# Patient Record
Sex: Male | Born: 1960 | Race: White | Hispanic: No | Marital: Married | Smoking: Current every day smoker
Health system: Southern US, Community
[De-identification: ages and names within clinical notes are randomized; demographics above are authoritative.]

## PROBLEM LIST (undated history)

## (undated) DIAGNOSIS — X838XXA Intentional self-harm by other specified means, initial encounter: Secondary | ICD-10-CM

## (undated) DIAGNOSIS — K649 Unspecified hemorrhoids: Secondary | ICD-10-CM

## (undated) DIAGNOSIS — F32A Depression, unspecified: Secondary | ICD-10-CM

## (undated) DIAGNOSIS — M1990 Unspecified osteoarthritis, unspecified site: Secondary | ICD-10-CM

## (undated) DIAGNOSIS — E039 Hypothyroidism, unspecified: Secondary | ICD-10-CM

## (undated) DIAGNOSIS — F419 Anxiety disorder, unspecified: Secondary | ICD-10-CM

## (undated) DIAGNOSIS — G4733 Obstructive sleep apnea (adult) (pediatric): Secondary | ICD-10-CM

## (undated) DIAGNOSIS — T7840XA Allergy, unspecified, initial encounter: Secondary | ICD-10-CM

## (undated) DIAGNOSIS — R569 Unspecified convulsions: Secondary | ICD-10-CM

## (undated) DIAGNOSIS — R519 Headache, unspecified: Secondary | ICD-10-CM

## (undated) DIAGNOSIS — F329 Major depressive disorder, single episode, unspecified: Secondary | ICD-10-CM

## (undated) DIAGNOSIS — Z9989 Dependence on other enabling machines and devices: Secondary | ICD-10-CM

## (undated) DIAGNOSIS — J189 Pneumonia, unspecified organism: Secondary | ICD-10-CM

## (undated) DIAGNOSIS — I10 Essential (primary) hypertension: Secondary | ICD-10-CM

## (undated) HISTORY — DX: Hypothyroidism, unspecified: E03.9

## (undated) HISTORY — DX: Unspecified convulsions: R56.9

## (undated) HISTORY — DX: Allergy, unspecified, initial encounter: T78.40XA

## (undated) HISTORY — DX: Dependence on other enabling machines and devices: Z99.89

## (undated) HISTORY — DX: Major depressive disorder, single episode, unspecified: F32.9

## (undated) HISTORY — PX: KNEE CARTILAGE SURGERY: SHX688

## (undated) HISTORY — PX: BICEPS TENDON REPAIR: SHX566

## (undated) HISTORY — DX: Essential (primary) hypertension: I10

## (undated) HISTORY — DX: Unspecified hemorrhoids: K64.9

## (undated) HISTORY — PX: KNEE SURGERY: SHX244

## (undated) HISTORY — DX: Depression, unspecified: F41.9

## (undated) HISTORY — DX: Depression, unspecified: F32.A

## (undated) HISTORY — PX: CARPAL TUNNEL RELEASE: SHX101

## (undated) HISTORY — DX: Obstructive sleep apnea (adult) (pediatric): G47.33

---

## 1998-04-29 ENCOUNTER — Ambulatory Visit (HOSPITAL_BASED_OUTPATIENT_CLINIC_OR_DEPARTMENT_OTHER): Admission: RE | Admit: 1998-04-29 | Discharge: 1998-04-29 | Payer: Self-pay | Admitting: *Deleted

## 2004-07-12 ENCOUNTER — Ambulatory Visit (HOSPITAL_COMMUNITY): Admission: RE | Admit: 2004-07-12 | Discharge: 2004-07-12 | Payer: Self-pay | Admitting: Family Medicine

## 2004-07-12 IMAGING — CR DG CHEST 2V
2 series · 2 of 2 positions shown · non-contrast
Comparison: none

HISTORY: Acute bronchitis, cough, fever

CHEST 2 VIEWS:
No prior studies for comparison
Normal heart size, mediastinal contours, and vascularity.
Mild/moderate bronchitic changes without infiltrate or effusion.
No pneumothorax or pulmonary mass seen.
Bones unremarkable.

[view not recorded (1 of 2)]
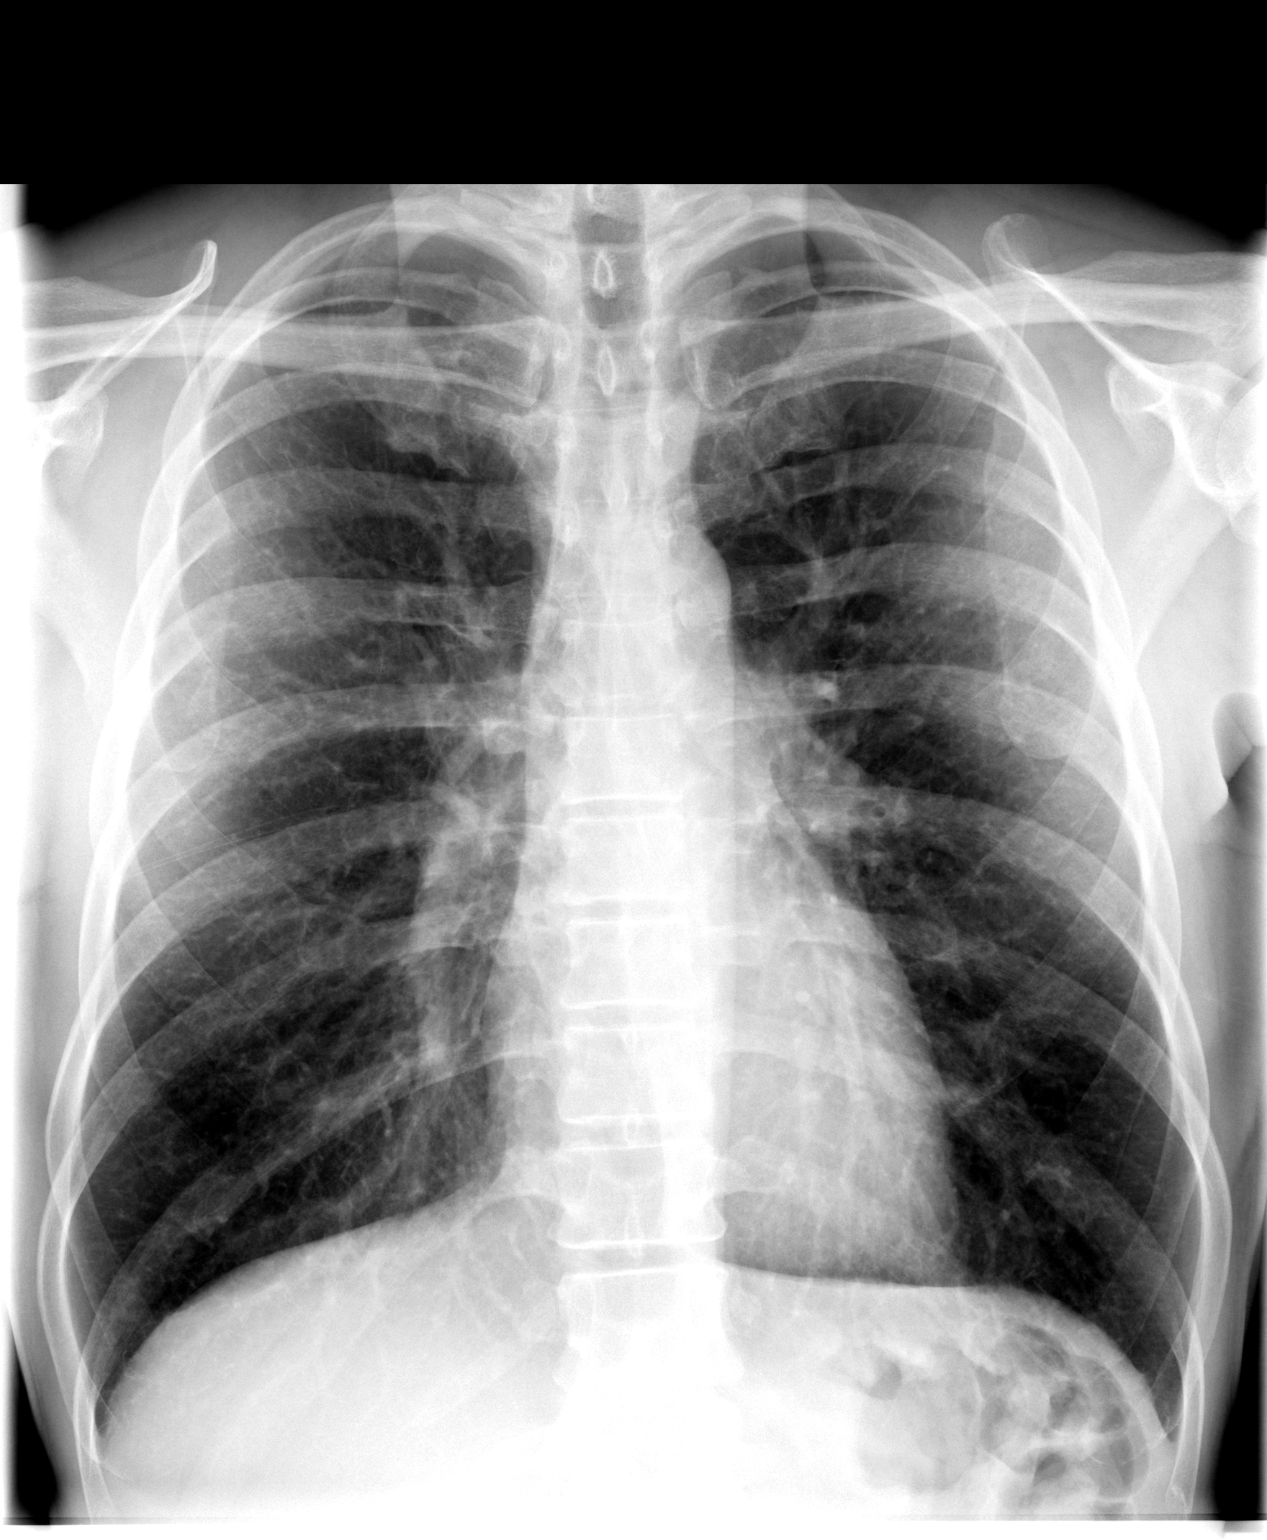

[view not recorded (2 of 2)]
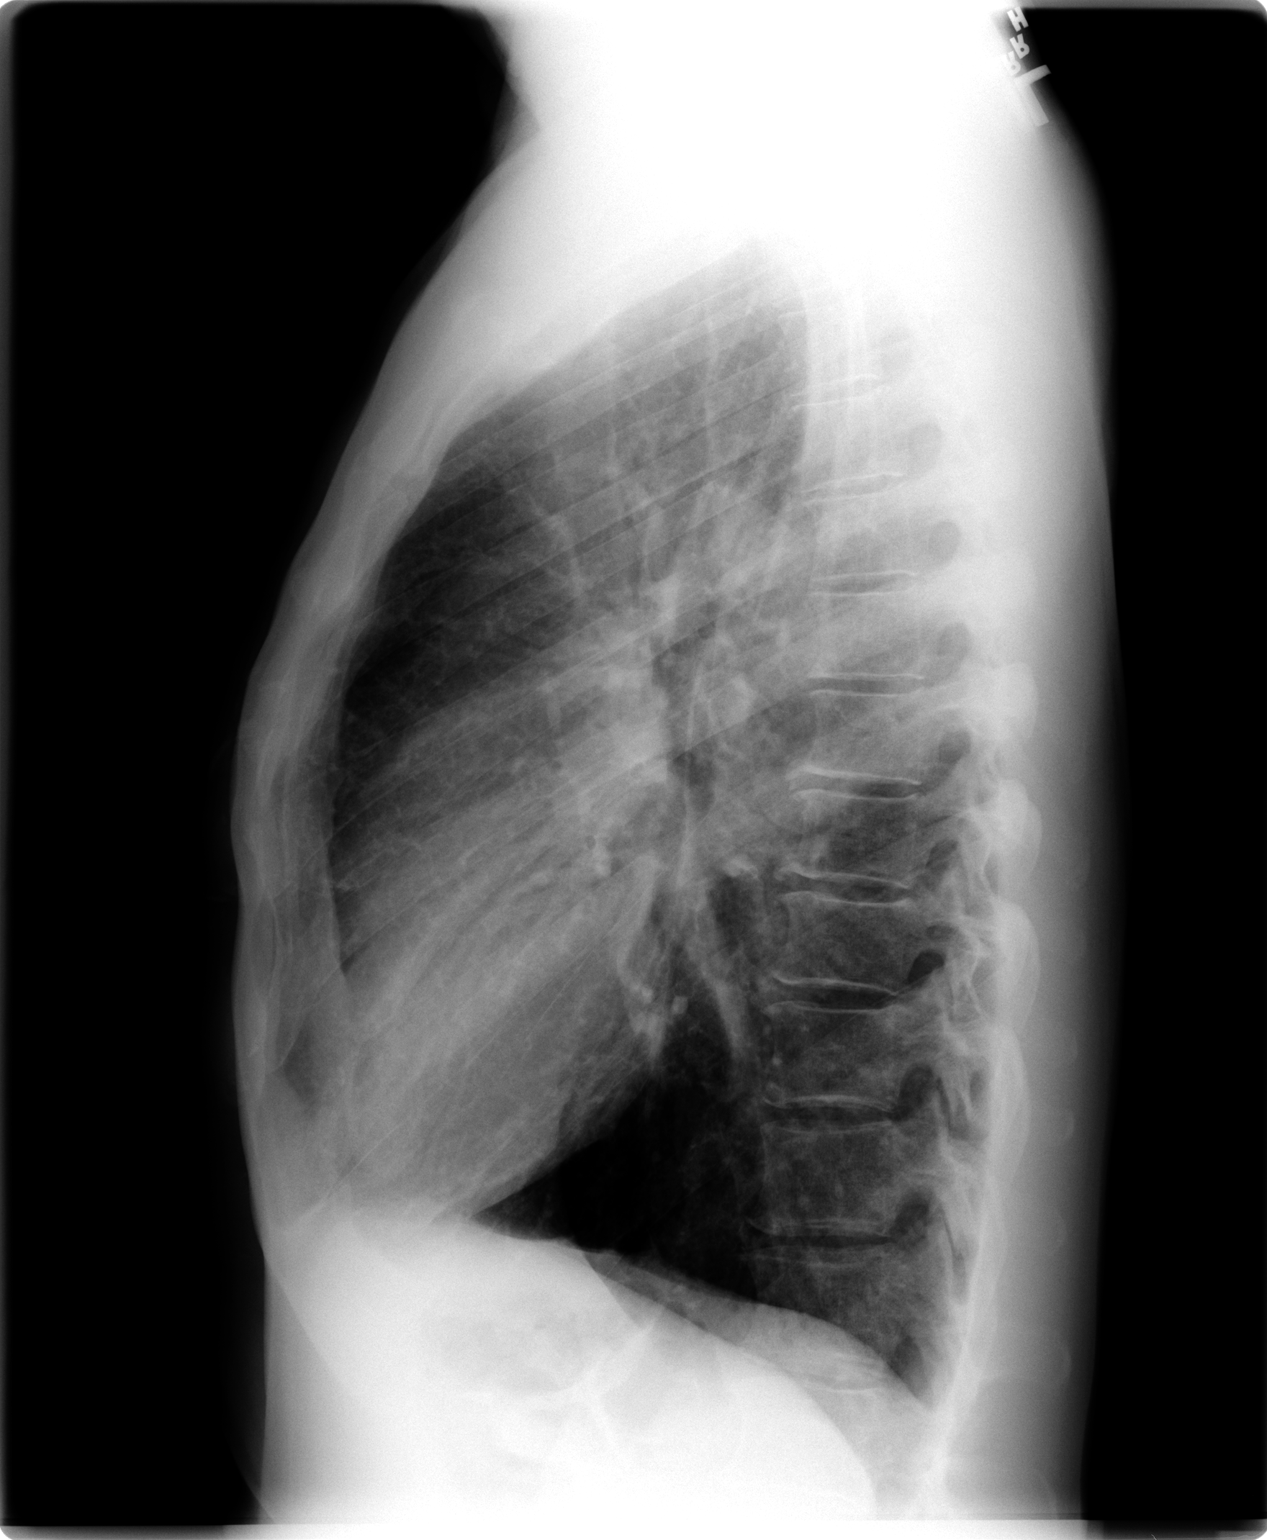

[2 of 2 positions shown; findings below may reference images not displayed]

IMPRESSION: Bronchitic changes.

## 2009-08-16 ENCOUNTER — Ambulatory Visit (HOSPITAL_BASED_OUTPATIENT_CLINIC_OR_DEPARTMENT_OTHER): Admission: RE | Admit: 2009-08-16 | Discharge: 2009-08-16 | Payer: Self-pay | Admitting: Orthopedic Surgery

## 2010-02-04 ENCOUNTER — Ambulatory Visit (HOSPITAL_BASED_OUTPATIENT_CLINIC_OR_DEPARTMENT_OTHER): Admission: RE | Admit: 2010-02-04 | Discharge: 2010-02-04 | Payer: Self-pay | Admitting: Orthopedic Surgery

## 2010-03-01 ENCOUNTER — Encounter (INDEPENDENT_AMBULATORY_CARE_PROVIDER_SITE_OTHER): Payer: Self-pay | Admitting: *Deleted

## 2010-03-04 ENCOUNTER — Encounter (INDEPENDENT_AMBULATORY_CARE_PROVIDER_SITE_OTHER): Payer: Self-pay | Admitting: *Deleted

## 2010-03-07 ENCOUNTER — Ambulatory Visit: Payer: Self-pay | Admitting: Gastroenterology

## 2010-03-14 ENCOUNTER — Ambulatory Visit: Payer: Self-pay | Admitting: Gastroenterology

## 2010-03-17 ENCOUNTER — Encounter: Payer: Self-pay | Admitting: Gastroenterology

## 2010-09-06 NOTE — Letter (Signed)
Summary: Results Letter  [facility]  [address] [place], [place] [address]   Phone: [phone]  Fax: [phone]        March 17, 2010 MRN: [id number]    [name] [address] [facility] [address], [place]  [address]    Dear [name],   At least one of the polyps removed during your recent procedure was proven to be adenomatous.  These are pre-cancerous polyps that may have grown into cancers if they had not been removed.  Based on current nationally recognized surveillance guidelines, I recommend that you have a repeat colonoscopy in 5 years.  We will therefore put your information in our reminder system and will contact you in 5 years to schedule a repeat procedure.  Please call if you have any questions or concerns.       Sincerely,  Rachael Fee MD  This letter has been electronically signed by your physician.  Appended Document: Results Letter letter mailed

## 2010-09-06 NOTE — Miscellaneous (Signed)
Summary: previsit/rm  Clinical Lists Changes  Medications: Added new medication of MOVIPREP 100 GM  SOLR (PEG-KCL-NACL-NASULF-NA ASC-C) As per prep instructions. - Signed Rx of MOVIPREP 100 GM  SOLR (PEG-KCL-NACL-NASULF-NA ASC-C) As per prep instructions.;  #1 x 0;  Signed;  Entered by: Sherren Kerns RN;  Authorized by: Rachael Fee MD;  Method used: [facility]  [place] [address], [place], [place]  [address], Ph: [phone] or [phone], Fax: [phone] Observations: Added new observation of ALLERGY REV: Done (03/07/2010 10:53) Added new observation of NKA: T (03/07/2010 10:53)    Prescriptions: MOVIPREP 100 GM  SOLR (PEG-KCL-NACL-NASULF-NA ASC-C) As per prep instructions.  #1 x 0   Entered by:   Sherren Kerns RN   Authorized by:   Rachael Fee MD   Signed by:   Sherren Kerns RN on 03/07/2010   Method used:   [facility] to        [facility]  [place] [address] (retail)       [address]       [place], [place]  [address]       Ph: [phone] or [phone]       Fax: [phone]   RxID:   [id number]

## 2010-09-06 NOTE — Procedures (Signed)
Summary: Colonoscopy  Patient: [name] Note: All result statuses are Final unless otherwise noted.  Tests: (1) Colonoscopy (COL)   COL Colonoscopy           DONE     [facility]     [address]     [place], [place]  [address]           COLONOSCOPY PROCEDURE REPORT           PATIENT:  [name], [name]  MR#:  [id number]     BIRTHDATE:  07-Apr-1961, 49 yrs. old  GENDER:  male     ENDOSCOPIST:  Rachael Fee, MD     REF. BY:  Talbot Grumbling. Creta Levin, M.D.     PROCEDURE DATE:  03/14/2010     PROCEDURE:  Colonoscopy with biopsy and snare polypectomy     ASA CLASS:  Class II     INDICATIONS:  Routine Risk Screening     MEDICATIONS:   Fentanyl 75 mcg IV, Versed 6 mg IV           DESCRIPTION OF PROCEDURE:   After the risks benefits and     alternatives of the procedure were thoroughly explained, informed     consent was obtained.  Digital rectal exam was performed and     revealed no rectal masses.   The LB [id number] endoscope     was introduced through the anus and advanced to the cecum, which     was identified by both the appendix and ileocecal valve, without     limitations.  The quality of the prep was good, using MoviPrep.     The instrument was then slowly withdrawn as the colon was fully     examined.           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  There were     multiple polyps identified and removed. Two sessile polyps were     found, removed from colon. One was 1-68mm across, located in cecum,     removed with forceps. The other was 5mm across, located in     ascending colon, removed with cold snare. Both were sent to     pathology (jar 1) (see image4 and image2).  External hemorrhoids     were found.  This was otherwise a normal examination of the colon     (see image1, image3, and image6).   Retroflexed views in the     rectum revealed no abnormalities.    The scope was then withdrawn     from the  patient and the procedure completed.           COMPLICATIONS:  None           ENDOSCOPIC IMPRESSION:     1) Melanosis staining of colon mucosa     2) Two small polyps, both removed and sent to pathology     3) External hemorrhoids     4) Otherwise normal examination           RECOMMENDATIONS:     1) If the polyp(s) removed today are proven to be adenomatous     (pre-cancerous) polyps, you will need a repeat colonoscopy in 5     years. Otherwise you should continue to follow colorectal cancer     screening guidelines for "routine risk" patients with colonoscopy     in 10 years.  2) You will receive a letter within 1-2 weeks with the results     of your biopsy as well as final recommendations. Please call my     office if you have not received a letter after 3 weeks.           ______________________________     Rachael Fee, MD           n.     eSIGNED:   Rachael Fee at 03/14/2010 03:06 PM           Raquel James, [id number]  Note: An exclamation mark (!) indicates a result that was not dispersed into the flowsheet. Document Creation Date: 03/14/2010 3:07 PM _______________________________________________________________________  (1) Order result status: Final Collection or observation date-time: 03/14/2010 15:01 Requested date-time:  Receipt date-time:  Reported date-time:  Referring Physician:   Ordering Physician: Rob Bunting [phone]) Specimen Source:  Source: Launa Grill Order Number: [id number] Lab site:   Appended Document: Colonoscopy     Procedures Next Due Date:    Colonoscopy: 03/2015

## 2010-09-06 NOTE — Letter (Signed)
Summary: [facility] Instructions  [facility]  [address] [place], [place] [address]   Phone: [phone]  Fax: [phone]       [name]    1960/11/03    MRN: [id number]        Procedure Day Dorna Bloom:  Duanne Limerick  03/14/10     Arrival Time:  2:00PM     Procedure Time:  3:00PM     Location of Procedure:                    _X_  [facility] (4th Floor)                       PREPARATION FOR COLONOSCOPY WITH MOVIPREP   Starting 5 days prior to your procedure 03/09/10 do not eat nuts, seeds, popcorn, corn, beans, peas,  salads, or any raw vegetables.  Do not take any fiber supplements (e.g. Metamucil, Citrucel, and Benefiber).  THE DAY BEFORE YOUR PROCEDURE         DATE: 03/13/10  DAY: SUNDAY  1.  Drink clear liquids the entire day-NO SOLID FOOD  2.  Do not drink anything colored red or purple.  Avoid juices with pulp.  No orange juice.  3.  Drink at least 64 oz. (8 glasses) of fluid/clear liquids during the day to prevent dehydration and help the prep work efficiently.  CLEAR LIQUIDS INCLUDE: Water Jello Ice Popsicles Tea (sugar ok, no milk/cream) Powdered fruit flavored drinks Coffee (sugar ok, no milk/cream) Gatorade Juice: apple, white grape, white cranberry  Lemonade Clear bullion, consomm, broth Carbonated beverages (any kind) Strained chicken noodle soup Hard Candy                             4.  In the morning, mix first dose of MoviPrep solution:    Empty 1 Pouch A and 1 Pouch B into the disposable container    Add lukewarm drinking water to the top line of the container. Mix to dissolve    Refrigerate (mixed solution should be used within 24 hrs)  5.  Begin drinking the prep at 5:00 p.m. The MoviPrep container is divided by 4 marks.   Every 15 minutes drink the solution down to the next mark (approximately 8 oz) until the full liter is complete.   6.  Follow completed prep with 16 oz of clear liquid of your choice (Nothing red  or purple).  Continue to drink clear liquids until bedtime.  7.  Before going to bed, mix second dose of MoviPrep solution:    Empty 1 Pouch A and 1 Pouch B into the disposable container    Add lukewarm drinking water to the top line of the container. Mix to dissolve    Refrigerate  THE DAY OF YOUR PROCEDURE      DATE: 03/14/10  DAY: MONDAY  Beginning at 10:00AM (5 hours before procedure):         1. Every 15 minutes, drink the solution down to the next mark (approx 8 oz) until the full liter is complete.  2. Follow completed prep with 16 oz. of clear liquid of your choice.    3. You may drink clear liquids until 1:00PM (2 HOURS BEFORE PROCEDURE).   MEDICATION INSTRUCTIONS  Unless otherwise instructed, you should take regular prescription medications with a small sip of water   as early as possible the morning of your  procedure.   Additional medication instructions:  n/a         OTHER INSTRUCTIONS  You will need a responsible adult at least 52 years of age to accompany you and drive you home.   This person must remain in the waiting room during your procedure.  Wear loose fitting clothing that is easily removed.  Leave jewelry and other valuables at home.  However, you may wish to bring a book to read or  an iPod/MP3 player to listen to music as you wait for your procedure to start.  Remove all body piercing jewelry and leave at home.  Total time from sign-in until discharge is approximately 2-3 hours.  You should go home directly after your procedure and rest.  You can resume normal activities the  day after your procedure.  The day of your procedure you should not:   Drive   Make legal decisions   Operate machinery   Drink alcohol   Return to work  You will receive specific instructions about eating, activities and medications before you leave.    The above instructions have been reviewed and explained to me by   Sherren Kerns RN  March 07, 2010 11:19  AM    I fully understand and can verbalize these instructions _____________________________ Date _________

## 2010-09-06 NOTE — Letter (Signed)
Summary: Previsit letter  [facility]  [address] [place], [place] [address]   Phone: [phone]  Fax: [phone]       03/01/2010 MRN: [id number]  [name] [address] [place], [place]  [address]  Dear Mr. Boulay,  Welcome to the Gastroenterology Division at [facility].    You are scheduled to see a nurse for your pre-procedure visit on 03/07/2010 at 11:00AM on the 3rd floor at [facility], [address]  We ask that you try to arrive at our office 15 minutes prior to your appointment time to allow for check-in.  Your nurse visit will consist of discussing your medical and surgical history, your immediate family medical history, and your medications.    Please bring a complete list of all your medications or, if you prefer, bring the medication bottles and we will list them.  We will need to be aware of both prescribed and over the counter drugs.  We will need to know exact dosage information as well.  If you are on blood thinners (Coumadin, Plavix, Aggrenox, Ticlid, etc.) please call our office today/prior to your appointment, as we need to consult with your physician about holding your medication.   Please be prepared to read and sign documents such as consent forms, a financial agreement, and acknowledgement forms.  If necessary, and with your consent, a friend or relative is welcome to sit-in on the nurse visit with you.  Please bring your insurance card so that we may make a copy of it.  If your insurance requires a referral to see a specialist, please bring your referral form from your primary care physician.  No co-pay is required for this nurse visit.     If you cannot keep your appointment, please call [phone] to cancel or reschedule prior to your appointment date.  This allows [place] the opportunity to schedule an appointment for another patient in need of care.    Thank you for choosing [facility] for your medical needs.   We appreciate the opportunity to care for you.  Please visit [place] at our website  to learn more about our practice.                     Sincerely.                                                                                                                   The Gastroenterology Division

## 2010-09-20 ENCOUNTER — Encounter (HOSPITAL_BASED_OUTPATIENT_CLINIC_OR_DEPARTMENT_OTHER)
Admission: RE | Admit: 2010-09-20 | Discharge: 2010-09-20 | Disposition: A | Discharge status: Home / Self Care | Payer: Commercial Managed Care - PPO | Source: Ambulatory Visit | Attending: Orthopedic Surgery | Admitting: Orthopedic Surgery

## 2010-09-20 DIAGNOSIS — Z01810 Encounter for preprocedural cardiovascular examination: Secondary | ICD-10-CM | POA: Insufficient documentation

## 2010-09-21 ENCOUNTER — Ambulatory Visit (HOSPITAL_BASED_OUTPATIENT_CLINIC_OR_DEPARTMENT_OTHER)
Admission: RE | Admit: 2010-09-21 | Discharge: 2010-09-21 | Disposition: A | Discharge status: Home / Self Care | Payer: Commercial Managed Care - PPO | Source: Ambulatory Visit | Attending: Orthopedic Surgery | Admitting: Orthopedic Surgery

## 2010-09-21 DIAGNOSIS — M65839 Other synovitis and tenosynovitis, unspecified forearm: Secondary | ICD-10-CM | POA: Insufficient documentation

## 2010-09-21 DIAGNOSIS — M249 Joint derangement, unspecified: Secondary | ICD-10-CM | POA: Insufficient documentation

## 2010-09-21 DIAGNOSIS — Z01812 Encounter for preprocedural laboratory examination: Secondary | ICD-10-CM | POA: Insufficient documentation

## 2010-09-26 NOTE — Op Note (Signed)
[name], [name]             ACCOUNT NO.:  [id number]  MEDICAL RECORD NO.:  [id number]          PATIENT TYPE:  LOCATION:                                 FACILITY:  PHYSICIAN:  Cindee Salt, M.D.            DATE OF BIRTH:  DATE OF PROCEDURE:  09/21/2010 DATE OF DISCHARGE:                              OPERATIVE REPORT   PREOPERATIVE DIAGNOSIS:  Stenosing tenosynovitis, right thumb, ruptured distal biceps, right elbow.  POSTOPERATIVE DIAGNOSIS:  Stenosing tenosynovitis, right thumb, ruptured distal biceps, right elbow.  OPERATION:  Release A1 pulley, right thumb with reattachment biceps tendon, right elbow using Arthrex or RetroButton.  SURGEON:  Cindee Salt, MD  ASSISTANT:  Artist Pais. Mina Marble, MD  ANESTHESIA:  Axillary general.  ANESTHESIOLOGIST:  Dr.  Gelene Mink.  HISTORY:  The patient is a 50 year old male with history of triggering of his right thumb.  He suffered an injury to his right elbow, complains of pain in the anterior aspect along the biceps tendon with weakness to supination and flexion of his elbow.  MRI reveals a partial rupture of the attachment of the biceps tendon distally.  We have discussed possibility of surgical repair versus nonoperative treatment.  He has had continued pain despite immobilization and desirous of proceeding to have this repaired.  His postoperative course has been discussed along with risks and complications.  He is aware that there is no guarantee with surgery, possibility of infection, recurrence injury to arteries, nerves, tendons, incomplete relief of symptoms, dystrophy.  In the preoperative area, the patient was seen.  The extremity was marked by both the patient and surgeon.  Antibiotic was given.  PROCEDURE:  The patient was brought to the operating room where a general anesthetic was carried out without difficulty after an axillary block was carried out without difficulty.  He was prepped and draped in supine position  with ChloraPrep, 3-minute dry time was allowed.  Time- out taken confirming the patient and procedure.  The limb was exsanguinated with an Esmarch bandage.  Tourniquet placed high and the arm was inflated to 250 mmHg.  A transverse incision was made over the A1 pulley of the right thumb, carried down through subcutaneous tissue. Neurovascular structures identified and protected.  The A1 pulley was then released in its radial aspect protecting the neurovascular structures on the radial side.  The oblique pulley was left intact.  A thumb placed through full range motion, no further triggering was evident.  The wound was irrigated.  Skin was closed with interrupted 4-0 Vicryl Rapide sutures.  A separate incision was then made transversely approximately two fingerbreadths distal to the elbow crease, carried down through subcutaneous tissue.  Bleeders were electrocauterized with bipolar.  The median nerve was identified, brachial artery and brachial vein were identified, these were retracted ulnarly.  The biceps tendon was then easily identified.  This was traced down to the insertion on the radial tubercle.  This had been significantly detached with approximately 10% portion still left intact.  The area was then roughened, an Arthrex guide pin was then inserted with the elbow and forearm in full  supination.  The drill hole was made all the way through with the guide pin, a 7 mm rasp was then used to enlarge the hole proximally.  The guide pin was left in place, a whip stitch was then placed with 2-0 FiberWire.  The area the bone was roughened with a curette and rongeur.  The suture was then passed through the proximal tendon inserted through the RetroButton.  The inserter was then placed through the hole.  This allowed the RetroButton to be reversed and gripping the opposite cortex.  X-rays confirmed that it was directly on the cortex, did not appear to have any soft tissue interspersed.   The suture was then tightened pulling the biceps tendon into the defect created with the 7-mm rasp.  This was done after irrigation and removal of loose bone.  The suture was then passed through the tendon again and tied firmly fixing the arm in with reattachment of the distal biceps, full flexion and extension, pronation and supination were allowed.  The wound was copiously irrigated with saline.  The subcutaneous tissue closed interrupted 4-0 Vicryl and the skin with a subcuticular 4-0 Vicryl Rapide suture.  A sterile compressive dressing, long-arm splint with the elbow in neutral position with the elbow flexed to 90 degrees was applied.  Deflation of the tourniquet, all fingers immediately pinked.  He was taken to the recovery room for observation in satisfactory condition.  He will be discharged home to return to the [facility] [place] in 1 week, on Percocet.          ______________________________ Cindee Salt, M.D.     GK/MEDQ  D:  09/21/2010  T:  09/22/2010  Job:  [id number]  Electronically Signed by Cindee Salt M.D. on 09/26/2010 02:26:29 PM

## 2010-10-23 LAB — POCT HEMOGLOBIN-HEMACUE
Hemoglobin: 14.9 g/dL (ref 13.0–17.0)
Hemoglobin: 14.9 g/dL (ref 13.0–17.0)

## 2011-12-18 ENCOUNTER — Encounter (HOSPITAL_COMMUNITY): Payer: Self-pay | Admitting: Emergency Medicine

## 2011-12-18 ENCOUNTER — Emergency Department (HOSPITAL_COMMUNITY): Payer: Commercial Managed Care - PPO

## 2011-12-18 ENCOUNTER — Emergency Department (HOSPITAL_COMMUNITY)
Admission: EM | Admit: 2011-12-18 | Discharge: 2011-12-18 | Disposition: A | Discharge status: Home / Self Care | Payer: Commercial Managed Care - PPO | Attending: Emergency Medicine | Admitting: Emergency Medicine

## 2011-12-18 DIAGNOSIS — N201 Calculus of ureter: Secondary | ICD-10-CM | POA: Insufficient documentation

## 2011-12-18 DIAGNOSIS — R1031 Right lower quadrant pain: Secondary | ICD-10-CM | POA: Insufficient documentation

## 2011-12-18 DIAGNOSIS — E039 Hypothyroidism, unspecified: Secondary | ICD-10-CM | POA: Insufficient documentation

## 2011-12-18 HISTORY — DX: Hypothyroidism, unspecified: E03.9

## 2011-12-18 LAB — COMPREHENSIVE METABOLIC PANEL
ALT: 12 U/L (ref 0–53)
Albumin: 3.9 g/dL (ref 3.5–5.2)
Alkaline Phosphatase: 79 U/L (ref 39–117)
BUN: 17 mg/dL (ref 6–23)
CO2: 31 mEq/L (ref 19–32)
Calcium: 9.8 mg/dL (ref 8.4–10.5)
Chloride: 103 mEq/L (ref 96–112)
Creatinine, Ser: 1.07 mg/dL (ref 0.50–1.35)
GFR calc Af Amer: >90 mL/min (ref >90)
GFR calc non Af Amer: 79 mL/min — ABNORMAL LOW (ref >90)
Glucose, Bld: 101 mg/dL — ABNORMAL HIGH (ref 70–99)
Potassium: 4 mEq/L (ref 3.5–5.1)
Sodium: 142 mEq/L (ref 135–145)
Total Bilirubin: 0.1 mg/dL — ABNORMAL LOW (ref 0.3–1.2)

## 2011-12-18 LAB — URINALYSIS, ROUTINE W REFLEX MICROSCOPIC
Glucose, UA: NEGATIVE mg/dL
Ketones, ur: 15 mg/dL — AB
Nitrite: NEGATIVE
Protein, ur: 100 mg/dL — AB
Specific Gravity, Urine: 1.028 (ref 1.005–1.030)
Urobilinogen, UA: 1 mg/dL (ref 0.0–1.0)
pH: 5.5 (ref 5.0–8.0)

## 2011-12-18 LAB — URINE MICROSCOPIC-ADD ON

## 2011-12-18 LAB — CBC
HCT: 41.6 % (ref 39.0–52.0)
Hemoglobin: 14.3 g/dL (ref 13.0–17.0)
MCH: 32.9 pg (ref 26.0–34.0)
MCHC: 34.4 g/dL (ref 30.0–36.0)
MCV: 95.9 fL (ref 78.0–100.0)
Platelets: 242 10*3/uL (ref 150–400)
RBC: 4.34 MIL/uL (ref 4.22–5.81)
RDW: 13.8 % (ref 11.5–15.5)
WBC: 18.2 10*3/uL — ABNORMAL HIGH (ref 4.0–10.5)

## 2011-12-18 LAB — DIFFERENTIAL
Band Neutrophils: 0 % (ref 0–10)
Basophils Absolute: 0 10*3/uL (ref 0.0–0.1)
Basophils Relative: 0 % (ref 0–1)
Blasts: 0 %
Eosinophils Absolute: 0 10*3/uL (ref 0.0–0.7)
Lymphs Abs: 5.8 10*3/uL — ABNORMAL HIGH (ref 0.7–4.0)
Metamyelocytes Relative: 0 %
Monocytes Absolute: 0.9 10*3/uL (ref 0.1–1.0)
Monocytes Relative: 5 % (ref 3–12)
Myelocytes: 0 %
Neutro Abs: 11.5 10*3/uL — ABNORMAL HIGH (ref 1.7–7.7)
Neutrophils Relative %: 63 % (ref 43–77)
Promyelocytes Absolute: 0 %
nRBC: 0 /100 WBC

## 2011-12-18 LAB — LIPASE, BLOOD: Lipase: 29 U/L (ref 11–59)

## 2011-12-18 IMAGING — CT CT ABD-PELV W/ CM
1 of 3 series · 14 of 32 positions shown, 19 images · IV contrast (APPLIED)
Comparison: None.

CLINICAL DATA: Right lower quadrant abdominal pain started this
evening.

CT ABDOMEN AND PELVIS WITH CONTRAST
TECHNIQUE: Multidetector CT imaging of the abdomen and pelvis was
performed following the standard protocol during bolus
administration of intravenous contrast.
Contrast: 1 OMNIPAQUE IOHEXOL 300 MG/ML  SOLN, 100mL OMNIPAQUE
IOHEXOL 300 MG/ML  SOLN

[Series 2: abd/pelv with 5.0 b31f st · axial · 0.82mm/px · z∈[-115,+290]mm · 14 of 93 slices shown, 19 images]
[im 6/93  soft-tissue]
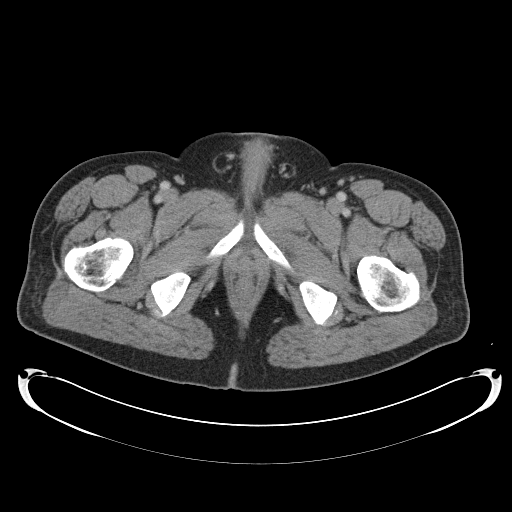
[im 6/93  bone]
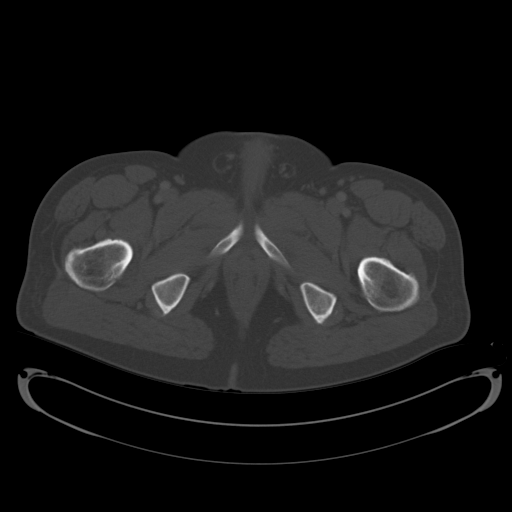
[im 11/93  soft-tissue]
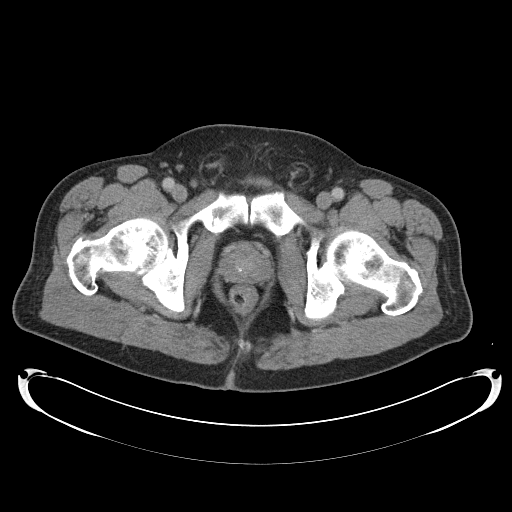
[im 21/93  soft-tissue]
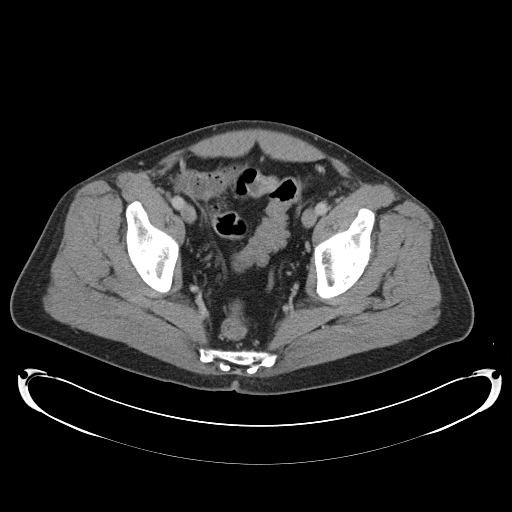
[im 26/93  soft-tissue]
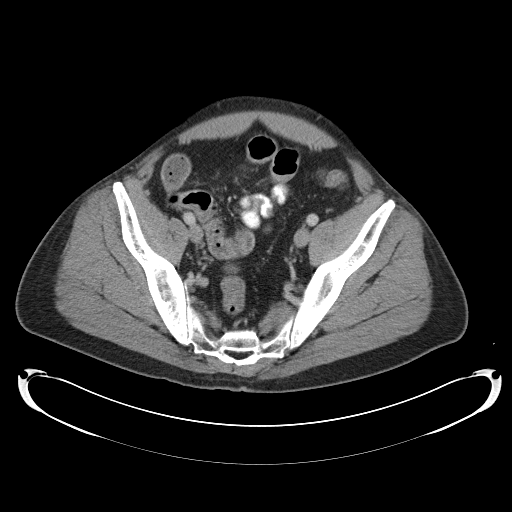
[im 31/93  soft-tissue]
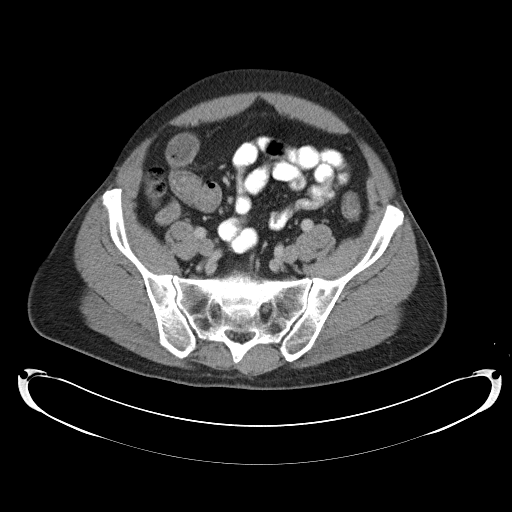
[im 41/93  soft-tissue]
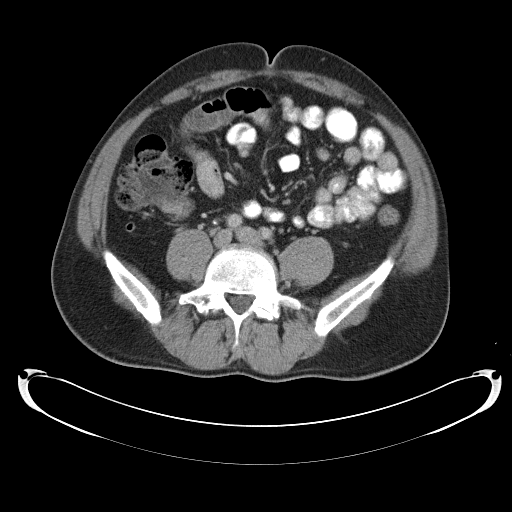
[im 47/93  soft-tissue]
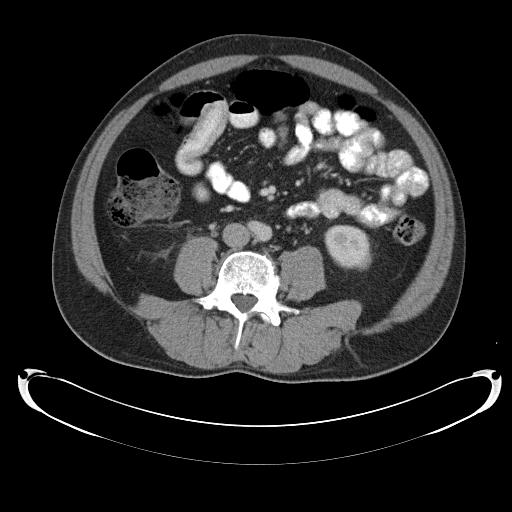
[im 52/93  soft-tissue]
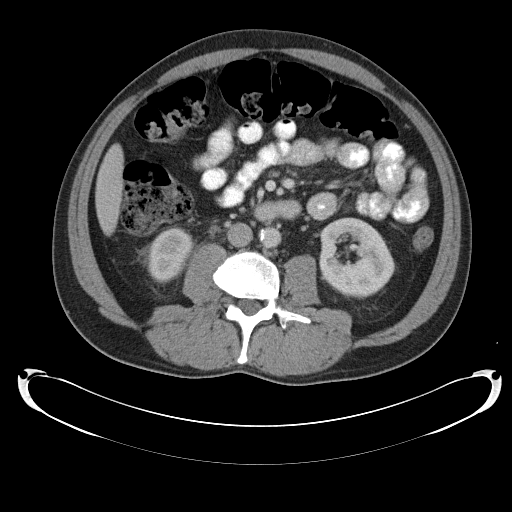
[im 62/93  soft-tissue]
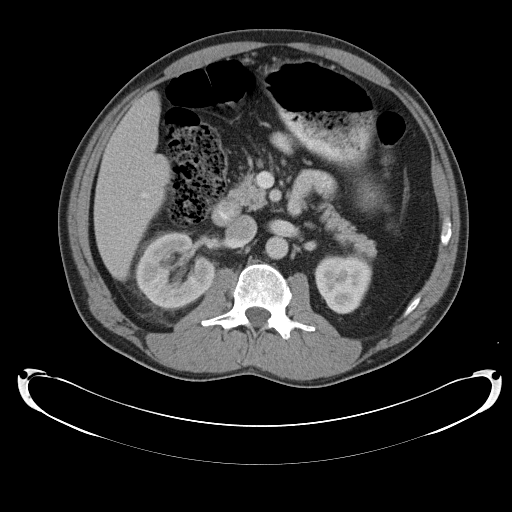
[im 62/93  bone]
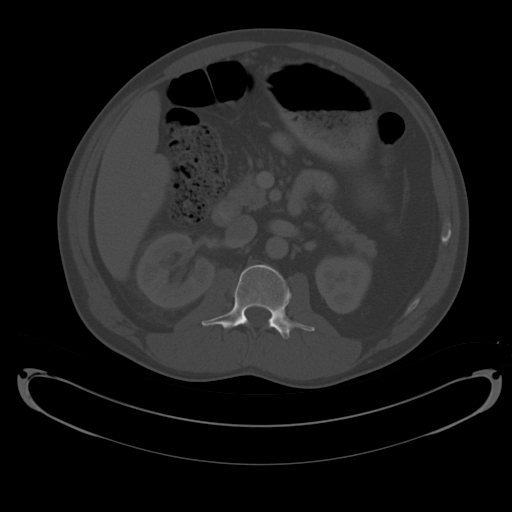
[im 67/93  soft-tissue]
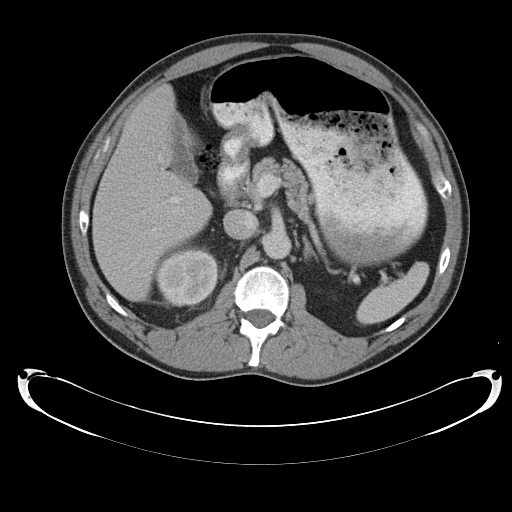
[im 72/93  soft-tissue]
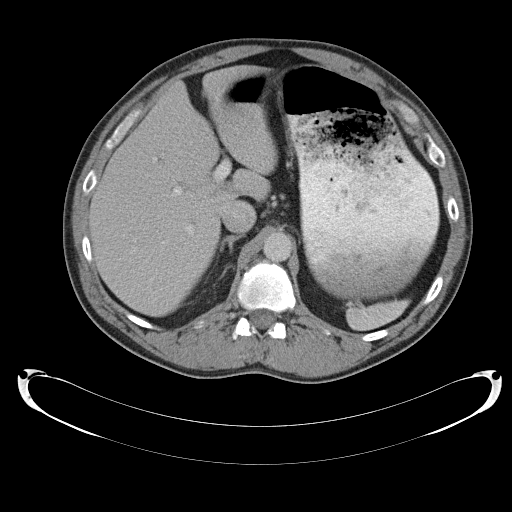
[im 72/93  lung]
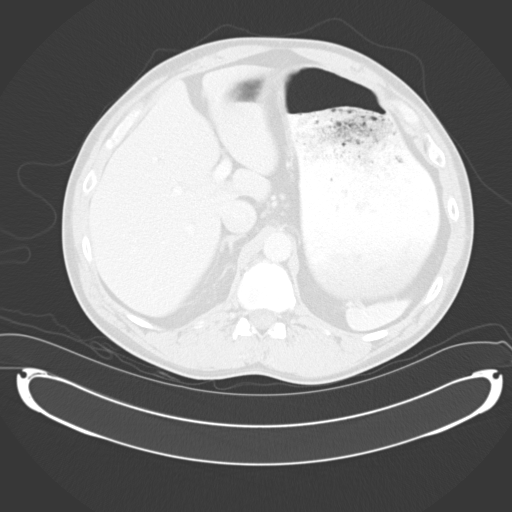
[im 77/93  lung]
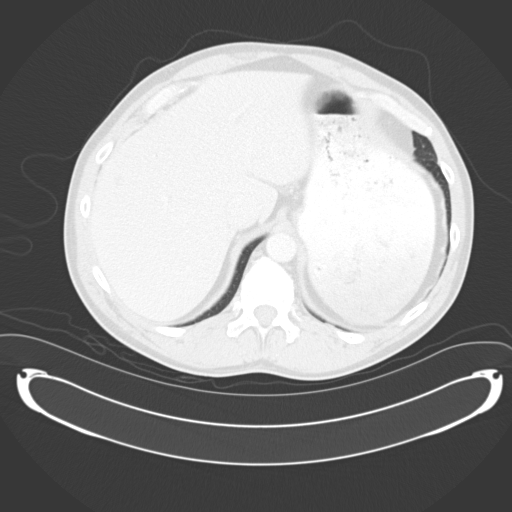
[im 82/93  soft-tissue]
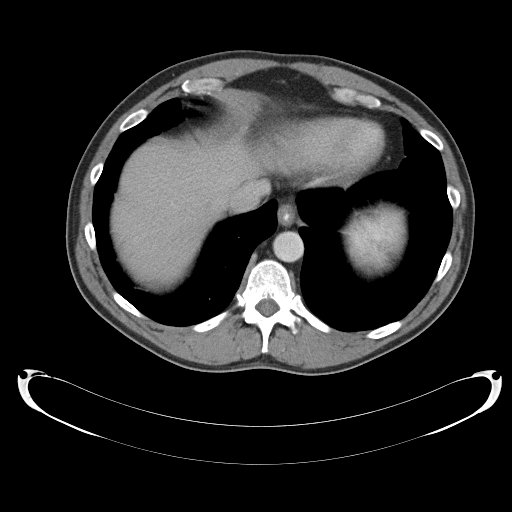
[im 82/93  lung]
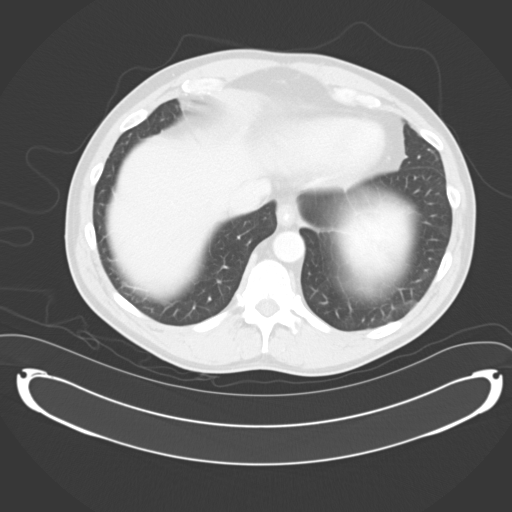
[im 87/93  soft-tissue]
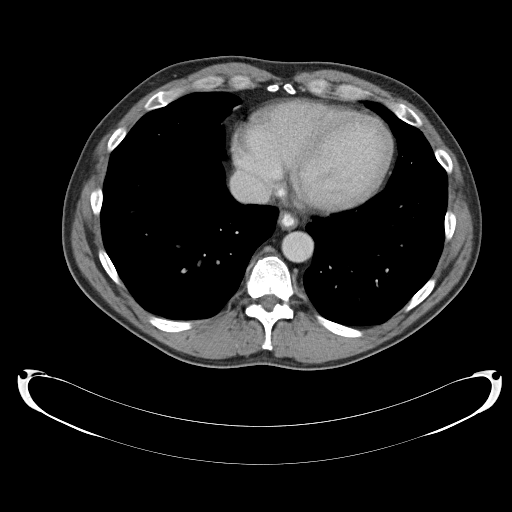
[im 87/93  lung]
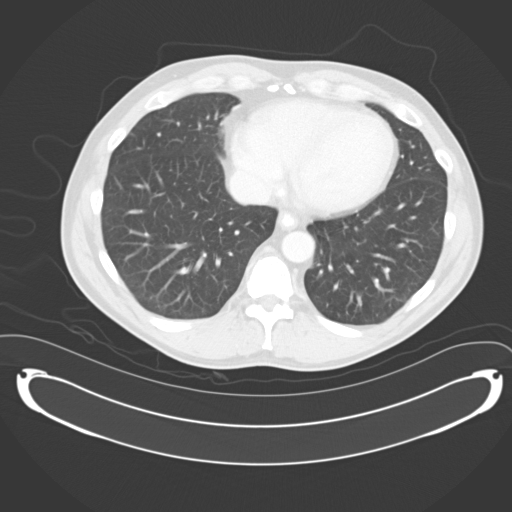

[14 of 32 positions shown; findings below may reference images not displayed]

FINDINGS: The lung bases are clear.

There is a 5 mm stone in the mid right ureter at the level of the
L4-5 interspace.  There is proximal ureterectasis and
pyelocaliectasis with pararenal and periureteral stranding
consistent with moderate obstruction. Associated delay in
appearance of contrast material in the right renal collecting
system with respect to the left side.  Small intrarenal
nonobstructing stones demonstrated on the right.  No stone or
obstruction in the left kidney.

The liver, spleen, gallbladder, pancreas, adrenal glands, and
retroperitoneal lymph nodes are unremarkable.  Calcification of the
aorta without aneurysm.  The gastric wall is not thickened.  Small
bowel are not dilated.  Stool filled colon without distension.  No
free fluid or free air in the abdomen.

Pelvis:  The bladder wall is not thickened.  Calcification in the
prostate gland.  No inflammatory changes in the sigmoid colon.  The
appendix is normal.  No free or loculated pelvic fluid collections.
Degenerative changes in the spine.
IMPRESSION: 5 mm stone in the mid right ureter with moderate proximal
obstruction.  Additional nonobstructing intrarenal stones on the
right.  Normal appendix.

## 2011-12-18 MED ORDER — HYDROMORPHONE HCL PF 1 MG/ML IJ SOLN
1.0000 mg | Freq: Once | INTRAMUSCULAR | Status: AC
  Administered 2011-12-18: 1 mg via INTRAVENOUS
  Filled 2011-12-18: qty 1

## 2011-12-18 MED ORDER — KETOROLAC TROMETHAMINE 30 MG/ML IJ SOLN
30.0000 mg | Freq: Once | INTRAMUSCULAR | Status: AC
  Administered 2011-12-18: 30 mg via INTRAVENOUS
  Filled 2011-12-18: qty 1

## 2011-12-18 MED ORDER — ONDANSETRON 8 MG PO TBDP: 8.0000 mg | ORAL_TABLET | Freq: Three times a day (TID) | ORAL | Status: AC | PRN

## 2011-12-18 MED ORDER — SODIUM CHLORIDE 0.9 % IV SOLN
Freq: Once | INTRAVENOUS | Status: AC
  Administered 2011-12-18: 03:00:00 via INTRAVENOUS

## 2011-12-18 MED ORDER — IOHEXOL 300 MG/ML  SOLN
100.0000 mL | Freq: Once | INTRAMUSCULAR | Status: AC | PRN
  Administered 2011-12-18: 100 mL via INTRAVENOUS

## 2011-12-18 MED ORDER — SODIUM CHLORIDE 0.9 % IV BOLUS (SEPSIS)
1000.0000 mL | Freq: Once | INTRAVENOUS | Status: AC
  Administered 2011-12-18: 1000 mL via INTRAVENOUS

## 2011-12-18 MED ORDER — IBUPROFEN 600 MG PO TABS: 600.0000 mg | ORAL_TABLET | Freq: Three times a day (TID) | ORAL | Status: AC | PRN

## 2011-12-18 MED ORDER — TAMSULOSIN HCL 0.4 MG PO CAPS: 0.4000 mg | ORAL_CAPSULE | Freq: Every day | ORAL | Status: DC

## 2011-12-18 MED ORDER — ONDANSETRON HCL 4 MG/2ML IJ SOLN
4.0000 mg | Freq: Once | INTRAMUSCULAR | Status: AC
  Administered 2011-12-18: 4 mg via INTRAVENOUS
  Filled 2011-12-18: qty 2

## 2011-12-18 MED ORDER — IOHEXOL 300 MG/ML  SOLN
20.0000 mL | INTRAMUSCULAR | Status: AC
  Administered 2011-12-18 (×2): 20 mL via ORAL

## 2011-12-18 MED ORDER — OXYCODONE-ACETAMINOPHEN 5-325 MG PO TABS: 1.0000 | ORAL_TABLET | ORAL | Status: AC | PRN

## 2011-12-18 NOTE — Discharge Instructions (Signed)
Ureteral Colic (Kidney Stones) Ureteral colic is the result of a condition when kidney stones form inside the kidney. Once kidney stones are formed they may move into the tube that connects the kidney with the bladder (ureter). If this occurs, this condition may cause pain (colic) in the ureter.  CAUSES  Pain is caused by stone movement in the ureter and the obstruction caused by the stone. SYMPTOMS  The pain comes and goes as the ureter contracts around the stone. The pain is usually intense, sharp, and stabbing in character. The location of the pain may move as the stone moves through the ureter. When the stone is near the kidney the pain is usually located in the back and radiates to the belly (abdomen). When the stone is ready to pass into the bladder the pain is often located in the lower abdomen on the side the stone is located. At this location, the symptoms may mimic those of a urinary tract infection with urinary frequency. Once the stone is located here it often passes into the bladder and the pain disappears completely. TREATMENT   Your caregiver will provide you with medicine for pain relief.   You may require specialized follow-up X-rays.   The absence of pain does not always mean that the stone has passed. It may have just stopped moving. If the urine remains completely obstructed, it can cause loss of kidney function or even complete destruction of the involved kidney. It is your responsibility and in your interest that X-rays and follow-ups as suggested by your caregiver are completed. Relief of pain without passage of the stone can be associated with severe damage to the kidney, including loss of kidney function on that side.   If your stone does not pass on its own, additional measures may be taken by your caregiver to ensure its removal.  HOME CARE INSTRUCTIONS   Increase your fluid intake. Water is the preferred fluid since juices containing vitamin C may acidify the urine making  it less likely for certain stones (uric acid stones) to pass.   Strain all urine. A strainer will be provided. Keep all particulate matter or stones for your caregiver to inspect.   Take your pain medicine as directed.   Make a follow-up appointment with your caregiver as directed.   Remember that the goal is passage of your stone. The absence of pain does not mean the stone is gone. Follow your caregiver's instructions.   Only take over-the-counter or prescription medicines for pain, discomfort, or fever as directed by your caregiver.  SEEK MEDICAL CARE IF:   Pain cannot be controlled with the prescribed medicine.   You have a fever.   Pain continues for longer than your caregiver advises it should.   There is a change in the pain, and you develop chest discomfort or constant abdominal pain.   You feel faint or pass out.  MAKE SURE YOU:   Understand these instructions.   Will watch your condition.   Will get help right away if you are not doing well or get worse.  Document Released: 05/03/2005 Document Revised: 07/13/2011 Document Reviewed: 01/18/2011 [employer] Patient Information 2012 [employer]. 

## 2011-12-18 NOTE — ED Notes (Signed)
Patient finished oral contrast 

## 2011-12-18 NOTE — ED Provider Notes (Signed)
History     CSN: [id number]  Arrival date & time 12/18/11  0001   First MD Initiated Contact with Patient 12/18/11 [phone]      Chief Complaint  Patient presents with  . Abdominal Pain     The history is provided by the patient.   the patient reports constant and worsening right lower quadrant abdominal pain that began this evening.  He reports his pain is moderate to severe at this time.  He denies nausea vomiting or diarrhea.  He denies flank pain.  He has no dysuria or urinary frequency.  He does report having urinary tract infection/kidney infection approximately 2 and half ago.  He denies fevers and chills.  He reports his pain continued to worsen to the evening which is what brought him to the emergency department.  He's otherwise never had pain like this before  Past Medical History  Diagnosis Date  . Hypothyroidism     Past Surgical History  Procedure Date  . Carpal tunnel release     No family history on file.  History  Substance Use Topics  . Smoking status: Current Everyday Smoker  . Smokeless tobacco: Not on file  . Alcohol Use: No      Review of Systems  Gastrointestinal: Positive for abdominal pain.  All other systems reviewed and are negative.    Allergies  Review of patient's allergies indicates no known allergies.  Home Medications   Medications: synthroid, prednisone, chondroitin sulfate  BP 131/80  Pulse 78  Temp(Src) 97.7 F (36.5 C) (Oral)  Resp 17  Ht 5' 8.5" (1.74 m)  Wt 175 lb (79.379 kg)  BMI 26.22 kg/m2  SpO2 95%  Physical Exam  Nursing note and vitals reviewed. Constitutional: He is oriented to person, place, and time. He appears well-developed and well-nourished.  HENT:  Head: Normocephalic and atraumatic.  Eyes: EOM are normal.  Neck: Normal range of motion.  Cardiovascular: Normal rate, regular rhythm, normal heart sounds and intact distal pulses.   Pulmonary/Chest: Effort normal and breath sounds normal. No respiratory  distress.  Abdominal: Soft. He exhibits no distension.       Tenderness with guarding a right lower quadrant.  No rebound tenderness  Musculoskeletal: Normal range of motion.  Neurological: He is alert and oriented to person, place, and time.  Skin: Skin is warm and dry.  Psychiatric: He has a normal mood and affect. Judgment normal.    ED Course  Procedures (including critical care time)  Labs Reviewed  URINALYSIS, ROUTINE W REFLEX MICROSCOPIC - Abnormal; Notable for the following:    Color, Urine RED (*) BIOCHEMICALS MAY BE AFFECTED BY COLOR   APPearance CLOUDY (*)    Hgb urine dipstick LARGE (*)    Bilirubin Urine MODERATE (*)    Ketones, ur 15 (*)    Protein, ur 100 (*)    Leukocytes, UA SMALL (*)    All other components within normal limits  COMPREHENSIVE METABOLIC PANEL - Abnormal; Notable for the following:    Glucose, Bld 101 (*)    Total Bilirubin 0.1 (*)    GFR calc non Af Amer 79 (*)    All other components within normal limits  CBC - Abnormal; Notable for the following:    WBC 18.2 (*)    All other components within normal limits  DIFFERENTIAL - Abnormal; Notable for the following:    Neutro Abs 11.5 (*)    Lymphs Abs 5.8 (*)    All other components within normal  limits  URINE MICROSCOPIC-ADD ON - Abnormal; Notable for the following:    Bacteria, UA FEW (*)    Crystals CA OXALATE CRYSTALS (*)    All other components within normal limits  LIPASE, BLOOD   Ct Abdomen Pelvis W Contrast  12/18/2011  *RADIOLOGY REPORT*  Clinical Data: Right lower quadrant abdominal pain started this evening.  CT ABDOMEN AND PELVIS WITH CONTRAST  Technique:  Multidetector CT imaging of the abdomen and pelvis was performed following the standard protocol during bolus administration of intravenous contrast.  Contrast: 1 OMNIPAQUE IOHEXOL 300 MG/ML  SOLN, OMNIPAQUE IOHEXOL 300 MG/ML  SOLN  Comparison: None.  Findings: The lung bases are clear.  There is a 5 mm stone in the mid right  ureter at the level of the L4-5 interspace.  There is proximal ureterectasis and pyelocaliectasis with pararenal and periureteral stranding consistent with moderate obstruction. Associated delay in appearance of contrast material in the right renal collecting system with respect to the left side.  Small intrarenal nonobstructing stones demonstrated on the right.  No stone or obstruction in the left kidney.  The liver, spleen, gallbladder, pancreas, adrenal glands, and retroperitoneal lymph nodes are unremarkable.  Calcification of the aorta without aneurysm.  The gastric wall is not thickened.  Small bowel are not dilated.  Stool filled colon without distension.  No free fluid or free air in the abdomen.  Pelvis:  The bladder wall is not thickened.  Calcification in the prostate gland.  No inflammatory changes in the sigmoid colon.  The appendix is normal.  No free or loculated pelvic fluid collections. Degenerative changes in the spine.  IMPRESSION: 5 mm stone in the mid right ureter with moderate proximal obstruction.  Additional nonobstructing intrarenal stones on the right.  Normal appendix.  Original Report Authenticated By: Marlon Pel, M.D.     1. Ureteral stone       MDM  Tenderness with guarding in the right lower quadrant.  Labs demonstrated a white count of 18,000.  CT pending.  He does have a few Calcium oxalate crystals on his urinalysis and therefore may represent a distal ureteral stone on the right.  He also has large blood in his urine.  5:18 AM His pain is improving but not completely resolved.  An additional dose of pain medicine and Toradol given in the emergency department.  The patient understands to return to the [facility] long ER for development of fever worsening pain or severe nausea vomiting and inability to keep fluids down.  He will call the urologist for followup.  Discharge home       Lyanne Co, MD 12/18/11 [phone]

## 2011-12-18 NOTE — ED Notes (Signed)
PT. REPORTS RIGHT ABDOMINAL PAIN ONSET THIS EVENING , DENIES NAUSEA /VOMITTING OR DIARRHEA  , NO FEVER OR CHILLS, SLIGHT RIGHT LOW BACK PAIN .

## 2015-02-18 ENCOUNTER — Encounter: Payer: Self-pay | Admitting: Gastroenterology

## 2015-09-25 ENCOUNTER — Emergency Department (HOSPITAL_COMMUNITY): Payer: Commercial Managed Care - PPO

## 2015-09-25 ENCOUNTER — Encounter (HOSPITAL_COMMUNITY): Payer: Self-pay | Admitting: Emergency Medicine

## 2015-09-25 ENCOUNTER — Emergency Department (HOSPITAL_COMMUNITY)
Admission: EM | Admit: 2015-09-25 | Discharge: 2015-09-25 | Disposition: A | Discharge status: Home / Self Care | Payer: Commercial Managed Care - PPO | Attending: Emergency Medicine | Admitting: Emergency Medicine

## 2015-09-25 DIAGNOSIS — Z8659 Personal history of other mental and behavioral disorders: Secondary | ICD-10-CM | POA: Insufficient documentation

## 2015-09-25 DIAGNOSIS — J0100 Acute maxillary sinusitis, unspecified: Secondary | ICD-10-CM | POA: Diagnosis not present

## 2015-09-25 DIAGNOSIS — G4700 Insomnia, unspecified: Secondary | ICD-10-CM | POA: Insufficient documentation

## 2015-09-25 DIAGNOSIS — E039 Hypothyroidism, unspecified: Secondary | ICD-10-CM | POA: Insufficient documentation

## 2015-09-25 DIAGNOSIS — I10 Essential (primary) hypertension: Secondary | ICD-10-CM | POA: Diagnosis not present

## 2015-09-25 DIAGNOSIS — F17200 Nicotine dependence, unspecified, uncomplicated: Secondary | ICD-10-CM | POA: Diagnosis not present

## 2015-09-25 DIAGNOSIS — Z79899 Other long term (current) drug therapy: Secondary | ICD-10-CM | POA: Insufficient documentation

## 2015-09-25 DIAGNOSIS — R7989 Other specified abnormal findings of blood chemistry: Secondary | ICD-10-CM | POA: Insufficient documentation

## 2015-09-25 DIAGNOSIS — Z8639 Personal history of other endocrine, nutritional and metabolic disease: Secondary | ICD-10-CM | POA: Diagnosis not present

## 2015-09-25 DIAGNOSIS — R51 Headache: Secondary | ICD-10-CM | POA: Diagnosis present

## 2015-09-25 DIAGNOSIS — R519 Headache, unspecified: Secondary | ICD-10-CM

## 2015-09-25 DIAGNOSIS — E785 Hyperlipidemia, unspecified: Secondary | ICD-10-CM | POA: Insufficient documentation

## 2015-09-25 IMAGING — CT CT HEAD W/O CM
2 series · 16 of 30 positions shown, 20 images · non-contrast
Comparison: None.

CLINICAL DATA: 54-year-old male with 1 month history of constant
headache

EXAM:
CT HEAD WITHOUT CONTRAST
TECHNIQUE: Contiguous axial images were obtained from the base of the skull
through the vertex without intravenous contrast.

[Series 2: head w/o · axial · non-contrast · 0.46mm/px · z∈[+1034,+1169]mm · 13 of 33 slices shown, 17 images]
[im 3/33  brain]
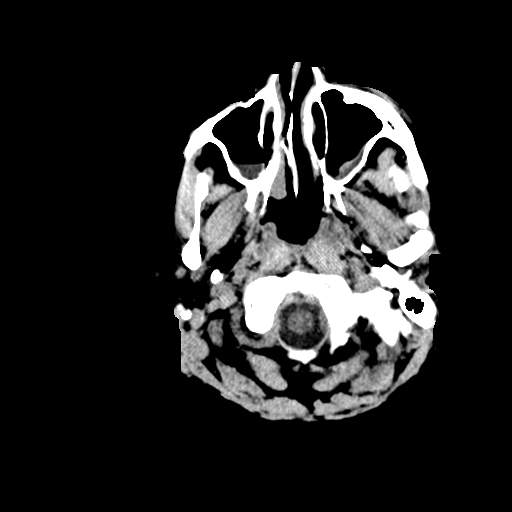
[im 3/33  bone]
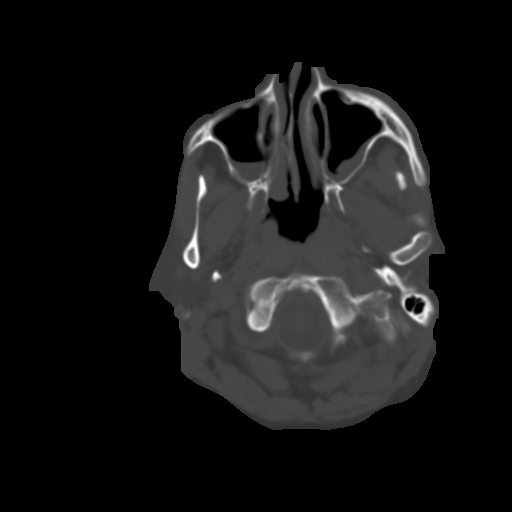
[im 5/33  brain]
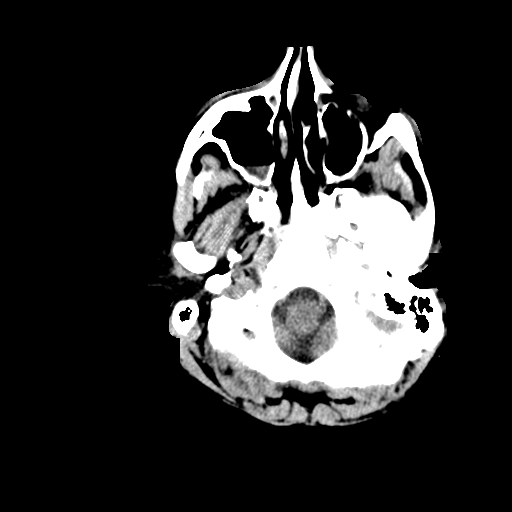
[im 7/33  brain]
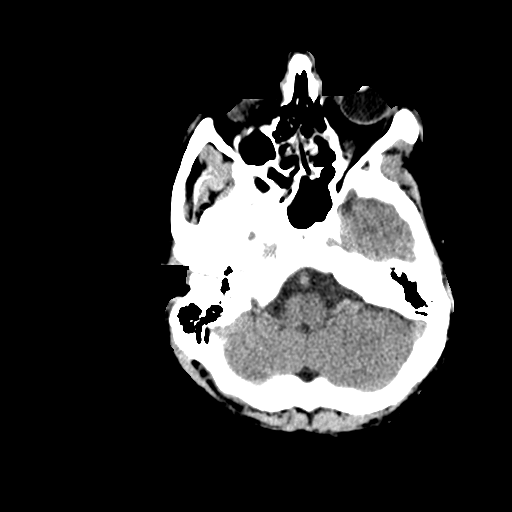
[im 10/33  brain]
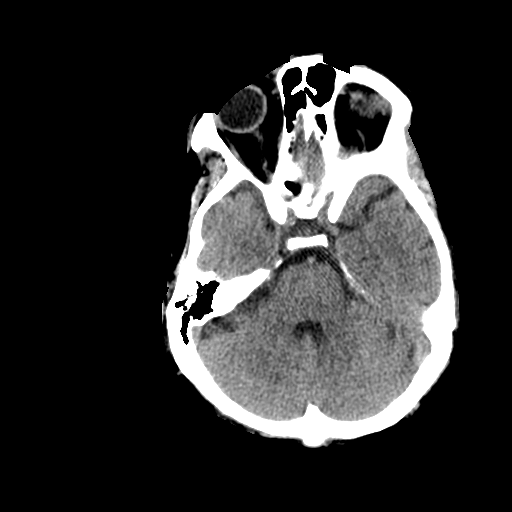
[im 12/33  brain]
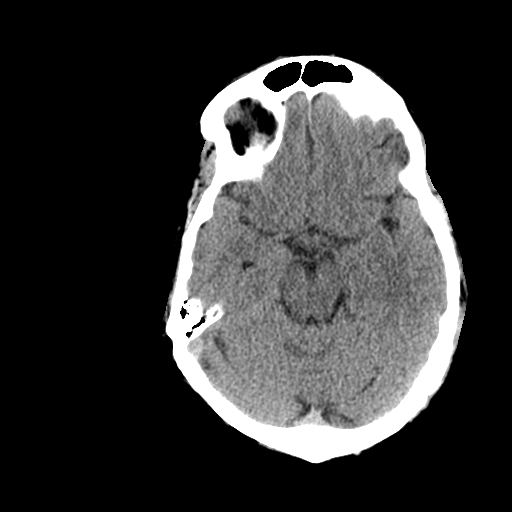
[im 12/33  bone]
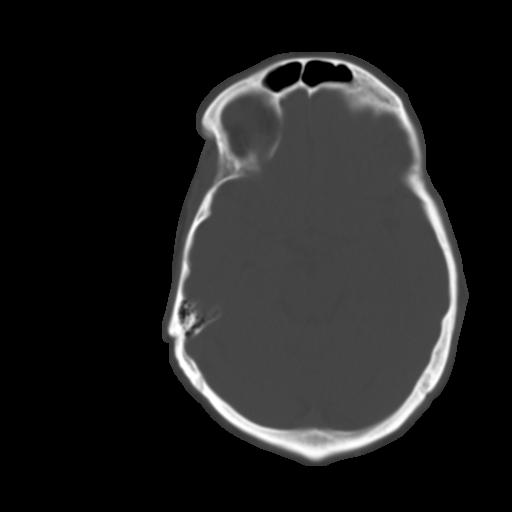
[im 14/33  brain]
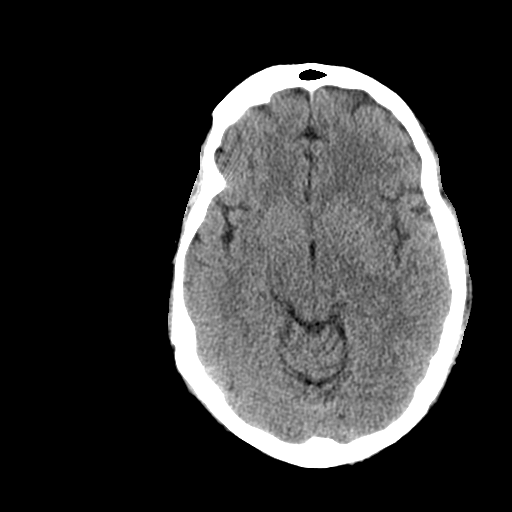
[im 17/33  brain]
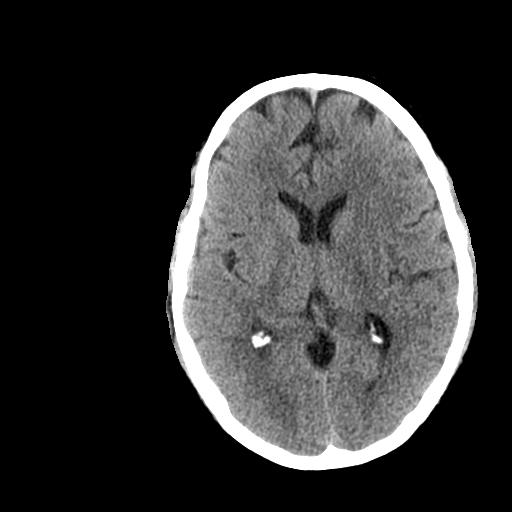
[im 19/33  brain]
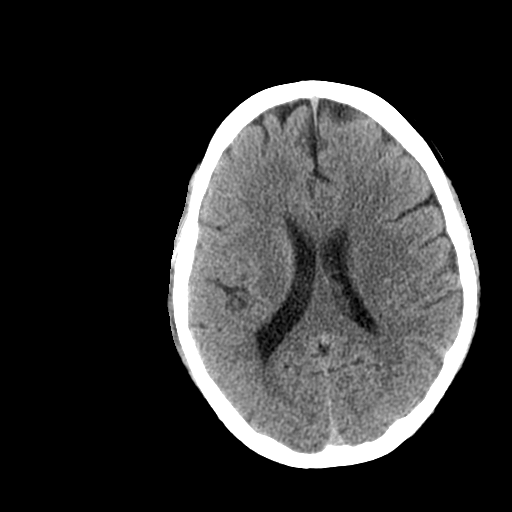
[im 21/33  brain]
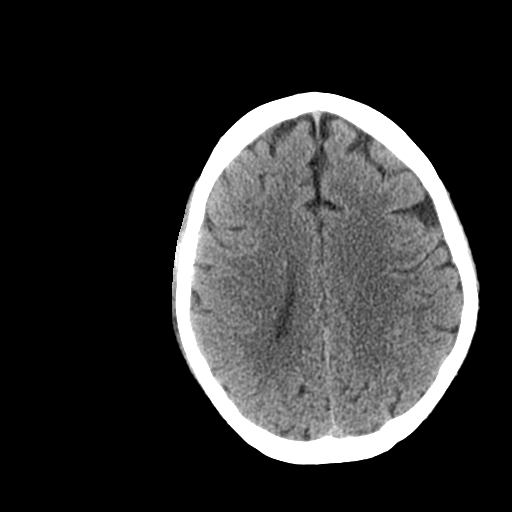
[im 21/33  bone]
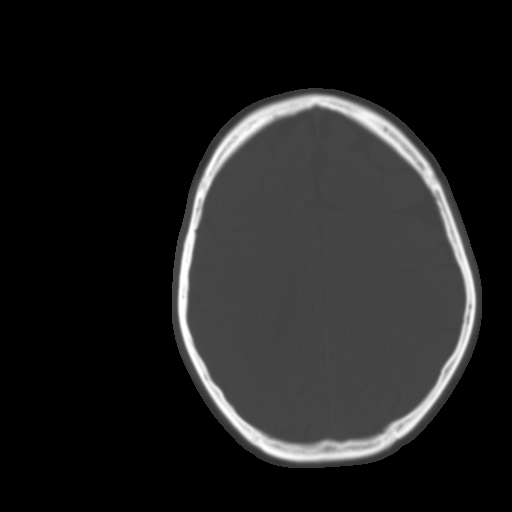
[im 23/33  brain]
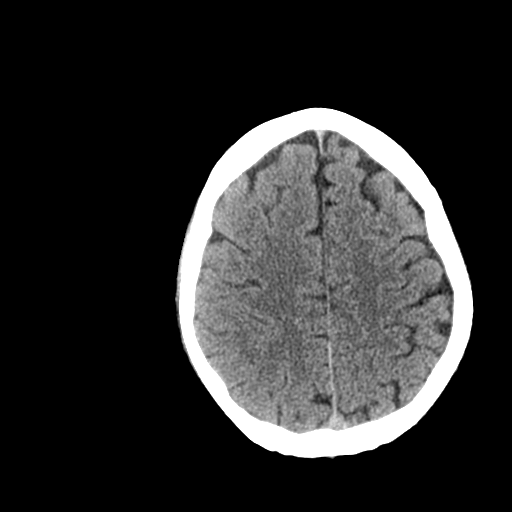
[im 26/33  brain]
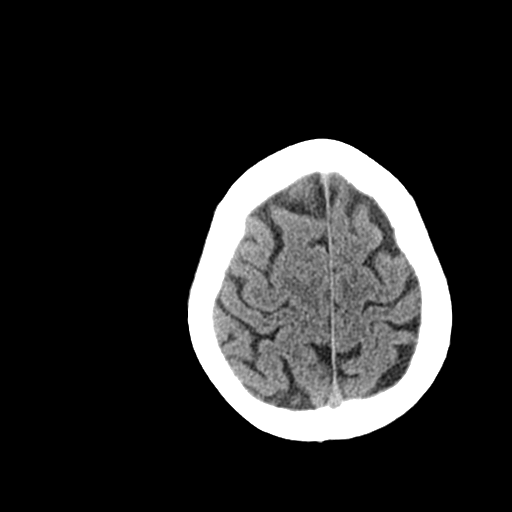
[im 28/33  brain]
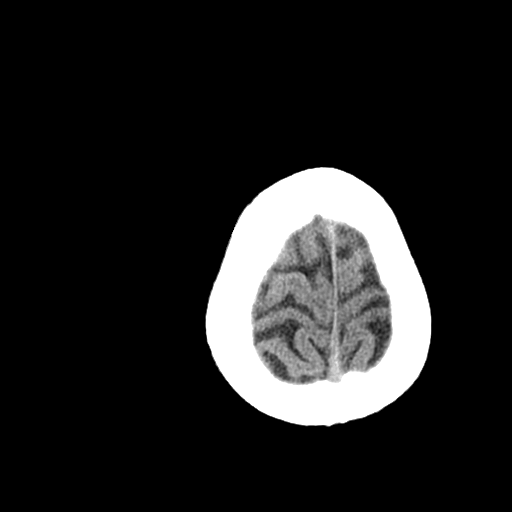
[im 30/33  brain]
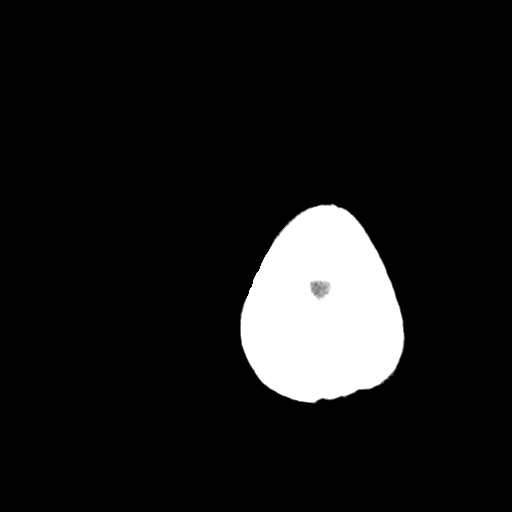
[im 30/33  bone]
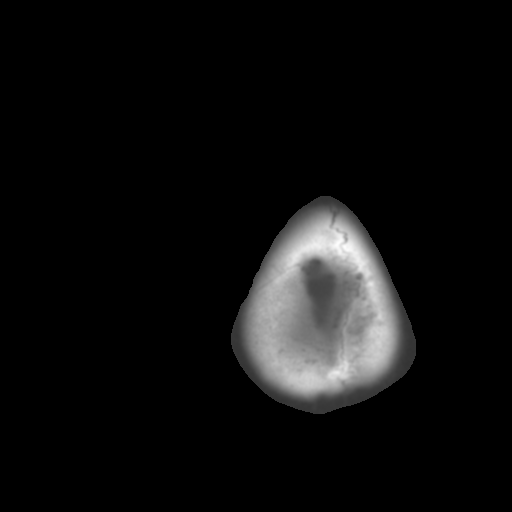

[Series 3: bone windows · axial · 0.46mm/px · z∈[+1034,+1079]mm · 3 of 33 slices shown]
[im 3/33  bone]
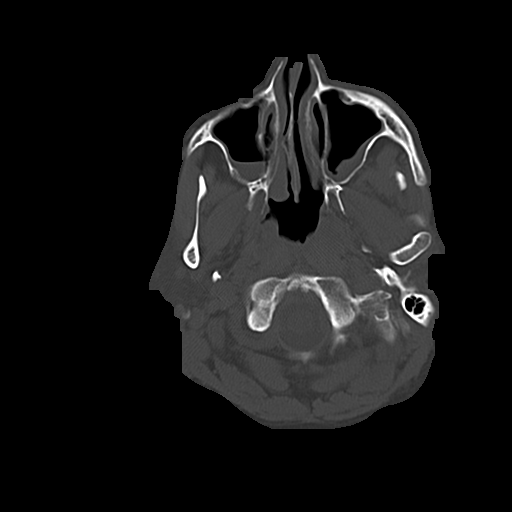
[im 7/33  bone]
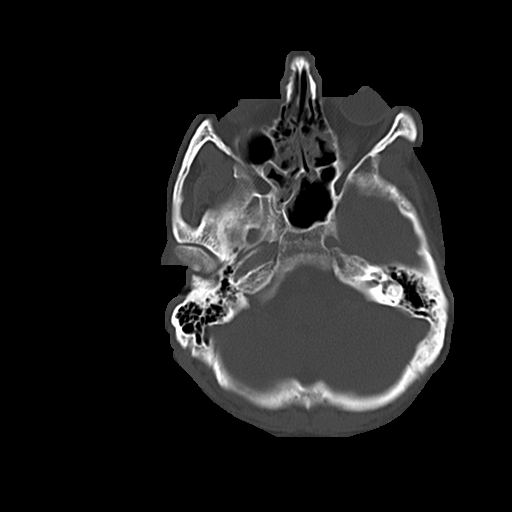
[im 12/33  bone]
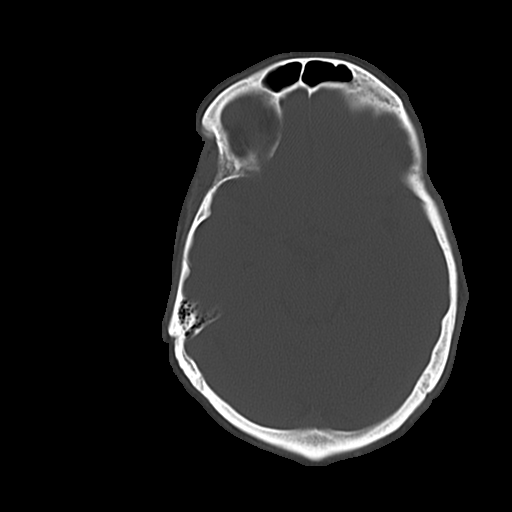

[16 of 30 positions shown; findings below may reference images not displayed]

FINDINGS: Negative for acute intracranial hemorrhage, acute infarction, mass,
mass effect, hydrocephalus or midline shift. Gray-white
differentiation is preserved throughout. No acute soft tissue or
calvarial abnormality. The globes and orbits are symmetric and
unremarkable. Mucoperiosteal thickening and partial opacification of
scattered ethmoid air cells. Air-fluid level present in both
maxillary sinuses.
IMPRESSION: 1. No acute intracranial abnormality.
2. Inflammatory paranasal sinus disease involving both maxillary
sinuses and the ethmoid air cells. The presence of an air-fluid
level in the right maxillary sinus suggests the possibility of acute
sinusitis.

## 2015-09-25 MED ORDER — METOCLOPRAMIDE HCL 5 MG/ML IJ SOLN
10.0000 mg | Freq: Once | INTRAMUSCULAR | Status: AC
  Administered 2015-09-25: 10 mg via INTRAVENOUS
  Filled 2015-09-25: qty 2

## 2015-09-25 MED ORDER — DIPHENHYDRAMINE HCL 50 MG/ML IJ SOLN
25.0000 mg | Freq: Once | INTRAMUSCULAR | Status: AC
  Administered 2015-09-25: 25 mg via INTRAVENOUS
  Filled 2015-09-25: qty 1

## 2015-09-25 MED ORDER — SODIUM CHLORIDE 0.9 % IV BOLUS (SEPSIS)
1000.0000 mL | Freq: Once | INTRAVENOUS | Status: AC
  Administered 2015-09-25: 1000 mL via INTRAVENOUS

## 2015-09-25 MED ORDER — AMOXICILLIN-POT CLAVULANATE 875-125 MG PO TABS: 1.0000 | ORAL_TABLET | Freq: Two times a day (BID) | ORAL | Status: DC

## 2015-09-25 NOTE — ED Notes (Signed)
Pt c/o generalized headache x 1 month. Describes it as a constant pounding in his head that he takes ibuprofen for frequently, his PCP recommended he come here for possible imaging. Denies nausea/vomiting, fever/chills.

## 2015-09-25 NOTE — ED Provider Notes (Signed)
CSN: [id number]     Arrival date & time 09/25/15  1012 History   First MD Initiated Contact with Patient 09/25/15 1041     Chief Complaint  Patient presents with  . Headache    HPI   [name] is an 55 y.o. male with history of depression, hypothyroidism, HTN, HLD who presents to the ED for evaluation of headache. He states he has had his current headache for the past month. He states that he has tried ibuprofen, goody/BC powders with no relief. He states that the headache is constantly there to some degree. States sometimes it keeps him from falling asleep or wakes him in the middle of the night. He states it is mostly occipital, though can be frontal or temporal. States his headache is currently frontal with no radiation. He rates the headache an 8/10. He denies associated visual disturbance, photophobia/phonophobia, n/v, fever, chills. Denies night sweats or unintentional weight loss. Denies weakness, numbness, tingling, or dizziness. He was seen by his PCP at [facility] today for sinusitis and told to come to the ED for evaluation and possible imaging of his headache. Pt states he has had headaches intermittently throughout his life of the same character/severity, though never this long in duration. He does report increased stress at home including his wife having had recent major surgery and both of them recently losing their jobs. Denies fam hx of aneurysm or malignancy. Endorses smoking tobacco daily.   Past Medical History  Diagnosis Date  . Hypothyroidism    Past Surgical History  Procedure Laterality Date  . Carpal tunnel release     History reviewed. No pertinent family history. Social History  Substance Use Topics  . Smoking status: Current Every Day Smoker  . Smokeless tobacco: None  . Alcohol Use: No    Review of Systems  All other systems reviewed and are negative.     Allergies  Review of patient's allergies indicates no known allergies.  Home Medications   Prior to  Admission medications   Medication Sig Start Date End Date Taking? Authorizing Provider  CHONDROITIN SULFATE PO Take 1 tablet by mouth daily.    Historical Provider, MD  levothyroxine (SYNTHROID, LEVOTHROID) 200 MCG tablet Take 200 mcg by mouth daily.    Historical Provider, MD  predniSONE (STERAPRED UNI-PAK) 5 MG TABS Take by mouth daily.    Historical Provider, MD  Tamsulosin HCl (FLOMAX) 0.4 MG CAPS Take 1 capsule (0.4 mg total) by mouth daily. 12/18/11   Raymond Schmidt, MD   BP 137/81 mmHg  Pulse 65  Temp(Src) 97.6 F (36.4 C) (Oral)  Resp 16  SpO2 97% Physical Exam  Constitutional: He is oriented to person, place, and time. No distress.  HENT:  Right Ear: External ear normal.  Left Ear: External ear normal.  Nose: Nose normal.  Mouth/Throat: Oropharynx is clear and moist. No oropharyngeal exudate.  Eyes: Conjunctivae and EOM are normal. Pupils are equal, round, and reactive to light.  No nystagmus  Neck: Normal range of motion. Neck supple.  Cardiovascular: Normal rate, regular rhythm, normal heart sounds and intact distal pulses.   Pulmonary/Chest: Effort normal and breath sounds normal. No respiratory distress. He exhibits no tenderness.  Abdominal: Soft. Bowel sounds are normal. He exhibits no distension. There is no tenderness.  Musculoskeletal: Normal range of motion. He exhibits no edema.  Neurological: He is alert and oriented to person, place, and time. He has normal reflexes. He displays no tremor. No cranial nerve deficit or sensory deficit.  Coordination and gait normal.  Skin: Skin is warm and dry. He is not diaphoretic.  Psychiatric: He has a normal mood and affect.  Nursing note and vitals reviewed.   ED Course  Procedures (including critical care time) Labs Review Labs Reviewed - No data to display  Imaging Review No results found. I have personally reviewed and evaluated these images and lab results as part of my medical decision-making.   EKG  Interpretation None      MDM   Final diagnoses:  Acute nonintractable headache, unspecified headache type  Acute maxillary sinusitis, recurrence not specified    Pt is an 55 y.o. male with one month of headache, sent to the ER by his PCP for further evaluation. He has a completely benign exam with no focal neuro deficits, no meningismus. He is afebrile. He does report increased stress recently which I suspect might be contributing to his persistent headache. Denies thunderclap onset. However, given PCP concern will obtain CT head. Will give headache cocktail and IV fluids.  CT reading not crossing over but is negative for acute intracranial abnormality. There is evidence of sinusitis. Pt's PCP has given him rx for flonase. I will also cover with antibiotics. Headache improved with headache cocktail. Pt is nontoxic and stable for discharge and PCP f/u. ER return precautions given.    Anne Ng, PA-C 09/27/15 1401  Daleen Bo, MD 09/28/15 [phone]

## 2015-09-25 NOTE — Discharge Instructions (Signed)
You were seen in the emergency room today for evaluation of headache. Your headache improved with medication and IV fluids here.  Your CT scan showed no evidence of mass or bleeding. They did see sinusitis. This could be contributing to your headache as well. I will give you a prescription for one week of antibiotics. Please also fill the prescription for flonase that your primary care provider gave you today. Please follow up with your primary care provider next week. If you continue to have headaches, consider calling neurology (contact info provided) to schedule an appointment for evaluation.

## 2015-09-25 NOTE — ED Notes (Signed)
Pt reported having a constant headache x1 month but denies visual disturbances, hitting head, LOC, or dizziness/lightheadedness.

## 2015-11-24 ENCOUNTER — Telehealth: Payer: Self-pay | Admitting: Gastroenterology

## 2015-11-25 NOTE — Telephone Encounter (Signed)
Pt is past due for a colon, he had his last one 03/2010 and was sent a letter stating he needed a 5 year recall.  He was due in 03/2015.  Do you want to just set him up for a direct colon since the appt schedule is so tight?

## 2015-11-25 NOTE — Telephone Encounter (Signed)
Left message on machine to call back regarding appt for direct colon

## 2015-11-25 NOTE — Telephone Encounter (Signed)
OK for direct colonoscopy, for polyp surveillance.  thanks

## 2015-11-26 NOTE — Telephone Encounter (Signed)
Lori @ [facility] [phone]  She will only be there another hour

## 2015-11-30 NOTE — Telephone Encounter (Signed)
Cecille Rubin was notified that the pt can call in for a direct colon.  She will have the pt call and book the procedure.

## 2016-01-25 ENCOUNTER — Encounter: Payer: Self-pay | Admitting: Gastroenterology

## 2016-01-25 ENCOUNTER — Ambulatory Visit (INDEPENDENT_AMBULATORY_CARE_PROVIDER_SITE_OTHER): Payer: BLUE CROSS/BLUE SHIELD | Admitting: Gastroenterology

## 2016-01-25 VITALS — BP 110/70 | HR 72 | Ht 68.5 in | Wt 170.0 lb

## 2016-01-25 DIAGNOSIS — R195 Other fecal abnormalities: Secondary | ICD-10-CM | POA: Diagnosis not present

## 2016-01-25 MED ORDER — NA SULFATE-K SULFATE-MG SULF 17.5-3.13-1.6 GM/177ML PO SOLN: 1.0000 | Freq: Once | ORAL | Status: DC

## 2016-01-25 NOTE — Progress Notes (Signed)
Review of pertinent gastrointestinal problems: 1. Adenomatous polyp:  colonoscopy 03/2010 Dr. Ardis Hughs for routine screening found 2 subCM polyps, one was a TA.  5 year recall was recommended.  A letter was sent in 2016 reminding him about this, we never heard from him however.  HPI: This is a  very pleasant 55 year old man     who was referred to me by Aletha Halim., PA-C  to evaluate  Hemoccult-positive stools .    Chief complaint is Hemoccult-positive stools  Home test for his PCP, this showed microscopic blood in his stool.  He never sees blood in his stools.  No FH of colon cancer.  No changes in his bowels.  Review of systems: Pertinent positive and negative review of systems were noted in the above HPI section. Complete review of systems was performed and was otherwise normal.   Past Medical History  Diagnosis Date  . Hypothyroidism   . Hypertension   . Hemorrhoids   . Anxiety and depression     Past Surgical History  Procedure Laterality Date  . Carpal tunnel release    . Biceps tendon repair      Current Outpatient Prescriptions  Medication Sig Dispense Refill  . ALPRAZolam (XANAX) 0.5 MG tablet Take 0.5 mg by mouth 2 (two) times daily.    Marland Kitchen levothyroxine (SYNTHROID, LEVOTHROID) 200 MCG tablet Take 200 mcg by mouth daily.    Marland Kitchen olmesartan (BENICAR) 40 MG tablet Take 40 mg by mouth daily.    . sertraline (ZOLOFT) 100 MG tablet Take 100 mg by mouth daily.     No current facility-administered medications for this visit.    Allergies as of 01/25/2016  . (No Known Allergies)    Family History  Problem Relation Age of Onset  . Kidney disease Mother   . Hypertension Father   . Breast cancer Paternal Grandmother   . Kidney failure Maternal Grandmother   . Multiple sclerosis Sister   . Heart attack Brother     x 3  . Heart defect Maternal Uncle     Social History   Social History  . Marital Status: Married    Spouse Name: N/A  . Number of Children: 1   . Years of Education: N/A   Occupational History  . construction     remodels houses   Social History Main Topics  . Smoking status: Current Every Day Smoker -- 1.50 packs/day    Types: Cigarettes  . Smokeless tobacco: Not on file  . Alcohol Use: 8.4 oz/week    14 Standard drinks or equivalent per week     Comment: 1-2 drinks a day, bourbon  . Drug Use: No  . Sexual Activity: Not on file   Other Topics Concern  . Not on file   Social History Narrative     Physical Exam: BP 110/70 mmHg  Pulse 72  Ht 5' 8.5" (1.74 m)  Wt 170 lb (77.111 kg)  BMI 25.47 kg/m2 Constitutional: generally well-appearing Psychiatric: alert and oriented x3 Eyes: extraocular movements intact Mouth: oral pharynx moist, no lesions Neck: supple no lymphadenopathy Cardiovascular: heart regular rate and rhythm Lungs: clear to auscultation bilaterally Abdomen: soft, nontender, nondistended, no obvious ascites, no peritoneal signs, normal bowel sounds Extremities: no lower extremity edema bilaterally Skin: no lesions on visible extremities   Assessment and plan: 55 y.o. male with  Hemoccult-positive stools, personal history of precancerous polyp 2011 colonoscopy  We sent him a colonoscopy reminder letter about a year ago but did  not hear from him after that. He has Hemoccult-positive stool and a personal history of precancerous colon polyps. I recommended colonoscopy at his soonest convenience. He has no change in bowels and no overt bleeding and so I think is unlikely this is anything serious. I see no reason for any further blood tests or imaging studies at this point   Owens Loffler, MD [facility] 01/25/2016, 10:06 AM  Cc: Aletha Halim., PA-C

## 2016-01-25 NOTE — Patient Instructions (Signed)
One of your biggest health concerns is your smoking.  This increases your risk for most cancers and serious cardiovascular diseases such as strokes, heart attacks.  You should try your best to stop.  If you need assistance, please contact your PCP or Smoking Cessation Class at [facility] [phone]) or [facility] (1-800-QUIT-NOW).  You will be set up for a colonoscopy for hemocult + stools.

## 2016-02-05 DIAGNOSIS — R569 Unspecified convulsions: Secondary | ICD-10-CM

## 2016-02-05 HISTORY — DX: Unspecified convulsions: R56.9

## 2016-02-22 ENCOUNTER — Other Ambulatory Visit: Payer: Self-pay | Admitting: Family Medicine

## 2016-02-22 DIAGNOSIS — R569 Unspecified convulsions: Secondary | ICD-10-CM

## 2016-02-24 ENCOUNTER — Ambulatory Visit
Admission: RE | Admit: 2016-02-24 | Discharge: 2016-02-24 | Disposition: A | Discharge status: Home / Self Care | Payer: BLUE CROSS/BLUE SHIELD | Source: Ambulatory Visit | Attending: Family Medicine | Admitting: Family Medicine

## 2016-02-24 DIAGNOSIS — R569 Unspecified convulsions: Secondary | ICD-10-CM
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
## 2016-02-28 ENCOUNTER — Telehealth: Payer: Self-pay | Admitting: Gastroenterology

## 2016-02-28 NOTE — Telephone Encounter (Signed)
Put patient on the [facility] waiting list.  Will call him closer to his procedure and after I get a new shipment

## 2016-02-28 NOTE — Telephone Encounter (Signed)
Magda Paganini do we have any Suprep samples for the pt?  Thanks.

## 2016-03-06 ENCOUNTER — Encounter: Payer: Self-pay | Admitting: Gastroenterology

## 2016-03-07 ENCOUNTER — Telehealth: Payer: Self-pay | Admitting: Gastroenterology

## 2016-03-08 NOTE — Telephone Encounter (Signed)
Left message that I had a Suprep sample for patient and would leave it up front to be picked up.

## 2016-03-09 ENCOUNTER — Encounter: Payer: Self-pay | Admitting: Neurology

## 2016-03-09 ENCOUNTER — Ambulatory Visit (INDEPENDENT_AMBULATORY_CARE_PROVIDER_SITE_OTHER): Payer: BLUE CROSS/BLUE SHIELD | Admitting: Neurology

## 2016-03-09 ENCOUNTER — Telehealth: Payer: Self-pay | Admitting: Gastroenterology

## 2016-03-09 DIAGNOSIS — R51 Headache: Secondary | ICD-10-CM | POA: Diagnosis not present

## 2016-03-09 DIAGNOSIS — R413 Other amnesia: Secondary | ICD-10-CM

## 2016-03-09 DIAGNOSIS — R071 Chest pain on breathing: Secondary | ICD-10-CM | POA: Diagnosis not present

## 2016-03-09 DIAGNOSIS — R0789 Other chest pain: Secondary | ICD-10-CM | POA: Insufficient documentation

## 2016-03-09 DIAGNOSIS — R519 Headache, unspecified: Secondary | ICD-10-CM

## 2016-03-09 DIAGNOSIS — R569 Unspecified convulsions: Secondary | ICD-10-CM | POA: Diagnosis not present

## 2016-03-09 MED ORDER — METHYLPREDNISOLONE 4 MG PO TABS: ORAL_TABLET | ORAL | 0 refills | Status: DC

## 2016-03-09 MED ORDER — MELOXICAM 15 MG PO TABS: 15.0000 mg | ORAL_TABLET | Freq: Every day | ORAL | 3 refills | Status: DC

## 2016-03-09 NOTE — Telephone Encounter (Signed)
Pt was notified that a sample prep was left at the front desk for pick up.  He states he will come over and pick up today.

## 2016-03-09 NOTE — Progress Notes (Signed)
[facility]  PATIENT: [name] DOB: [birth year]  REFERRING DOCTOR OR PCP:  Bing Matter, PA-C ([place]) SOURCE: Patient, sister, notes from his Deatra Ina, notes from the emergency room, laboratory and CTreports, CT images on PACS.  _________________________________   HISTORICAL  CHIEF COMPLAINT:  Chief Complaint  Patient presents with  . Dizziness    rm   12, sisterJuliann Reed, [place] Pt, referred for seizures, vertigo, "dizziness, loss of memroy"  . Memory Loss    HISTORY OF PRESENT ILLNESS:  I had the pleasure of seeing your patient, [name], at [facility] for neurologic consultation regarding his convulsions,  headaches and memory disturbance.  He had a seizure around February 21, 2016.   He notes that he was very hot before the seizure and felt sick and threw up.  He felt lightheaded and fell fireplace, hitting the brick above (scratching head), he fell to the floor and continued to vomit and then had generalized shaking on/off x 4 - 5 hours.   He vomitted several times during that time and was responsive and communicative in between convulsions.    His wife is in the hospital so details are coming second hand from his sister and the [name].     He fell asleep and was taken to his PCP the next day.    A CT and labs were ordered.     The CT had no acute findings.   Laboratory tests showed elevated TSH (47.2), mildly low Potassium (3.4), elevated ASt (89) but normal ALT, slightly elevated WBC 11.0.   Magnesium was normal.    His mother had told him that when he got hot he would have occasional spells of shaking associated with being overheated   She would treat him with cold compresses with benefit.   She never described seizures or loss of consciousness.     He also reports daily headaches at the top of the head. These are not associated with nausea, vomiting or phonophobia. Moving does not significantly change the  quality or intensity of the pain. Generally the headaches worsen as the day goes on, more so if he is hot. Nothing really helps the headache. He has not tried many OTC meds.  He notes some neck pain.    He also reports some lower back pain. He has chest pain that is most noted when he coughs or sneezes.  Ever since the day he had multiple convulsions, he has not been able to remember events from the month of June.   However, he is feeling that his memory now is at baseline.  He generally sleeps poorly. He does not recall if he had a good or bad night before the episodes in mid July.    His sister has MS.    His brother has had MI x 2 and a stroke.   Other family members have had heart disease.  Other Data Reviewed:   I personally reviewed notes from Bing Matter, PA-C 02/22/2016 summarizing events surrounding his seizure and from his emergency room visit dated 09/25/2015 for evaluation of headache..  The records from the emergency room visit including Imaging reports were reviewed.  CT scan images (on PACS) from 02/24/2016 and from 09/25/2015 were personally reviewed.   The brain is normal for age and do not show any acute changes findings.   The 09/25/2015 CT scan also showed chronic maxillary and ethmoid sinusitis. Labs from July were reviewed    REVIEW OF SYSTEMS: Constitutional:  No fevers, chills, sweats, or change in appetite.  He notes fatigue.  Insomnia some nights. Eyes: No  double vision, eye pain.  Occasional blurred vision. Ear, nose and throat: No hearing loss, ear pain, nasal congestion, sore throat Cardiovascular: No chest pain, palpitations Respiratory: No shortness of breath at rest or with exertion.   No wheezes GastrointestinaI: No nausea, vomiting, diarrhea, abdominal pain, fecal incontinence Genitourinary: No dysuria, urinary retention or frequency.  No nocturia. Musculoskeletal: Mild neck pain, back pain.   He has had some cramps in his muscles at times and some joint  pain. Integumentary: No rash, pruritus, skin lesions Neurological: as above Psychiatric: Denies depression at this time.  Notes mild anxiety Endocrine: No palpitations, diaphoresis, change in appetite, change in weigh or increased thirst Hematologic/Lymphatic: No anemia, purpura, petechiae. Allergic/Immunologic: No itchy/runny eyes, nasal congestion, recent allergic reactions, rashes  ALLERGIES: No Known Allergies  HOME MEDICATIONS:  Current Outpatient Prescriptions:  .  ALPRAZolam (XANAX) 0.5 MG tablet, Take 0.5 mg by mouth 2 (two) times daily., Disp: , Rfl:  .  levothyroxine (SYNTHROID, LEVOTHROID) 175 MCG tablet, 175 mcg daily., Disp: , Rfl:  .  olmesartan (BENICAR) 40 MG tablet, Take 40 mg by mouth daily., Disp: , Rfl:  .  sertraline (ZOLOFT) 100 MG tablet, Take 100 mg by mouth daily., Disp: , Rfl:  .  meloxicam (MOBIC) 15 MG tablet, Take 1 tablet (15 mg total) by mouth daily., Disp: 30 tablet, Rfl: 3 .  methylPREDNISolone (MEDROL) 4 MG tablet, Take 6 po the first day then decrease by one pill daily until complete., Disp: 21 tablet, Rfl: 0  PAST MEDICAL HISTORY: Past Medical History:  Diagnosis Date  . Anxiety and depression   . Hemorrhoids   . Hypertension   . Hypothyroidism   . Seizures ([place]) 02/2016    PAST SURGICAL HISTORY: Past Surgical History:  Procedure Laterality Date  . BICEPS TENDON REPAIR Right   . CARPAL TUNNEL RELEASE Bilateral   . KNEE CARTILAGE SURGERY Left   . KNEE SURGERY Right    x 2    FAMILY HISTORY: Family History  Problem Relation Age of Onset  . Hypertension Father   . Kidney disease Mother   . Breast cancer Paternal Grandmother   . Kidney failure Maternal Grandmother   . Multiple sclerosis Sister   . Heart attack Brother     x 3  . Heart defect Maternal Uncle     SOCIAL HISTORY:  Social History   Social History  . Marital status: Married    Spouse name: [name]  . Number of children: 1  . Years of education: 68   Occupational  History  . construction     remodels houses   Social History Main Topics  . Smoking status: Current Every Day Smoker    Packs/day: 1.50    Types: Cigarettes  . Smokeless tobacco: Never Used  . Alcohol use 8.4 oz/week    14 Standard drinks or equivalent per week     Comment: 03/09/16 3 drinks a week  . Drug use: No  . Sexual activity: Not on file   Other Topics Concern  . Not on file   Social History Narrative   Lives with wife   Caffeine- coffee 2 cups daily     PHYSICAL EXAM  Vitals:   03/09/16 0949  BP: 118/75  Reed: 66  Weight: 162 lb (73.5 kg)  Height: 5' 8.5" (1.74 m)    Body mass index is 24.27 kg/m.  General: The patient is well-developed and well-nourished and in no acute distress  Eyes:  Funduscopic exam shows normal optic discs and retinal vessels.  Neck: The neck is supple, no carotid bruits are noted.  The neck is nontender.  Cardiovascular: The heart has a regular rate and rhythm with a normal S1 and S2. There were no murmurs, gallops or rubs. Lungs are clear to auscultation.  Skin: Extremities are without significant edema.  Musculoskeletal:  Back is slightly tender at the paraspinal muscles. The mid and lower neck is moderately tender. The splenius capitis last occiput region is really tender bilaterally. He is tender over some costochondral joints  Neurologic Exam  Mental status: The patient is alert and oriented x 3 at the time of the examination. The patient has apparent normal recent and remote memory, with an apparently normal attention span and concentration ability.   Speech is normal.  Cranial nerves: Extraocular movements are full. Pupils are equal, round, and reactive to light and accomodation.  Visual fields are full.  Facial symmetry is present. H reports decreased  facial sensation to temperature on the right.Facial strength is normal.  Trapezius and sternocleidomastoid strength is normal. No dysarthria is noted.  The tongue is  midline, and the patient has symmetric elevation of the soft palate. No obvious hearing deficits are noted.  Motor:  Muscle bulk is normal.   Tone is normal. Strength is  5 / 5 in all 4 extremities.   Sensory: Sensory testing:   He reports decreased temperature, soft touch and vibration sensationon the right compared to the left  Coordination: Cerebellar testing reveals good finger-nose-finger and heel-to-shin bilaterally.  Gait and station: Station is normal.   Gait is normal. Tandem gait is mildly wide. Romberg is negative.   Reflexes: Deep tendon reflexes are symmetric and normal bilaterally.   Plantar responses are flexor.    DIAGNOSTIC DATA (LABS, IMAGING, TESTING) - I reviewed patient records, labs, notes, testing and imaging myself where available.  Lab Results  Component Value Date   WBC 18.2 (H) 12/18/2011   HGB 14.3 12/18/2011   HCT 41.6 12/18/2011   MCV 95.9 12/18/2011   PLT 242 12/18/2011      Component Value Date/Time   NA 142 12/18/2011 0019   K 4.0 12/18/2011 0019   CL 103 12/18/2011 0019   CO2 31 12/18/2011 0019   GLUCOSE 101 (H) 12/18/2011 0019   BUN 17 12/18/2011 0019   CREATININE 1.07 12/18/2011 0019   CALCIUM 9.8 12/18/2011 0019   PROT 6.9 12/18/2011 0019   ALBUMIN 3.9 12/18/2011 0019   AST 16 12/18/2011 0019   ALT 12 12/18/2011 0019   ALKPHOS 79 12/18/2011 0019   BILITOT 0.1 (L) 12/18/2011 0019   GFRNONAA 79 (L) 12/18/2011 0019   GFRAA >90 12/18/2011 0019       ASSESSMENT AND PLAN  Convulsions, unspecified convulsion type ([place]) - Plan: MR Brain W Wo Contrast, EEG adult  Occipital headache - Plan: MR Brain W Wo Contrast  Memory loss - Plan: MR Brain W Wo Contrast  Costochondral chest pain   In summary, Mr. Volcy is a 55 year old man with multiple convulsions dated with nausea and vomiting followed by memory loss who also has had headaches on a daily basis for most of this year. By secondhand report, he convulsions could be generalized  tonic-clonic activity associated with seizure I will hold off on lacing him on an anticonvulsant at this time as they were associated with elated symptoms and vomiting  and thus, he might not be at a great risk of additional events.  We will check an EEG and an MRI will at that there is not epileptiform activity and also to make sure that there are not any mass lesions that could be playing a role. This will also help [place] to evaluate his memory disturbance. He has headache, back pain and costochondral pain and I will place him on a steroid pack followed by daily meloxicam to see if he gets a benefit.   If this does not help, I will consider occipital nerve blocks for the headaches.      He will return to see me in 6-8 weeks or sooner if there are new or worsening symptoms. We will of course call him with the results of the studies and see him sooner based on those results.   Thank you for asking me to see Mr. Selvage. Please let me know if I can be of further assistance with her or other patients in the future.  Cristyn Crossno A. Felecia Shelling, MD, PhD [id number], [id number] AM Certified in Neurology, Clinical Neurophysiology, Sleep Medicine, Pain Medicine and Neuroimaging  [facility] [address] [place],  [address] [phone]

## 2016-03-15 ENCOUNTER — Other Ambulatory Visit: Payer: BLUE CROSS/BLUE SHIELD

## 2016-03-16 ENCOUNTER — Encounter: Payer: Self-pay | Admitting: Neurology

## 2016-03-17 ENCOUNTER — Encounter: Payer: Self-pay | Admitting: Gastroenterology

## 2016-03-17 ENCOUNTER — Ambulatory Visit (AMBULATORY_SURGERY_CENTER): Payer: BLUE CROSS/BLUE SHIELD | Admitting: Gastroenterology

## 2016-03-17 VITALS — BP 153/84 | HR 50 | Temp 98.0°F | Resp 12 | Ht 68.5 in | Wt 170.0 lb

## 2016-03-17 DIAGNOSIS — D123 Benign neoplasm of transverse colon: Secondary | ICD-10-CM

## 2016-03-17 DIAGNOSIS — R195 Other fecal abnormalities: Secondary | ICD-10-CM

## 2016-03-17 MED ORDER — SODIUM CHLORIDE 0.9 % IV SOLN: 500.0000 mL | INTRAVENOUS | Status: DC

## 2016-03-17 NOTE — Progress Notes (Signed)
Called to room to assist during endoscopic procedure.  Patient ID and intended procedure confirmed with present staff. Received instructions for my participation in the procedure from the performing physician.  

## 2016-03-17 NOTE — Patient Instructions (Addendum)
YOU HAD AN ENDOSCOPIC PROCEDURE TODAY AT THE [facility]:   Refer to the procedure report that was given to you for any specific questions about what was found during the examination.  If the procedure report does not answer your questions, please call your gastroenterologist to clarify.  If you requested that your care partner not be given the details of your procedure findings, then the procedure report has been included in a sealed envelope for you to review at your convenience later.  YOU SHOULD EXPECT: Some feelings of bloating in the abdomen. Passage of more gas than usual.  Walking can help get rid of the air that was put into your GI tract during the procedure and reduce the bloating. If you had a lower endoscopy (such as a colonoscopy or flexible sigmoidoscopy) you may notice spotting of blood in your stool or on the toilet paper. If you underwent a bowel prep for your procedure, you may not have a normal bowel movement for a few days.  Please Note:  You might notice some irritation and congestion in your nose or some drainage.  This is from the oxygen used during your procedure.  There is no need for concern and it should clear up in a day or so.  SYMPTOMS TO REPORT IMMEDIATELY:   Following lower endoscopy (colonoscopy or flexible sigmoidoscopy):  Excessive amounts of blood in the stool  Significant tenderness or worsening of abdominal pains  Swelling of the abdomen that is new, acute  Fever of 100F or higher    For urgent or emergent issues, a gastroenterologist can be reached at any hour by calling [phone].   DIET: Your first meal following the procedure should be a small meal and then it is ok to progress to your normal diet. Heavy or fried foods are harder to digest and may make you feel nauseous or bloated.  Likewise, meals heavy in dairy and vegetables can increase bloating.  Drink plenty of fluids but you should avoid alcoholic beverages for 24  hours.  ACTIVITY:  You should plan to take it easy for the rest of today and you should NOT DRIVE or use heavy machinery until tomorrow (because of the sedation medicines used during the test).    FOLLOW UP: Our staff will call the number listed on your records the next business day following your procedure to check on you and address any questions or concerns that you may have regarding the information given to you following your procedure. If we do not reach you, we will leave a message.  However, if you are feeling well and you are not experiencing any problems, there is no need to return our call.  We will assume that you have returned to your regular daily activities without incident.  If any biopsies were taken you will be contacted by phone or by letter within the next 1-3 weeks.  Please call us at [phone] if you have not heard about the biopsies in 3 weeks.    SIGNATURES/CONFIDENTIALITY: You and/or your care partner have signed paperwork which will be entered into your electronic medical record.  These signatures attest to the fact that that the information above on your After Visit Summary has been reviewed and is understood.  Full responsibility of the confidentiality of this discharge information lies with you and/or your care-partner.   Information on polyps given to you today 

## 2016-03-17 NOTE — Op Note (Signed)
[facility] Patient Name: [name] Procedure Date: 03/17/2016 1:37 PM MRN: [id number] Endoscopist: Milus Banister , MD Age: 55 Referring MD:  Date of Birth: 08/13/1960 Gender: Male Account #: [id number] Procedure:                Colonoscopy Indications:              High risk colon cancer surveillance: Personal                            history of colonic polyps (subCM TA removed 2011) Medicines:                Monitored Anesthesia Care Procedure:                Pre-Anesthesia Assessment:                           - Prior to the procedure, a History and Physical                            was performed, and patient medications and                            allergies were reviewed. The patient's tolerance of                            previous anesthesia was also reviewed. The risks                            and benefits of the procedure and the sedation                            options and risks were discussed with the patient.                            All questions were answered, and informed consent                            was obtained. Prior Anticoagulants: The patient has                            taken no previous anticoagulant or antiplatelet                            agents. ASA Grade Assessment: II - A patient with                            mild systemic disease. After reviewing the risks                            and benefits, the patient was deemed in                            satisfactory condition to undergo the procedure.  After obtaining informed consent, the colonoscope                            was passed under direct vision. Throughout the                            procedure, the patient's blood pressure, pulse, and                            oxygen saturations were monitored continuously. The                            Model CF-HQ190L [id number]) scope was introduced                            through the  anus and advanced to the the cecum,                            identified by appendiceal orifice and ileocecal                            valve. The colonoscopy was performed without                            difficulty. The patient tolerated the procedure                            well. The quality of the bowel preparation was                            excellent. The ileocecal valve, appendiceal                            orifice, and rectum were photographed. Scope In: 1:40:31 PM Scope Out: 1:51:40 PM Scope Withdrawal Time: 0 hours 9 minutes 7 seconds  Total Procedure Duration: 0 hours 11 minutes 9 seconds  Findings:                 A 7 mm polyp was found in the transverse colon. The                            polyp was sessile. The polyp was removed with a                            cold snare. Resection and retrieval were complete.                           The exam was otherwise without abnormality on                            direct and retroflexion views. Complications:            No immediate complications. Estimated blood loss:  None. Estimated Blood Loss:     Estimated blood loss: none. Impression:               - One 7 mm polyp in the transverse colon, removed                            with a cold snare. Resected and retrieved.                           - The examination was otherwise normal on direct                            and retroflexion views. Recommendation:           - Patient has a contact number available for                            emergencies. The signs and symptoms of potential                            delayed complications were discussed with the                            patient. Return to normal activities tomorrow.                            Written discharge instructions were provided to the                            patient.                           - Resume previous diet.                           - Continue present  medications.                           You will receive a letter within 2-3 weeks with the                            pathology results and my final recommendations.                           If the polyp(s) is proven to be 'pre-cancerous' on                            pathology, you will need repeat colonoscopy in 5                            years. If the polyp(s) is NOT 'precancerous' on                            pathology then you should repeat colon cancer  screening in 10 years with colonoscopy without need                            for colon cancer screening by any method prior to                            then (including stool testing). Milus Banister, MD 03/17/2016 2:05:09 PM This report has been signed electronically.

## 2016-03-17 NOTE — Progress Notes (Signed)
To recovery, repotr to [facility], RN, VSS

## 2016-03-20 ENCOUNTER — Telehealth: Payer: Self-pay

## 2016-03-20 NOTE — Telephone Encounter (Signed)
  Follow up Call-  Call back number 03/17/2016  Post procedure Call Back phone  # [phone]  Permission to leave phone message Yes  Some recent data might be hidden     Patient questions:  Do you have a fever, pain , or abdominal swelling? No. Pain Score  0 *  Have you tolerated food without any problems? Yes.    Have you been able to return to your normal activities? Yes.    Do you have any questions about your discharge instructions: Diet   No. Medications  No. Follow up visit  No.  Do you have questions or concerns about your Care? No.  Actions: * If pain score is 4 or above: No action needed, pain <4.

## 2016-03-21 ENCOUNTER — Telehealth: Payer: Self-pay | Admitting: Neurology

## 2016-03-21 DIAGNOSIS — R413 Other amnesia: Secondary | ICD-10-CM

## 2016-03-21 DIAGNOSIS — R51 Headache: Secondary | ICD-10-CM

## 2016-03-21 DIAGNOSIS — S0590XA Unspecified injury of unspecified eye and orbit, initial encounter: Secondary | ICD-10-CM

## 2016-03-21 DIAGNOSIS — R519 Headache, unspecified: Secondary | ICD-10-CM

## 2016-03-21 NOTE — Telephone Encounter (Signed)
Patient has had injury due to metal in his eyes. He will need an xray before he can have MRI.

## 2016-03-24 ENCOUNTER — Encounter: Payer: Self-pay | Admitting: Gastroenterology

## 2016-04-13 ENCOUNTER — Ambulatory Visit (INDEPENDENT_AMBULATORY_CARE_PROVIDER_SITE_OTHER): Payer: BLUE CROSS/BLUE SHIELD | Admitting: Neurology

## 2016-04-13 DIAGNOSIS — R569 Unspecified convulsions: Secondary | ICD-10-CM

## 2016-04-19 NOTE — Progress Notes (Signed)
   [facility]  EEG (ELECTROENCEPHALOGRAM) REPORT   STUDY DATE: 04/13/2016 PATIENT NAME: [name] DOB: Feb 14, 1961 MRN: [id number]  ORDERING CLINICIAN: Elzie Knisley A. Lamarr Feenstra, MD. PhD  TECHNOLOGIST:Beau [name] TECHNIQUE: Electroencephalogram was recorded utilizing standard 10-20 system of lead placement and reformatted into average and bipolar montages.  RECORDING TIME: 20:21 ACTIVATION: Hyperventilation and photic stimulation  CLINICAL INFORMATION: 55 year old man with convulsions  FINDINGS: Background rhythms of 10 hertz that reacted to eye openings and closing.   There were intermixed activity more anteriorly.  No slowing or asymmetry was noted.. No focal, lateralizing, epileptiform activity or seizures are seen. Patient recorded while awake and drowsy state.   IMPRESSION: This is a normal EEG while the patient was awake and drowsy.   INTERPRETING PHYSICIAN:   Mariano Doshi A. Felecia Shelling, MD, PhD Certified in Neurology, [place] Neurophysiology, Sleep Medicine, Pain Medicine and Neuroimaging  [facility] [address] [place], [place] [address] [phone]

## 2016-04-20 ENCOUNTER — Other Ambulatory Visit: Payer: Self-pay | Admitting: *Deleted

## 2016-04-20 ENCOUNTER — Ambulatory Visit (INDEPENDENT_AMBULATORY_CARE_PROVIDER_SITE_OTHER): Payer: BLUE CROSS/BLUE SHIELD | Admitting: Neurology

## 2016-04-20 ENCOUNTER — Encounter: Payer: Self-pay | Admitting: Neurology

## 2016-04-20 VITALS — BP 150/84 | HR 72 | Resp 16 | Ht 68.5 in | Wt 159.0 lb

## 2016-04-20 DIAGNOSIS — R51 Headache: Secondary | ICD-10-CM

## 2016-04-20 DIAGNOSIS — Z1389 Encounter for screening for other disorder: Secondary | ICD-10-CM

## 2016-04-20 DIAGNOSIS — G4733 Obstructive sleep apnea (adult) (pediatric): Secondary | ICD-10-CM | POA: Diagnosis not present

## 2016-04-20 DIAGNOSIS — G4710 Hypersomnia, unspecified: Secondary | ICD-10-CM

## 2016-04-20 DIAGNOSIS — Z0189 Encounter for other specified special examinations: Secondary | ICD-10-CM

## 2016-04-20 DIAGNOSIS — R413 Other amnesia: Secondary | ICD-10-CM | POA: Diagnosis not present

## 2016-04-20 DIAGNOSIS — R569 Unspecified convulsions: Secondary | ICD-10-CM

## 2016-04-20 DIAGNOSIS — R519 Headache, unspecified: Secondary | ICD-10-CM

## 2016-04-20 NOTE — Progress Notes (Addendum)
[facility]  PATIENT: [name] DOB: 20-Jul-1961  REFERRING DOCTOR OR PCP:  Bing Matter, PA-C ([place]) SOURCE: Patient, sister, notes from his [facility] Ina, notes from the emergency room, laboratory and CTreports, CT images on PACS.  _________________________________   HISTORICAL  CHIEF COMPLAINT:  Chief Complaint  Patient presents with  . Convulsions    Denies further convulsive episodes.  Sts. h/a is daily, same severity.  Sts. memory loss is intermittent.  EEG was normal.  He has not had the MRI that was ordered at last ov--mot sure why--he thought his appt. today was for the mri./fim  . Headache  . Memory Loss    HISTORY OF PRESENT ILLNESS:  [name] Is a 55 year old man with convulsions,  headaches and memory disturbance.  Seizure:   He has not had any further seizures. An EEG performed last week was normal. There was no evidence of any epileptiform activity.  Seizure History:  He had a seizure around February 21, 2016.   He notes that he was very hot before the seizure and felt sick and threw up.  He felt lightheaded and fell fireplace, hitting the brick above (scratching head), he fell to the floor and continued to vomit and then had generalized shaking on/off x 4 - 5 hours.   He vomitted several times during that time and was responsive and communicative in between convulsions.     He fell asleep and was taken to his PCP the next day.    A CT and labs were ordered.     The CT had no acute findings.   Laboratory tests showed elevated TSH (47.2), mildly low Potassium (3.4), elevated ASt (89) but normal ALT, slightly elevated WBC 11.0.   Magnesium was normal.     Headache:  He continues to experience chronic daily headache that is mostly in the forehead. He does not note much change in pain when he moves his head. Generally, he wakes up with a headache but it worsens as the day goes on. Over-the-counter medications like Motrin and will take  the edge off for an hour or 2 but then the pain comes back as before. There is a small amount of neck pain but not much. There is no photophobia or phonophobia or nausea.    MRI is pending.    Memory:   He continues to experience difficulty with his memory. This started on the day he had the multiple convulsions. In general, he feels that memory has improved but not quite to baseline. He feels he does not remember much of June or early July.    Sleep:   He sleeps about 6.5 hours a night (11 PM-6 AM).   He falls asleep fairly rapidly and has a few awakenings at night. He does not feel refreshed in the morning. He wakes up with a headache. Mouth is dry when he wakes up.  However, his wife notes that he snores, worse on his back. He also has had witnessed OSA symptoms with gasping, snoring and pauses in his breathing while asleep. He has mild excessive daytime sleepiness and dozes off in front of the TV many evenings  EPWORTH SLEEPINESS SCALE  On a scale of 0 - 3 what is the chance of dozing:  Sitting and Reading:   1 Watching TV:    3 Sitting inactive in a public place: 1 Passenger in car for one hour: 3 Lying down to rest in the afternoon: 3 Sitting and talking to someone:  1 Sitting quietly after lunch:  0 In a car, stopped in traffic:  0  Total (out of 24):   12/24     Mild sleepiness     REVIEW OF SYSTEMS: Constitutional: No fevers, chills, sweats, or change in appetite.  He notes fatigue.  Insomnia some nights. Eyes: No  double vision, eye pain.  Occasional blurred vision. Ear, nose and throat: No hearing loss, ear pain, nasal congestion, sore throat Cardiovascular: No chest pain, palpitations Respiratory: No shortness of breath at rest or with exertion.   No wheezes GastrointestinaI: No nausea, vomiting, diarrhea, abdominal pain, fecal incontinence Genitourinary: No dysuria, urinary retention or frequency.  No nocturia. Musculoskeletal: Mild neck pain, back pain.   He has had  some cramps in his muscles at times and some joint pain. Integumentary: No rash, pruritus, skin lesions Neurological: as above Psychiatric: Denies depression at this time.  Notes mild anxiety Endocrine: No palpitations, diaphoresis, change in appetite, change in weigh or increased thirst Hematologic/Lymphatic: No anemia, purpura, petechiae. Allergic/Immunologic: No itchy/runny eyes, nasal congestion, recent allergic reactions, rashes  ALLERGIES: No Known Allergies  HOME MEDICATIONS:  Current Outpatient Prescriptions:  .  ALPRAZolam (XANAX) 0.5 MG tablet, Take 0.5 mg by mouth 2 (two) times daily., Disp: , Rfl:  .  levothyroxine (SYNTHROID, LEVOTHROID) 175 MCG tablet, 175 mcg daily., Disp: , Rfl:  .  meloxicam (MOBIC) 15 MG tablet, Take 1 tablet (15 mg total) by mouth daily., Disp: 30 tablet, Rfl: 3 .  olmesartan (BENICAR) 40 MG tablet, Take 40 mg by mouth daily., Disp: , Rfl:  .  sertraline (ZOLOFT) 100 MG tablet, Take 100 mg by mouth daily., Disp: , Rfl:   Current Facility-Administered Medications:  .  0.9 %  sodium chloride infusion, 500 mL, Intravenous, Continuous, Milus Banister, MD  PAST MEDICAL HISTORY: Past Medical History:  Diagnosis Date  . Anxiety and depression   . Hemorrhoids   . Hypertension   . Hypothyroidism   . Seizures ([place]) 02/2016    PAST SURGICAL HISTORY: Past Surgical History:  Procedure Laterality Date  . BICEPS TENDON REPAIR Right   . CARPAL TUNNEL RELEASE Bilateral   . KNEE CARTILAGE SURGERY Left   . KNEE SURGERY Right    x 2    FAMILY HISTORY: Family History  Problem Relation Age of Onset  . Hypertension Father   . Kidney disease Mother   . Breast cancer Paternal Grandmother   . Kidney failure Maternal Grandmother   . Multiple sclerosis Sister   . Heart attack Brother     x 3  . Heart defect Maternal Uncle     SOCIAL HISTORY:  Social History   Social History  . Marital status: Married    Spouse name: [name]  . Number of children:  1  . Years of education: 96   Occupational History  . construction     remodels houses   Social History Main Topics  . Smoking status: Current Every Day Smoker    Packs/day: 1.50    Types: Cigarettes  . Smokeless tobacco: Never Used  . Alcohol use 8.4 oz/week    14 Standard drinks or equivalent per week     Comment: 03/09/16 3 drinks a week  . Drug use: No  . Sexual activity: Not on file   Other Topics Concern  . Not on file   Social History Narrative   Lives with wife   Caffeine- coffee 2 cups daily     PHYSICAL EXAM  Vitals:  04/20/16 0857  BP: (!) 150/84  Pulse: 72  Resp: 16  Weight: 159 lb (72.1 kg)  Height: 5' 8.5" (1.74 m)    Body mass index is 23.82 kg/m.   General: The patient is well-developed and well-nourished and in no acute distress  Musculoskeletal:   The mid and lower neck are mildly tender. The splenius capitis muscles are mildly to moderately tender  Neurologic Exam  Mental status: The patient is alert and oriented x 3 at the time of the examination. The patient has apparent normal recent and remote memory, with an apparently normal attention span and concentration ability.   Speech is normal.  Cranial nerves: Extraocular movements are full. Symm facial sensation.  .Facial strength is normal.  Trapezius and sternocleidomastoid strength is normal. No dysarthria is noted.  The tongue is midline, and the patient has symmetric elevation of the soft palate. No obvious hearing deficits are noted.  Motor:  Muscle bulk is normal.   Tone is normal. Strength is  5 / 5 in all 4 extremities.   Sensory: Sensory testing:   He reports decreased temperature, soft touch and vibration sensationon the right compared to the left  Coordination: Cerebellar testing reveals good finger-nose-finger and heel-to-shin bilaterally.  Gait and station: Station is normal.   Gait is normal. Tandem gait is mildly wide. Romberg is negative.   Reflexes: Deep tendon reflexes  are symmetric and normal bilaterally.        DIAGNOSTIC DATA (LABS, IMAGING, TESTING) - I reviewed patient records, labs, notes, testing and imaging myself where available.  Lab Results  Component Value Date   WBC 18.2 (H) 12/18/2011   HGB 14.3 12/18/2011   HCT 41.6 12/18/2011   MCV 95.9 12/18/2011   PLT 242 12/18/2011      Component Value Date/Time   NA 142 12/18/2011 0019   K 4.0 12/18/2011 0019   CL 103 12/18/2011 0019   CO2 31 12/18/2011 0019   GLUCOSE 101 (H) 12/18/2011 0019   BUN 17 12/18/2011 0019   CREATININE 1.07 12/18/2011 0019   CALCIUM 9.8 12/18/2011 0019   PROT 6.9 12/18/2011 0019   ALBUMIN 3.9 12/18/2011 0019   AST 16 12/18/2011 0019   ALT 12 12/18/2011 0019   ALKPHOS 79 12/18/2011 0019   BILITOT 0.1 (L) 12/18/2011 0019   GFRNONAA 79 (L) 12/18/2011 0019   GFRAA >90 12/18/2011 0019       ASSESSMENT AND PLAN  Convulsions, unspecified convulsion type (HCC)  OSA (obstructive sleep apnea) - Plan: Split night study  Memory loss  Occipital headache  Excessive sleepiness - Plan: Split night study    1.    We will make sure MRI get scheduled for evaluation of seizure memory loss and we'll call him with the results performed. 2.    He has signs and symptoms of obstructive sleep apnea and we will set up a split-night study and treat with CPAP or an oral appliance as indicated. 3.    He is advised to get 7 hours of sleep nightly if possible. 4.,   As EEG is normal and the seizures occurred in the setting of severe vomiting and hitting his head, I will hold off on treatment at this time.   If another seizure occurs, we will start an AED long-term. 5.    If headaches worsen, consider adding a prophylactic agent and/or doing trigger point injection/nerve block      He will return to see me in 3 months or sooner if  there are new or worsening symptoms. We will of course call him with the results of the studies and see him sooner based on those results.   Thank  you for asking me to see Mr. Godina. Please let me know if I can be of further assistance with her or other patients in the future.  Chantry Headen A. Felecia Shelling, MD, PhD [id number], [id number] AM Certified in Neurology, Clinical Neurophysiology, Sleep Medicine, Pain Medicine and Neuroimaging  [facility] [address], [place], [place] [address] [phone]

## 2016-04-24 ENCOUNTER — Ambulatory Visit
Admission: RE | Admit: 2016-04-24 | Discharge: 2016-04-24 | Disposition: A | Discharge status: Home / Self Care | Payer: BLUE CROSS/BLUE SHIELD | Source: Ambulatory Visit | Attending: Neurology | Admitting: Neurology

## 2016-04-24 DIAGNOSIS — Z1389 Encounter for screening for other disorder: Secondary | ICD-10-CM

## 2016-04-26 ENCOUNTER — Ambulatory Visit (INDEPENDENT_AMBULATORY_CARE_PROVIDER_SITE_OTHER): Payer: BLUE CROSS/BLUE SHIELD

## 2016-04-26 DIAGNOSIS — R519 Headache, unspecified: Secondary | ICD-10-CM

## 2016-04-26 DIAGNOSIS — R51 Headache: Secondary | ICD-10-CM

## 2016-04-26 DIAGNOSIS — R413 Other amnesia: Secondary | ICD-10-CM

## 2016-04-26 DIAGNOSIS — R569 Unspecified convulsions: Secondary | ICD-10-CM

## 2016-04-27 MED ORDER — GADOPENTETATE DIMEGLUMINE 469.01 MG/ML IV SOLN: 15.0000 mL | Freq: Once | INTRAVENOUS | Status: AC | PRN

## 2016-04-28 ENCOUNTER — Telehealth: Payer: Self-pay | Admitting: *Deleted

## 2016-04-28 MED ORDER — METHYLPREDNISOLONE 4 MG PO TBPK: ORAL_TABLET | ORAL | 0 refills | Status: DC

## 2016-04-28 NOTE — Telephone Encounter (Signed)
I have spoken with [name] and per RAS, advised MRI brain showed mild age related changes; nothing worrisome, also some fluid in sinus cavity, near ears.  RAS would like pt. to take oral steroids for this.  Pt. verbalized understanding of same and is agreeable.  Rx. escribed to [facility] per pt's requestf/im

## 2016-04-28 NOTE — Telephone Encounter (Signed)
-----   Message from Britt Bottom, MD sent at 04/28/2016  9:42 AM EDT ----- Please let him know that he had mild age related changes in the brain but nothing worrisome. There is some fluid in the sinuses next to the ears (mastoids) and I would like him to take a steroid pack.

## 2016-05-13 ENCOUNTER — Encounter (HOSPITAL_COMMUNITY): Payer: Self-pay | Admitting: Emergency Medicine

## 2016-05-13 ENCOUNTER — Emergency Department (HOSPITAL_COMMUNITY)
Admission: EM | Admit: 2016-05-13 | Discharge: 2016-05-14 | Disposition: A | Discharge status: Other Acute Inpatient Hospital | Payer: BLUE CROSS/BLUE SHIELD | Attending: Emergency Medicine | Admitting: Emergency Medicine

## 2016-05-13 DIAGNOSIS — F332 Major depressive disorder, recurrent severe without psychotic features: Secondary | ICD-10-CM | POA: Insufficient documentation

## 2016-05-13 DIAGNOSIS — F17210 Nicotine dependence, cigarettes, uncomplicated: Secondary | ICD-10-CM | POA: Insufficient documentation

## 2016-05-13 DIAGNOSIS — F1020 Alcohol dependence, uncomplicated: Secondary | ICD-10-CM | POA: Diagnosis not present

## 2016-05-13 DIAGNOSIS — Z046 Encounter for general psychiatric examination, requested by authority: Secondary | ICD-10-CM | POA: Diagnosis present

## 2016-05-13 DIAGNOSIS — Y929 Unspecified place or not applicable: Secondary | ICD-10-CM | POA: Diagnosis not present

## 2016-05-13 DIAGNOSIS — X58XXXA Exposure to other specified factors, initial encounter: Secondary | ICD-10-CM | POA: Diagnosis not present

## 2016-05-13 DIAGNOSIS — Y999 Unspecified external cause status: Secondary | ICD-10-CM | POA: Diagnosis not present

## 2016-05-13 DIAGNOSIS — R45851 Suicidal ideations: Secondary | ICD-10-CM | POA: Diagnosis not present

## 2016-05-13 DIAGNOSIS — I10 Essential (primary) hypertension: Secondary | ICD-10-CM | POA: Insufficient documentation

## 2016-05-13 DIAGNOSIS — E039 Hypothyroidism, unspecified: Secondary | ICD-10-CM | POA: Insufficient documentation

## 2016-05-13 DIAGNOSIS — Y939 Activity, unspecified: Secondary | ICD-10-CM | POA: Diagnosis not present

## 2016-05-13 DIAGNOSIS — Z79899 Other long term (current) drug therapy: Secondary | ICD-10-CM | POA: Insufficient documentation

## 2016-05-13 DIAGNOSIS — S60519A Abrasion of unspecified hand, initial encounter: Secondary | ICD-10-CM | POA: Insufficient documentation

## 2016-05-13 LAB — CBC
HEMATOCRIT: 43.4 % (ref 39.0–52.0)
Hemoglobin: 15.3 g/dL (ref 13.0–17.0)
MCH: 34.5 pg — AB (ref 26.0–34.0)
MCHC: 35.3 g/dL (ref 30.0–36.0)
MCV: 97.7 fL (ref 78.0–100.0)
PLATELETS: 215 10*3/uL (ref 150–400)
RBC: 4.44 MIL/uL (ref 4.22–5.81)
RDW: 15.1 % (ref 11.5–15.5)
WBC: 12.3 10*3/uL — AB (ref 4.0–10.5)

## 2016-05-13 LAB — RAPID URINE DRUG SCREEN, HOSP PERFORMED
Amphetamines: NOT DETECTED
BARBITURATES: NOT DETECTED
Benzodiazepines: POSITIVE — AB
Cocaine: NOT DETECTED
Opiates: NOT DETECTED
TETRAHYDROCANNABINOL: NOT DETECTED

## 2016-05-13 LAB — COMPREHENSIVE METABOLIC PANEL
ALK PHOS: 76 U/L (ref 38–126)
ALT: 9 U/L — AB (ref 17–63)
AST: 16 U/L (ref 15–41)
Albumin: 4.5 g/dL (ref 3.5–5.0)
Anion gap: 11 (ref 5–15)
BUN: 10 mg/dL (ref 6–20)
CALCIUM: 9.4 mg/dL (ref 8.9–10.3)
CHLORIDE: 108 mmol/L (ref 101–111)
CO2: 26 mmol/L (ref 22–32)
CREATININE: 0.99 mg/dL (ref 0.61–1.24)
Glucose, Bld: 98 mg/dL (ref 65–99)
Potassium: 4 mmol/L (ref 3.5–5.1)
SODIUM: 145 mmol/L (ref 135–145)
Total Bilirubin: 0.3 mg/dL (ref 0.3–1.2)
Total Protein: 7.6 g/dL (ref 6.5–8.1)

## 2016-05-13 LAB — ETHANOL: Alcohol, Ethyl (B): 259 mg/dL — ABNORMAL HIGH (ref <5)

## 2016-05-13 MED ORDER — HYDROGEN PEROXIDE 3 % EX SOLN
CUTANEOUS | Status: AC
  Filled 2016-05-13: qty 473

## 2016-05-13 NOTE — ED Provider Notes (Signed)
[facility] DEPT Provider Note   CSN: [id number] Arrival date & time: 05/13/16  1506     History   Chief Complaint Chief Complaint  Patient presents with  . V70.1    HPI [name] is a 55 y.o. male.  He presents in custody from home. Cavity by rocking in [facility] Per patient's history, sheriff's report, and from patient's wife reported to law enforcement he has been drinking since last night. Patient states that he drinks most days. He denies that he was drinking "heavily.  He was pushing things at home. Became agitated. He had a pistol. He states that he never took it out of the store container that his wife took it from him. Wife told sure if she was concerned he would kill himself. When asked about this he states "I would". He states "I just don't give a shit".    HPI  Past Medical History:  Diagnosis Date  . Anxiety and depression   . Hemorrhoids   . Hypertension   . Hypothyroidism   . Seizures ([place]) 02/2016    Patient Active Problem List   Diagnosis Date Noted  . OSA (obstructive sleep apnea) 04/20/2016  . Excessive sleepiness 04/20/2016  . Convulsions ([place]) 03/09/2016  . Occipital headache 03/09/2016  . Memory loss 03/09/2016  . Costochondral chest pain 03/09/2016    Past Surgical History:  Procedure Laterality Date  . BICEPS TENDON REPAIR Right   . CARPAL TUNNEL RELEASE Bilateral   . KNEE CARTILAGE SURGERY Left   . KNEE SURGERY Right    x 2       Home Medications    Prior to Admission medications   Medication Sig Start Date End Date Taking? Authorizing Provider  ALPRAZolam Duanne Moron) 0.5 MG tablet Take 0.5 mg by mouth 2 (two) times daily.   Yes Historical Provider, MD  DiphenhydrAMINE HCl, Sleep, (ZZZQUIL) 25 MG CAPS Take 50 mg by mouth at bedtime as needed (for sleep).   Yes Historical Provider, MD  fluticasone (FLONASE) 50 MCG/ACT nasal spray Place 2 sprays into both nostrils daily as needed for rhinitis.   Yes Historical Provider, MD    levothyroxine (SYNTHROID, LEVOTHROID) 150 MCG tablet Take 150 mcg by mouth daily before breakfast.   Yes Historical Provider, MD  meloxicam (MOBIC) 15 MG tablet Take 1 tablet (15 mg total) by mouth daily. 03/09/16  Yes Britt Bottom, MD  olmesartan (BENICAR) 40 MG tablet Take 40 mg by mouth daily.   Yes Historical Provider, MD  sertraline (ZOLOFT) 100 MG tablet Take 100 mg by mouth 2 (two) times daily.    Yes Historical Provider, MD    Family History Family History  Problem Relation Age of Onset  . Hypertension Father   . Kidney disease Mother   . Breast cancer Paternal Grandmother   . Kidney failure Maternal Grandmother   . Multiple sclerosis Sister   . Heart attack Brother     x 3  . Heart defect Maternal Uncle     Social History Social History  Substance Use Topics  . Smoking status: Current Every Day Smoker    Packs/day: 1.50    Types: Cigarettes  . Smokeless tobacco: Never Used  . Alcohol use 8.4 oz/week    14 Standard drinks or equivalent per week     Comment: pt had fifth last night, states 1/2 fifth so far today     Allergies   Review of patient's allergies indicates no known allergies.   Review of  Systems Review of Systems  Constitutional: Negative for appetite change, chills, diaphoresis, fatigue and fever.  HENT: Negative for mouth sores, sore throat and trouble swallowing.   Eyes: Negative for visual disturbance.  Respiratory: Negative for cough, chest tightness, shortness of breath and wheezing.   Cardiovascular: Negative for chest pain.  Gastrointestinal: Negative for abdominal distention, abdominal pain, diarrhea, nausea and vomiting.  Endocrine: Negative for polydipsia, polyphagia and polyuria.  Genitourinary: Negative for dysuria, frequency and hematuria.  Musculoskeletal: Negative for gait problem.  Skin: Negative for color change, pallor and rash.  Neurological: Negative for dizziness, syncope, light-headedness and headaches.  Hematological: Does  not bruise/bleed easily.  Psychiatric/Behavioral: Negative for behavioral problems and confusion.     Physical Exam Updated Vital Signs BP 166/100 (BP Location: Left Arm)   Pulse 79   Temp 98.1 F (36.7 C) (Oral)   Resp 16   Ht 5\' 9"  (1.753 m)   Wt 140 lb (63.5 kg)   SpO2 100%   BMI 20.67 kg/m   Physical Exam  Constitutional: He is oriented to person, place, and time. He appears well-developed and well-nourished. No distress.  HENT:  Head: Normocephalic.  Eyes: Conjunctivae are normal. Pupils are equal, round, and reactive to light. No scleral icterus.  Neck: Normal range of motion. Neck supple. No thyromegaly present.  Cardiovascular: Normal rate and regular rhythm.  Exam reveals no gallop and no friction rub.   No murmur heard. Pulmonary/Chest: Effort normal and breath sounds normal. No respiratory distress. He has no wheezes. He has no rales.  Abdominal: Soft. Bowel sounds are normal. He exhibits no distension. There is no tenderness. There is no rebound.  Musculoskeletal: Normal range of motion.  Abrasions to his knuckles. Full range of motion. Denies pain.  Neurological: He is alert and oriented to person, place, and time.  Skin: Skin is warm and dry. No rash noted.  Psychiatric: He has a normal mood and affect. His behavior is normal.     ED Treatments / Results  Labs (all labs ordered are listed, but only abnormal results are displayed) Labs Reviewed  COMPREHENSIVE METABOLIC PANEL - Abnormal; Notable for the following:       Result Value   ALT 9 (*)    All other components within normal limits  ETHANOL - Abnormal; Notable for the following:    Alcohol, Ethyl (B) 259 (*)    All other components within normal limits  CBC - Abnormal; Notable for the following:    WBC 12.3 (*)    MCH 34.5 (*)    All other components within normal limits  URINE RAPID DRUG SCREEN, HOSP PERFORMED - Abnormal; Notable for the following:    Benzodiazepines POSITIVE (*)    All other  components within normal limits    EKG  EKG Interpretation None       Radiology No results found.  Procedures Procedures (including critical care time)  Medications Ordered in ED Medications  hydrogen peroxide 3 % external solution (  Not Given 05/13/16 1658)     Initial Impression / Assessment and Plan / ED Course  I have reviewed the triage vital signs and the nursing notes.  Pertinent labs & imaging results that were available during my care of the patient were reviewed by me and considered in my medical decision making (see chart for details).  Clinical Course    Less for TTS evaluation. Medical clearance labs pending.   Alcohol is .257. Patient placed under IVC. I received a call  from behavioral health. We will reassess after his alcohol level improved. Currently meets criteria for inpatient care they will start seeking placement  Final Clinical Impressions(s) / ED Diagnoses   Final diagnoses:  Suicidal ideation    New Prescriptions New Prescriptions   No medications on file     Tanna Furry, MD 05/13/16 2130

## 2016-05-13 NOTE — ED Notes (Signed)
Pt refusing food or liquids.  Cooperative at this time.  Officer remains at bedside, but pt out of handcuffs.

## 2016-05-13 NOTE — ED Notes (Signed)
Pt's wife, [name], called stating if any questions or information needed to call her at [phone]

## 2016-05-13 NOTE — ED Triage Notes (Signed)
Pt brought in by law enforcement. Pt hitting things at home, intoxicated. Wife felt that pt was suicidal.

## 2016-05-13 NOTE — BH Assessment (Signed)
Assessment Note  [name] is an 55 y.o. male who was transported to [facility] by [facility] Dept.   Patient presented handcuffed to bed.  He reports current SI with a plan to shoot self in head.  Patient made gestures of shooting self in head.  Patient reports having 4 guns at home, 2 shot guns in a unlocked gun cabinet, one pistol in a locked gun safe, and a 357 magnum "on the ready."  Patient reports recurrent SI because he is unable to get a job and has an upcoming court date for syntectic Cannabis DWI.  Patient denied HI or AVH.  Patient reports consuming a 5th of liquor on either Friday or Saturday nights for the past several years.  He reports a history of 7 alcohol DUIs.  Patient also reports smoking Cannabis with last use 2 months ago.  Patient reports feeling stressed out about not having employment, finances, and Spouse having several surgeries.  He reports receiving counseling for stress in 2016.  Patient reports sleeping 4 hours per night and feeling like eating 2 days out of 7.  Patient reports being prescribed Xanax by PCP of (2) .5  mg tablets per day.  He reports misusing prescribed Xanax and taking sometimes 7 at a time and always running out of meds before 30 days.  Patient reports wanting to be discharged "so I can go home and shoot myself in the head."  Patient denied any previous residential substance use treatment or inpatient psychiatric hospitalizations.  Patient meets inpatient criteria, BAL will need to be lower before placement and IVC if Patient is threating to leave facility.       Diagnosis: Major Depressive Disorder, severe, recurrent; Alcohol Use Disorder, severe  Past Medical History:  Past Medical History:  Diagnosis Date  . Anxiety and depression   . Hemorrhoids   . Hypertension   . Hypothyroidism   . Seizures ([place]) 02/2016    Past Surgical History:  Procedure Laterality Date  . BICEPS TENDON REPAIR Right   . CARPAL TUNNEL RELEASE Bilateral   . KNEE  CARTILAGE SURGERY Left   . KNEE SURGERY Right    x 2    Family History:  Family History  Problem Relation Age of Onset  . Hypertension Father   . Kidney disease Mother   . Breast cancer Paternal Grandmother   . Kidney failure Maternal Grandmother   . Multiple sclerosis Sister   . Heart attack Brother     x 3  . Heart defect Maternal Uncle     Social History:  reports that he has been smoking Cigarettes.  He has been smoking about 1.50 packs per day. He has never used smokeless tobacco. He reports that he drinks about 8.4 oz of alcohol per week . He reports that he does not use drugs.  Additional Social History:     CIWA: CIWA-Ar BP: 166/100 Pulse Rate: 79 COWS:    Allergies: No Known Allergies  Home Medications:  (Not in a hospital admission)  OB/GYN Status:  No LMP for male patient.  General Assessment Data Location of Assessment: [facility] TTS Assessment: In system Is this a Tele or Face-to-Face Assessment?: Tele Assessment Is this an Initial Assessment or a Re-assessment for this encounter?: Initial Assessment Marital status: Married [place]var[place] name: N/A Is patient pregnant?: No Pregnancy Status:  (N/A) Living Arrangements: Spouse/significant other Can pt return to current living arrangement?: Yes Admission Status: Voluntary Is patient capable of signing voluntary admission?: No Referral  Source: Other ([facility]) Insurance type: [employer] Screening Exam ([place]) Medical Exam completed: Yes  Crisis Care Plan Living Arrangements: Spouse/significant other Legal Guardian: Other: (Self) Name of Psychiatrist: None Name of Therapist: None  Education Status Is patient currently in school?: No Current Grade: N/A Highest grade of school patient has completed: 12th Name of school: N/A Contact person: N/A  Risk to self with the past 6 months Suicidal Ideation: Yes-Currently Present Has patient been a risk to self  within the past 6 months prior to admission? : Yes Suicidal Intent: Yes-Currently Present Has patient had any suicidal intent within the past 6 months prior to admission? : Yes Is patient at risk for suicide?: Yes Suicidal Plan?: Yes-Currently Present Has patient had any suicidal plan within the past 6 months prior to admission? : Yes Specify Current Suicidal Plan: Shot self in head with gun Access to Means: Yes Specify Access to Suicidal Means: Patient reports 4 guns at home What has been your use of drugs/alcohol within the last 12 months?: Alcohol Previous Attempts/Gestures: No How many times?: 0 Other Self Harm Risks: No Triggers for Past Attempts: None known Intentional Self Injurious Behavior: None Family Suicide History: Unknown Recent stressful life event(s): Job Loss, Chemical engineer, Financial Problems Persecutory voices/beliefs?: No Depression: Yes Depression Symptoms: Feeling angry/irritable, Feeling worthless/self pity, Insomnia Substance abuse history and/or treatment for substance abuse?: Yes Suicide prevention information given to non-admitted patients: Not applicable  Risk to Others within the past 6 months Homicidal Ideation: No Does patient have any lifetime risk of violence toward others beyond the six months prior to admission? : No Thoughts of Harm to Others: No Current Homicidal Intent: No Current Homicidal Plan: No Access to Homicidal Means: No Identified Victim: N/A History of harm to others?: No Assessment of Violence: On admission Violent Behavior Description: Patient was pushing things around home, current handcuff to bed Does patient have access to weapons?: Yes (Comment) Criminal Charges Pending?: Yes Describe Pending Criminal Charges: DWI for syntehic Cannabis Does patient have a court date: Yes Court Date: 06/02/16 Is patient on probation?: No  Psychosis Hallucinations: None noted Delusions: None noted  Mental Status  Report Appearance/Hygiene: In scrubs Eye Contact: Fair Motor Activity: Restlessness Speech: Logical/coherent, Slurred Level of Consciousness: Alert Mood: Depressed, Anxious Affect: Anxious, Irritable, Depressed Anxiety Level: Moderate Thought Processes: Coherent, Relevant, Circumstantial Judgement: Impaired Orientation: Person, Time, Place Obsessive Compulsive Thoughts/Behaviors: None  Cognitive Functioning Concentration: Decreased Memory: Recent Intact, Remote Intact IQ: Average Insight: Poor Impulse Control: Poor Appetite: Poor Weight Loss: 0 Weight Gain: 0 Sleep: Decreased Total Hours of Sleep: 4 Vegetative Symptoms: None  ADLScreening [facility] Assessment Services) Patient's cognitive ability adequate to safely complete daily activities?: Yes Patient able to express need for assistance with ADLs?: Yes Independently performs ADLs?: Yes (appropriate for developmental age)  Prior Inpatient Therapy Prior Inpatient Therapy: No Prior Therapy Dates: N/A Prior Therapy Facilty/Provider(s): N/A Reason for Treatment: N/A  Prior Outpatient Therapy Prior Outpatient Therapy: Yes Prior Therapy Dates: 2016 (3x ) Prior Therapy Facilty/Provider(s): [facility] Reason for Treatment: Stress Does patient have an ACCT team?: No Does patient have Intensive In-House Services?  : No Does patient have [facility] services? : No Does patient have P4CC services?: No  ADL Screening (condition at time of admission) Patient's cognitive ability adequate to safely complete daily activities?: Yes Is the patient deaf or have difficulty hearing?: No Does the patient have difficulty seeing, even when wearing glasses/contacts?: No Does the patient have  difficulty concentrating, remembering, or making decisions?: No Patient able to express need for assistance with ADLs?: Yes Does the patient have difficulty dressing or bathing?: No Independently performs ADLs?: Yes (appropriate for developmental age) Does  the patient have difficulty walking or climbing stairs?: No Weakness of Legs: None Weakness of Arms/Hands: None  Home Assistive Devices/Equipment Home Assistive Devices/Equipment: None    Abuse/Neglect Assessment (Assessment to be complete while patient is alone) Physical Abuse: Denies Verbal Abuse: Denies Sexual Abuse: Denies Exploitation of patient/patient's resources: Denies Self-Neglect: Denies Values / Beliefs Cultural Requests During Hospitalization: None Spiritual Requests During Hospitalization: None   Advance Directives (For Healthcare) Does patient have an advance directive?: No Would patient like information on creating an advanced directive?: No - patient declined information    Additional Information 1:1 In Past 12 Months?: No CIRT Risk: Yes Elopement Risk: Yes Does patient have medical clearance?: Yes     Disposition:  Disposition Initial Assessment Completed for this Encounter: Yes Disposition of Patient: Inpatient treatment program, Other dispositions (BAL need to decrease) Type of inpatient treatment program: Adult Other disposition(s): Other (Comment)  On Site Evaluation by:   Reviewed with Physician:    Dey-Johnson,Garnell Begeman 05/13/2016 5:18 PM

## 2016-05-14 ENCOUNTER — Inpatient Hospital Stay (HOSPITAL_COMMUNITY)
Admission: AD | Admit: 2016-05-14 | Discharge: 2016-05-15 | DRG: 897 | Disposition: A | Discharge status: Home / Self Care | Payer: BLUE CROSS/BLUE SHIELD | Attending: Psychiatry | Admitting: Psychiatry

## 2016-05-14 ENCOUNTER — Encounter (HOSPITAL_COMMUNITY): Payer: Self-pay | Admitting: *Deleted

## 2016-05-14 DIAGNOSIS — Z803 Family history of malignant neoplasm of breast: Secondary | ICD-10-CM | POA: Diagnosis not present

## 2016-05-14 DIAGNOSIS — Z9889 Other specified postprocedural states: Secondary | ICD-10-CM | POA: Diagnosis not present

## 2016-05-14 DIAGNOSIS — F1020 Alcohol dependence, uncomplicated: Secondary | ICD-10-CM | POA: Diagnosis present

## 2016-05-14 DIAGNOSIS — Z820 Family history of epilepsy and other diseases of the nervous system: Secondary | ICD-10-CM

## 2016-05-14 DIAGNOSIS — Z841 Family history of disorders of kidney and ureter: Secondary | ICD-10-CM | POA: Diagnosis not present

## 2016-05-14 DIAGNOSIS — R413 Other amnesia: Secondary | ICD-10-CM | POA: Diagnosis present

## 2016-05-14 DIAGNOSIS — Z8249 Family history of ischemic heart disease and other diseases of the circulatory system: Secondary | ICD-10-CM | POA: Diagnosis not present

## 2016-05-14 DIAGNOSIS — I10 Essential (primary) hypertension: Secondary | ICD-10-CM | POA: Diagnosis present

## 2016-05-14 DIAGNOSIS — F17210 Nicotine dependence, cigarettes, uncomplicated: Secondary | ICD-10-CM | POA: Diagnosis present

## 2016-05-14 DIAGNOSIS — Z79899 Other long term (current) drug therapy: Secondary | ICD-10-CM | POA: Diagnosis not present

## 2016-05-14 DIAGNOSIS — G4733 Obstructive sleep apnea (adult) (pediatric): Secondary | ICD-10-CM | POA: Diagnosis present

## 2016-05-14 DIAGNOSIS — F332 Major depressive disorder, recurrent severe without psychotic features: Secondary | ICD-10-CM | POA: Diagnosis present

## 2016-05-14 HISTORY — DX: Anxiety disorder, unspecified: F41.9

## 2016-05-14 HISTORY — DX: Major depressive disorder, single episode, unspecified: F32.9

## 2016-05-14 HISTORY — DX: Depression, unspecified: F32.A

## 2016-05-14 MED ORDER — CHLORDIAZEPOXIDE HCL 25 MG PO CAPS: 25.0000 mg | ORAL_CAPSULE | Freq: Every day | ORAL | Status: DC

## 2016-05-14 MED ORDER — LEVOTHYROXINE SODIUM 150 MCG PO TABS
150.0000 ug | ORAL_TABLET | Freq: Every day | ORAL | Status: DC
  Administered 2016-05-15: 150 ug via ORAL
  Filled 2016-05-14 (×4): qty 1

## 2016-05-14 MED ORDER — ONDANSETRON 4 MG PO TBDP: 4.0000 mg | ORAL_TABLET | Freq: Four times a day (QID) | ORAL | Status: DC | PRN

## 2016-05-14 MED ORDER — THIAMINE HCL 100 MG/ML IJ SOLN
100.0000 mg | Freq: Once | INTRAMUSCULAR | Status: AC
  Administered 2016-05-14: 100 mg via INTRAMUSCULAR
  Filled 2016-05-14: qty 2

## 2016-05-14 MED ORDER — VITAMIN B-1 100 MG PO TABS
100.0000 mg | ORAL_TABLET | Freq: Every day | ORAL | Status: DC
  Filled 2016-05-14 (×4): qty 1

## 2016-05-14 MED ORDER — FLUTICASONE PROPIONATE 50 MCG/ACT NA SUSP: 2.0000 | Freq: Every day | NASAL | Status: DC | PRN

## 2016-05-14 MED ORDER — IRBESARTAN 300 MG PO TABS
300.0000 mg | ORAL_TABLET | Freq: Every day | ORAL | Status: DC
  Administered 2016-05-15: 300 mg via ORAL
  Filled 2016-05-14 (×4): qty 1

## 2016-05-14 MED ORDER — FLUTICASONE PROPIONATE 50 MCG/ACT NA SUSP
2.0000 | Freq: Every day | NASAL | Status: DC | PRN
  Filled 2016-05-14: qty 16

## 2016-05-14 MED ORDER — ADULT MULTIVITAMIN W/MINERALS CH
1.0000 | ORAL_TABLET | Freq: Every day | ORAL | Status: DC
  Administered 2016-05-14: 1 via ORAL
  Filled 2016-05-14 (×5): qty 1

## 2016-05-14 MED ORDER — TRAZODONE HCL 50 MG PO TABS
50.0000 mg | ORAL_TABLET | Freq: Every evening | ORAL | Status: DC | PRN
  Filled 2016-05-14: qty 1

## 2016-05-14 MED ORDER — LEVOTHYROXINE SODIUM 50 MCG PO TABS
150.0000 ug | ORAL_TABLET | Freq: Every day | ORAL | Status: DC
  Administered 2016-05-14: 150 ug via ORAL
  Filled 2016-05-14: qty 3

## 2016-05-14 MED ORDER — HYDROXYZINE HCL 25 MG PO TABS
25.0000 mg | ORAL_TABLET | Freq: Four times a day (QID) | ORAL | Status: DC | PRN
  Administered 2016-05-14: 25 mg via ORAL
  Filled 2016-05-14: qty 1

## 2016-05-14 MED ORDER — CHLORDIAZEPOXIDE HCL 25 MG PO CAPS
25.0000 mg | ORAL_CAPSULE | Freq: Four times a day (QID) | ORAL | Status: AC
  Administered 2016-05-14: 25 mg via ORAL
  Filled 2016-05-14 (×3): qty 1

## 2016-05-14 MED ORDER — ENSURE ENLIVE PO LIQD: 237.0000 mL | Freq: Two times a day (BID) | ORAL | Status: DC

## 2016-05-14 MED ORDER — MAGNESIUM HYDROXIDE 400 MG/5ML PO SUSP: 30.0000 mL | Freq: Every day | ORAL | Status: DC | PRN

## 2016-05-14 MED ORDER — CHLORDIAZEPOXIDE HCL 25 MG PO CAPS: 25.0000 mg | ORAL_CAPSULE | ORAL | Status: DC

## 2016-05-14 MED ORDER — CLONIDINE HCL 0.2 MG PO TABS
0.2000 mg | ORAL_TABLET | Freq: Once | ORAL | Status: AC
  Administered 2016-05-14: 0.2 mg via ORAL
  Filled 2016-05-14: qty 1
  Filled 2016-05-14: qty 2

## 2016-05-14 MED ORDER — NICOTINE 21 MG/24HR TD PT24
21.0000 mg | MEDICATED_PATCH | Freq: Every day | TRANSDERMAL | Status: DC
Start: 2016-05-14 — End: 2016-05-17
  Filled 2016-05-14 (×5): qty 1

## 2016-05-14 MED ORDER — IRBESARTAN 300 MG PO TABS
300.0000 mg | ORAL_TABLET | Freq: Every day | ORAL | Status: DC
  Administered 2016-05-14: 300 mg via ORAL
  Filled 2016-05-14 (×2): qty 1

## 2016-05-14 MED ORDER — CHLORDIAZEPOXIDE HCL 25 MG PO CAPS: 25.0000 mg | ORAL_CAPSULE | Freq: Four times a day (QID) | ORAL | Status: DC | PRN

## 2016-05-14 MED ORDER — ALUM & MAG HYDROXIDE-SIMETH 200-200-20 MG/5ML PO SUSP: 30.0000 mL | ORAL | Status: DC | PRN

## 2016-05-14 MED ORDER — ACETAMINOPHEN 325 MG PO TABS: 650.0000 mg | ORAL_TABLET | Freq: Four times a day (QID) | ORAL | Status: DC | PRN

## 2016-05-14 MED ORDER — LOPERAMIDE HCL 2 MG PO CAPS: 2.0000 mg | ORAL_CAPSULE | ORAL | Status: DC | PRN

## 2016-05-14 MED ORDER — CHLORDIAZEPOXIDE HCL 25 MG PO CAPS: 25.0000 mg | ORAL_CAPSULE | Freq: Three times a day (TID) | ORAL | Status: AC

## 2016-05-14 NOTE — ED Notes (Signed)
Offered breakfast patient declined.

## 2016-05-14 NOTE — Plan of Care (Signed)
Problem: Education: Goal: Knowledge of the prescribed therapeutic regimen will improve Outcome: Progressing Nurse discussed depression/anxiety/panic/coping skills with patient.

## 2016-05-14 NOTE — Progress Notes (Addendum)
Patient upset because he wants to be discharged asap.  Refused to take all medications.  Charge nurse and 2 nurses have talked to patient to calm him.  Stated he wants to see MD and be discharged today.  PA informed also.  AC talked to patient.  Patient stated if I can't leave, somebody better call my wife and tell her to come up here and get me.  Patient angry and stomped away from medication window.

## 2016-05-14 NOTE — ED Notes (Signed)
Per Tim at [facility] repeat TTS needed, currently seeking inpatient placement.

## 2016-05-14 NOTE — ED Notes (Signed)
RN offered pt Nicotene Patch. Pt refused.

## 2016-05-14 NOTE — BHH Counselor (Signed)
Pt accepted to [facility]. Pt accepted to 307-[address] by Dr. Parke Poisson. Report number [phone].  Pt can come at this time.   Lorenza Cambridge, [facility] Triage Specialist

## 2016-05-14 NOTE — BHH Counselor (Signed)
Clinical Social Work Note  Attempted to see patient to complete Psychosocial Assessment, but he was sleeping soundly.  Selmer Dominion, LCSW 05/14/2016, 4:08 PM

## 2016-05-14 NOTE — Progress Notes (Signed)
Patient did attend the evening speaker AA meeting.  

## 2016-05-14 NOTE — ED Notes (Signed)
Per report from [place] night shift-pt was calm and cooperative throughout the night and after LEO left the bedside.   This am pt has been calm and cooperative. Requested water and coffee to drink. Pt understands that he is under IVC and we are currently seeking inpatient placement.
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
## 2016-05-14 NOTE — ED Notes (Signed)
RN notified pt wife Natividad Hamade that pt will be transferred to [facility].

## 2016-05-14 NOTE — Tx Team (Signed)
Initial Treatment Plan 05/14/2016 4:02 PM [name] [id number]    PATIENT STRESSORS: Financial difficulties Marital or family conflict Medication change or noncompliance Occupational concerns Substance abuse Traumatic event   PATIENT STRENGTHS: Average or above average intelligence Capable of independent living Communication skills General fund of knowledge Motivation for treatment/growth Supportive family/friends   PATIENT IDENTIFIED PROBLEMS: "alcohol abuse"  "depression"  "anxiety"  "family problems"               DISCHARGE CRITERIA:  Ability to meet basic life and health needs Adequate post-discharge living arrangements Improved stabilization in mood, thinking, and/or behavior Medical problems require only outpatient monitoring Motivation to continue treatment in a less acute level of care Need for constant or close observation no longer present Reduction of life-threatening or endangering symptoms to within safe limits Safe-care adequate arrangements made Verbal commitment to aftercare and medication compliance Withdrawal symptoms are absent or subacute and managed without 24-hour nursing intervention  PRELIMINARY DISCHARGE PLAN: Attend aftercare/continuing care group Attend PHP/IOP Attend 12-step recovery group Outpatient therapy Participate in family therapy Return to previous living arrangement  PATIENT/FAMILY INVOLVEMENT: This treatment plan has been presented to and reviewed with the patient, [name].  The patient and family have been given the opportunity to ask questions and make suggestions.  Grayland Ormond Varna, RN 05/14/2016, 4:02 PM

## 2016-05-15 DIAGNOSIS — Z79899 Other long term (current) drug therapy: Secondary | ICD-10-CM

## 2016-05-15 DIAGNOSIS — Z841 Family history of disorders of kidney and ureter: Secondary | ICD-10-CM

## 2016-05-15 DIAGNOSIS — F1020 Alcohol dependence, uncomplicated: Principal | ICD-10-CM

## 2016-05-15 DIAGNOSIS — Z8249 Family history of ischemic heart disease and other diseases of the circulatory system: Secondary | ICD-10-CM

## 2016-05-15 DIAGNOSIS — Z9889 Other specified postprocedural states: Secondary | ICD-10-CM

## 2016-05-15 DIAGNOSIS — Z803 Family history of malignant neoplasm of breast: Secondary | ICD-10-CM

## 2016-05-15 MED ORDER — TRAZODONE HCL 50 MG PO TABS: 50.0000 mg | ORAL_TABLET | Freq: Every evening | ORAL | 0 refills | Status: DC | PRN

## 2016-05-15 MED ORDER — LEVOTHYROXINE SODIUM 150 MCG PO TABS: 150.0000 ug | ORAL_TABLET | Freq: Every day | ORAL | 0 refills | Status: DC

## 2016-05-15 MED ORDER — NICOTINE 21 MG/24HR TD PT24: 21.0000 mg | MEDICATED_PATCH | Freq: Every day | TRANSDERMAL | 0 refills | Status: DC

## 2016-05-15 MED ORDER — HYDROXYZINE HCL 25 MG PO TABS: 25.0000 mg | ORAL_TABLET | Freq: Four times a day (QID) | ORAL | 0 refills | Status: DC | PRN

## 2016-05-15 NOTE — Progress Notes (Signed)
Recreation Therapy Notes  Date: 05/15/16 Time: 0930 Location: [address] Hall Group Room  Group Topic: Stress Management  Goal Area(s) Addresses:  Patient will verbalize importance of using healthy stress management.  Patient will identify positive emotions associated with healthy stress management.   Intervention: Guided Imagery  Activity :  Depression Imagery.  LRT introduced the technique of guided imagery to the patients.  LRT read a script allowing patients to participate in the activity.  Patients were to follow along as LRT read script.  Education:  Stress Management, Discharge Planning.   Education Outcome: Acknowledges edcuation/In group clarification offered/Needs additional education  Clinical Observations/Feedback: Pt did not attend group.     Victorino Sparrow, LRT/CTRS         Ria Comment, [place] Dominique A 05/15/2016 12:00 PM
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
## 2016-05-15 NOTE — [facility] Counselor (Signed)
No PSA, less than 72 hour admission.   Unable to complete PSA prior to discharge.  Edwyna Shell, LCSW Lead Clinical Social Worker Phone:  [phone]

## 2016-05-15 NOTE — Progress Notes (Signed)
Writer spoke with patient 1:1 and he reports that he was told a lie by the doctor on today. He reports that he was told he could leave when his blood pressure came down. He refused to take any medications and will take his medication once he leaves here. Writer encouraged him to try and calm down so that he could talk to the doctor on tomorrow. Support given and safety maintained on unit with 15 min checks.
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
## 2016-05-15 NOTE — [facility] Suicide Risk Assessment (Signed)
[facility] Admission Suicide Risk Assessment   Nursing information obtained from:  Patient Demographic factors:  Male, Caucasian, Low socioeconomic status, Unemployed, Access to firearms Current Mental Status:  NA Loss Factors:  Decrease in vocational status, Financial problems / change in socioeconomic status Historical Factors:  Family history of mental illness or substance abuse, Impulsivity, Domestic violence Risk Reduction Factors:  Living with another person, especially a relative  Total Time spent with patient: 30 minutes Principal Problem: <principal problem not specified> Diagnosis:   Patient Active Problem List   Diagnosis Date Noted  . Alcohol use disorder, severe, dependence ([place]) [F10.20] 05/14/2016  . OSA (obstructive sleep apnea) [G47.33] 04/20/2016  . Excessive sleepiness [G47.10] 04/20/2016  . Convulsions ([place]) [R56.9] 03/09/2016  . Occipital headache [R51] 03/09/2016  . Memory loss [R41.3] 03/09/2016  . Costochondral chest pain [R07.1] 03/09/2016   Subjective Data: Patient denies any current suicidal or homicidal ideation, plan or intent. He is not currently interested in stopping drinking. He currently requests discharge.  Continued Clinical Symptoms:  Alcohol Use Disorder Identification Test Final Score (AUDIT): 28 The "Alcohol Use Disorders Identification Test", Guidelines for Use in Primary Care, Second Edition.  World Pharmacologist [facility]). Score between 0-7:  no or low risk or alcohol related problems. Score between 8-15:  moderate risk of alcohol related problems. Score between 16-19:  high risk of alcohol related problems. Score 20 or above:  warrants further diagnostic evaluation for alcohol dependence and treatment.   CLINICAL FACTORS:   Alcohol/Substance Abuse/Dependencies   Musculoskeletal: Strength & Muscle Tone: within normal limits Gait & Station: normal Patient leans: N/A  Psychiatric Specialty Exam: Physical Exam  ROS  Blood pressure (!)  154/113, pulse 77, temperature 98.6 F (37 C), temperature source Oral, resp. rate 18, height 5\' 9"  (1.753 m), weight 68.5 kg (151 lb), SpO2 100 %.Body mass index is 22.3 kg/m.  General Appearance: Casual  Eye Contact:  Good  Speech:  Mildly rapid  Volume:  Normal  Mood:  Anxious  Affect:  Congruent  Thought Process:  Coherent  Orientation:  Full (Time, Place, and Person)  Thought Content:  Negative  Suicidal Thoughts:  No  Homicidal Thoughts:  No  Memory:  Negative  Judgement:  Fair  Insight:  Fair  Psychomotor Activity:  Normal  Concentration:  Concentration: Good and Attention Span: Good  Recall:  Good  Fund of Knowledge:  Good  Language:  Good  Akathisia:  No  Handed:  Right  AIMS (if indicated):     Assets:  Resilience  ADL's:  Intact  Cognition:  WNL  Sleep:  Number of Hours: 6      COGNITIVE FEATURES THAT CONTRIBUTE TO RISK:  None    SUICIDE RISK:   Mild:  Suicidal ideation of limited frequency, intensity, duration, and specificity.  There are no identifiable plans, no associated intent, mild dysphoria and related symptoms, good self-control (both objective and subjective assessment), few other risk factors, and identifiable protective factors, including available and accessible social support.   PLAN OF CARE: The patient was admitted after becoming intoxicated and agitated over the weekend. Currently he denies any psychiatric component to his symptoms and he knowledges that he is a drinker however states he has no interest in quitting at this time. He spoke with the social worker and currently does not feel he has any need for any psychiatric follow-up. Currently he does not appear to be a danger to himself or others and exhibits no psychiatric disorder such that he would  have to be an inpatient and therefore he will be discharged per his request. He is encouraged to consider follow-up for the stress in his life however as this could be helpful for him.  I certify  that inpatient services furnished can reasonably be expected to improve the patient's condition.  Linard Millers, MD 05/15/2016, 9:28 AM
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
## 2016-05-15 NOTE — Progress Notes (Addendum)
Patient verbalizes readiness for discharge. Follow up plan explained, Rx's given, all belongings returned. Patient verbalizes understanding. Denies SI/HI and assures this Probation officer he will seek assistance should that change. Patient discharged ambulatory and in stable condition to wife.

## 2016-05-15 NOTE — Progress Notes (Signed)
D: Patient up and visible in the milieu. Restless and focused on discharge. Spoke with patient 1:1. Rates sleep good, appetite poor, energy normal and concentration good. Patient's affect animated, mood angry and labile. Rating his depression at a 0/10, hopelessness at a 0/10 and anxiety at a 0/10. States goal for today is to "to go home. Talk with doctor and tell him what he needs to hear." Denies pain, physical problems.   A: Attempted to mdicate per orders, no prns needed, requested. Emotional support offered and self inventory reviewed. Fall teaching completed and precautions in place.   R: Patient refusing all meds except avapro. He verbalizes understanding of POC.  Patient denies SI/HI and remains safe on level III obs. Signed to voluntary status.

## 2016-05-15 NOTE — BHH Suicide Risk Assessment (Signed)
[facility] Discharge Suicide Risk Assessment   Nursing information obtained from:  Patient Demographic factors:  Male, Caucasian, Low socioeconomic status, Unemployed, Access to firearms Current Mental Status:  NA Loss Factors:  Decrease in vocational status, Financial problems / change in socioeconomic status Historical Factors:  Family history of mental illness or substance abuse, Impulsivity, Domestic violence Risk Reduction Factors:  Living with another person, especially a relative  Total Time spent with patient: 30 minutes Principal Problem: <principal problem not specified> Diagnosis:   Patient Active Problem List   Diagnosis Date Noted  . Alcohol use disorder, severe, dependence ([place]) [F10.20] 05/14/2016  . OSA (obstructive sleep apnea) [G47.33] 04/20/2016  . Excessive sleepiness [G47.10] 04/20/2016  . Convulsions ([place]) [R56.9] 03/09/2016  . Occipital headache [R51] 03/09/2016  . Memory loss [R41.3] 03/09/2016  . Costochondral chest pain [R07.1] 03/09/2016   Subjective Data: Patient denies any current suicidal or homicidal ideation, plan or intent. He is not currently interested in stopping drinking. He currently requests discharge.  Continued Clinical Symptoms:  Alcohol Use Disorder Identification Test Final Score (AUDIT): 28 The "Alcohol Use Disorders Identification Test", Guidelines for Use in Primary Care, Second Edition.  World Pharmacologist [facility]). Score between 0-7:  no or low risk or alcohol related problems. Score between 8-15:  moderate risk of alcohol related problems. Score between 16-19:  high risk of alcohol related problems. Score 20 or above:  warrants further diagnostic evaluation for alcohol dependence and treatment.   CLINICAL FACTORS:   Alcohol/Substance Abuse/Dependencies   Musculoskeletal: Strength & Muscle Tone: within normal limits Gait & Station: normal Patient leans: N/A  Psychiatric Specialty Exam: Physical Exam  ROS  Blood pressure (!)  154/113, pulse 77, temperature 98.6 F (37 C), temperature source Oral, resp. rate 18, height 5\' 9"  (1.753 m), weight 68.5 kg (151 lb), SpO2 100 %.Body mass index is 22.3 kg/m.  General Appearance: Casual  Eye Contact:  Good  Speech:  Mildly rapid  Volume:  Normal  Mood:  Anxious  Affect:  Congruent  Thought Process:  Coherent  Orientation:  Full (Time, Place, and Person)  Thought Content:  Negative  Suicidal Thoughts:  No  Homicidal Thoughts:  No  Memory:  Negative  Judgement:  Fair  Insight:  Fair  Psychomotor Activity:  Normal  Concentration:  Concentration: Good and Attention Span: Good  Recall:  Good  Fund of Knowledge:  Good  Language:  Good  Akathisia:  No  Handed:  Right  AIMS (if indicated):     Assets:  Resilience  ADL's:  Intact  Cognition:  WNL  Sleep:  Number of Hours: 6      COGNITIVE FEATURES THAT CONTRIBUTE TO RISK:  None    SUICIDE RISK:   Mild:  Suicidal ideation of limited frequency, intensity, duration, and specificity.  There are no identifiable plans, no associated intent, mild dysphoria and related symptoms, good self-control (both objective and subjective assessment), few other risk factors, and identifiable protective factors, including available and accessible social support.   PLAN OF CARE: The patient was admitted after becoming intoxicated and agitated over the weekend. Currently he denies any psychiatric component to his symptoms and he knowledges that he is a drinker however states he has no interest in quitting at this time. He spoke with the social worker and currently does not feel he has any need for any psychiatric follow-up. Currently he does not appear to be a danger to himself or others and exhibits no psychiatric disorder such that he would  have to be an inpatient and therefore he will be discharged per his request. He is encouraged to consider follow-up for the stress in his life however as this could be helpful for him.  I certify  that inpatient services furnished can reasonably be expected to improve the patient's condition.  Linard Millers, MD 05/15/2016, 11:31 AM

## 2016-05-15 NOTE — Progress Notes (Signed)
  [facility] Adult Case Management Discharge Plan :  Will you be returning to the same living situation after discharge:  Yes,  return home At discharge, do you have transportation home?: Yes,  wife Do you have the ability to pay for your medications: Yes,  no concerns expressed  Release of information consent forms completed and in the chart;  Patient's signature needed at discharge.  Patient to Follow up at: Follow-up Information    None .   Why:  Patient declined consent for aftercare referrals at this time.            Next level of care provider has access to [place] and Suicide Prevention discussed: No.  CSW will contact wife w patient permission  Have you used any form of tobacco in the last 30 days? (Cigarettes, Smokeless Tobacco, Cigars, and/or Pipes): Yes  Has patient been referred to the Quitline?: Patient refused referral  Patient has been referred for addiction treatment: Pt. refused referral  Beverely Pace 05/15/2016, 11:14 AM

## 2016-05-15 NOTE — Discharge Summary (Signed)
Physician Discharge Summary Note  Patient:  [name] is an 55 y.o., male MRN:  [id number] DOB:  04-03-1961 Patient phone:  [phone] (home)  Patient address:   [id number] [place] [address],  Total Time spent with patient: 30 minutes  Date of Admission:  05/14/2016 Date of Discharge: 05/15/2016  Reason for Admission:  Substance abuse  Principal Problem: Alcohol use disorder, severe, dependence [facility]) Discharge Diagnoses: Patient Active Problem List   Diagnosis Date Noted  . Alcohol use disorder, severe, dependence ([place]) [F10.20] 05/14/2016    Priority: High  . OSA (obstructive sleep apnea) [G47.33] 04/20/2016  . Excessive sleepiness [G47.10] 04/20/2016  . Convulsions ([place]) [R56.9] 03/09/2016  . Occipital headache [R51] 03/09/2016  . Memory loss [R41.3] 03/09/2016  . Costochondral chest pain [R07.1] 03/09/2016    Past Psychiatric History: see HPI  Past Medical History:  Past Medical History:  Diagnosis Date  . Anxiety   . Anxiety and depression   . Depression   . Hemorrhoids   . Hypertension   . Hypothyroidism   . Seizures ([place]) 02/2016    Past Surgical History:  Procedure Laterality Date  . BICEPS TENDON REPAIR Right   . CARPAL TUNNEL RELEASE Bilateral   . KNEE CARTILAGE SURGERY Left   . KNEE SURGERY Right    x 2   Family History:  Family History  Problem Relation Age of Onset  . Hypertension Father   . Kidney disease Mother   . Breast cancer Paternal Grandmother   . Kidney failure Maternal Grandmother   . Multiple sclerosis Sister   . Heart attack Brother     x 3  . Heart defect Maternal Uncle    Family Psychiatric  History: see HPI Social History:  History  Alcohol Use  . 13.2 oz/week  . 14 Standard drinks or equivalent, 8 Shots of liquor per week    Comment: 8 mixed drinks twice weekly     History  Drug Use  . Types: Marijuana    Comment: last use thc 2 months ago    Social History   Social History  . Marital status: Married    Spouse name: [name]  . Number of children: 1  . Years of education: 20   Occupational History  . construction     remodels houses   Social History Main Topics  . Smoking status: Current Every Day Smoker    Packs/day: 2.00    Years: 20.00    Types: Cigarettes  . Smokeless tobacco: Never Used  . Alcohol use 13.2 oz/week    14 Standard drinks or equivalent, 8 Shots of liquor per week     Comment: 8 mixed drinks twice weekly  . Drug use:     Types: Marijuana     Comment: last use thc 2 months ago  . Sexual activity: Not Currently    Birth control/ protection: None   Other Topics Concern  . None   Social History Narrative   Lives with wife   Caffeine- coffee 2 cups daily    Hospital Course:  Javaughn Gerren, 54 yo came in intoxicated.  He stated stressors from family and unemployment issues.  He denied any suicidal and homicidal ideations.  He also stated that he was not interested in quitting and had the ability to stop when he chose to quit.  He stated a possible job and was quite motivated to be discharged as soon as possible.   Can E Quinto was admitted for Alcohol use  disorder, severe, dependence ([place]) and crisis management.  Patient was treated with medications with their indications listed below in detail under Medication List.  Medical problems were identified and treated as needed.  Home medications were restarted as appropriate.  Improvement was monitored by observation and Caetano E Eakle daily report of symptom reduction.  Emotional and mental status was monitored by daily self inventory reports completed by [name] and clinical staff.  Patient reported continued improvement, denied any new concerns.  Patient had been compliant on medications and denied side effects.  Support and encouragement was provided.    Patient encouraged to attend groups to help with recognizing triggers of emotional crises and de-stabilizations.  Patient encouraged to attend group to  help identify the positive things in life that would help in dealing with feelings of loss, depression and unhealthy or abusive tendencies.         Nathanyl E Leese was evaluated by the treatment team for stability and plans for continued recovery upon discharge.  Patient was offered further treatment options upon discharge including Residential, Intensive Outpatient and Outpatient treatment. Patient will follow up with agency listed below for medication management and counseling.  Encouraged patient to maintain satisfactory support network and home environment.  Advised to adhere to medication compliance and outpatient treatment follow up.  Prescriptions provided.       Trigger E Kawa motivation was an integral factor for scheduling further treatment.  Employment, transportation, bed availability, health status, family support, and any pending legal issues were also considered during patient's hospital stay.  Upon completion of this admission the patient was both mentally and medically stable for discharge denying suicidal/homicidal ideation, auditory/visual/tactile hallucinations, delusional thoughts and paranoia.      Physical Findings: AIMS: Facial and Oral Movements Muscles of Facial Expression: None, normal Lips and Perioral Area: None, normal Jaw: None, normal Tongue: None, normal,Extremity Movements Upper (arms, wrists, hands, fingers): None, normal Lower (legs, knees, ankles, toes): None, normal, Trunk Movements Neck, shoulders, hips: None, normal, Overall Severity Severity of abnormal movements (highest score from questions above): None, normal Incapacitation due to abnormal movements: None, normal Patient's awareness of abnormal movements (rate only patient's report): No Awareness, Dental Status Current problems with teeth and/or dentures?: No Does patient usually wear dentures?: No  CIWA:  CIWA-Ar Total: 3 COWS:  COWS Total Score: 9  Musculoskeletal: Strength & Muscle Tone:  within normal limits Gait & Station: normal Patient leans: N/A  Psychiatric Specialty Exam: Physical Exam  Nursing note and vitals reviewed. Psychiatric: He has a normal mood and affect. His speech is normal and behavior is normal. Judgment and thought content normal. Thought content is not paranoid. Cognition and memory are normal.    Review of Systems  Constitutional: Negative.  Negative for fever.  Eyes: Negative.   Respiratory: Negative.   Cardiovascular: Negative.  Negative for chest pain.  Gastrointestinal: Negative.   Genitourinary: Negative.   Musculoskeletal: Negative.   Skin: Negative.   Neurological: Negative for headaches.  Endo/Heme/Allergies: Negative.   Psychiatric/Behavioral: Negative.     Blood pressure (!) 154/113, pulse 77, temperature 98.6 F (37 C), temperature source Oral, resp. rate 18, height 5\' 9"  (1.753 m), weight 68.5 kg (151 lb), SpO2 100 %.Body mass index is 22.3 kg/m.    Have you used any form of tobacco in the last 30 days? (Cigarettes, Smokeless Tobacco, Cigars, and/or Pipes): Yes  Has this patient used any form of tobacco in the last 30 days? (Cigarettes, Smokeless Tobacco, Cigars, and/or  Pipes) Yes, N/A  Blood Alcohol level:  Lab Results  Component Value Date   ETH 259 (H) [id number]    Metabolic Disorder Labs:  No results found for: HGBA1C, MPG No results found for: PROLACTIN No results found for: CHOL, TRIG, HDL, CHOLHDL, VLDL, LDLCALC  See Psychiatric Specialty Exam and Suicide Risk Assessment completed by Attending Physician prior to discharge.  Discharge destination:  Home  Is patient on multiple antipsychotic therapies at discharge:  No   Has Patient had three or more failed trials of antipsychotic monotherapy by history:  No  Recommended Plan for Multiple Antipsychotic Therapies: NA     Medication List    STOP taking these medications   ALPRAZolam 0.5 MG tablet Commonly known as:  XANAX   fluticasone 50 MCG/ACT nasal  spray Commonly known as:  FLONASE   meloxicam 15 MG tablet Commonly known as:  MOBIC   olmesartan 40 MG tablet Commonly known as:  BENICAR   sertraline 100 MG tablet Commonly known as:  ZOLOFT   ZZZQUIL 25 MG Caps Generic drug:  DiphenhydrAMINE HCl (Sleep)     TAKE these medications     Indication  hydrOXYzine 25 MG tablet Commonly known as:  ATARAX/VISTARIL Take 1 tablet (25 mg total) by mouth every 6 (six) hours as needed for anxiety (or CIWA score </= 10).  Indication:  Anxiety Neurosis   levothyroxine 150 MCG tablet Commonly known as:  SYNTHROID, LEVOTHROID Take 1 tablet (150 mcg total) by mouth daily before breakfast. Start taking on:  05/16/2016  Indication:  Underactive Thyroid   nicotine 21 mg/24hr patch Commonly known as:  NICODERM CQ - dosed in mg/24 hours Place 1 patch (21 mg total) onto the skin daily. Start taking on:  05/16/2016  Indication:  Nicotine Addiction   traZODone 50 MG tablet Commonly known as:  DESYREL Take 1 tablet (50 mg total) by mouth at bedtime as needed for sleep.  Indication:  Trouble Sleeping      Follow-up Information    None .   Why:  Patient declined consent for aftercare referrals at this time.            Follow-up recommendations:  Activity:  as tol Diet:  as tol  Comments:  1.  Take all your medications as prescribed.   2.  Report any adverse side effects to outpatient provider. 3.  Patient instructed to not use alcohol or illegal drugs while on prescription medicines. 4.  In the event of worsening symptoms, instructed patient to call 911, the crisis hotline or go to nearest emergency room for evaluation of symptoms.  Signed: Janett Labella, NP [facility] 05/15/2016, 1:21 PM

## 2016-05-15 NOTE — BHH Suicide Risk Assessment (Signed)
[place] INPATIENT:  Family/Significant Other Suicide Prevention Education  Suicide Prevention Education:  Education Completed; Sheila Pavlicek, wife, [phone] (cell),  (name of family member/significant other) has been identified by the patient as the family member/significant other with whom the patient will be residing, and identified as the person(s) who will aid the patient in the event of a mental health crisis (suicidal ideations/suicide attempt).  With written consent from the patient, the family member/significant other has been provided the following suicide prevention education, prior to the and/or following the discharge of the patient.  The suicide prevention education provided includes the following:  Suicide risk factors  Suicide prevention and interventions  National Suicide Hotline telephone number  [facility] assessment telephone number  [facility] Emergency Assistance [place] and/or Residential Mobile Crisis Unit telephone number  Request made of family/significant other to:  Remove weapons (e.g., guns, rifles, knives), all items previously/currently identified as safety concern.    Remove drugs/medications (over-the-counter, prescriptions, illicit drugs), all items previously/currently identified as a safety concern.  The family member/significant other verbalizes understanding of the suicide prevention education information provided.  The family member/significant other agrees to remove the items of safety concern listed above.  Wife called, very concerned about patient's mental state.  Says that pt was sober for 17 years, returned to alcohol use approx one year ago.  "he is a totally different person when drinking." "He drives when he's drinking, he is going to hurt somebody and he doesn't care, he will leave in the middle of the night when Im asleep." Family has "searched the streets" because nobody knew where he was.  Wife had  guns in the home, is having nephew remove prior to patient return home.  Wife also concerned pt has medications which he has used to overdose in the past.  "He overtakes his Xanax because he told me he wanted to die."  "nothing helps, if he wants to drink he will drink no matter what."  Xanax and other medications are prescribed by Leonia Reader at [facility], wife used to lock up medications and will do this going forward.  "I cant watch him 24/7, I have to get a job."    Wife states that she is looking for a job for patient, states he "lays on the couch and hides under a blanket because he doesn't want to deal w life."  Wife informed that patient has declined aftercare, "that was the whole problem, he wouldn't go.  That's why he ended up where he is.  He pulled a gun out and was going to shoot himself."  Wife took away gun in suicide gesture prior to admission.  "I don't think he is ready to come home.  He wont accept any help, wont even admit he has a problem."  Low mood is "all the time" per wife.  More actively suicidal when drinking and/or abusing Xanax.    Wife has told patient he could not live at home and drink - "he said he didn't care and would just go into the woods and drink or die on the street."  17 years ago patient quit drinking because "the woman he lived w told him he had to."  Was not drinking when married wife approx 7 years - pt began drinking one year into their marriage. "Hes an alcoholic, he thinks he can control it and drink socially." Wife advised to attend [employer] to strengthen her response to patient.  Wife aware  that patient is "not doing anything" on the unit, not going to meetings, not taking medications, not eating.    Beverely Pace 05/15/2016, 12:22 PM

## 2016-05-16 NOTE — BHH Group Notes (Signed)
Patient did not attend group due to being discharged.  Lane Hacker, MSW Clinical Social Work: [employer]

## 2016-05-24 ENCOUNTER — Ambulatory Visit (INDEPENDENT_AMBULATORY_CARE_PROVIDER_SITE_OTHER): Payer: BLUE CROSS/BLUE SHIELD | Admitting: Neurology

## 2016-05-24 DIAGNOSIS — G4710 Hypersomnia, unspecified: Secondary | ICD-10-CM

## 2016-05-24 DIAGNOSIS — G4733 Obstructive sleep apnea (adult) (pediatric): Secondary | ICD-10-CM | POA: Diagnosis not present

## 2016-05-29 ENCOUNTER — Other Ambulatory Visit (HOSPITAL_COMMUNITY): Payer: Self-pay | Admitting: Psychiatry

## 2016-05-31 ENCOUNTER — Telehealth: Payer: Self-pay | Admitting: *Deleted

## 2016-05-31 DIAGNOSIS — G4733 Obstructive sleep apnea (adult) (pediatric): Secondary | ICD-10-CM

## 2016-05-31 NOTE — Telephone Encounter (Signed)
LMOM for pt. to call.  I have sleep study results for him/fim

## 2016-06-07 NOTE — Telephone Encounter (Signed)
Pt returned call. Please call [phone]

## 2016-06-07 NOTE — Addendum Note (Signed)
Addended by: France Ravens I on: 06/07/2016 02:37 PM   Modules accepted: Orders

## 2016-06-07 NOTE — Telephone Encounter (Signed)
I have spoken with [place] this afternoon and explained that, based on S/N study, RAS rec. CPAP, pressure of 7 for OSA.  He verbalized understanding of same, is agreeable. CPAP orders, ov note, sleep study, demo and ins. info faxed to [place] fax# [phone].  F/U for ins. mandated compliance visit scheduled as well/fim

## 2016-06-08 ENCOUNTER — Ambulatory Visit (HOSPITAL_COMMUNITY): Payer: Self-pay | Admitting: Psychiatry

## 2016-06-19 NOTE — Progress Notes (Signed)
Psychiatric Initial Adult Assessment   Patient Identification: [name] MRN:  [id number] Date of Evaluation:  06/20/2016 Referral Source: [place], [place] Chief Complaint:   Chief Complaint    New Evaluation; Anxiety    "Eh..They told me I have to see a psychiatrist" Visit Diagnosis:    ICD-9-CM ICD-10-CM   1. Anxiety state 300.00 F41.1   2. Alcohol use disorder, moderate, in early remission ([place]) 305.03 F10.21     History of Present Illness:   [name] is a 55 year old male with alcohol use disorder with recent admission to [facility] on 10/9-10, originally brought to ED by sheriff for agitation in the setting of alcohol use.   He states that he is here as he was advised by the primary care physician's to come here to continue his medication. He does not know which medication they are referring. He initially denies seeing a psychiatrist or any psychiatry admission. When he is asked about his recent admission to [facility], he admits he was there for "anxiety." He does not remember how he acted that time. He does not think alcohol is a problem for him. Although he initially states he drinks a couple of mixed drink per day, he later states that he quits drinking as his wife told him to. He goes to [employer] meeting every week and states it is "okay." He is not interested in getting a sponsor, as he does not think he will go back to drinking. He used to drink 30 years (drinks a pint of "basic alcohol" every two days), becoming bored. He denies any issues at work, stating that his job was a Museum/gallery exhibitions officer. The longest sobriety was for 12 years when he was with a girlfriend. He denies rehab treatment. He feels anxious as he is trying to get employed. He had a call back from a job he is applying for a folk [employer]. He also reports that his wife with ileostomy needs to get her job back. He denies panic attack.   He denies insomnia. He has started to use a CPAP machine. He takes a walk with his dog. He  reports poor appetite and eats only once a day. He denies SI, HI, Ah/VH. He denies trauma. He denies decreased need for sleep. He denies drug use. He smokes 1.5 PPD.   Associated Signs/Symptoms: Depression Symptoms:  fatigue, anxiety, (Hypo) Manic Symptoms:  denies Anxiety Symptoms:  mild anxiety Psychotic Symptoms:  denies PTSD Symptoms: Negative  Past Psychiatric History:  Outpatient: denies Psychiatry admission: [facility] in 10/9-10/05/2016 for agitation in the setting of alcohol use Previous suicide attempt: denies  Past trials of medication: Zoloft, Xanax Substance use: started drinking at age 51 with his father who abuses alcohol. Longest sobriety for 12 years without formal treatment. He goes to [employer] weekly.  Previous Psychotropic Medications: No   Substance Abuse History in the last 12 months:  Yes.    Consequences of Substance Abuse: admission to [facility] for agitation  Past Medical History:  Past Medical History:  Diagnosis Date  . Anxiety   . Anxiety and depression   . Depression   . Hemorrhoids   . Hypertension   . Hypothyroidism   . Seizures ([employer]) 02/2016    Past Surgical History:  Procedure Laterality Date  . BICEPS TENDON REPAIR Right   . CARPAL TUNNEL RELEASE Bilateral   . KNEE CARTILAGE SURGERY Left   . KNEE SURGERY Right    x 2    Family Psychiatric  History:  Father- alcohol use, denies suicide attempt  Family History:  Family History  Problem Relation Age of Onset  . Hypertension Father   . Kidney disease Mother   . Breast cancer Paternal Grandmother   . Kidney failure Maternal Grandmother   . Multiple sclerosis Sister   . Heart attack Brother     x 3  . Heart defect Maternal Uncle     Social History:   Social History   Social History  . Marital status: Married    Spouse name: [name]  . Number of children: 1  . Years of education: 2   Occupational History  . construction     remodels houses   Social History Main Topics  .  Smoking status: Current Every Day Smoker    Packs/day: 2.00    Years: 20.00    Types: Cigarettes  . Smokeless tobacco: Never Used  . Alcohol use 13.2 oz/week    8 Shots of liquor, 14 Standard drinks or equivalent per week     Comment: 8 mixed drinks twice weekly, 06-20-2016 per pt no but stooped 3 months ago  . Drug use:     Types: Marijuana     Comment: last use thc 2 months ago, 06-20-2016 per pt Marijuana   . Sexual activity: Not Currently    Birth control/ protection: None   Other Topics Concern  . None   Social History Narrative   Lives with wife   Caffeine- coffee 2 cups daily    Additional Social History:  Used to work for remodeling houses, two years,  Education: 12 th grade Married, Lives with his wife of 6 years, 2 children From [place], mother lives in [place], three siblings Patient has an access to gun, safely secured Legal issues: denies  Allergies:  No Known Allergies  Metabolic Disorder Labs: No results found for: HGBA1C, MPG No results found for: PROLACTIN No results found for: CHOL, TRIG, HDL, CHOLHDL, VLDL, LDLCALC   Current Medications: Current Outpatient Prescriptions  Medication Sig Dispense Refill  . hydrOXYzine (ATARAX/VISTARIL) 25 MG tablet Take 1 tablet (25 mg total) by mouth every 8 (eight) hours as needed for anxiety. 30 tablet 0  . levothyroxine (SYNTHROID, LEVOTHROID) 150 MCG tablet Take 1 tablet (150 mcg total) by mouth daily before breakfast. 30 tablet 0  . meloxicam (MOBIC) 15 MG tablet Take 15 mg by mouth daily.    . nicotine (NICODERM CQ - DOSED IN MG/24 HOURS) 21 mg/24hr patch Place 1 patch (21 mg total) onto the skin daily. 28 patch 0  . olmesartan (BENICAR) 40 MG tablet Take 40 mg by mouth daily.    . sertraline (ZOLOFT) 100 MG tablet Take 1 tablet (100 mg total) by mouth 2 (two) times daily. 60 tablet 0  . traZODone (DESYREL) 50 MG tablet Take 1 tablet (50 mg total) by mouth at bedtime as needed for sleep. 30 tablet 0   No  current facility-administered medications for this visit.    Facility-Administered Medications Ordered in Other Visits  Medication Dose Route Frequency Provider Last Rate Last Dose  . gadopentetate dimeglumine (MAGNEVIST) injection 15 mL  15 mL Intravenous Once PRN Britt Bottom, MD        Neurologic: Headache: No Seizure: No Paresthesias:No  Musculoskeletal: Strength & Muscle Tone: within normal limits Gait & Station: normal Patient leans: N/A  Psychiatric Specialty Exam: Review of Systems  Psychiatric/Behavioral: Negative for depression, hallucinations, memory loss, substance abuse and suicidal ideas. The patient is nervous/anxious. The  patient does not have insomnia.   All other systems reviewed and are negative.   Blood pressure (!) 169/93, pulse (!) 59, height 5' 8.5" (1.74 m), weight 167 lb (75.8 kg).Body mass index is 25.02 kg/m.  General Appearance: Fairly Groomed  Eye Contact:  Minimal  Speech:  Clear and Coherent  Volume:  Normal  Mood:  "okay"  Affect:  Restricted  Thought Process:  Coherent, inconsistent and vague at times  Orientation:  Full (Time, Place, and Person)  Thought Content:  denies paranoia Perceptions: denies AH/VH  Suicidal Thoughts:  No  Homicidal Thoughts:  No  Memory:  Immediate;   Good Recent;   Good Remote;   Good  Judgement:  Fair  Insight:  Shallow  Psychomotor Activity:  Normal  Concentration:  Concentration: Good and Attention Span: Good  Recall:  Good  Fund of Knowledge:Good  Language: Good  Akathisia:  No  Handed:  Right  AIMS (if indicated):  N/A  Assets:  Communication Skills Desire for Improvement  ADL's:  Intact  Cognition: WNL  Sleep:  good   Assessment [name] is a 55 year old male with alcohol use disorder, hypothyroidism, hypertension. He has recent admission to [facility] on 10/9-05/2016, originally brought to ED by sheriff for agitation in the setting of alcohol use.   # Adjustment disorder with anxiety # r/o  GAD # r/o substance induced anxiety disorder He report anxiety in the setting of unemployment without significant impact on his function. Will continue the current dose. Noted that he is at maximum dose of sertraline; may consider down titrate at the next visit if he denies significant mood symptoms. Of note, patient denies taking Xanax; will not plan to prescribe any benzos as he is at higher risk of dependence given his history of alcohol use.  # Alcohol use disorder in early remission Although patient provides inconsistent history at times and has limited insight into his alcohol use, he states that he has been in sobriety since 05/2016 and goes to [place] meeting weekly. Although he would be a good candidate for naltrexone or gabapentin, he declines this option at this time. Will continue motivational interview.  Plan 1. Continue sertraline 200 mg daily 2. Continue hydroxyzine 25 mg three times a day as needed for anxiety 3. Continue Trazodone 50 mg at night as needed for sleep 4. Continue going to [place] meeting 5. Return to clinic in one month  The patient demonstrates the following risk factors for suicide: Chronic risk factors for suicide include: psychiatric disorder of anxiety and substance use disorder. Acute risk factors for suicide include: unemployment and recent discharge from inpatient psychiatry. Protective factors for this patient include: hope for the future. Considering these factors, the overall suicide risk at this point appears to be low. Patient is appropriate for outpatient follow up. Patient does have a gun access which is safely secured.  Treatment Plan Summary: Plan as above   Norman Clay, MD 11/14/201711:43 AM

## 2016-06-20 ENCOUNTER — Encounter (HOSPITAL_COMMUNITY): Payer: Self-pay | Admitting: Psychiatry

## 2016-06-20 ENCOUNTER — Ambulatory Visit (INDEPENDENT_AMBULATORY_CARE_PROVIDER_SITE_OTHER): Payer: BLUE CROSS/BLUE SHIELD | Admitting: Psychiatry

## 2016-06-20 VITALS — BP 169/93 | HR 59 | Ht 68.5 in | Wt 167.0 lb

## 2016-06-20 DIAGNOSIS — F1021 Alcohol dependence, in remission: Secondary | ICD-10-CM | POA: Diagnosis not present

## 2016-06-20 DIAGNOSIS — F17210 Nicotine dependence, cigarettes, uncomplicated: Secondary | ICD-10-CM

## 2016-06-20 DIAGNOSIS — Z8249 Family history of ischemic heart disease and other diseases of the circulatory system: Secondary | ICD-10-CM | POA: Diagnosis not present

## 2016-06-20 DIAGNOSIS — F411 Generalized anxiety disorder: Secondary | ICD-10-CM | POA: Diagnosis not present

## 2016-06-20 DIAGNOSIS — Z803 Family history of malignant neoplasm of breast: Secondary | ICD-10-CM

## 2016-06-20 DIAGNOSIS — Z841 Family history of disorders of kidney and ureter: Secondary | ICD-10-CM | POA: Diagnosis not present

## 2016-06-20 DIAGNOSIS — Z79899 Other long term (current) drug therapy: Secondary | ICD-10-CM

## 2016-06-20 MED ORDER — TRAZODONE HCL 50 MG PO TABS: 50.0000 mg | ORAL_TABLET | Freq: Every evening | ORAL | 0 refills | Status: DC | PRN

## 2016-06-20 MED ORDER — SERTRALINE HCL 100 MG PO TABS: 100.0000 mg | ORAL_TABLET | Freq: Two times a day (BID) | ORAL | 0 refills | Status: DC

## 2016-06-20 MED ORDER — HYDROXYZINE HCL 25 MG PO TABS: 25.0000 mg | ORAL_TABLET | Freq: Three times a day (TID) | ORAL | 0 refills | Status: DC | PRN

## 2016-06-20 NOTE — Patient Instructions (Signed)
1. Continue sertraline 200 mg daily 2. Continue hydroxyzine 25 mg three times a day as needed for anxiety 3. Continue Trazodone 50 mg at night as needed for sleep 4. Continue going to [place] meeting 5. Return to clinic in one month

## 2016-07-11 ENCOUNTER — Other Ambulatory Visit: Payer: Self-pay | Admitting: Neurology

## 2016-07-18 NOTE — Progress Notes (Signed)
[facility] MD/PA/NP OP Progress Note  07/20/2016 11:27 AM [name]  MRN:  [id number]  Chief Complaint:  Chief Complaint    Addiction Problem; Follow-up     Subjective:  "Eh.. It's hard" HPI:  Patient states that it has been hard for him. He has an upcoming court case in 12/21 and he will find whether he needs a supervision probation or spending in jail for a week due to DUI. He feels frustrated about bills and unemployment. He talks about memory loss and feels frustrated that he cannot remember things as he used to. He ha anger outburst and punches wall at times. He denies alcohol use for the past two months. He goes to [employer] every week with his wife. Although he states that he does not see the meaning in this meeting, later he states that he prefers it to [name], as people there seem to care about others.  He endorses insomnia and will have his CPAP machine checked.   Patient wife presents to the interview.  She states that he had a seizure couple of times; he loses consciousness and had a tongue biting. Although he was evaluated by neurology (CT and MRI per report), they have not found the cause of his seizure. She is wondering whether this is secondary to his anxiety. He tends to get stressed more during Thanksgiving and christmas; he appears to be stressed more due to his court issues, his wife's surgery and financial strain. There were times he sleeps in the couch doing nothing. Patient has not taken sertraline after he was discharged.   Visit Diagnosis:    ICD-9-CM ICD-10-CM   1. Alcohol use disorder, moderate, in early remission ([name]) 305.03 F10.21   2. Adjustment disorder with mixed anxiety and depressed mood 309.28 F43.23     Past Psychiatric History:  Outpatient: denies Psychiatry admission: [facility] in 10/9-10/05/2016 for agitation in the setting of alcohol use Previous suicide attempt: denies  Past trials of medication: Zoloft, Xanax Substance use: started drinking at  age 78 with his father who abuses alcohol. Longest sobriety for 12 years without formal treatment. He goes to [name] weekly.  Past Medical History:  Past Medical History:  Diagnosis Date  . Anxiety   . Anxiety and depression   . Depression   . Hemorrhoids   . Hypertension   . Hypothyroidism   . Seizures ([place]) 02/2016    Past Surgical History:  Procedure Laterality Date  . BICEPS TENDON REPAIR Right   . CARPAL TUNNEL RELEASE Bilateral   . KNEE CARTILAGE SURGERY Left   . KNEE SURGERY Right    x 2    Family Psychiatric History:  Father- alcohol use, denies suicide attempt  Family History:  Family History  Problem Relation Age of Onset  . Hypertension Father   . Kidney disease Mother   . Breast cancer Paternal Grandmother   . Kidney failure Maternal Grandmother   . Multiple sclerosis Sister   . Heart attack Brother     x 3  . Heart defect Maternal Uncle     Social History:  Social History   Social History  . Marital status: Married    Spouse name: [name]  . Number of children: 1  . Years of education: 75   Occupational History  . construction     remodels houses   Social History Main Topics  . Smoking status: Current Every Day Smoker    Packs/day: 2.00    Years: 20.00  Types: Cigarettes  . Smokeless tobacco: Never Used  . Alcohol use 13.2 oz/week    8 Shots of liquor, 14 Standard drinks or equivalent per week     Comment: 8 mixed drinks twice weekly, 06-20-2016 per pt no but stooped 3 months ago  . Drug use:     Types: Marijuana     Comment: last use thc 2 months ago, 06-20-2016 per pt Marijuana   . Sexual activity: Not Currently    Birth control/ protection: None   Other Topics Concern  . None   Social History Narrative   Lives with wife   Caffeine- coffee 2 cups daily    Allergies: No Known Allergies  Metabolic Disorder Labs: No results found for: HGBA1C, MPG No results found for: PROLACTIN No results found for: CHOL, TRIG, HDL,  CHOLHDL, VLDL, LDLCALC   Current Medications: Current Outpatient Prescriptions  Medication Sig Dispense Refill  . hydrOXYzine (ATARAX/VISTARIL) 25 MG tablet Take 1 tablet (25 mg total) by mouth daily as needed for anxiety. 30 tablet 1  . levothyroxine (SYNTHROID, LEVOTHROID) 150 MCG tablet Take 1 tablet (150 mcg total) by mouth daily before breakfast. 30 tablet 0  . olmesartan (BENICAR) 40 MG tablet Take 40 mg by mouth daily.    . traZODone (DESYREL) 100 MG tablet Take 1 tablet (100 mg total) by mouth at bedtime as needed for sleep. 30 tablet 1  . meloxicam (MOBIC) 15 MG tablet TAKE 1 TABLET(15 MG) BY MOUTH DAILY 30 tablet 0  . sertraline (ZOLOFT) 50 MG tablet Take 25 mg daily for two weeks, then 50 mg daily 30 tablet 1   No current facility-administered medications for this visit.    Facility-Administered Medications Ordered in Other Visits  Medication Dose Route Frequency Provider Last Rate Last Dose  . gadopentetate dimeglumine (MAGNEVIST) injection 15 mL  15 mL Intravenous Once PRN Britt Bottom, MD        Neurologic: Headache: No Seizure: No Paresthesias: No  Musculoskeletal: Strength & Muscle Tone: within normal limits Gait & Station: normal Patient leans: N/A  Psychiatric Specialty Exam: Review of Systems  Neurological: Positive for seizures and headaches. Negative for dizziness and tremors.  Psychiatric/Behavioral: Positive for depression and suicidal ideas. Negative for hallucinations and substance abuse. The patient is nervous/anxious and has insomnia.   All other systems reviewed and are negative.   Blood pressure (!) 188/107, pulse 68, height 5' 8.5" (1.74 m), weight 164 lb 9.6 oz (74.7 kg).Body mass index is 24.66 kg/m.  General Appearance: Casual  Eye Contact:  Good  Speech:  Clear and Coherent  Volume:  Normal  Mood:  "fine"  Affect:  Restricted  Thought Process:  Coherent and Goal Directed  Orientation:  Full (Time, Place, and Person)  Thought Content:  Logical   Suicidal Thoughts:  Yes.  without intent/plan  Homicidal Thoughts:  No  Memory:  Immediate;   Good Recent;   Good Remote;   Good  Judgement:  Good  Insight:  Good  Psychomotor Activity:  Normal  Concentration:  Concentration: Good and Attention Span: Good  Recall:  Good  Fund of Knowledge: Good  Language: Good  Akathisia:  NA  Handed:  Right  AIMS (if indicated):  N/A  Assets:  Communication Skills Desire for Improvement  ADL's:  Intact  Cognition: WNL  Sleep:  poor   Assessment [name] is a 55 year old male with alcohol use disorder, hypothyroidism, hypertension. He has recent admission to [facility] on 10/9-05/2016, originally brought  to ED by sheriff for agitation in the setting of alcohol use.   # Adjustment disorder with anxiety and depressed mood # r/o GAD # r/o MDD Patient endorses anxiety in the setting of upcoming court and financial strain. He has not taken sertraline since discharged; will restart this medication to target his mood. Noted that patient has had a couple of seizure like episode, which he is currently under evaluation by neurology. Will continue to monitor. Will have prn hydroxyzine for anxiety. Will increase Trazodone to target insomnia.   # Alcohol use disorder in early remission Both patient and wife states that he has been abstinent from alcohol for the past two months. Patient goes to [facility] meeting every week with his wife; will continue motivational interview. Although he would be a good candidate for naltrexone or gabapentin, he declines this option. Continue to discuss as needed.   Noted that patient has high BP on today's evaluation. Patient is advised to measure home BP and is instructed to see his PCP if SBP remains >180. Patient reports chronic headache but denies other physical symptoms.   Plan 1. Start sertraline 25 mg daily for two weeks, then 50 mg daily 2. Continue hydroxyzine 25 mg daily as needed for anxiety 3. Increase  Trazodone 100 mg at night as needed for sleep 4. Continue going to [place] or AA meeting 5. Return to clinic in January  The patient demonstrates the following risk factors for suicide: Chronic risk factors for suicide include: psychiatric disorder of anxiety and substance use disorder. Acute risk factors for suicide include: unemployment and recent discharge from inpatient psychiatry. Protective factors for this patient include: hope for the future. Considering these factors, the overall suicide risk at this point appears to be low. Patient is appropriate for outpatient follow up. Patient does have a gun access which is safely secured.  Treatment Plan Summary: Plan as above  Norman Clay, MD 07/20/2016, 11:27 AM

## 2016-07-20 ENCOUNTER — Ambulatory Visit (INDEPENDENT_AMBULATORY_CARE_PROVIDER_SITE_OTHER): Payer: BLUE CROSS/BLUE SHIELD | Admitting: Psychiatry

## 2016-07-20 ENCOUNTER — Ambulatory Visit: Payer: BLUE CROSS/BLUE SHIELD | Admitting: Neurology

## 2016-07-20 ENCOUNTER — Encounter (HOSPITAL_COMMUNITY): Payer: Self-pay | Admitting: Psychiatry

## 2016-07-20 VITALS — BP 188/107 | HR 68 | Ht 68.5 in | Wt 164.6 lb

## 2016-07-20 DIAGNOSIS — F1021 Alcohol dependence, in remission: Secondary | ICD-10-CM | POA: Diagnosis not present

## 2016-07-20 DIAGNOSIS — Z79899 Other long term (current) drug therapy: Secondary | ICD-10-CM

## 2016-07-20 DIAGNOSIS — F4323 Adjustment disorder with mixed anxiety and depressed mood: Secondary | ICD-10-CM | POA: Diagnosis not present

## 2016-07-20 DIAGNOSIS — Z803 Family history of malignant neoplasm of breast: Secondary | ICD-10-CM

## 2016-07-20 DIAGNOSIS — R45851 Suicidal ideations: Secondary | ICD-10-CM

## 2016-07-20 DIAGNOSIS — Z841 Family history of disorders of kidney and ureter: Secondary | ICD-10-CM

## 2016-07-20 DIAGNOSIS — F17210 Nicotine dependence, cigarettes, uncomplicated: Secondary | ICD-10-CM

## 2016-07-20 DIAGNOSIS — Z8249 Family history of ischemic heart disease and other diseases of the circulatory system: Secondary | ICD-10-CM

## 2016-07-20 MED ORDER — SERTRALINE HCL 50 MG PO TABS: ORAL_TABLET | ORAL | 1 refills | Status: DC

## 2016-07-20 MED ORDER — HYDROXYZINE HCL 25 MG PO TABS: 25.0000 mg | ORAL_TABLET | Freq: Every day | ORAL | 1 refills | Status: DC | PRN

## 2016-07-20 MED ORDER — TRAZODONE HCL 100 MG PO TABS: 100.0000 mg | ORAL_TABLET | Freq: Every evening | ORAL | 1 refills | Status: DC | PRN

## 2016-07-20 NOTE — Patient Instructions (Addendum)
1. Start sertraline 25 mg daily for two weeks, then 50 mg daily 2. Continue hydroxyzine 25 mg daily as needed for anxiety 3. Increase Trazodone 100 mg at night as needed for sleep 4. Continue going to [place] or AA meeting 5. Return to clinic in January

## 2016-07-26 ENCOUNTER — Telehealth (HOSPITAL_COMMUNITY): Payer: Self-pay | Admitting: *Deleted

## 2016-07-26 NOTE — Telephone Encounter (Signed)
Called pt pharmacy to see if all of providers medications were received due to pt previous phone call. Per pt pharmacy pt picked up all of his medication on Dec 14th.

## 2016-07-26 NOTE — Telephone Encounter (Signed)
voice message from patient's wife, said pharmacy did not receive instructions for medication from his last visit.

## 2016-07-26 NOTE — Telephone Encounter (Signed)
Spoke with pt and he stated all is token care of

## 2016-07-26 NOTE — Telephone Encounter (Signed)
lmtcb

## 2016-08-05 IMAGING — CR DG ORBITS FOR FOREIGN BODY
2 series · 2 of 2 positions shown · non-contrast
Comparison: None.

CLINICAL DATA: Pre-MRI foreign body screening; no hx of metal work
or welding, however, patient passed out and hit his face on the
fireplace in [DATE]; doesn't recall getting anything in his eyes,
but had an abrasion lateral to right orbit.

EXAM:
ORBITS FOR FOREIGN BODY - 2 VIEW

[w waters (1 of 2)]
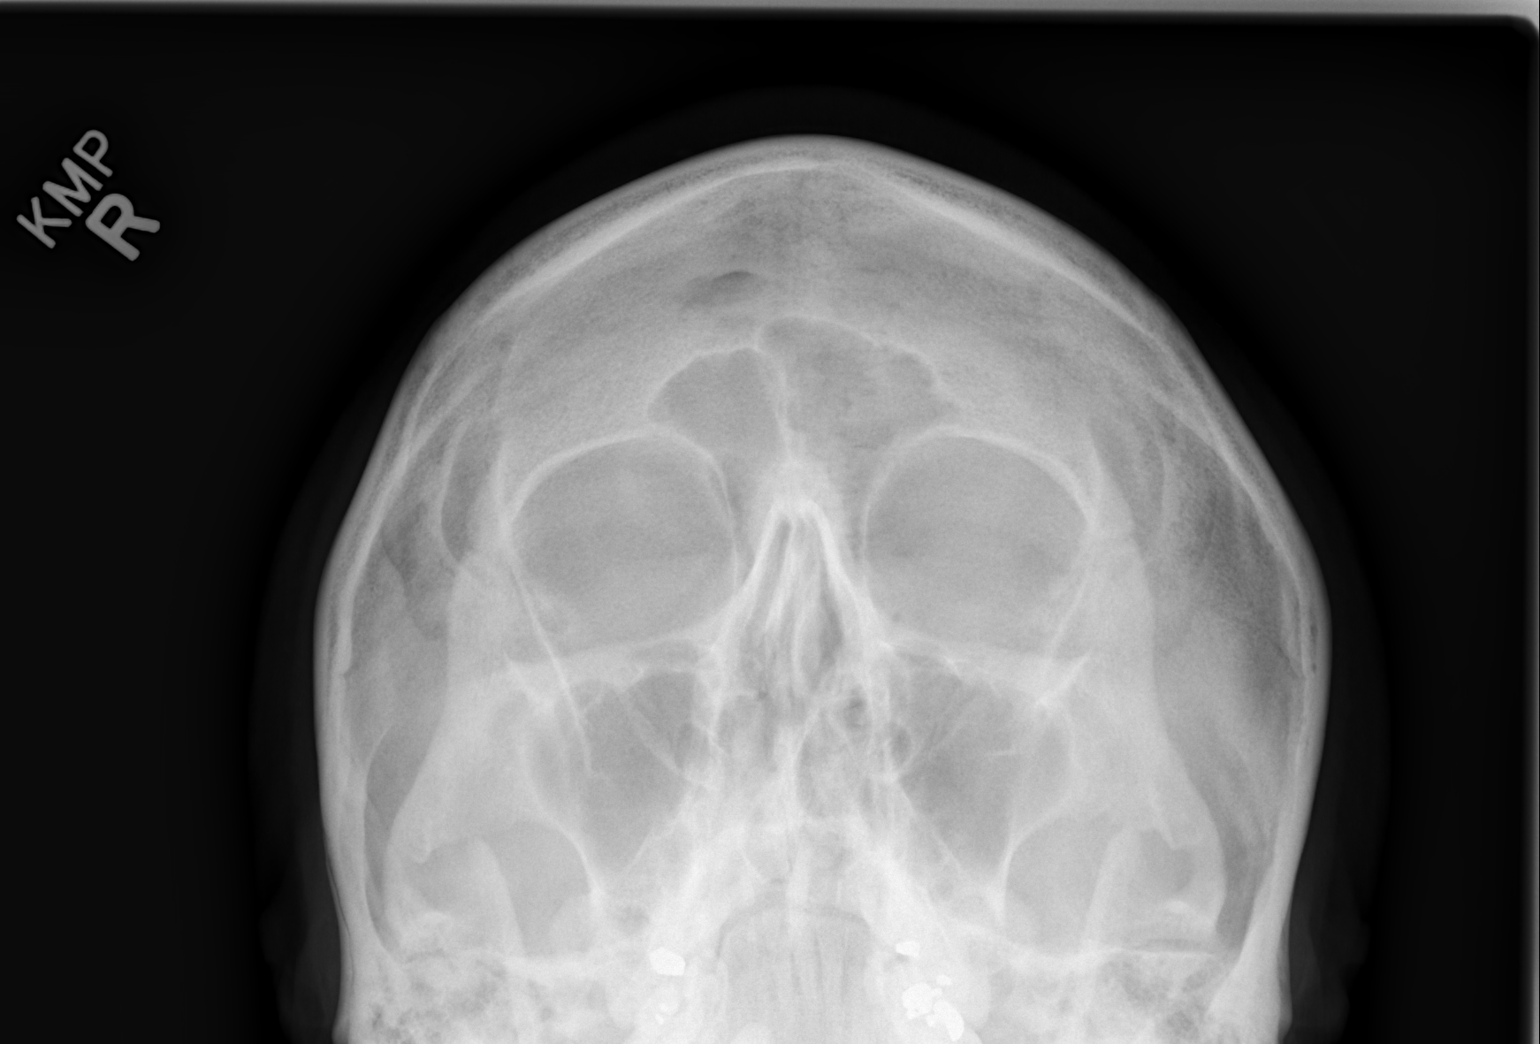

[w waters (2 of 2)]
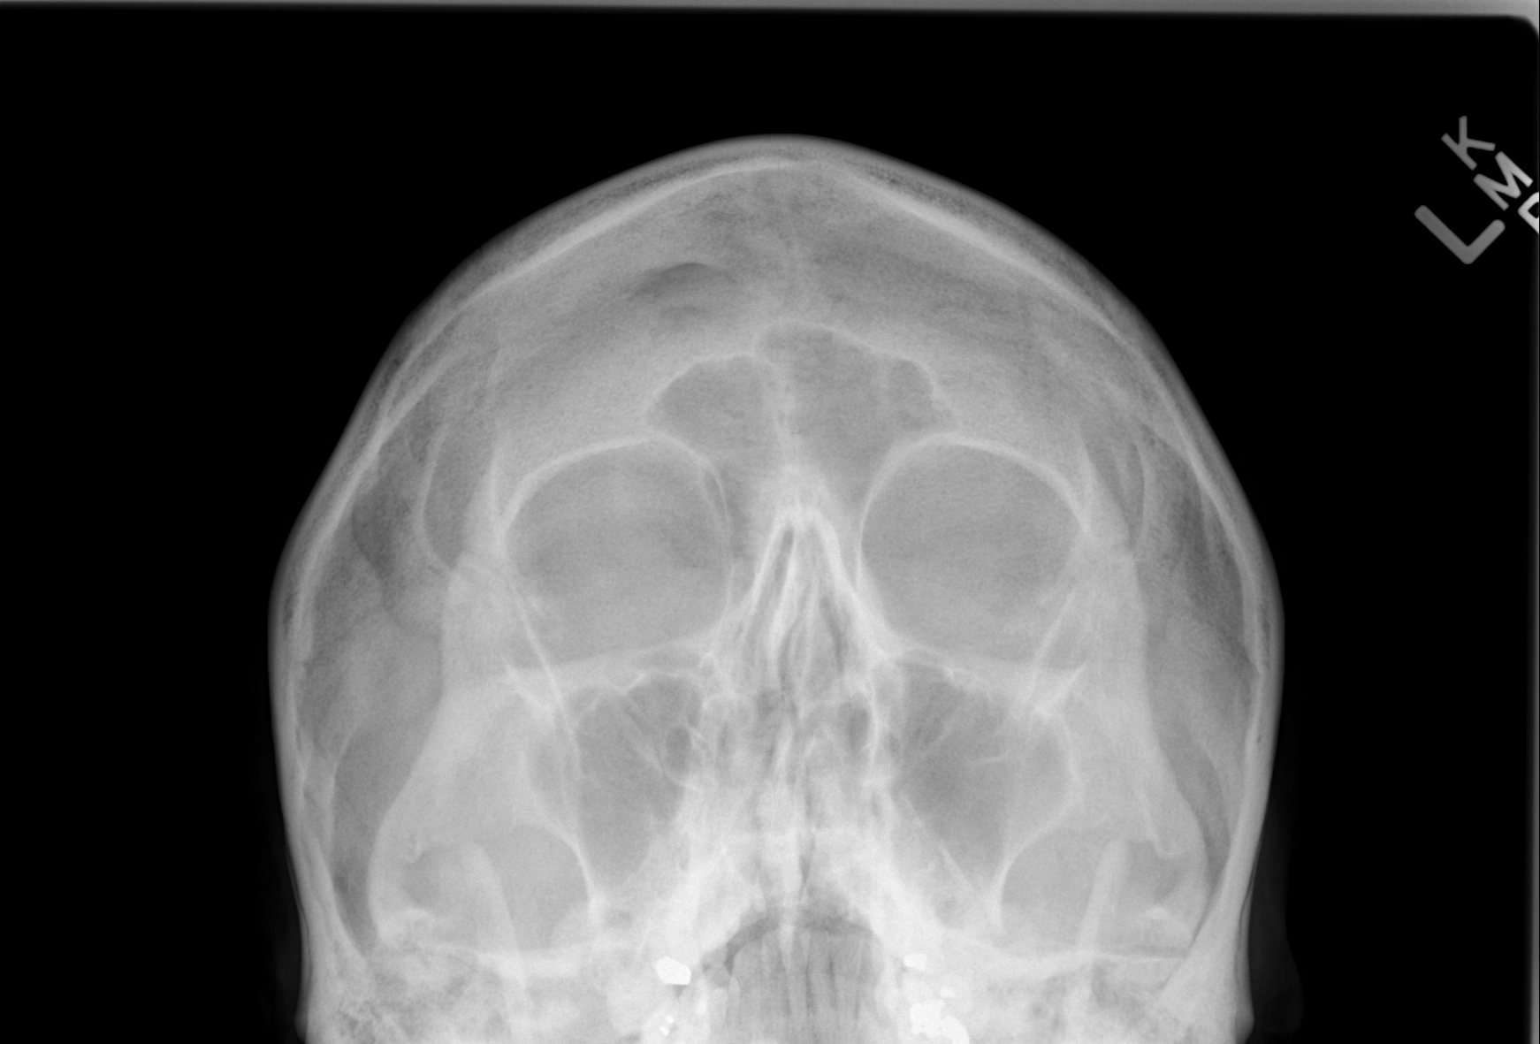

[2 of 2 positions shown; findings below may reference images not displayed]

FINDINGS: There is no evidence of metallic foreign body within the orbits. No
significant bone abnormality identified.
IMPRESSION: No evidence of metallic foreign body within the orbits.

## 2016-08-13 ENCOUNTER — Other Ambulatory Visit: Payer: Self-pay | Admitting: Neurology

## 2016-08-14 ENCOUNTER — Ambulatory Visit (INDEPENDENT_AMBULATORY_CARE_PROVIDER_SITE_OTHER): Payer: BLUE CROSS/BLUE SHIELD | Admitting: Neurology

## 2016-08-14 ENCOUNTER — Encounter: Payer: Self-pay | Admitting: Neurology

## 2016-08-14 VITALS — BP 108/76 | HR 70 | Resp 16 | Ht 68.5 in | Wt 164.0 lb

## 2016-08-14 DIAGNOSIS — R413 Other amnesia: Secondary | ICD-10-CM | POA: Diagnosis not present

## 2016-08-14 DIAGNOSIS — G4733 Obstructive sleep apnea (adult) (pediatric): Secondary | ICD-10-CM | POA: Diagnosis not present

## 2016-08-14 DIAGNOSIS — G4710 Hypersomnia, unspecified: Secondary | ICD-10-CM | POA: Diagnosis not present

## 2016-08-14 DIAGNOSIS — R569 Unspecified convulsions: Secondary | ICD-10-CM

## 2016-08-14 NOTE — Progress Notes (Signed)
[facility]  PATIENT: [name] DOB: February 11, 1961  REFERRING DOCTOR OR PCP:  Bing Matter, PA-C ([place]) SOURCE: Patient, sister, notes from his [facility], notes from the emergency room, laboratory and CTreports, CT images on PACS.  _________________________________   HISTORICAL  CHIEF COMPLAINT:  Chief Complaint  Patient presents with  . Sleep Apnea    Sts. he is compliant with CPAP.  Nasal pillows made him feel like he couldn't breathe, so he switched to a full face mask--sts. he sleeps better with it, no snoring./fim    HISTORY OF PRESENT ILLNESS:  [name] Is a 56 year old man with OSA, convulsions,  headaches and memory disturbance.   MRI of the brain 04/27/2016 showed some scattered T2/FLAIR hyperintense foci consistent with mild chronic age-related microvascular ischemic changes. I'll acute findings. There were mastoid effusions bilaterally, most likely due to eustachian tube dysfunction.  OSA:   He has moderate obstructive sleep apnea with an AHI equals 29.  He was titrated to CPAP 7 cm. He is compliant with CPAP and tolerates it well. He did better with a full face mask with nasal pillows. He no longer snores.   He feels the same the next day.       PLMS:   The PSG also showed moderate periodic limb movements of sleep but little effect on sleep fragmentation.      Seizure:   He has not had any further seizures. EEG showed no evidence of any epileptiform activity.      Seizure History:  He had a seizure around February 21, 2016.   He notes that he was very hot before the seizure and felt sick and threw up.  He felt lightheaded and fell fireplace, hitting the brick above (scratching head), he fell to the floor and continued to vomit and then had generalized shaking on/off x 4 - 5 hours.   He vomitted several times during that time and was responsive and communicative in between convulsions.     He fell asleep and was taken to his PCP the  next day.    A CT and labs were ordered.     The CT had no acute findings.   Laboratory tests showed elevated TSH (47.2), mildly low Potassium (3.4), elevated ASt (89) but normal ALT, slightly elevated WBC 11.0.   Magnesium was normal.     EEG was normal.     Headache:  He is doing better with fewer headaches.     Memory:   He has difficulty with his memory. This started on the day he had the multiple convulsions. In general, he feels that memory has improved but not quite to baseline. He feels he does not remember much of June or early July.     EPWORTH SLEEPINESS SCALE  On a scale of 0 - 3 what is the chance of dozing:  Sitting and Reading:   0 Watching TV:    3 Sitting inactive in a public place: 1 Passenger in car for one hour: 3 Lying down to rest in the afternoon: 3 Sitting and talking to someone: 0 Sitting quietly after lunch:  0 In a car, stopped in traffic:  0  Total (out of 24):   10/24  (was 12/24 last visit)     REVIEW OF SYSTEMS: Constitutional: No fevers, chills, sweats, or change in appetite.  He notes fatigue.  Insomnia some nights. Eyes: No  double vision, eye pain.  Occasional blurred vision. Ear, nose and throat: No hearing  loss, ear pain, nasal congestion, sore throat Cardiovascular: No chest pain, palpitations Respiratory: No shortness of breath at rest or with exertion.   No wheezes GastrointestinaI: No nausea, vomiting, diarrhea, abdominal pain, fecal incontinence Genitourinary: No dysuria, urinary retention or frequency.  No nocturia. Musculoskeletal: Mild neck pain, back pain.   He has had some cramps in his muscles at times and some joint pain. Integumentary: No rash, pruritus, skin lesions Neurological: as above Psychiatric: Denies depression at this time.  Notes mild anxiety Endocrine: No palpitations, diaphoresis, change in appetite, change in weigh or increased thirst Hematologic/Lymphatic: No anemia, purpura, petechiae. Allergic/Immunologic: No  itchy/runny eyes, nasal congestion, recent allergic reactions, rashes  ALLERGIES: No Known Allergies  HOME MEDICATIONS:  Current Outpatient Prescriptions:  .  amLODipine (NORVASC) 5 MG tablet, Take 5 mg by mouth., Disp: , Rfl:  .  hydrochlorothiazide (HYDRODIURIL) 25 MG tablet, Take 25 mg by mouth., Disp: , Rfl:  .  hydrOXYzine (ATARAX/VISTARIL) 25 MG tablet, Take 1 tablet (25 mg total) by mouth daily as needed for anxiety., Disp: 30 tablet, Rfl: 1 .  levothyroxine (SYNTHROID, LEVOTHROID) 150 MCG tablet, Take 1 tablet (150 mcg total) by mouth daily before breakfast., Disp: 30 tablet, Rfl: 0 .  meloxicam (MOBIC) 15 MG tablet, TAKE 1 TABLET(15 MG) BY MOUTH DAILY, Disp: 30 tablet, Rfl: 0 .  olmesartan (BENICAR) 40 MG tablet, Take 40 mg by mouth daily., Disp: , Rfl:  .  sertraline (ZOLOFT) 50 MG tablet, Take 25 mg daily for two weeks, then 50 mg daily, Disp: 30 tablet, Rfl: 1 .  traZODone (DESYREL) 100 MG tablet, Take 1 tablet (100 mg total) by mouth at bedtime as needed for sleep., Disp: 30 tablet, Rfl: 1 No current facility-administered medications for this visit.   Facility-Administered Medications Ordered in Other Visits:  .  gadopentetate dimeglumine (MAGNEVIST) injection 15 mL, 15 mL, Intravenous, Once PRN, Britt Bottom, MD  PAST MEDICAL HISTORY: Past Medical History:  Diagnosis Date  . Anxiety   . Anxiety and depression   . Depression   . Hemorrhoids   . Hypertension   . Hypothyroidism   . Seizures ([place]) 02/2016    PAST SURGICAL HISTORY: Past Surgical History:  Procedure Laterality Date  . BICEPS TENDON REPAIR Right   . CARPAL TUNNEL RELEASE Bilateral   . KNEE CARTILAGE SURGERY Left   . KNEE SURGERY Right    x 2    FAMILY HISTORY: Family History  Problem Relation Age of Onset  . Hypertension Father   . Kidney disease Mother   . Breast cancer Paternal Grandmother   . Kidney failure Maternal Grandmother   . Multiple sclerosis Sister   . Heart attack Brother      x 3  . Heart defect Maternal Uncle     SOCIAL HISTORY:  Social History   Social History  . Marital status: Married    Spouse name: [name]  . Number of children: 1  . Years of education: 48   Occupational History  . construction     remodels houses   Social History Main Topics  . Smoking status: Current Every Day Smoker    Packs/day: 2.00    Years: 20.00    Types: Cigarettes  . Smokeless tobacco: Never Used  . Alcohol use 13.2 oz/week    8 Shots of liquor, 14 Standard drinks or equivalent per week     Comment: 8 mixed drinks twice weekly, 06-20-2016 per pt no but stooped 3 months ago  . Drug  use:     Types: Marijuana     Comment: last use thc 2 months ago, 06-20-2016 per pt Marijuana   . Sexual activity: Not Currently    Birth control/ protection: None   Other Topics Concern  . Not on file   Social History Narrative   Lives with wife   Caffeine- coffee 2 cups daily     PHYSICAL EXAM  Vitals:   08/14/16 1138  BP: 108/76  Pulse: 70  Resp: 16  Weight: 164 lb (74.4 kg)  Height: 5' 8.5" (1.74 m)    Body mass index is 24.57 kg/m.   General: The patient is well-developed and well-nourished and in no acute distress  Musculoskeletal:   The mid and lower neck are mildly tender.    Neurologic Exam  Mental status: The patient is alert and oriented x 3 at the time of the examination. The patient has apparent normal recent and remote memory, with an apparently normal attention span and concentration ability.   Speech is normal.  Cranial nerves: Extraocular movements are full. Symm facial sensation.  .Facial strength is normal.  Trapezius and sternocleidomastoid strength is normal. No dysarthria is noted.  The tongue is midline, and the patient has symmetric elevation of the soft palate. No obvious hearing deficits are noted.  Motor:  Muscle bulk is normal.   Tone is normal. Strength is  5 / 5 in all 4 extremities.   Sensory: Sensory testing:   He reports decreased  temperature, soft touch and vibration sensationon the right compared to the left  Coordination: Cerebellar testing reveals good finger-nose-finger and heel-to-shin bilaterally.  Gait and station: Station is normal.   Gait is normal. Tandem gait is mildly wide. Romberg is negative.   Reflexes: Deep tendon reflexes are symmetric and normal bilaterally.        DIAGNOSTIC DATA (LABS, IMAGING, TESTING) - I reviewed patient records, labs, notes, testing and imaging myself where available.  Lab Results  Component Value Date   WBC 12.3 (H) 05/13/2016   HGB 15.3 05/13/2016   HCT 43.4 05/13/2016   MCV 97.7 05/13/2016   PLT 215 05/13/2016      Component Value Date/Time   NA 145 05/13/2016 1617   K 4.0 05/13/2016 1617   CL 108 05/13/2016 1617   CO2 26 05/13/2016 1617   GLUCOSE 98 05/13/2016 1617   BUN 10 05/13/2016 1617   CREATININE 0.99 05/13/2016 1617   CALCIUM 9.4 05/13/2016 1617   PROT 7.6 05/13/2016 1617   ALBUMIN 4.5 05/13/2016 1617   AST 16 05/13/2016 1617   ALT 9 (L) 05/13/2016 1617   ALKPHOS 76 05/13/2016 1617   BILITOT 0.3 05/13/2016 1617   GFRNONAA >60 05/13/2016 1617   GFRAA >60 05/13/2016 1617       ASSESSMENT AND PLAN  OSA (obstructive sleep apnea)  Convulsions, unspecified convulsion type (HCC)  Memory loss  Excessive sleepiness   1.    Continue CPAP.  Try to get 7 hours of sleep nightly. 2.    As EEG is normal and the seizures occurred in the setting of severe vomiting and hitting his head, I will hold off on treatment at this time.   If another seizure occurs, we will start an AED long-term. 3.    If headaches worsen again, consider adding a prophylactic agent and/or doing trigger point injection/nerve block      He will return to see me in 12 months or sooner if there are new or worsening symptoms.  Maryan Sivak A. Felecia Shelling, MD, PhD [id number], [id number] PM Certified in Neurology, Clinical Neurophysiology, Sleep Medicine, Pain Medicine and  Neuroimaging  [facility] [address] [place], [place] [address] [phone]

## 2016-08-22 NOTE — Progress Notes (Deleted)
[place] MD/PA/NP OP Progress Note  08/22/2016 8:59 AM [name]  MRN:  [id number]  Chief Complaint:   Subjective:  "Eh.. It's hard" HPI:  - Patient was evaluated by neurology for OSA and congulsion; no AED started.   Patient states that it has been hard for him. He has an upcoming court case in 12/21 and he will find whether he needs a supervision probation or spending in jail for a week due to DUI. He feels frustrated about bills and unemployment. He talks about memory loss and feels frustrated that he cannot remember things as he used to. He ha anger outburst and punches wall at times. He denies alcohol use for the past two months. He goes to [employer] every week with his wife. Although he states that he does not see the meaning in this meeting, later he states that he prefers it to [employer], as people there seem to care about others.  He endorses insomnia and will have his CPAP machine checked.   Patient wife presents to the interview.  She states that he had a seizure couple of times; he loses consciousness and had a tongue biting. Although he was evaluated by neurology (CT and MRI per report), they have not found the cause of his seizure. She is wondering whether this is secondary to his anxiety. He tends to get stressed more during Thanksgiving and christmas; he appears to be stressed more due to his court issues, his wife's surgery and financial strain. There were times he sleeps in the couch doing nothing. Patient has not taken sertraline after he was discharged.   Visit Diagnosis:  No diagnosis found.  Past Psychiatric History:  Outpatient: denies Psychiatry admission: [facility] in 10/9-10/05/2016 for agitation in the setting of alcohol use Previous suicide attempt: denies  Past trials of medication: Zoloft, Xanax Substance use: started drinking at age 59 with his father who abuses alcohol. Longest sobriety for 12 years without formal treatment. He goes to [employer]  weekly.  Past Medical History:  Past Medical History:  Diagnosis Date  . Anxiety   . Anxiety and depression   . Depression   . Hemorrhoids   . Hypertension   . Hypothyroidism   . Seizures ([place]) 02/2016    Past Surgical History:  Procedure Laterality Date  . BICEPS TENDON REPAIR Right   . CARPAL TUNNEL RELEASE Bilateral   . KNEE CARTILAGE SURGERY Left   . KNEE SURGERY Right    x 2    Family Psychiatric History:  Father- alcohol use, denies suicide attempt  Family History:  Family History  Problem Relation Age of Onset  . Hypertension Father   . Kidney disease Mother   . Breast cancer Paternal Grandmother   . Kidney failure Maternal Grandmother   . Multiple sclerosis Sister   . Heart attack Brother     x 3  . Heart defect Maternal Uncle     Social History:  Social History   Social History  . Marital status: Married    Spouse name: [name]  . Number of children: 1  . Years of education: 75   Occupational History  . construction     remodels houses   Social History Main Topics  . Smoking status: Current Every Day Smoker    Packs/day: 2.00    Years: 20.00    Types: Cigarettes  . Smokeless tobacco: Never Used  . Alcohol use 13.2 oz/week    8 Shots of liquor, 14 Standard drinks  or equivalent per week     Comment: 8 mixed drinks twice weekly, 06-20-2016 per pt no but stooped 3 months ago  . Drug use:     Types: Marijuana     Comment: last use thc 2 months ago, 06-20-2016 per pt Marijuana   . Sexual activity: Not Currently    Birth control/ protection: None   Other Topics Concern  . Not on file   Social History Narrative   Lives with wife   Caffeine- coffee 2 cups daily    Allergies: No Known Allergies  Metabolic Disorder Labs: No results found for: HGBA1C, MPG No results found for: PROLACTIN No results found for: CHOL, TRIG, HDL, CHOLHDL, VLDL, LDLCALC   Current Medications: Current Outpatient Prescriptions  Medication Sig Dispense Refill  .  amLODipine (NORVASC) 5 MG tablet Take 5 mg by mouth.    . hydrochlorothiazide (HYDRODIURIL) 25 MG tablet Take 25 mg by mouth.    . hydrOXYzine (ATARAX/VISTARIL) 25 MG tablet Take 1 tablet (25 mg total) by mouth daily as needed for anxiety. 30 tablet 1  . levothyroxine (SYNTHROID, LEVOTHROID) 150 MCG tablet Take 1 tablet (150 mcg total) by mouth daily before breakfast. 30 tablet 0  . meloxicam (MOBIC) 15 MG tablet TAKE 1 TABLET(15 MG) BY MOUTH DAILY 30 tablet 0  . olmesartan (BENICAR) 40 MG tablet Take 40 mg by mouth daily.    . sertraline (ZOLOFT) 50 MG tablet Take 25 mg daily for two weeks, then 50 mg daily 30 tablet 1  . traZODone (DESYREL) 100 MG tablet Take 1 tablet (100 mg total) by mouth at bedtime as needed for sleep. 30 tablet 1   No current facility-administered medications for this visit.    Facility-Administered Medications Ordered in Other Visits  Medication Dose Route Frequency Provider Last Rate Last Dose  . gadopentetate dimeglumine (MAGNEVIST) injection 15 mL  15 mL Intravenous Once PRN Britt Bottom, MD        Neurologic: Headache: No Seizure: No Paresthesias: No  Musculoskeletal: Strength & Muscle Tone: within normal limits Gait & Station: normal Patient leans: N/A  Psychiatric Specialty Exam: Review of Systems  Neurological: Positive for seizures and headaches. Negative for dizziness and tremors.  Psychiatric/Behavioral: Positive for depression and suicidal ideas. Negative for hallucinations and substance abuse. The patient is nervous/anxious and has insomnia.   All other systems reviewed and are negative.   There were no vitals taken for this visit.There is no height or weight on file to calculate BMI.  General Appearance: Casual  Eye Contact:  Good  Speech:  Clear and Coherent  Volume:  Normal  Mood:  "fine"  Affect:  Restricted  Thought Process:  Coherent and Goal Directed  Orientation:  Full (Time, Place, and Person)  Thought Content: Logical    Suicidal Thoughts:  Yes.  without intent/plan  Homicidal Thoughts:  No  Memory:  Immediate;   Good Recent;   Good Remote;   Good  Judgement:  Good  Insight:  Good  Psychomotor Activity:  Normal  Concentration:  Concentration: Good and Attention Span: Good  Recall:  Good  Fund of Knowledge: Good  Language: Good  Akathisia:  NA  Handed:  Right  AIMS (if indicated):  N/A  Assets:  Communication Skills Desire for Improvement  ADL's:  Intact  Cognition: WNL  Sleep:  poor   Assessment [name] is a 56 year old male with alcohol use disorder, hypothyroidism, hypertension. He has recent admission to [facility] on 10/9-05/2016, originally  brought to ED by sheriff for agitation in the setting of alcohol use.   # Adjustment disorder with anxiety and depressed mood # r/o GAD # r/o MDD Patient endorses anxiety in the setting of upcoming court and financial strain. He has not taken sertraline since discharged; will restart this medication to target his mood. Noted that patient has had a couple of seizure like episode, which he is currently under evaluation by neurology. Will continue to monitor. Will have prn hydroxyzine for anxiety. Will increase Trazodone to target insomnia.   # Alcohol use disorder in early remission Both patient and wife states that he has been abstinent from alcohol for the past two months. Patient goes to al anon meeting every week with his wife; will continue motivational interview. Although he would be a good candidate for naltrexone or gabapentin, he declines this option. Continue to discuss as needed.   Noted that patient has high BP on today's evaluation. Patient is advised to measure home BP and is instructed to see his PCP if SBP remains >180. Patient reports chronic headache but denies other physical symptoms.   Plan 1. Start sertraline 25 mg daily for two weeks, then 50 mg daily 2. Continue hydroxyzine 25 mg daily as needed for anxiety 3. Increase  Trazodone 100 mg at night as needed for sleep 4. Continue going to [place] or AA meeting 5. Return to clinic in January  The patient demonstrates the following risk factors for suicide: Chronic risk factors for suicide include: psychiatric disorder of anxiety and substance use disorder. Acute risk factors for suicide include: unemployment and recent discharge from inpatient psychiatry. Protective factors for this patient include: hope for the future. Considering these factors, the overall suicide risk at this point appears to be low. Patient is appropriate for outpatient follow up. Patient does have a gun access which is safely secured.  Treatment Plan Summary: Plan as above  Norman Clay, MD 08/22/2016, 8:59 AM

## 2016-08-24 ENCOUNTER — Ambulatory Visit (HOSPITAL_COMMUNITY): Payer: Self-pay | Admitting: Psychiatry

## 2016-08-28 ENCOUNTER — Telehealth (HOSPITAL_COMMUNITY): Payer: Self-pay | Admitting: *Deleted

## 2016-08-28 NOTE — Telephone Encounter (Signed)
returned phone call regarding appointment.   Patient's wife answered phone and said he is incarcerated this week.

## 2016-09-13 ENCOUNTER — Other Ambulatory Visit: Payer: Self-pay | Admitting: Neurology

## 2016-09-13 NOTE — Progress Notes (Addendum)
Man MD/PA/NP OP Progress Note  09/15/2016 11:39 AM [name]  MRN:  [id number]  Chief Complaint:  Chief Complaint    Depression; Anxiety; Follow-up     Subjective:  "Eh.. It's good" HPI:  - Patient was evaluated by neurology for OSA and convulsion; no AED started.   Patient presents for follow-up appointment. He went to jail for 10 days and got out about a week ago. He is concerned whether he can get a new job. He feels anxious at times, although he denies panic attack. He denies anger outburst, although he also reports that he did not have any reason to get angry.  He sleeps 4-5 hours and is planning to get back on CPAP machine. He denies SI. He has not drink for 4 months, reporting that he will lose his wife he relapses again. He goes to [employer] every week with his wife, except last week. He denies AH/VH. Trazodone and hydroxyzine was discontinued while he was in jail. He feels frustrated as he felt weak and had a fall the other day. He reports slight dizziness.   Visit Diagnosis:    ICD-9-CM ICD-10-CM   1. Alcohol use disorder, moderate, in early remission ([place]) 305.03 F10.21   2. Adjustment disorder with mixed anxiety and depressed mood 309.28 F43.23     Past Psychiatric History:  Outpatient: denies Psychiatry admission: [place] in 10/9-10/05/2016 for agitation in the setting of alcohol use Previous suicide attempt: denies  Past trials of medication: Zoloft, Xanax Substance use: started drinking at age 41 with his father who abuses alcohol. Longest sobriety for 12 years without formal treatment. He goes to [employer] weekly.  Past Medical History:  Past Medical History:  Diagnosis Date  . Anxiety   . Anxiety and depression   . Depression   . Hemorrhoids   . Hypertension   . Hypothyroidism   . Seizures ([place]) 02/2016    Past Surgical History:  Procedure Laterality Date  . BICEPS TENDON REPAIR Right   . CARPAL TUNNEL RELEASE Bilateral   . KNEE CARTILAGE SURGERY  Left   . KNEE SURGERY Right    x 2    Family Psychiatric History:  Father- alcohol use, denies suicide attempt  Family History:  Family History  Problem Relation Age of Onset  . Hypertension Father   . Kidney disease Mother   . Breast cancer Paternal Grandmother   . Kidney failure Maternal Grandmother   . Multiple sclerosis Sister   . Heart attack Brother     x 3  . Heart defect Maternal Uncle     Social History:  Social History   Social History  . Marital status: Married    Spouse name: [name]  . Number of children: 1  . Years of education: 37   Occupational History  . construction     remodels houses   Social History Main Topics  . Smoking status: Current Every Day Smoker    Packs/day: 2.00    Years: 20.00    Types: Cigarettes  . Smokeless tobacco: Never Used  . Alcohol use 13.2 oz/week    8 Shots of liquor, 14 Standard drinks or equivalent per week     Comment: 8 mixed drinks twice weekly, 06-20-2016 per pt no but stooped 3 months ago  . Drug use: Yes    Types: Marijuana     Comment: last use thc 2 months ago, 06-20-2016 per pt Marijuana   . Sexual activity: Not Currently  Birth control/ protection: None   Other Topics Concern  . Not on file   Social History Narrative   Lives with wife   Caffeine- coffee 2 cups daily    Allergies: No Known Allergies  Metabolic Disorder Labs: No results found for: HGBA1C, MPG No results found for: PROLACTIN No results found for: CHOL, TRIG, HDL, CHOLHDL, VLDL, LDLCALC   Current Medications: Current Outpatient Prescriptions  Medication Sig Dispense Refill  . amLODipine (NORVASC) 5 MG tablet Take 5 mg by mouth.    . hydrochlorothiazide (HYDRODIURIL) 25 MG tablet Take 25 mg by mouth.    . levothyroxine (SYNTHROID, LEVOTHROID) 150 MCG tablet Take 1 tablet (150 mcg total) by mouth daily before breakfast. 30 tablet 0  . meloxicam (MOBIC) 15 MG tablet TAKE 1 TABLET(15 MG) BY MOUTH DAILY 30 tablet 0  . olmesartan  (BENICAR) 40 MG tablet Take 40 mg by mouth daily.    . sertraline (ZOLOFT) 100 MG tablet Take 1 tablet (100 mg total) by mouth daily. 30 tablet 1   No current facility-administered medications for this visit.    Facility-Administered Medications Ordered in Other Visits  Medication Dose Route Frequency Provider Last Rate Last Dose  . gadopentetate dimeglumine (MAGNEVIST) injection 15 mL  15 mL Intravenous Once PRN Britt Bottom, MD        Neurologic: Headache: No Seizure: No Paresthesias: No  Musculoskeletal: Strength & Muscle Tone: within normal limits Gait & Station: normal Patient leans: N/A  Psychiatric Specialty Exam: Review of Systems  Neurological: Positive for dizziness, seizures and headaches. Negative for tremors.  Psychiatric/Behavioral: Positive for depression. Negative for hallucinations, substance abuse and suicidal ideas. The patient is nervous/anxious and has insomnia.   All other systems reviewed and are negative.   Blood pressure 107/68, pulse 64, weight 158 lb (71.7 kg).Body mass index is 23.67 kg/m.  General Appearance: Casual  Eye Contact:  Good  Speech:  Clear and Coherent  Volume:  Normal  Mood:  "good"  Affect:  Restricted  Thought Process:  Coherent and Goal Directed  Orientation:  Full (Time, Place, and Person)  Thought Content: Logical Perceptions: denies AH/VH  Suicidal Thoughts:  No  Homicidal Thoughts:  No  Memory:  Immediate;   Good Recent;   Good Remote;   Good  Judgement:  Good  Insight:  Good  Psychomotor Activity:  Normal  Concentration:  Concentration: Good and Attention Span: Good  Recall:  Good  Fund of Knowledge: Good  Language: Good  Akathisia:  NA  Handed:  Right  AIMS (if indicated):  N/A  Assets:  Communication Skills Desire for Improvement  ADL's:  Intact  Cognition: WNL  Sleep:  poor   Assessment [name] is a 56 year old male with alcohol use disorder, hypothyroidism, hypertension. He has recent  admission to [facility] on 10/9-05/2016, originally brought to ED by sheriff for agitation in the setting of alcohol use. Psychosocial stressors including unemployment and recent court issue (was in jail for 10 days).  # Adjustment disorder with anxiety and depressed mood # r/o GAD # r/o MDD There has been slight improvement in his neurovegetative symptoms and anxiety, which coincided with starting sertraline and his court issues being resolved. Will uptitrate sertraline to optimize its effect. Will not restart hydroxyzine/trazodone given patient is doing relatively well without these medication.    # Alcohol use disorder in early remission Patient is motivated for sobriety and has been abstinent for 4 months. Patient goes to al anon meeting every  week with his wife; will continue motivational interview. Although he would be a good candidate for naltrexone or gabapentin, he declines this option. Continue to discuss as needed.   Plan 1. Increase sertraline 100 mg daily 2. Return to clinic in one month  The patient demonstrates the following risk factors for suicide: Chronic risk factors for suicide include: psychiatric disorder of anxiety and substance use disorder. Acute risk factors for suicide include: unemployment and recent discharge from inpatient psychiatry. Protective factors for this patient include: hope for the future. Considering these factors, the overall suicide risk at this point appears to be low. Patient is appropriate for outpatient follow up. Patient does have a gun access which is safely secured.  Treatment Plan Summary: Plan as above  Norman Clay, MD 09/15/2016, 11:39 AM

## 2016-09-15 ENCOUNTER — Ambulatory Visit (INDEPENDENT_AMBULATORY_CARE_PROVIDER_SITE_OTHER): Payer: BLUE CROSS/BLUE SHIELD | Admitting: Psychiatry

## 2016-09-15 VITALS — BP 107/68 | HR 64 | Wt 158.0 lb

## 2016-09-15 DIAGNOSIS — F1021 Alcohol dependence, in remission: Secondary | ICD-10-CM | POA: Diagnosis not present

## 2016-09-15 DIAGNOSIS — Z841 Family history of disorders of kidney and ureter: Secondary | ICD-10-CM

## 2016-09-15 DIAGNOSIS — Z811 Family history of alcohol abuse and dependence: Secondary | ICD-10-CM

## 2016-09-15 DIAGNOSIS — F1290 Cannabis use, unspecified, uncomplicated: Secondary | ICD-10-CM

## 2016-09-15 DIAGNOSIS — Z9889 Other specified postprocedural states: Secondary | ICD-10-CM

## 2016-09-15 DIAGNOSIS — F4323 Adjustment disorder with mixed anxiety and depressed mood: Secondary | ICD-10-CM | POA: Diagnosis not present

## 2016-09-15 DIAGNOSIS — Z803 Family history of malignant neoplasm of breast: Secondary | ICD-10-CM

## 2016-09-15 DIAGNOSIS — Z8249 Family history of ischemic heart disease and other diseases of the circulatory system: Secondary | ICD-10-CM

## 2016-09-15 DIAGNOSIS — F17210 Nicotine dependence, cigarettes, uncomplicated: Secondary | ICD-10-CM

## 2016-09-15 DIAGNOSIS — Z79899 Other long term (current) drug therapy: Secondary | ICD-10-CM

## 2016-09-15 MED ORDER — SERTRALINE HCL 100 MG PO TABS: 100.0000 mg | ORAL_TABLET | Freq: Every day | ORAL | 1 refills | Status: DC

## 2016-09-15 NOTE — Patient Instructions (Signed)
1. Increase sertraline 100 mg daily 2. Return to clinic in one month

## 2016-09-25 ENCOUNTER — Telehealth (HOSPITAL_COMMUNITY): Payer: Self-pay | Admitting: *Deleted

## 2016-09-25 NOTE — Telephone Encounter (Signed)
Opened in Error.

## 2016-09-25 NOTE — Telephone Encounter (Signed)
Pt pharmacy sent refill request to office for pt Trazodone 100 mg, Hydroxyzine and Zoloft 50 mg . Per pt chart on 09-15-16, provider stating pt Will not restart hydroxyzine/trazodone. Called pt pharmacy and spoke with Lillia and informed her. She then asked about pt Zoloft 50 mg. Informed Lillia that per pt chart pt is taking 100 mg not 50 mg and that was sent on 09-15-2016 and she verbalized understanding.

## 2016-09-29 ENCOUNTER — Telehealth (HOSPITAL_COMMUNITY): Payer: Self-pay | Admitting: *Deleted

## 2016-09-29 ENCOUNTER — Other Ambulatory Visit (HOSPITAL_COMMUNITY): Payer: Self-pay | Admitting: Psychiatry

## 2016-09-29 MED ORDER — TRAZODONE HCL 100 MG PO TABS: 100.0000 mg | ORAL_TABLET | Freq: Every evening | ORAL | 0 refills | Status: DC | PRN

## 2016-09-29 NOTE — Telephone Encounter (Signed)
Called [name] back to inform him that per note in chart from 09-15-2016, provider's note stated that he was token off his Trazodone in jail and provider never refilled it. [name] verbalized understanding. Per [name] he would like to see if Dr. Modesta Messing could re prescribe it for him because he did not have it last night and he did not sleep at all. Per [name] he needs his Trazodone. Per [name] he is still taking his Hydroxyzine.

## 2016-09-29 NOTE — Telephone Encounter (Signed)
Ordered Trazodone for 30 days.

## 2016-09-29 NOTE — Telephone Encounter (Signed)
phone call from patient, he said [facility] said that Dr. Modesta Messing took him off the Trazodone 100 mg.  He said that helps him sleep.

## 2016-09-29 NOTE — Telephone Encounter (Signed)
Called pt and informed him and he verbalized understanding.

## 2016-10-11 NOTE — Progress Notes (Signed)
[place] MD/PA/NP OP Progress Note  10/13/2016 11:17 AM [name]  MRN:  [id number]  Chief Complaint:  Chief Complaint    Drug / Alcohol Assessment; Anxiety; Follow-up     Subjective:  "Eh.. It's good" HPI:  - Patient was evaluated by neurology for OSA and convulsion; no AED started.   Patient presents for follow up appointment. He states that he is overall doing better, especially after he started contract work a few days ago. He feels more motivated and sleeps better last night. He will meet with his grandchildren (age 59,8) in his neighbor. He denies SI. He denies craving for alcohol and has been abstinent for four months. He goes to [place] with his wife and his mother every week. He had a "seizure" of grinding his teeth last Saturday. He denies any other seizure like episode.   Visit Diagnosis:    ICD-9-CM ICD-10-CM   1. Alcohol use disorder, moderate, in early remission ([place]) 305.03 F10.21   2. Adjustment disorder with mixed anxiety and depressed mood 309.28 F43.23     Past Psychiatric History:  Outpatient: denies Psychiatry admission: [place] in 10/9-10/05/2016 for agitation in the setting of alcohol use Previous suicide attempt: denies  Past trials of medication: Zoloft, Xanax Substance use: started drinking at age 58 with his father who abuses alcohol. Longest sobriety for 12 years without formal treatment. He goes to [employer] weekly.  Past Medical History:  Past Medical History:  Diagnosis Date  . Anxiety   . Anxiety and depression   . Depression   . Hemorrhoids   . Hypertension   . Hypothyroidism   . Seizures ([place]) 02/2016    Past Surgical History:  Procedure Laterality Date  . BICEPS TENDON REPAIR Right   . CARPAL TUNNEL RELEASE Bilateral   . KNEE CARTILAGE SURGERY Left   . KNEE SURGERY Right    x 2    Family Psychiatric History:  Father- alcohol use, denies suicide attempt  Family History:  Family History  Problem Relation Age of Onset  . Hypertension  Father   . Kidney disease Mother   . Breast cancer Paternal Grandmother   . Kidney failure Maternal Grandmother   . Multiple sclerosis Sister   . Heart attack Brother     x 3  . Heart defect Maternal Uncle     Social History:  Social History   Social History  . Marital status: Married    Spouse name: [name]  . Number of children: 1  . Years of education: 59   Occupational History  . construction     remodels houses   Social History Main Topics  . Smoking status: Current Every Day Smoker    Packs/day: 1.50    Years: 20.00    Types: Cigarettes  . Smokeless tobacco: Never Used  . Alcohol use 13.2 oz/week    8 Shots of liquor, 14 Standard drinks or equivalent per week     Comment: 8 mixed drinks twice weekly, 06-20-2016 per pt no but stooped 3 months ago, 10-13-2016 per pt not anymore Per pt its been about 3-4 months ago.  . Drug use: Yes    Types: Marijuana     Comment: last use thc 2 months ago, 06-20-2016 per pt Marijuana . 10-13-2016 per pt not anymore her stopped 10-12 years apg.  . Sexual activity: Not Currently    Birth control/ protection: None   Other Topics Concern  . None   Social History Narrative   Lives with  wife   Caffeine- coffee 2 cups daily    Allergies: No Known Allergies  Metabolic Disorder Labs: No results found for: HGBA1C, MPG No results found for: PROLACTIN No results found for: CHOL, TRIG, HDL, CHOLHDL, VLDL, LDLCALC   Current Medications: Current Outpatient Prescriptions  Medication Sig Dispense Refill  . amLODipine (NORVASC) 5 MG tablet Take 5 mg by mouth.    . hydrochlorothiazide (HYDRODIURIL) 25 MG tablet Take 25 mg by mouth.    . levothyroxine (SYNTHROID, LEVOTHROID) 150 MCG tablet Take 1 tablet (150 mcg total) by mouth daily before breakfast. 30 tablet 0  . meloxicam (MOBIC) 15 MG tablet TAKE 1 TABLET(15 MG) BY MOUTH DAILY 30 tablet 0  . olmesartan (BENICAR) 40 MG tablet Take 40 mg by mouth daily.    . sertraline (ZOLOFT) 100 MG  tablet Take 1 tablet (100 mg total) by mouth daily. 30 tablet 1  . traZODone (DESYREL) 100 MG tablet Take 1 tablet (100 mg total) by mouth at bedtime as needed for sleep. 40 tablet 0   No current facility-administered medications for this visit.    Facility-Administered Medications Ordered in Other Visits  Medication Dose Route Frequency Provider Last Rate Last Dose  . gadopentetate dimeglumine (MAGNEVIST) injection 15 mL  15 mL Intravenous Once PRN Britt Bottom, MD        Neurologic: Headache: No Seizure: No Paresthesias: No  Musculoskeletal: Strength & Muscle Tone: within normal limits Gait & Station: normal Patient leans: N/A  Psychiatric Specialty Exam: Review of Systems  Neurological: Positive for seizures. Negative for dizziness, tremors and headaches.  Psychiatric/Behavioral: Positive for depression. Negative for hallucinations, substance abuse and suicidal ideas. The patient is nervous/anxious. The patient does not have insomnia.   All other systems reviewed and are negative.   Blood pressure 91/62, pulse 63, height 5' 8.5" (1.74 m), weight 157 lb (71.2 kg), SpO2 93 %.Body mass index is 23.52 kg/m.  General Appearance: Casual  Eye Contact:  Good  Speech:  Clear and Coherent  Volume:  Normal  Mood:  "good"  Affect:  Restricted-improving  Thought Process:  Coherent and Goal Directed  Orientation:  Full (Time, Place, and Person)  Thought Content: Logical Perceptions: denies AH/VH  Suicidal Thoughts:  No  Homicidal Thoughts:  No  Memory:  Immediate;   Good Recent;   Good Remote;   Good  Judgement:  Good  Insight:  Good  Psychomotor Activity:  Normal  Concentration:  Concentration: Good and Attention Span: Good  Recall:  Good  Fund of Knowledge: Good  Language: Good  Akathisia:  NA  Handed:  Right  AIMS (if indicated):  N/A  Assets:  Communication Skills Desire for Improvement  ADL's:  Intact  Cognition: WNL  Sleep:  fair   Assessment [name]  is a 56 year old male with alcohol use disorder, hypothyroidism, hypertension, sleep apnea, unspecified convulsion who present for follow up for his alcohol use and anxiety. He has recent admission to [facility] on 10/9-05/2016, originally brought to ED by sheriff for agitation in the setting of alcohol use. Psychosocial stressors including unemployment and recent court issue (was in jail for 10 days).  # Adjustment disorder with anxiety and depressed mood # r/o GAD # r/o MDD Exam is notable for his less restricted affect and there has been significant improvement in his mood symptoms since he started work. Will continue sertraline at the current dose. Discussed behavioral activation and sleep hygiene for his insomnia.    # Alcohol use  disorder in early remission Patient is motivated for sobriety and has been abstinent for 4 months. Patient goes to al anon meeting every week with his wife; will continue motivational interview. Although he would be a good candidate for naltrexone or gabapentin, he declines this option. Continue to discuss as needed.   Plan 1. Continue sertraline 100 mg daily 2. Return to clinic in one month  The patient demonstrates the following risk factors for suicide: Chronic risk factors for suicide include: psychiatric disorder of anxiety and substance use disorder. Acute risk factors for suicide include: unemployment and recent discharge from inpatient psychiatry. Protective factors for this patient include: hope for the future. Considering these factors, the overall suicide risk at this point appears to be low. Patient is appropriate for outpatient follow up. Patient does have a gun access which is safely secured.  Treatment Plan Summary: Plan as above  Norman Clay, MD 10/13/2016, 11:17 AM

## 2016-10-13 ENCOUNTER — Ambulatory Visit (INDEPENDENT_AMBULATORY_CARE_PROVIDER_SITE_OTHER): Payer: BLUE CROSS/BLUE SHIELD | Admitting: Psychiatry

## 2016-10-13 ENCOUNTER — Encounter (HOSPITAL_COMMUNITY): Payer: Self-pay | Admitting: Psychiatry

## 2016-10-13 VITALS — BP 91/62 | HR 63 | Ht 68.5 in | Wt 157.0 lb

## 2016-10-13 DIAGNOSIS — Z79899 Other long term (current) drug therapy: Secondary | ICD-10-CM | POA: Diagnosis not present

## 2016-10-13 DIAGNOSIS — F1021 Alcohol dependence, in remission: Secondary | ICD-10-CM

## 2016-10-13 DIAGNOSIS — F17210 Nicotine dependence, cigarettes, uncomplicated: Secondary | ICD-10-CM

## 2016-10-13 DIAGNOSIS — F4323 Adjustment disorder with mixed anxiety and depressed mood: Secondary | ICD-10-CM

## 2016-10-13 MED ORDER — SERTRALINE HCL 100 MG PO TABS: 100.0000 mg | ORAL_TABLET | Freq: Every day | ORAL | 1 refills | Status: DC

## 2016-10-13 NOTE — Patient Instructions (Signed)
1. Continue sertraline 100 mg daily 2. Return to clinic in one month

## 2016-10-16 ENCOUNTER — Other Ambulatory Visit: Payer: Self-pay | Admitting: Neurology

## 2016-10-24 ENCOUNTER — Telehealth (HOSPITAL_COMMUNITY): Payer: Self-pay | Admitting: *Deleted

## 2016-10-24 NOTE — Telephone Encounter (Signed)
Office do not fill this medication

## 2016-10-24 NOTE — Telephone Encounter (Signed)
phone call from patient, said he just ran out of hydrochlorothiazide (HYDRODIURIL) 25 MG tablet.   Patient said to send to [facility] in [place].

## 2016-10-25 ENCOUNTER — Other Ambulatory Visit (HOSPITAL_COMMUNITY): Payer: Self-pay | Admitting: Psychiatry

## 2016-10-25 ENCOUNTER — Telehealth (HOSPITAL_COMMUNITY): Payer: Self-pay | Admitting: *Deleted

## 2016-10-25 MED ORDER — HYDROXYZINE HCL 25 MG PO TABS: 25.0000 mg | ORAL_TABLET | Freq: Three times a day (TID) | ORAL | 0 refills | Status: DC | PRN

## 2016-10-25 NOTE — Telephone Encounter (Signed)
Called pt due to previous phone call. Per pt, he just ran out of his Hydroxyzine couple days ago. Per pt he lost the paper that provider gived him and don't remember if she told him to keep taking it or not. Per pt it was for his anxiety. Per pt chart, medication was stopped in jail. Per pt he would like to know what medication he should take for his Anxiety if Dr. Modesta Messing do not want him to take this medication anymore. Pt number is [phone]

## 2016-10-25 NOTE — Telephone Encounter (Signed)
lmtcb

## 2016-10-25 NOTE — Telephone Encounter (Signed)
Ordered hydroxyzine. Please inform the patient about this order and recommend him to make a follow up appointment early in April (one month from the last appointment).

## 2016-11-06 ENCOUNTER — Telehealth (HOSPITAL_COMMUNITY): Payer: Self-pay

## 2016-11-06 NOTE — Telephone Encounter (Signed)
Medication refill request - Fax refill request for Trazodone received for patient. Last ordered 06/20/16 for one time.  No order from last appt. 10/13/16 and pt. has no follow up appt. scheduled.

## 2016-11-06 NOTE — Telephone Encounter (Signed)
Medication management - Telephone call with patient's [facility] in [place] with pharmacist to inform patient would need an appointment to come in and speak with Dr. Modesta Messing about requested new order for Trazodone.

## 2016-11-06 NOTE — Telephone Encounter (Signed)
No refill unless he has follow up appointment.

## 2016-11-07 NOTE — Telephone Encounter (Signed)
noted 

## 2016-11-16 ENCOUNTER — Encounter (HOSPITAL_COMMUNITY): Payer: Self-pay | Admitting: Psychiatry

## 2016-11-16 ENCOUNTER — Ambulatory Visit (INDEPENDENT_AMBULATORY_CARE_PROVIDER_SITE_OTHER): Payer: BLUE CROSS/BLUE SHIELD | Admitting: Psychiatry

## 2016-11-16 VITALS — BP 166/91 | HR 73 | Ht 68.5 in | Wt 167.4 lb

## 2016-11-16 DIAGNOSIS — F17210 Nicotine dependence, cigarettes, uncomplicated: Secondary | ICD-10-CM | POA: Diagnosis not present

## 2016-11-16 DIAGNOSIS — F1021 Alcohol dependence, in remission: Secondary | ICD-10-CM

## 2016-11-16 DIAGNOSIS — F4323 Adjustment disorder with mixed anxiety and depressed mood: Secondary | ICD-10-CM | POA: Diagnosis not present

## 2016-11-16 DIAGNOSIS — F1290 Cannabis use, unspecified, uncomplicated: Secondary | ICD-10-CM | POA: Diagnosis not present

## 2016-11-16 DIAGNOSIS — Z79899 Other long term (current) drug therapy: Secondary | ICD-10-CM | POA: Diagnosis not present

## 2016-11-16 MED ORDER — SERTRALINE HCL 100 MG PO TABS: 100.0000 mg | ORAL_TABLET | Freq: Every day | ORAL | 1 refills | Status: DC

## 2016-11-16 MED ORDER — TRAZODONE HCL 50 MG PO TABS: 50.0000 mg | ORAL_TABLET | Freq: Every evening | ORAL | 1 refills | Status: DC | PRN

## 2016-11-16 MED ORDER — HYDROXYZINE HCL 25 MG PO TABS: 25.0000 mg | ORAL_TABLET | Freq: Every day | ORAL | 1 refills | Status: DC | PRN

## 2016-11-16 NOTE — Progress Notes (Signed)
[place] MD/PA/NP OP Progress Note  11/16/2016 10:16 AM [name]  MRN:  [id number]  Chief Complaint:  Chief Complaint    Anxiety; Depression     Subjective:  "I feel pretty normal" HPI:  - Patient was evaluated by neurology for OSA and convulsion; no AED started.   Patient states that he is doing well since the last appointment. He has started job for [employer], and it makes him feel better to distract himself. He talks about hypertension and had discontinued trazodone. He reports occasional insomnia, although he uses CPAP machine. He takes hydroxyzine once a day for anxiety. He denies seizure for the past two months. He goes to [employer] every week. He denies alcohol craving. He has not drink alcohol for the past seven months. He denies drug use. He denies SI.    Visit Diagnosis:    ICD-9-CM ICD-10-CM   1. Adjustment disorder with mixed anxiety and depressed mood 309.28 F43.23   2. Alcohol use disorder, moderate, in early remission ([place]) 305.03 F10.21     Past Psychiatric History:  Outpatient: denies Psychiatry admission: [place] in 10/9-10/05/2016 for agitation in the setting of alcohol use Previous suicide attempt: denies  Past trials of medication: Zoloft, Xanax Substance use: started drinking at age 70 with his father who abuses alcohol. Longest sobriety for 12 years without formal treatment. He goes to [employer] weekly.  Past Medical History:  Past Medical History:  Diagnosis Date  . Anxiety   . Anxiety and depression   . Depression   . Hemorrhoids   . Hypertension   . Hypothyroidism   . Seizures ([place]) 02/2016    Past Surgical History:  Procedure Laterality Date  . BICEPS TENDON REPAIR Right   . CARPAL TUNNEL RELEASE Bilateral   . KNEE CARTILAGE SURGERY Left   . KNEE SURGERY Right    x 2    Family Psychiatric History:  Father- alcohol use, denies suicide attempt  Family History:  Family History  Problem Relation Age of Onset  . Hypertension Father    . Kidney disease Mother   . Breast cancer Paternal Grandmother   . Kidney failure Maternal Grandmother   . Multiple sclerosis Sister   . Heart attack Brother     x 3  . Heart defect Maternal Uncle     Social History:  Social History   Social History  . Marital status: Married    Spouse name: [name]  . Number of children: 1  . Years of education: 37   Occupational History  . construction     remodels houses   Social History Main Topics  . Smoking status: Current Every Day Smoker    Packs/day: 1.50    Years: 20.00    Types: Cigarettes  . Smokeless tobacco: Never Used  . Alcohol use 13.2 oz/week    8 Shots of liquor, 14 Standard drinks or equivalent per week     Comment: 8 mixed drinks twice weekly, 06-20-2016 per pt no but stooped 3 months ago, 10-13-2016 per pt not anymore Per pt its been about 3-4 months ago.  . Drug use: Yes    Types: Marijuana     Comment: last use thc 2 months ago, 06-20-2016 per pt Marijuana . 10-13-2016 per pt not anymore her stopped 10-12 years apg.  . Sexual activity: Not Currently    Birth control/ protection: None   Other Topics Concern  . None   Social History Narrative   Lives with wife  Caffeine- coffee 2 cups daily    Allergies: No Known Allergies  Metabolic Disorder Labs: No results found for: HGBA1C, MPG No results found for: PROLACTIN No results found for: CHOL, TRIG, HDL, CHOLHDL, VLDL, LDLCALC   Current Medications: Current Outpatient Prescriptions  Medication Sig Dispense Refill  . amLODipine (NORVASC) 5 MG tablet Take 5 mg by mouth.    . hydrochlorothiazide (HYDRODIURIL) 25 MG tablet Take 25 mg by mouth.    . hydrOXYzine (ATARAX/VISTARIL) 25 MG tablet Take 1 tablet (25 mg total) by mouth daily as needed. 30 tablet 1  . levothyroxine (SYNTHROID, LEVOTHROID) 150 MCG tablet Take 1 tablet (150 mcg total) by mouth daily before breakfast. 30 tablet 0  . olmesartan (BENICAR) 40 MG tablet Take 40 mg by mouth daily.    . sertraline  (ZOLOFT) 100 MG tablet Take 1 tablet (100 mg total) by mouth daily. 30 tablet 1  . traZODone (DESYREL) 50 MG tablet Take 1 tablet (50 mg total) by mouth at bedtime as needed for sleep. 30 tablet 1   No current facility-administered medications for this visit.    Facility-Administered Medications Ordered in Other Visits  Medication Dose Route Frequency Provider Last Rate Last Dose  . gadopentetate dimeglumine (MAGNEVIST) injection 15 mL  15 mL Intravenous Once PRN Britt Bottom, MD        Neurologic: Headache: No Seizure: No Paresthesias: No  Musculoskeletal: Strength & Muscle Tone: within normal limits Gait & Station: normal Patient leans: N/A  Psychiatric Specialty Exam: Review of Systems  Neurological: Negative for dizziness, tremors, seizures and headaches.  Psychiatric/Behavioral: Negative for depression, hallucinations, substance abuse and suicidal ideas. The patient is nervous/anxious. The patient does not have insomnia.   All other systems reviewed and are negative.   Blood pressure (!) 166/91, pulse 73, height 5' 8.5" (1.74 m), weight 167 lb 6.4 oz (75.9 kg), SpO2 95 %.Body mass index is 25.08 kg/m.  General Appearance: Casual  Eye Contact:  Good  Speech:  Clear and Coherent  Volume:  Normal  Mood:  "fine'  Affect:  Restricted  Thought Process:  Coherent and Goal Directed  Orientation:  Full (Time, Place, and Person)  Thought Content: Logical Perceptions: denies AH/VH  Suicidal Thoughts:  No  Homicidal Thoughts:  No  Memory:  Immediate;   Good Recent;   Good Remote;   Good  Judgement:  Good  Insight:  Good  Psychomotor Activity:  Normal  Concentration:  Concentration: Good and Attention Span: Good  Recall:  Good  Fund of Knowledge: Good  Language: Good  Akathisia:  NA  Handed:  Right  AIMS (if indicated):  N/A  Assets:  Communication Skills Desire for Improvement  ADL's:  Intact  Cognition: WNL  Sleep:  fair   Assessment [name] is a 56  year old male with alcohol use disorder, hypothyroidism, hypertension, sleep apnea, unspecified convulsion who present for follow up for his alcohol use and anxiety. He has recent admission to [facility] on 10/9-05/2016, originally brought to ED by sheriff for agitation in the setting of alcohol use.   # Adjustment disorder with anxiety and depressed mood # r/o GAD # r/o MDD He reports improvement in his mood, which correlates to him starting a job. Will continue sertraline at the current dose. Discussed behavioral activation and sleep hygiene for his insomnia.    # Alcohol use disorder in early remission Patient is motivated for sobriety and has been abstinent for 7 months. Patient goes to [facility] [employer]  every week with his wife. He is not interested in pharmacological treatment. Continue motivational interview.   Plan 1. Continue sertraline 100 mg daily 2. Continue hydroxyzine 25 mg daily as needed for anxiety 3. Continue Trazodone 50 mg at night as needed for sleep 4. Return to clinic in one month  The patient demonstrates the following risk factors for suicide: Chronic risk factors for suicide include: psychiatric disorder of anxiety and substance use disorder. Acute risk factors for suicide include: unemployment and recent discharge from inpatient psychiatry. Protective factors for this patient include: hope for the future. Considering these factors, the overall suicide risk at this point appears to be low. Patient is appropriate for outpatient follow up. Patient does have a gun access which is safely secured.  Treatment Plan Summary: Plan as above  Norman Clay, MD 11/16/2016, 10:16 AM

## 2016-11-16 NOTE — Patient Instructions (Addendum)
1. Continue sertraline 100 mg daily 2. Continue hydroxyzine 25 mg daily as needed for anxiety 3. Continue Trazodone 50 mg at night as needed for sleep 4. Return to clinic in one month

## 2017-01-10 NOTE — Progress Notes (Signed)
[facility] MD/PA/NP OP Progress Note  01/16/2017 11:49 AM [name]  MRN:  [id number]  Chief Complaint:  Chief Complaint    Follow-up     Subjective:  "I feel pretty normal" HPI:  Patient presents for follow up appointment. He endorses insomnia due to pain on his right him and hand numbness. He states that he was discontinued sertraline by his PCP, Ms. Deatra Ina for the last three months with concern for hypertension. He states that he was not even taking it at the last visit in April. Although he feels stressed at times at work, he is able to cope with it better. He reports good relationship with his wife at home and not react on stresses. He has a daughter, age 67 year old who closed the contact with him for unknown reason. He drank three liquors last Saturday, when he was celebrating his brother's birthday. He thought he would like to drink socially and does not see any problems with it. He denies craving for alcohol. He occasionally feels anxious but denies panic attacks. He discontinued trazodone as his wife is concerned that it cause him seizure in the past.   Visit Diagnosis:    ICD-10-CM   1. Adjustment disorder with mixed anxiety and depressed mood F43.23   2. Alcohol use disorder, moderate, in early remission ([place]) F10.21     Past Psychiatric History:  Outpatient: denies Psychiatry admission: [facility] in 10/9-10/05/2016 for agitation in the setting of alcohol use Previous suicide attempt: denies  Past trials of medication: Zoloft, Xanax Substance use: started drinking at age 16 with his father who abuses alcohol. Longest sobriety for 12 years without formal treatment. He goes to [employer] weekly.  Past Medical History:  Past Medical History:  Diagnosis Date  . Anxiety   . Anxiety and depression   . Depression   . Hemorrhoids   . Hypertension   . Hypothyroidism   . Seizures ([place]) 02/2016    Past Surgical History:  Procedure Laterality Date  . BICEPS TENDON REPAIR Right   .  CARPAL TUNNEL RELEASE Bilateral   . KNEE CARTILAGE SURGERY Left   . KNEE SURGERY Right    x 2    Family Psychiatric History:  Father- alcohol use, denies suicide attempt  Family History:  Family History  Problem Relation Age of Onset  . Hypertension Father   . Kidney disease Mother   . Breast cancer Paternal Grandmother   . Kidney failure Maternal Grandmother   . Multiple sclerosis Sister   . Heart attack Brother        x 3  . Heart defect Maternal Uncle     Social History:  Social History   Social History  . Marital status: Married    Spouse name: [name]  . Number of children: 1  . Years of education: 69   Occupational History  . construction     remodels houses   Social History Main Topics  . Smoking status: Current Every Day Smoker    Packs/day: 1.50    Years: 20.00    Types: Cigarettes  . Smokeless tobacco: Never Used  . Alcohol use 13.2 oz/week    8 Shots of liquor, 14 Standard drinks or equivalent per week     Comment: 8 mixed drinks twice weekly, 06-20-2016 per pt no but stooped 3 months ago, 10-13-2016 per pt not anymore Per pt its been about 3-4 months ago.  . Drug use: Yes    Types: Marijuana  Comment: last use thc 2 months ago, 06-20-2016 per pt Marijuana . 10-13-2016 per pt not anymore her stopped 10-12 years apg.  . Sexual activity: Not Currently    Birth control/ protection: None   Other Topics Concern  . Not on file   Social History Narrative   Lives with wife   Caffeine- coffee 2 cups daily    Allergies: No Known Allergies  Metabolic Disorder Labs: No results found for: HGBA1C, MPG No results found for: PROLACTIN No results found for: CHOL, TRIG, HDL, CHOLHDL, VLDL, LDLCALC   Current Medications: Current Outpatient Prescriptions  Medication Sig Dispense Refill  . hydrochlorothiazide (HYDRODIURIL) 25 MG tablet Take 25 mg by mouth.    . hydrOXYzine (ATARAX/VISTARIL) 25 MG tablet Take 1 tablet (25 mg total) by mouth daily as needed. 30  tablet 1  . levothyroxine (SYNTHROID, LEVOTHROID) 150 MCG tablet Take 1 tablet (150 mcg total) by mouth daily before breakfast. 30 tablet 0  . olmesartan (BENICAR) 40 MG tablet Take 40 mg by mouth daily.    Marland Kitchen amLODipine (NORVASC) 5 MG tablet Take 5 mg by mouth.    . sertraline (ZOLOFT) 100 MG tablet Take 1 tablet (100 mg total) by mouth daily. (Patient not taking: Reported on 01/16/2017) 30 tablet 1   No current facility-administered medications for this visit.    Facility-Administered Medications Ordered in Other Visits  Medication Dose Route Frequency Provider Last Rate Last Dose  . gadopentetate dimeglumine (MAGNEVIST) injection 15 mL  15 mL Intravenous Once PRN Sater, Nanine Means, MD        Neurologic: Headache: No Seizure: No Paresthesias: No  Musculoskeletal: Strength & Muscle Tone: within normal limits Gait & Station: normal Patient leans: N/A  Psychiatric Specialty Exam: Review of Systems  Neurological: Negative for dizziness, tremors, seizures and headaches.  Psychiatric/Behavioral: Negative for depression, hallucinations, substance abuse and suicidal ideas. The patient is nervous/anxious and has insomnia.   All other systems reviewed and are negative.   Blood pressure 110/70, pulse 72, height 5\' 7"  (1.702 m), weight 166 lb (75.3 kg), SpO2 95 %.Body mass index is 26 kg/m.  General Appearance: Casual  Eye Contact:  Good  Speech:  Clear and Coherent  Volume:  Normal  Mood:  "better"  Affect:  Restricted  Thought Process:  Coherent and Goal Directed  Orientation:  Full (Time, Place, and Person)  Thought Content: Logical Perceptions: denies AH/VH  Suicidal Thoughts:  No  Homicidal Thoughts:  No  Memory:  Immediate;   Good Recent;   Good Remote;   Good  Judgement:  Good  Insight:  Shallow  Psychomotor Activity:  Normal  Concentration:  Concentration: Good and Attention Span: Good  Recall:  Good  Fund of Knowledge: Good  Language: Good  Akathisia:  NA  Handed:   Right  AIMS (if indicated):  N/A  Assets:  Communication Skills Desire for Improvement  ADL's:  Intact  Cognition: WNL  Sleep:  poor   Assessment Jakeim E Pellot is a 56 year old male with alcohol use disorder, hypothyroidism, hypertension, sleep apnea, unspecified convulsion who present for follow up for his alcohol use and anxiety. He has recent admission to [facility] on 10/9-05/2016, originally brought to ED by sheriff for agitation in the setting of alcohol use.   # Adjustment disorder with anxiety and depressed mood # r/o GAD # r/o MDD Patient reports improvement in his mood symptoms despite being discontinued sertraline a couple of months ago by is PCP with concern for hypertension per  report. Although it is not a common side effect to cause hypertension compared with SNRI, will not restart this medication at this time given he denies significant mood symptoms. Will discontinue trazodone, hydroxyzine prn given he has not taken it. Discussed behavioral activation and sleep hygiene for his insomnia.    # Alcohol use disorder  Patient relapsed on one day alcohol use last weekend. He has limited insight into his use, although he is motivated for sobriety otherwise. He is not interested in pharmacological treatment, although he continues to al anon meeting every week with his wife and his mother.  Spent significant amount of time providing motivational interview.   Plan He will be followed up by his PCP- he is advised to return to this clinic as needed (sertraline was discontinued by his PCP)  The patient demonstrates the following risk factors for suicide: Chronic risk factors for suicide include: psychiatric disorder of anxiety and substance use disorder. Acute risk factors for suicide include: unemployment and recent discharge from inpatient psychiatry. Protective factors for this patient include: hope for the future. Considering these factors, the overall suicide risk at this point appears to  be low. Patient is appropriate for outpatient follow up. Patient does have a gun access which is safely secured.  Treatment Plan Summary: Plan as above  The duration of this appointment visit was 30 minutes of face-to-face time with the patient.  Greater than 50% of this time was spent in counseling, explanation of  diagnosis, planning of further management, and coordination of care.  Norman Clay, MD 01/16/2017, 11:49 AM

## 2017-01-16 ENCOUNTER — Ambulatory Visit (INDEPENDENT_AMBULATORY_CARE_PROVIDER_SITE_OTHER): Payer: BLUE CROSS/BLUE SHIELD | Admitting: Psychiatry

## 2017-01-16 VITALS — BP 110/70 | HR 72 | Ht 67.0 in | Wt 166.0 lb

## 2017-01-16 DIAGNOSIS — F4323 Adjustment disorder with mixed anxiety and depressed mood: Secondary | ICD-10-CM | POA: Diagnosis not present

## 2017-01-16 DIAGNOSIS — I10 Essential (primary) hypertension: Secondary | ICD-10-CM

## 2017-01-16 DIAGNOSIS — G4730 Sleep apnea, unspecified: Secondary | ICD-10-CM

## 2017-01-16 DIAGNOSIS — R569 Unspecified convulsions: Secondary | ICD-10-CM | POA: Diagnosis not present

## 2017-01-16 DIAGNOSIS — E039 Hypothyroidism, unspecified: Secondary | ICD-10-CM | POA: Diagnosis not present

## 2017-01-16 DIAGNOSIS — F1021 Alcohol dependence, in remission: Secondary | ICD-10-CM

## 2017-01-16 DIAGNOSIS — F17210 Nicotine dependence, cigarettes, uncomplicated: Secondary | ICD-10-CM

## 2017-01-16 DIAGNOSIS — F1099 Alcohol use, unspecified with unspecified alcohol-induced disorder: Secondary | ICD-10-CM | POA: Diagnosis not present

## 2017-01-16 DIAGNOSIS — Z79899 Other long term (current) drug therapy: Secondary | ICD-10-CM

## 2017-01-16 DIAGNOSIS — Z811 Family history of alcohol abuse and dependence: Secondary | ICD-10-CM | POA: Diagnosis not present

## 2017-01-16 NOTE — Patient Instructions (Addendum)
1. Return to clinic as needed

## 2017-02-02 ENCOUNTER — Telehealth: Payer: Self-pay

## 2017-02-02 NOTE — Telephone Encounter (Signed)
From PCP. He agreed medication to be filled by his PCP at the last visit.

## 2017-02-02 NOTE — Telephone Encounter (Signed)
received a fax request for a refill on hydroxyzine 25mg  pt was last seen on 01-16-17 no follow up appt in the system.

## 2017-02-05 NOTE — Telephone Encounter (Signed)
He will be followed up by his PCP and will not return to this clinic. Please direct the pharmacy to contact his PCP.

## 2017-02-05 NOTE — Telephone Encounter (Signed)
received a request for hydroxyzine hcl 25mg  pt was last seen on  01-16-17. refill was not given at that appt. no follow up visit was made.

## 2017-02-05 NOTE — Telephone Encounter (Signed)
form faxed back to pharmacy stating that pt is too follow up with pcp and to please direct any refills to pcp

## 2017-03-14 ENCOUNTER — Telehealth: Payer: Self-pay | Admitting: *Deleted

## 2017-03-14 NOTE — Telephone Encounter (Signed)
Received fax notification from [facility]) that CPAP device approved. Authorization number: [id number]. Dates of service: 03/14/17.  Phone: [phone] for any questions.

## 2018-11-03 ENCOUNTER — Encounter (HOSPITAL_COMMUNITY)
Admission: EM | Disposition: A | Discharge status: Other Acute Inpatient Hospital | Payer: Self-pay | Source: Home / Self Care

## 2018-11-03 ENCOUNTER — Inpatient Hospital Stay (HOSPITAL_COMMUNITY)
Admission: EM | Admit: 2018-11-03 | Discharge: 2018-12-09 | DRG: 163 | Disposition: A | Discharge status: Other Acute Inpatient Hospital | Payer: Medicaid Other | Attending: Surgery | Admitting: Surgery

## 2018-11-03 ENCOUNTER — Encounter (HOSPITAL_COMMUNITY): Payer: Self-pay | Admitting: Emergency Medicine

## 2018-11-03 ENCOUNTER — Emergency Department (HOSPITAL_COMMUNITY): Payer: Medicaid Other

## 2018-11-03 ENCOUNTER — Emergency Department (HOSPITAL_COMMUNITY): Payer: Medicaid Other | Admitting: Certified Registered Nurse Anesthetist

## 2018-11-03 DIAGNOSIS — F17210 Nicotine dependence, cigarettes, uncomplicated: Secondary | ICD-10-CM | POA: Diagnosis present

## 2018-11-03 DIAGNOSIS — B9689 Other specified bacterial agents as the cause of diseases classified elsewhere: Secondary | ICD-10-CM | POA: Diagnosis not present

## 2018-11-03 DIAGNOSIS — T797XXA Traumatic subcutaneous emphysema, initial encounter: Secondary | ICD-10-CM

## 2018-11-03 DIAGNOSIS — J942 Hemothorax: Secondary | ICD-10-CM

## 2018-11-03 DIAGNOSIS — F1010 Alcohol abuse, uncomplicated: Secondary | ICD-10-CM | POA: Diagnosis present

## 2018-11-03 DIAGNOSIS — G4733 Obstructive sleep apnea (adult) (pediatric): Secondary | ICD-10-CM | POA: Diagnosis present

## 2018-11-03 DIAGNOSIS — Z9689 Presence of other specified functional implants: Secondary | ICD-10-CM

## 2018-11-03 DIAGNOSIS — R52 Pain, unspecified: Secondary | ICD-10-CM

## 2018-11-03 DIAGNOSIS — W3400XA Accidental discharge from unspecified firearms or gun, initial encounter: Secondary | ICD-10-CM

## 2018-11-03 DIAGNOSIS — R042 Hemoptysis: Secondary | ICD-10-CM

## 2018-11-03 DIAGNOSIS — Y249XXA Unspecified firearm discharge, undetermined intent, initial encounter: Secondary | ICD-10-CM

## 2018-11-03 DIAGNOSIS — I2510 Atherosclerotic heart disease of native coronary artery without angina pectoris: Secondary | ICD-10-CM | POA: Diagnosis present

## 2018-11-03 DIAGNOSIS — Z79890 Hormone replacement therapy (postmenopausal): Secondary | ICD-10-CM

## 2018-11-03 DIAGNOSIS — Y838 Other surgical procedures as the cause of abnormal reaction of the patient, or of later complication, without mention of misadventure at the time of the procedure: Secondary | ICD-10-CM | POA: Diagnosis not present

## 2018-11-03 DIAGNOSIS — S272XXA Traumatic hemopneumothorax, initial encounter: Principal | ICD-10-CM | POA: Diagnosis present

## 2018-11-03 DIAGNOSIS — S21132S Puncture wound without foreign body of left front wall of thorax without penetration into thoracic cavity, sequela: Secondary | ICD-10-CM

## 2018-11-03 DIAGNOSIS — T1491XA Suicide attempt, initial encounter: Secondary | ICD-10-CM

## 2018-11-03 DIAGNOSIS — Z751 Person awaiting admission to adequate facility elsewhere: Secondary | ICD-10-CM

## 2018-11-03 DIAGNOSIS — R0602 Shortness of breath: Secondary | ICD-10-CM

## 2018-11-03 DIAGNOSIS — G4700 Insomnia, unspecified: Secondary | ICD-10-CM | POA: Diagnosis present

## 2018-11-03 DIAGNOSIS — S21332A Puncture wound without foreign body of left front wall of thorax with penetration into thoracic cavity, initial encounter: Secondary | ICD-10-CM | POA: Diagnosis present

## 2018-11-03 DIAGNOSIS — E039 Hypothyroidism, unspecified: Secondary | ICD-10-CM | POA: Diagnosis present

## 2018-11-03 DIAGNOSIS — J939 Pneumothorax, unspecified: Secondary | ICD-10-CM

## 2018-11-03 DIAGNOSIS — I10 Essential (primary) hypertension: Secondary | ICD-10-CM | POA: Diagnosis present

## 2018-11-03 DIAGNOSIS — S27331A Laceration of lung, unilateral, initial encounter: Secondary | ICD-10-CM | POA: Diagnosis present

## 2018-11-03 DIAGNOSIS — J869 Pyothorax without fistula: Secondary | ICD-10-CM | POA: Diagnosis not present

## 2018-11-03 DIAGNOSIS — Z653 Problems related to other legal circumstances: Secondary | ICD-10-CM

## 2018-11-03 DIAGNOSIS — Z635 Disruption of family by separation and divorce: Secondary | ICD-10-CM

## 2018-11-03 DIAGNOSIS — F419 Anxiety disorder, unspecified: Secondary | ICD-10-CM | POA: Diagnosis present

## 2018-11-03 DIAGNOSIS — K658 Other peritonitis: Secondary | ICD-10-CM | POA: Diagnosis present

## 2018-11-03 DIAGNOSIS — Z598 Other problems related to housing and economic circumstances: Secondary | ICD-10-CM

## 2018-11-03 DIAGNOSIS — M25511 Pain in right shoulder: Secondary | ICD-10-CM | POA: Diagnosis not present

## 2018-11-03 DIAGNOSIS — Z419 Encounter for procedure for purposes other than remedying health state, unspecified: Secondary | ICD-10-CM

## 2018-11-03 DIAGNOSIS — T8182XA Emphysema (subcutaneous) resulting from a procedure, initial encounter: Secondary | ICD-10-CM | POA: Diagnosis not present

## 2018-11-03 DIAGNOSIS — X72XXXA Intentional self-harm by handgun discharge, initial encounter: Secondary | ICD-10-CM

## 2018-11-03 DIAGNOSIS — F329 Major depressive disorder, single episode, unspecified: Secondary | ICD-10-CM | POA: Diagnosis present

## 2018-11-03 DIAGNOSIS — Z9889 Other specified postprocedural states: Secondary | ICD-10-CM

## 2018-11-03 DIAGNOSIS — Z4682 Encounter for fitting and adjustment of non-vascular catheter: Secondary | ICD-10-CM

## 2018-11-03 DIAGNOSIS — D62 Acute posthemorrhagic anemia: Secondary | ICD-10-CM | POA: Diagnosis present

## 2018-11-03 DIAGNOSIS — K567 Ileus, unspecified: Secondary | ICD-10-CM | POA: Diagnosis not present

## 2018-11-03 DIAGNOSIS — R609 Edema, unspecified: Secondary | ICD-10-CM

## 2018-11-03 DIAGNOSIS — Z79899 Other long term (current) drug therapy: Secondary | ICD-10-CM

## 2018-11-03 DIAGNOSIS — Y92230 Patient room in hospital as the place of occurrence of the external cause: Secondary | ICD-10-CM | POA: Diagnosis not present

## 2018-11-03 HISTORY — PX: LAPAROTOMY: SHX154

## 2018-11-03 LAB — COMPREHENSIVE METABOLIC PANEL
ALT: 10 U/L (ref 0–44)
AST: 28 U/L (ref 15–41)
Albumin: 3.4 g/dL — ABNORMAL LOW (ref 3.5–5.0)
Alkaline Phosphatase: 48 U/L (ref 38–126)
Anion gap: 7 (ref 5–15)
BUN: 13 mg/dL (ref 6–20)
CHLORIDE: 111 mmol/L (ref 98–111)
CO2: 19 mmol/L — ABNORMAL LOW (ref 22–32)
Calcium: 7.7 mg/dL — ABNORMAL LOW (ref 8.9–10.3)
Creatinine, Ser: 1.59 mg/dL — ABNORMAL HIGH (ref 0.61–1.24)
GFR calc Af Amer: 55 mL/min — ABNORMAL LOW (ref >60)
GFR calc non Af Amer: 47 mL/min — ABNORMAL LOW (ref >60)
Glucose, Bld: 169 mg/dL — ABNORMAL HIGH (ref 70–99)
Potassium: 4.3 mmol/L (ref 3.5–5.1)
Sodium: 137 mmol/L (ref 135–145)
Total Bilirubin: 1.3 mg/dL — ABNORMAL HIGH (ref 0.3–1.2)
Total Protein: 5.1 g/dL — ABNORMAL LOW (ref 6.5–8.1)

## 2018-11-03 LAB — CBC
HCT: 32.1 % — ABNORMAL LOW (ref 39.0–52.0)
HEMATOCRIT: 30.3 % — AB (ref 39.0–52.0)
Hemoglobin: 10.5 g/dL — ABNORMAL LOW (ref 13.0–17.0)
Hemoglobin: 10.7 g/dL — ABNORMAL LOW (ref 13.0–17.0)
MCH: 34.1 pg — AB (ref 26.0–34.0)
MCH: 33.4 pg (ref 26.0–34.0)
MCHC: 34.7 g/dL (ref 30.0–36.0)
MCHC: 33.3 g/dL (ref 30.0–36.0)
MCV: 98.4 fL (ref 80.0–100.0)
MCV: 100.3 fL — ABNORMAL HIGH (ref 80.0–100.0)
PLATELETS: 152 10*3/uL (ref 150–400)
Platelets: 136 10*3/uL — ABNORMAL LOW (ref 150–400)
RBC: 3.08 MIL/uL — ABNORMAL LOW (ref 4.22–5.81)
RBC: 3.2 MIL/uL — ABNORMAL LOW (ref 4.22–5.81)
RDW: 13.4 % (ref 11.5–15.5)
RDW: 13.6 % (ref 11.5–15.5)
WBC: 22 10*3/uL — ABNORMAL HIGH (ref 4.0–10.5)
WBC: 22.3 10*3/uL — AB (ref 4.0–10.5)
nRBC: 0 % (ref 0.0–0.2)
nRBC: 0 % (ref 0.0–0.2)

## 2018-11-03 LAB — CREATININE, SERUM
Creatinine, Ser: 1.62 mg/dL — ABNORMAL HIGH (ref 0.61–1.24)
GFR calc Af Amer: 53 mL/min — ABNORMAL LOW (ref >60)
GFR calc non Af Amer: 46 mL/min — ABNORMAL LOW (ref >60)

## 2018-11-03 LAB — PREPARE FRESH FROZEN PLASMA
Unit division: 0
Unit division: 0

## 2018-11-03 LAB — ABO/RH: ABO/RH(D): A POS

## 2018-11-03 LAB — POCT I-STAT 7, (LYTES, BLD GAS, ICA,H+H)
Acid-base deficit: 9 mmol/L — ABNORMAL HIGH (ref 0.0–2.0)
Bicarbonate: 18.3 mmol/L — ABNORMAL LOW (ref 20.0–28.0)
Calcium, Ion: 1.07 mmol/L — ABNORMAL LOW (ref 1.15–1.40)
HCT: 29 % — ABNORMAL LOW (ref 39.0–52.0)
Hemoglobin: 9.9 g/dL — ABNORMAL LOW (ref 13.0–17.0)
O2 Saturation: 100 %
Potassium: 3.4 mmol/L — ABNORMAL LOW (ref 3.5–5.1)
SODIUM: 139 mmol/L (ref 135–145)
TCO2: 20 mmol/L — ABNORMAL LOW (ref 22–32)
pCO2 arterial: 43.8 mmHg (ref 32.0–48.0)
pH, Arterial: 7.229 — ABNORMAL LOW (ref 7.350–7.450)
pO2, Arterial: 474 mmHg — ABNORMAL HIGH (ref 83.0–108.0)

## 2018-11-03 LAB — BPAM FFP
Blood Product Expiration Date: 202004052359
Blood Product Expiration Date: 202004112359
ISSUE DATE / TIME: 202003291710
ISSUE DATE / TIME: 202003291710
Unit Type and Rh: 600
Unit Type and Rh: 6200

## 2018-11-03 LAB — ETHANOL: Alcohol, Ethyl (B): <10 mg/dL (ref <10)

## 2018-11-03 LAB — PROTIME-INR
INR: 1.3 — ABNORMAL HIGH (ref 0.8–1.2)
Prothrombin Time: 16.3 seconds — ABNORMAL HIGH (ref 11.4–15.2)

## 2018-11-03 LAB — CDS SEROLOGY

## 2018-11-03 LAB — LACTIC ACID, PLASMA: Lactic Acid, Venous: 3.6 mmol/L (ref 0.5–1.9)
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
## 2018-11-03 SURGERY — LAPAROTOMY, EXPLORATORY
Anesthesia: General | Site: Abdomen

## 2018-11-03 MED ORDER — ONDANSETRON HCL 4 MG/2ML IJ SOLN: 4.0000 mg | Freq: Four times a day (QID) | INTRAMUSCULAR | Status: DC | PRN

## 2018-11-03 MED ORDER — PROPOFOL 10 MG/ML IV BOLUS
INTRAVENOUS | Status: AC
  Filled 2018-11-03: qty 20

## 2018-11-03 MED ORDER — FENTANYL 40 MCG/ML IV SOLN
INTRAVENOUS | Status: DC
  Administered 2018-11-03: 19:00:00 via INTRAVENOUS
  Administered 2018-11-04: 40 ug via INTRAVENOUS
  Administered 2018-11-04 (×2): 45 ug via INTRAVENOUS
  Administered 2018-11-04: 15 ug via INTRAVENOUS
  Administered 2018-11-04: 60 ug via INTRAVENOUS
  Administered 2018-11-05 (×2): 75 ug via INTRAVENOUS
  Administered 2018-11-05: 60 ug via INTRAVENOUS
  Administered 2018-11-05: 12:00:00 via INTRAVENOUS
  Administered 2018-11-05: 105 ug via INTRAVENOUS
  Administered 2018-11-05: 40 ug via INTRAVENOUS
  Administered 2018-11-05: 120 ug via INTRAVENOUS
  Administered 2018-11-06: 40 ug via INTRAVENOUS
  Filled 2018-11-03: qty 25
  Filled 2018-11-03: qty 1000

## 2018-11-03 MED ORDER — ROCURONIUM BROMIDE 10 MG/ML (PF) SYRINGE
PREFILLED_SYRINGE | INTRAVENOUS | Status: DC | PRN
  Administered 2018-11-03: 30 mg via INTRAVENOUS

## 2018-11-03 MED ORDER — METHOCARBAMOL 500 MG PO TABS: 500.0000 mg | ORAL_TABLET | Freq: Four times a day (QID) | ORAL | Status: DC | PRN

## 2018-11-03 MED ORDER — ALBUMIN HUMAN 5 % IV SOLN
INTRAVENOUS | Status: DC | PRN
  Administered 2018-11-03 (×2): via INTRAVENOUS

## 2018-11-03 MED ORDER — SUCCINYLCHOLINE CHLORIDE 200 MG/10ML IV SOSY
PREFILLED_SYRINGE | INTRAVENOUS | Status: DC | PRN
  Administered 2018-11-03: 100 mg via INTRAVENOUS

## 2018-11-03 MED ORDER — 0.9 % SODIUM CHLORIDE (POUR BTL) OPTIME
TOPICAL | Status: DC | PRN
  Administered 2018-11-03: 3000 mL

## 2018-11-03 MED ORDER — PHENOL 1.4 % MT LIQD
1.0000 | OROMUCOSAL | Status: DC | PRN
  Filled 2018-11-03: qty 177

## 2018-11-03 MED ORDER — PROPOFOL 10 MG/ML IV BOLUS
INTRAVENOUS | Status: DC | PRN
  Administered 2018-11-03: 120 mg via INTRAVENOUS

## 2018-11-03 MED ORDER — FENTANYL CITRATE (PF) 100 MCG/2ML IJ SOLN
INTRAMUSCULAR | Status: AC
  Administered 2018-11-03: 50 ug via INTRAVENOUS
  Filled 2018-11-03: qty 2

## 2018-11-03 MED ORDER — DIPHENHYDRAMINE HCL 50 MG/ML IJ SOLN: 12.5000 mg | Freq: Four times a day (QID) | INTRAMUSCULAR | Status: DC | PRN

## 2018-11-03 MED ORDER — DEXAMETHASONE SODIUM PHOSPHATE 10 MG/ML IJ SOLN
INTRAMUSCULAR | Status: DC | PRN
  Administered 2018-11-03: 10 mg via INTRAVENOUS

## 2018-11-03 MED ORDER — FENTANYL CITRATE (PF) 100 MCG/2ML IJ SOLN
25.0000 ug | INTRAMUSCULAR | Status: DC | PRN
  Administered 2018-11-03 (×2): 50 ug via INTRAVENOUS

## 2018-11-03 MED ORDER — ENOXAPARIN SODIUM 40 MG/0.4ML ~~LOC~~ SOLN
40.0000 mg | SUBCUTANEOUS | Status: DC
  Administered 2018-11-04: 40 mg via SUBCUTANEOUS
  Filled 2018-11-03 (×2): qty 0.4

## 2018-11-03 MED ORDER — FENTANYL CITRATE (PF) 250 MCG/5ML IJ SOLN
INTRAMUSCULAR | Status: DC | PRN
  Administered 2018-11-03 (×2): 50 ug via INTRAVENOUS

## 2018-11-03 MED ORDER — KETOROLAC TROMETHAMINE 30 MG/ML IJ SOLN
30.0000 mg | Freq: Four times a day (QID) | INTRAMUSCULAR | Status: AC
  Administered 2018-11-03: 30 mg via INTRAVENOUS
  Filled 2018-11-03: qty 1

## 2018-11-03 MED ORDER — SODIUM CHLORIDE 0.9% FLUSH: 9.0000 mL | INTRAVENOUS | Status: DC | PRN

## 2018-11-03 MED ORDER — HYDRALAZINE HCL 20 MG/ML IJ SOLN: 10.0000 mg | INTRAMUSCULAR | Status: DC | PRN

## 2018-11-03 MED ORDER — KCL IN DEXTROSE-NACL 20-5-0.9 MEQ/L-%-% IV SOLN
INTRAVENOUS | Status: DC
  Administered 2018-11-03 – 2018-11-11 (×11): via INTRAVENOUS
  Filled 2018-11-03 (×15): qty 1000

## 2018-11-03 MED ORDER — KETOROLAC TROMETHAMINE 30 MG/ML IJ SOLN: 30.0000 mg | Freq: Four times a day (QID) | INTRAMUSCULAR | Status: DC | PRN

## 2018-11-03 MED ORDER — PROMETHAZINE HCL 25 MG/ML IJ SOLN: 6.2500 mg | INTRAMUSCULAR | Status: DC | PRN

## 2018-11-03 MED ORDER — SODIUM CHLORIDE 0.9 % IV SOLN
INTRAVENOUS | Status: DC | PRN
  Administered 2018-11-03: 18:00:00 via INTRAVENOUS

## 2018-11-03 MED ORDER — PHENYLEPHRINE HCL 10 MG/ML IJ SOLN
INTRAMUSCULAR | Status: DC | PRN
  Administered 2018-11-03: 80 ug via INTRAVENOUS

## 2018-11-03 MED ORDER — SODIUM CHLORIDE 0.9 % IV SOLN
INTRAVENOUS | Status: DC | PRN
  Administered 2018-11-03: 50 ug/min via INTRAVENOUS

## 2018-11-03 MED ORDER — SODIUM CHLORIDE 0.9 % IV SOLN
2.0000 g | INTRAVENOUS | Status: DC
  Filled 2018-11-03: qty 2

## 2018-11-03 MED ORDER — LACTATED RINGERS IV SOLN
INTRAVENOUS | Status: DC | PRN
  Administered 2018-11-03: 18:00:00 via INTRAVENOUS

## 2018-11-03 MED ORDER — ONDANSETRON 4 MG PO TBDP: 4.0000 mg | ORAL_TABLET | Freq: Four times a day (QID) | ORAL | Status: DC | PRN

## 2018-11-03 MED ORDER — NALOXONE HCL 0.4 MG/ML IJ SOLN: 0.4000 mg | INTRAMUSCULAR | Status: DC | PRN

## 2018-11-03 MED ORDER — DEXTROSE 5 % IV SOLN
INTRAVENOUS | Status: DC | PRN
  Administered 2018-11-03: 2 g via INTRAVENOUS

## 2018-11-03 MED ORDER — FENTANYL CITRATE (PF) 250 MCG/5ML IJ SOLN
INTRAMUSCULAR | Status: AC
  Filled 2018-11-03: qty 5

## 2018-11-03 MED ORDER — SUGAMMADEX SODIUM 200 MG/2ML IV SOLN
INTRAVENOUS | Status: DC | PRN
  Administered 2018-11-03: 200 mg via INTRAVENOUS

## 2018-11-03 MED ORDER — ONDANSETRON HCL 4 MG/2ML IJ SOLN
INTRAMUSCULAR | Status: DC | PRN
  Administered 2018-11-03: 4 mg via INTRAVENOUS

## 2018-11-03 MED ORDER — DIPHENHYDRAMINE HCL 12.5 MG/5ML PO ELIX: 12.5000 mg | ORAL_SOLUTION | Freq: Four times a day (QID) | ORAL | Status: DC | PRN

## 2018-11-03 MED ORDER — LIDOCAINE 2% (20 MG/ML) 5 ML SYRINGE
INTRAMUSCULAR | Status: DC | PRN
  Administered 2018-11-03: 1100 mg via INTRAVENOUS

## 2018-11-03 MED ORDER — ACETAMINOPHEN 500 MG PO TABS
1000.0000 mg | ORAL_TABLET | Freq: Four times a day (QID) | ORAL | Status: DC
  Administered 2018-11-04 – 2018-11-05 (×5): 1000 mg via ORAL
  Filled 2018-11-03 (×5): qty 2

## 2018-11-03 SURGICAL SUPPLY — 49 items
BLADE CLIPPER SURG (BLADE) IMPLANT
CANISTER SUCT 3000ML PPV (MISCELLANEOUS) ×3 IMPLANT
CATH THORACIC 28FR (CATHETERS) ×2 IMPLANT
CHLORAPREP W/TINT 26ML (MISCELLANEOUS) ×3 IMPLANT
COVER SURGICAL LIGHT HANDLE (MISCELLANEOUS) ×3 IMPLANT
COVER WAND RF STERILE (DRAPES) ×1 IMPLANT
DRAPE LAPAROSCOPIC ABDOMINAL (DRAPES) ×3 IMPLANT
DRAPE WARM FLUID 44X44 (DRAPE) ×3 IMPLANT
DRSG EMULSION OIL 3X3 NADH (GAUZE/BANDAGES/DRESSINGS) ×2 IMPLANT
DRSG OPSITE POSTOP 4X10 (GAUZE/BANDAGES/DRESSINGS) ×2 IMPLANT
ELECT BLADE 4.0 EZ CLEAN MEGAD (MISCELLANEOUS) ×3
ELECT BLADE 6.5 EXT (BLADE) IMPLANT
ELECT CAUTERY BLADE 6.4 (BLADE) ×3 IMPLANT
ELECT REM PT RETURN 9FT ADLT (ELECTROSURGICAL) ×3
ELECTRODE BLDE 4.0 EZ CLN MEGD (MISCELLANEOUS) IMPLANT
ELECTRODE REM PT RTRN 9FT ADLT (ELECTROSURGICAL) ×1 IMPLANT
GAUZE SPONGE 4X4 12PLY STRL (GAUZE/BANDAGES/DRESSINGS) ×2 IMPLANT
GLOVE BIO SURGEON STRL SZ8 (GLOVE) ×3 IMPLANT
GLOVE BIOGEL PI IND STRL 8 (GLOVE) ×1 IMPLANT
GLOVE BIOGEL PI INDICATOR 8 (GLOVE) ×2
GOWN STRL REUS W/ TWL LRG LVL3 (GOWN DISPOSABLE) ×1 IMPLANT
GOWN STRL REUS W/ TWL XL LVL3 (GOWN DISPOSABLE) ×1 IMPLANT
GOWN STRL REUS W/TWL LRG LVL3 (GOWN DISPOSABLE) ×3
GOWN STRL REUS W/TWL XL LVL3 (GOWN DISPOSABLE) ×3
KIT BASIN OR (CUSTOM PROCEDURE TRAY) ×3 IMPLANT
KIT TURNOVER KIT B (KITS) ×3 IMPLANT
LIGASURE IMPACT 36 18CM CVD LR (INSTRUMENTS) IMPLANT
NS IRRIG 1000ML POUR BTL (IV SOLUTION) ×6 IMPLANT
PACK GENERAL/GYN (CUSTOM PROCEDURE TRAY) ×3 IMPLANT
PAD ABD 8X10 STRL (GAUZE/BANDAGES/DRESSINGS) ×2 IMPLANT
PAD ARMBOARD 7.5X6 YLW CONV (MISCELLANEOUS) ×3 IMPLANT
PENCIL SMOKE EVACUATOR (MISCELLANEOUS) ×3 IMPLANT
SPECIMEN JAR LARGE (MISCELLANEOUS) IMPLANT
SPONGE DRAIN TRACH 4X4 STRL 2S (GAUZE/BANDAGES/DRESSINGS) ×2 IMPLANT
SPONGE LAP 18X18 RF (DISPOSABLE) ×4 IMPLANT
STAPLER VISISTAT 35W (STAPLE) ×3 IMPLANT
SUCTION POOLE TIP (SUCTIONS) ×3 IMPLANT
SUT PDS AB 1 TP1 96 (SUTURE) ×10 IMPLANT
SUT SILK 0 FSL (SUTURE) ×4 IMPLANT
SUT VIC AB 2-0 SH 18 (SUTURE) ×3 IMPLANT
SUT VIC AB 3-0 SH 18 (SUTURE) ×3 IMPLANT
SUT VICRYL AB 2 0 TIES (SUTURE) ×3 IMPLANT
SUT VICRYL AB 3 0 TIES (SUTURE) ×1 IMPLANT
SYSTEM SAHARA CHEST DRAIN ATS (WOUND CARE) ×2 IMPLANT
TAPE CLOTH SURG 6X10 WHT LF (GAUZE/BANDAGES/DRESSINGS) ×2 IMPLANT
TOWEL OR 17X24 6PK STRL BLUE (TOWEL DISPOSABLE) ×3 IMPLANT
TOWEL OR 17X26 10 PK STRL BLUE (TOWEL DISPOSABLE) ×3 IMPLANT
TRAY FOLEY MTR SLVR 16FR STAT (SET/KITS/TRAYS/PACK) ×2 IMPLANT
YANKAUER SUCT BULB TIP NO VENT (SUCTIONS) IMPLANT

## 2018-11-03 NOTE — Anesthesia Procedure Notes (Signed)
Arterial Line Insertion Start/End3/29/2020 5:56 PM Performed by: Suzy Bouchard, CRNA  Patient location: Pre-op. Preanesthetic checklist: patient identified, IV checked, site marked, risks and benefits discussed, surgical consent, monitors and equipment checked, pre-op evaluation, timeout performed and anesthesia consent Lidocaine 1% used for infiltration Right, radial was placed Catheter size: 20 Fr Hand hygiene performed  and maximum sterile barriers used   Attempts: 1 Procedure performed without using ultrasound guided technique. Following insertion, dressing applied. Post procedure assessment: normal and unchanged
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
## 2018-11-03 NOTE — Transfer of Care (Signed)
Immediate Anesthesia Transfer of Care Note  Patient: [name]  Procedure(s) Performed: EXPLORATORY LAPAROTOMY (N/A Abdomen)  Patient Location: PACU  Anesthesia Type:General  Level of Consciousness: awake, alert , oriented and patient cooperative  Airway & Oxygen Therapy: Patient Spontanous Breathing and Patient connected to face mask oxygen  Post-op Assessment: Report given to RN, Post -op Vital signs reviewed and stable and Patient moving all extremities X 4  Post vital signs: Reviewed and stable  Last Vitals:  Vitals Value Taken Time  BP    Temp    Pulse 67 11/03/2018  6:59 PM  Resp 21 11/03/2018  6:59 PM  SpO2 100 % 11/03/2018  6:59 PM  Vitals shown include unvalidated device data.  Last Pain: There were no vitals filed for this visit.       Complications: No apparent anesthesia complications
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
## 2018-11-03 NOTE — ED Triage Notes (Signed)
See trauma notes
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
## 2018-11-03 NOTE — Anesthesia Procedure Notes (Signed)
Procedure Name: Intubation Date/Time: 11/03/2018 5:47 PM Performed by: Clearnce Sorrel, CRNA Pre-anesthesia Checklist: Patient identified, Emergency Drugs available, Suction available, Patient being monitored and Timeout performed Patient Re-evaluated:Patient Re-evaluated prior to induction Oxygen Delivery Method: Circle system utilized Preoxygenation: Pre-oxygenation with 100% oxygen Induction Type: IV induction, Rapid sequence and Cricoid Pressure applied Laryngoscope Size: 4 and Glidescope Grade View: Grade I Tube type: Oral Tube size: 7.5 mm Number of attempts: 1 Airway Equipment and Method: Stylet Placement Confirmation: ETT inserted through vocal cords under direct vision,  positive ETCO2 and breath sounds checked- equal and bilateral Secured at: 23 cm Tube secured with: Tape Dental Injury: Teeth and Oropharynx as per pre-operative assessment

## 2018-11-04 ENCOUNTER — Encounter (HOSPITAL_COMMUNITY): Payer: Self-pay | Admitting: Surgery

## 2018-11-04 ENCOUNTER — Inpatient Hospital Stay (HOSPITAL_COMMUNITY): Payer: Medicaid Other

## 2018-11-04 DIAGNOSIS — X72XXXA Intentional self-harm by handgun discharge, initial encounter: Secondary | ICD-10-CM

## 2018-11-04 DIAGNOSIS — T1491XA Suicide attempt, initial encounter: Secondary | ICD-10-CM

## 2018-11-04 LAB — TYPE AND SCREEN
ABO/RH(D): A POS
Antibody Screen: NEGATIVE
Unit division: 0
Unit division: 0

## 2018-11-04 LAB — URINALYSIS, ROUTINE W REFLEX MICROSCOPIC
Bacteria, UA: NONE SEEN
Bilirubin Urine: NEGATIVE
Glucose, UA: NEGATIVE mg/dL
HGB URINE DIPSTICK: NEGATIVE
Ketones, ur: NEGATIVE mg/dL
Nitrite: NEGATIVE
Protein, ur: NEGATIVE mg/dL
Specific Gravity, Urine: 1.023 (ref 1.005–1.030)
pH: 5 (ref 5.0–8.0)

## 2018-11-04 LAB — BPAM RBC
BLOOD PRODUCT EXPIRATION DATE: 202004182359
Blood Product Expiration Date: 202004182359
ISSUE DATE / TIME: 202003291710
ISSUE DATE / TIME: 202003291710
UNIT TYPE AND RH: 5100
Unit Type and Rh: 5100

## 2018-11-04 LAB — CBC
HCT: 23.5 % — ABNORMAL LOW (ref 39.0–52.0)
Hemoglobin: 8.1 g/dL — ABNORMAL LOW (ref 13.0–17.0)
MCH: 33.6 pg (ref 26.0–34.0)
MCHC: 34.5 g/dL (ref 30.0–36.0)
MCV: 97.5 fL (ref 80.0–100.0)
Platelets: 129 10*3/uL — ABNORMAL LOW (ref 150–400)
RBC: 2.41 MIL/uL — ABNORMAL LOW (ref 4.22–5.81)
RDW: 13.5 % (ref 11.5–15.5)
WBC: 17.3 10*3/uL — ABNORMAL HIGH (ref 4.0–10.5)
nRBC: 0 % (ref 0.0–0.2)

## 2018-11-04 LAB — BLOOD PRODUCT ORDER (VERBAL) VERIFICATION
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
## 2018-11-04 MED ORDER — METHOCARBAMOL 500 MG PO TABS
500.0000 mg | ORAL_TABLET | Freq: Four times a day (QID) | ORAL | Status: DC
  Administered 2018-11-04 – 2018-12-09 (×138): 500 mg via ORAL
  Filled 2018-11-04 (×139): qty 1

## 2018-11-04 MED ORDER — LEVOTHYROXINE SODIUM 100 MCG/5ML IV SOLN
87.5000 ug | Freq: Every day | INTRAVENOUS | Status: DC
  Administered 2018-11-04 – 2018-11-06 (×3): 87.5 ug via INTRAVENOUS
  Filled 2018-11-04 (×4): qty 5

## 2018-11-04 MED ORDER — LEVOTHYROXINE SODIUM 100 MCG/5ML IV SOLN: 75.0000 ug | Freq: Every day | INTRAVENOUS | Status: DC
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
## 2018-11-04 NOTE — Anesthesia Postprocedure Evaluation (Signed)
Anesthesia Post Note  Patient: [name]  Procedure(s) Performed: EXPLORATORY LAPAROTOMY (N/A Abdomen)     Patient location during evaluation: PACU Anesthesia Type: General Level of consciousness: awake and alert Pain management: pain level controlled Vital Signs Assessment: post-procedure vital signs reviewed and stable Respiratory status: spontaneous breathing, nonlabored ventilation, respiratory function stable and patient connected to nasal cannula oxygen Cardiovascular status: blood pressure returned to baseline and stable Postop Assessment: no apparent nausea or vomiting Anesthetic complications: no    Last Vitals:  Vitals:   11/04/18 0418 11/04/18 0507  BP:  115/84  Pulse:  66  Resp: 14 16  Temp:  37.3 C  SpO2: 98%     Last Pain:  Vitals:   11/04/18 0507  TempSrc: Oral  PainSc:                  [name] [name]
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
## 2018-11-04 NOTE — Progress Notes (Signed)
Pharmacy is unable to confirm the medications the patient was taking at home. All options have been exhausted and a resolution to the situation is not expected.   Where possible, their outpatient pharmacy(s) have been contacted for the last time prescriptions were filled and that information has been added to each medication in an Order Note (highlighted yellow below the medication).  Please contact pharmacy if further assistance is needed.   Romeo Rabon, PharmD. Mobile: [phone]. 11/04/2018,12:23 PM.

## 2018-11-05 ENCOUNTER — Inpatient Hospital Stay (HOSPITAL_COMMUNITY): Payer: Medicaid Other

## 2018-11-05 ENCOUNTER — Encounter (HOSPITAL_COMMUNITY): Payer: Self-pay

## 2018-11-05 LAB — COMPREHENSIVE METABOLIC PANEL
ALT: 11 U/L (ref 0–44)
AST: 26 U/L (ref 15–41)
Albumin: 2.7 g/dL — ABNORMAL LOW (ref 3.5–5.0)
Alkaline Phosphatase: 38 U/L (ref 38–126)
Anion gap: 7 (ref 5–15)
BUN: 13 mg/dL (ref 6–20)
CO2: 20 mmol/L — ABNORMAL LOW (ref 22–32)
CREATININE: 0.92 mg/dL (ref 0.61–1.24)
Calcium: 8 mg/dL — ABNORMAL LOW (ref 8.9–10.3)
Chloride: 112 mmol/L — ABNORMAL HIGH (ref 98–111)
GFR calc non Af Amer: >60 mL/min (ref >60)
Glucose, Bld: 95 mg/dL (ref 70–99)
Potassium: 4 mmol/L (ref 3.5–5.1)
Sodium: 139 mmol/L (ref 135–145)
Total Bilirubin: 0.6 mg/dL (ref 0.3–1.2)
Total Protein: 4.7 g/dL — ABNORMAL LOW (ref 6.5–8.1)

## 2018-11-05 LAB — CBC
HCT: 21.7 % — ABNORMAL LOW (ref 39.0–52.0)
Hemoglobin: 7.4 g/dL — ABNORMAL LOW (ref 13.0–17.0)
MCH: 34.1 pg — ABNORMAL HIGH (ref 26.0–34.0)
MCHC: 34.1 g/dL (ref 30.0–36.0)
MCV: 100 fL (ref 80.0–100.0)
Platelets: 121 10*3/uL — ABNORMAL LOW (ref 150–400)
RBC: 2.17 MIL/uL — ABNORMAL LOW (ref 4.22–5.81)
RDW: 13.6 % (ref 11.5–15.5)
WBC: 15 10*3/uL — ABNORMAL HIGH (ref 4.0–10.5)
nRBC: 0 % (ref 0.0–0.2)
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
## 2018-11-05 MED ORDER — ACETAMINOPHEN 10 MG/ML IV SOLN
1000.0000 mg | Freq: Four times a day (QID) | INTRAVENOUS | Status: AC
  Administered 2018-11-05 – 2018-11-06 (×4): 1000 mg via INTRAVENOUS
  Filled 2018-11-05 (×3): qty 100
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
## 2018-11-06 ENCOUNTER — Inpatient Hospital Stay (HOSPITAL_COMMUNITY): Payer: Medicaid Other

## 2018-11-06 LAB — CBC
HCT: 21.4 % — ABNORMAL LOW (ref 39.0–52.0)
Hemoglobin: 7.4 g/dL — ABNORMAL LOW (ref 13.0–17.0)
MCH: 34.9 pg — AB (ref 26.0–34.0)
MCHC: 34.6 g/dL (ref 30.0–36.0)
MCV: 100.9 fL — ABNORMAL HIGH (ref 80.0–100.0)
Platelets: 134 10*3/uL — ABNORMAL LOW (ref 150–400)
RBC: 2.12 MIL/uL — AB (ref 4.22–5.81)
RDW: 13.3 % (ref 11.5–15.5)
WBC: 14.6 10*3/uL — ABNORMAL HIGH (ref 4.0–10.5)
nRBC: 0 % (ref 0.0–0.2)

## 2018-11-06 IMAGING — DX PORTABLE CHEST - 1 VIEW
1 series · 1 of 1 positions shown · non-contrast
Comparison: None.

CLINICAL DATA: Chest tube, left hemothorax

EXAM:
PORTABLE CHEST 1 VIEW

[chest ap]
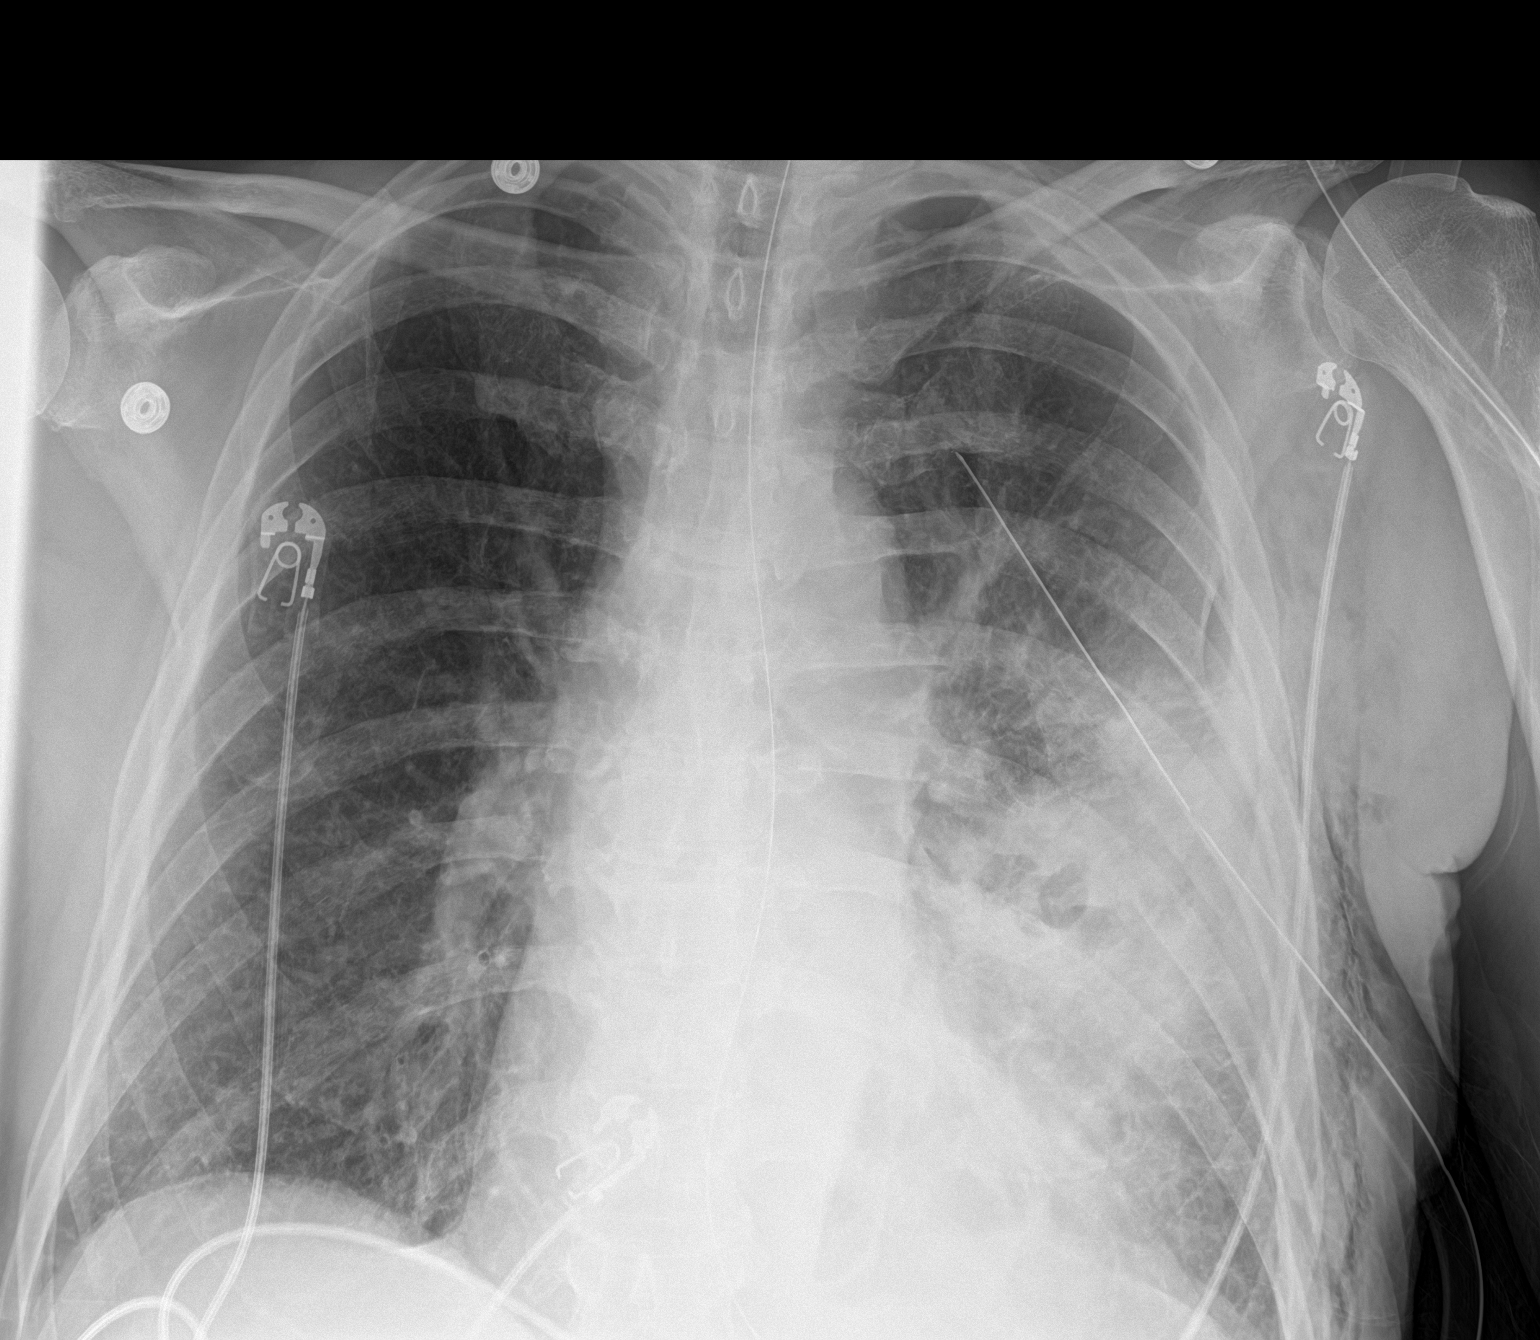

[1 of 1 positions shown; findings below may reference images not displayed]

FINDINGS: Left chest tube and NG tube remain in place, unchanged. Small left
apical pneumothorax, 5-10%. Subcutaneous emphysema in the left chest
wall, stable. Airspace disease in the left lower lung, stable. Heart
is normal size. Right lung clear.
IMPRESSION: Left chest tube in place with small left apical pneumothorax.

Stable left lower lobe airspace disease.

## 2018-11-06 MED ORDER — MORPHINE SULFATE (PF) 2 MG/ML IV SOLN
2.0000 mg | INTRAVENOUS | Status: DC | PRN
  Administered 2018-11-06: 2 mg via INTRAVENOUS
  Administered 2018-11-06: 4 mg via INTRAVENOUS
  Administered 2018-11-07: 2 mg via INTRAVENOUS
  Filled 2018-11-06 (×3): qty 2
  Filled 2018-11-06: qty 1

## 2018-11-06 NOTE — Progress Notes (Signed)
Wasted 14 mL fentanyl with Kassie Mends. Order for PCA was discontinued.
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
## 2018-11-06 NOTE — Progress Notes (Signed)
Wasted 14 ml in sharps container in medication room with patient's Nurse, Marlene Bast, RN.
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
## 2018-11-07 ENCOUNTER — Inpatient Hospital Stay (HOSPITAL_COMMUNITY): Payer: Medicaid Other

## 2018-11-07 ENCOUNTER — Encounter (HOSPITAL_COMMUNITY): Payer: Self-pay | Admitting: *Deleted

## 2018-11-07 ENCOUNTER — Other Ambulatory Visit: Payer: Self-pay

## 2018-11-07 LAB — CBC
HCT: 21.6 % — ABNORMAL LOW (ref 39.0–52.0)
Hemoglobin: 7.4 g/dL — ABNORMAL LOW (ref 13.0–17.0)
MCH: 34.3 pg — ABNORMAL HIGH (ref 26.0–34.0)
MCHC: 34.3 g/dL (ref 30.0–36.0)
MCV: 100 fL (ref 80.0–100.0)
Platelets: 169 10*3/uL (ref 150–400)
RBC: 2.16 MIL/uL — ABNORMAL LOW (ref 4.22–5.81)
RDW: 12.7 % (ref 11.5–15.5)
WBC: 18.3 10*3/uL — ABNORMAL HIGH (ref 4.0–10.5)
nRBC: 0 % (ref 0.0–0.2)

## 2018-11-07 LAB — MRSA PCR SCREENING: MRSA by PCR: NEGATIVE

## 2018-11-07 IMAGING — DX PORTABLE CHEST - 1 VIEW
1 series · 2 of 2 positions shown · non-contrast
Comparison: [DATE]

CLINICAL DATA: Left hemothorax.

EXAM:
PORTABLE CHEST 1 VIEW

[Series 1: chest · 0.14mm/px · 2 of 2 slices shown]
[im 1/2]
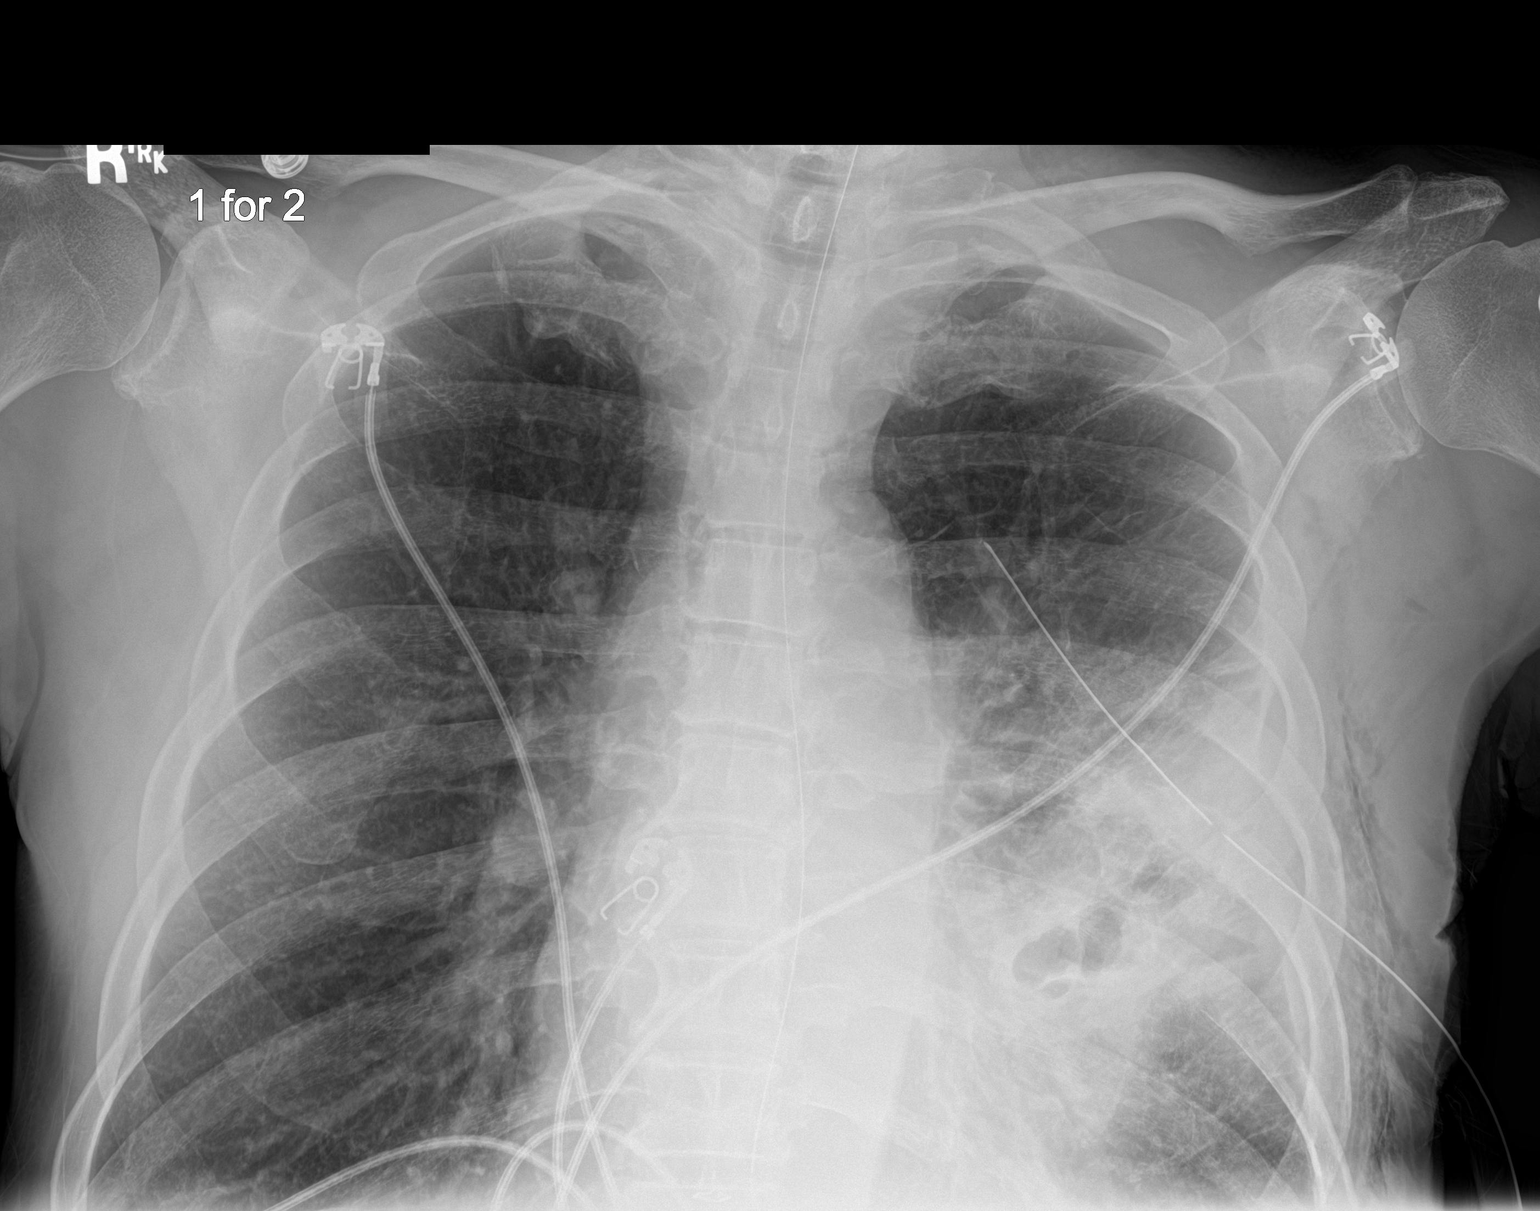
[im 2/2]
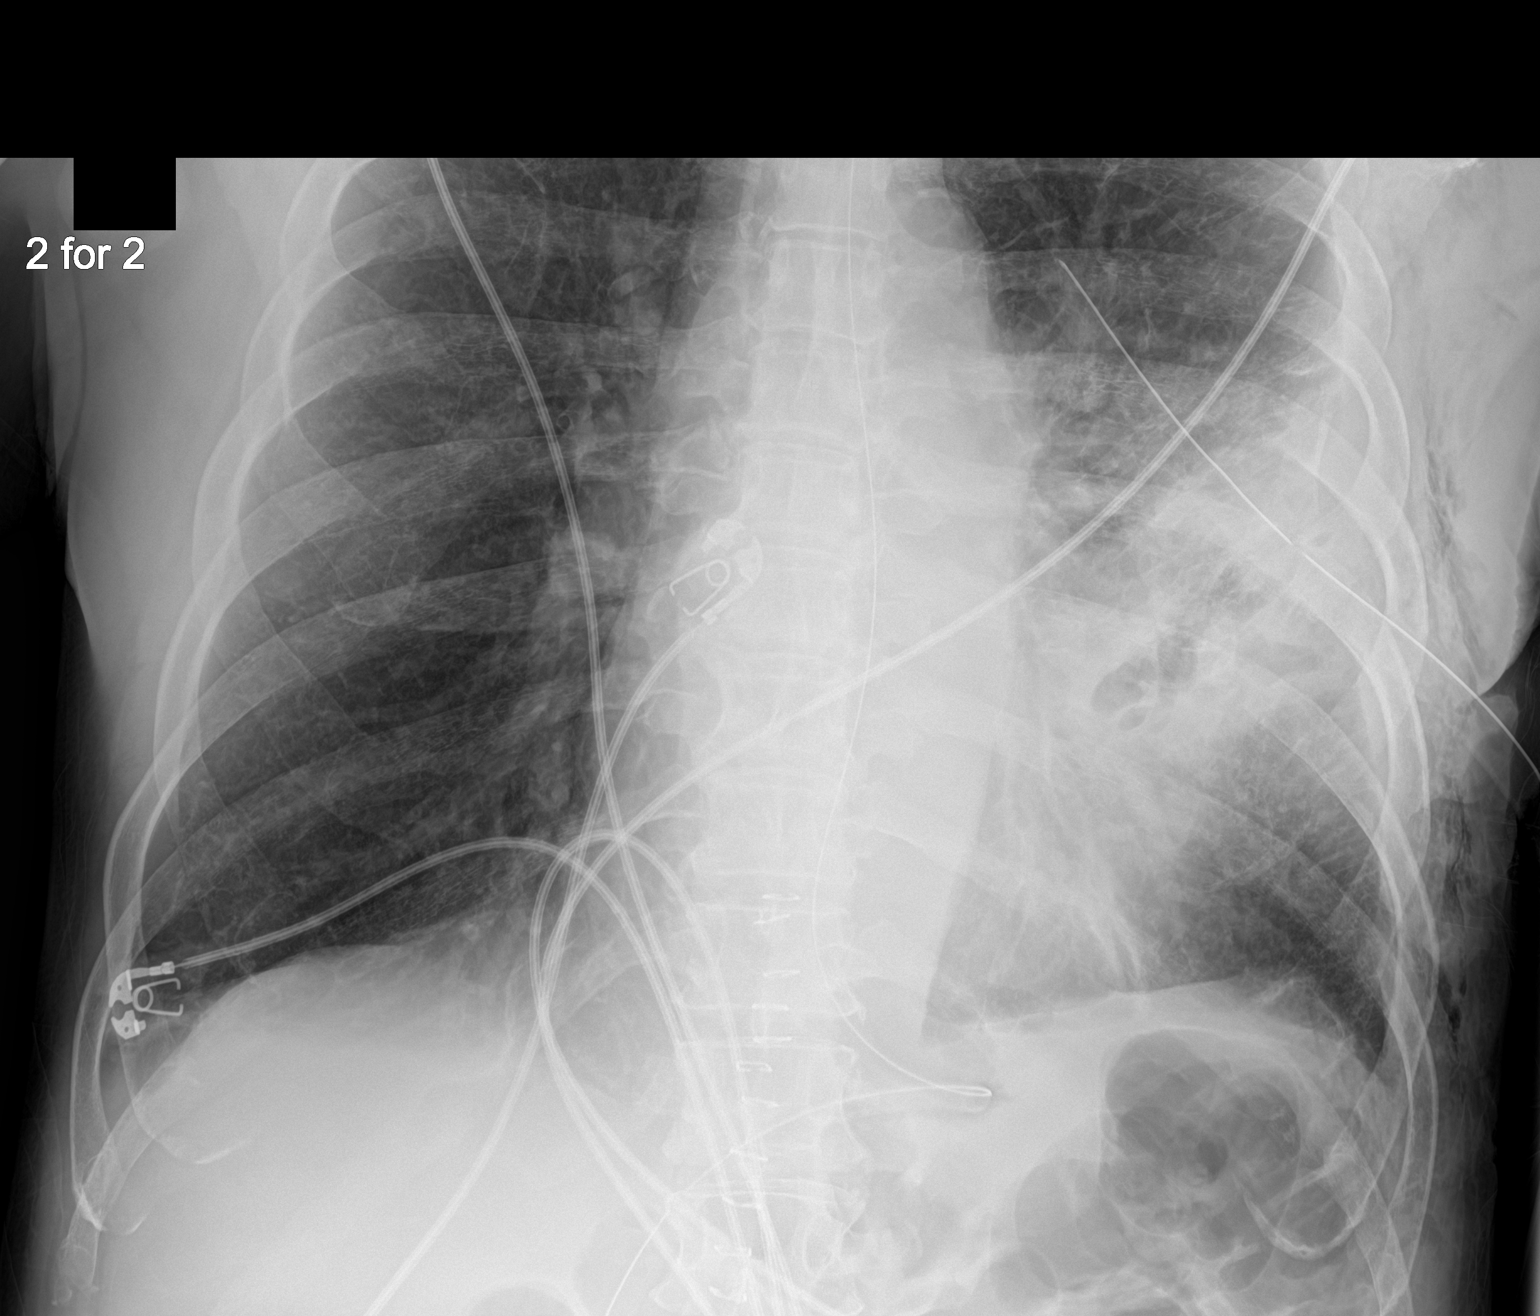

[2 of 2 positions shown; findings below may reference images not displayed]

FINDINGS: Left chest tube remains in place. No visible pneumothorax currently.
Airspace disease in the left mid and lower lung, stable.
Subcutaneous emphysema in the left chest wall, stable. Right lung
clear.

NG tube enters the stomach.
IMPRESSION: Left chest tube remains in place without pneumothorax. Stable
airspace disease in the left mid and lower lung.

## 2018-11-07 MED ORDER — MORPHINE SULFATE (PF) 2 MG/ML IV SOLN
2.0000 mg | INTRAVENOUS | Status: DC | PRN
  Administered 2018-11-09: 4 mg via INTRAVENOUS
  Filled 2018-11-07: qty 2

## 2018-11-07 MED ORDER — DOCUSATE SODIUM 100 MG PO CAPS
100.0000 mg | ORAL_CAPSULE | Freq: Every day | ORAL | Status: DC
  Administered 2018-11-07 – 2018-11-28 (×13): 100 mg via ORAL
  Filled 2018-11-07 (×21): qty 1

## 2018-11-07 MED ORDER — OXYCODONE HCL 5 MG PO TABS
5.0000 mg | ORAL_TABLET | ORAL | Status: DC | PRN
  Administered 2018-11-07 – 2018-11-18 (×13): 10 mg via ORAL
  Administered 2018-11-19: 5 mg via ORAL
  Administered 2018-11-19 – 2018-12-01 (×12): 10 mg via ORAL
  Filled 2018-11-07 (×18): qty 2
  Filled 2018-11-07: qty 1
  Filled 2018-11-07 (×9): qty 2

## 2018-11-07 MED ORDER — LEVOTHYROXINE SODIUM 75 MCG PO TABS
175.0000 ug | ORAL_TABLET | Freq: Every day | ORAL | Status: DC
  Administered 2018-11-07 – 2018-12-09 (×32): 175 ug via ORAL
  Filled 2018-11-07 (×33): qty 1
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
## 2018-11-08 ENCOUNTER — Inpatient Hospital Stay (HOSPITAL_COMMUNITY): Payer: Medicaid Other

## 2018-11-08 LAB — CBC
HCT: 20.9 % — ABNORMAL LOW (ref 39.0–52.0)
Hemoglobin: 7.2 g/dL — ABNORMAL LOW (ref 13.0–17.0)
MCH: 33.5 pg (ref 26.0–34.0)
MCHC: 34.4 g/dL (ref 30.0–36.0)
MCV: 97.2 fL (ref 80.0–100.0)
Platelets: 219 10*3/uL (ref 150–400)
RBC: 2.15 MIL/uL — ABNORMAL LOW (ref 4.22–5.81)
RDW: 12.2 % (ref 11.5–15.5)
WBC: 12.7 10*3/uL — ABNORMAL HIGH (ref 4.0–10.5)
nRBC: 0 % (ref 0.0–0.2)

## 2018-11-08 IMAGING — DX PORTABLE CHEST - 1 VIEW
1 series · 1 of 1 positions shown · non-contrast
Comparison: [DATE]

CLINICAL DATA: Follow-up chest tube

EXAM:
PORTABLE CHEST 1 VIEW

[chest ap]
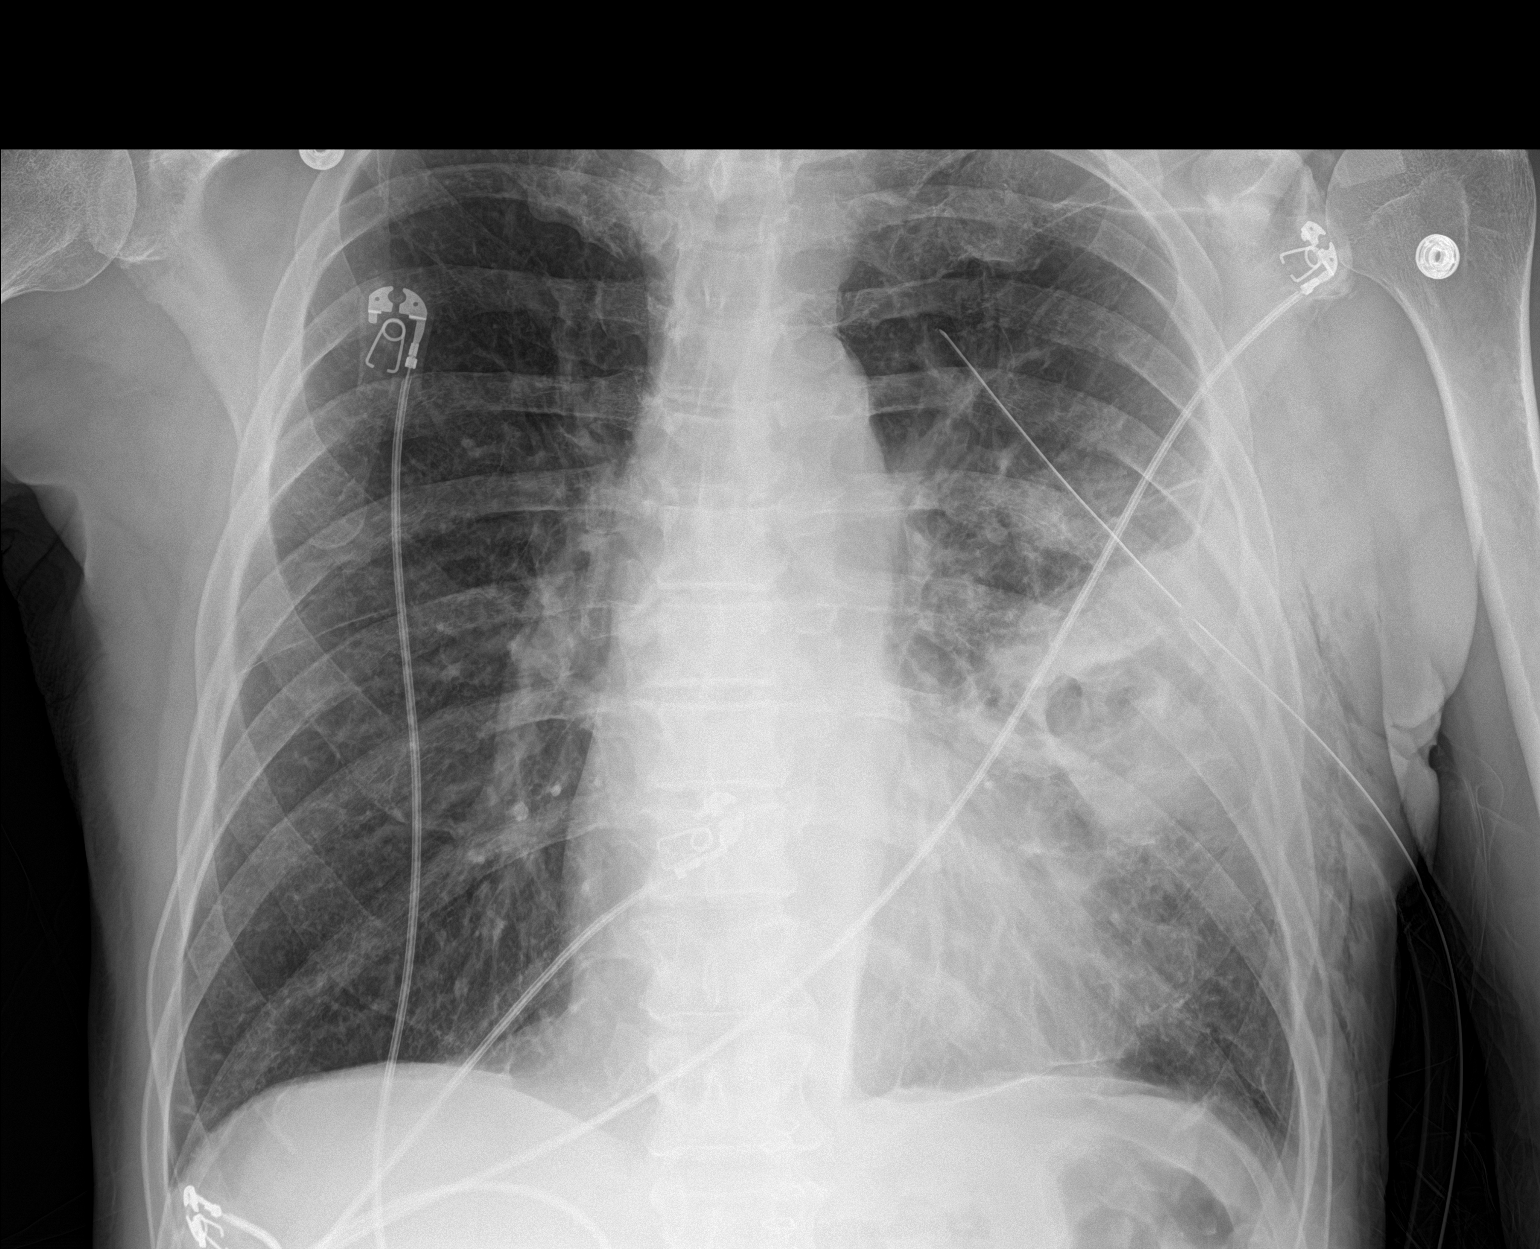

[1 of 1 positions shown; findings below may reference images not displayed]

FINDINGS: Cardiac shadow is stable. The lungs are hyperinflated. Persistent
density is noted in the left mid lung with some changes of
cavitation thoracostomy catheter is noted. No sizable pneumothorax
is seen. No new focal abnormality is seen.
IMPRESSION: Stable appearance of the left mid lung opacity. No pneumothorax is
noted.

## 2018-11-08 MED ORDER — ACETAMINOPHEN 325 MG PO TABS
650.0000 mg | ORAL_TABLET | ORAL | Status: DC | PRN
  Administered 2018-11-08 – 2018-11-11 (×8): 650 mg via ORAL
  Filled 2018-11-08 (×8): qty 2
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
## 2018-11-09 ENCOUNTER — Inpatient Hospital Stay (HOSPITAL_COMMUNITY): Payer: Medicaid Other

## 2018-11-09 LAB — CBC
HCT: 20 % — ABNORMAL LOW (ref 39.0–52.0)
Hemoglobin: 7.1 g/dL — ABNORMAL LOW (ref 13.0–17.0)
MCH: 34.8 pg — ABNORMAL HIGH (ref 26.0–34.0)
MCHC: 35.5 g/dL (ref 30.0–36.0)
MCV: 98 fL (ref 80.0–100.0)
Platelets: 267 10*3/uL (ref 150–400)
RBC: 2.04 MIL/uL — ABNORMAL LOW (ref 4.22–5.81)
RDW: 12.2 % (ref 11.5–15.5)
WBC: 11.8 10*3/uL — ABNORMAL HIGH (ref 4.0–10.5)
nRBC: 0 % (ref 0.0–0.2)

## 2018-11-09 IMAGING — DX PORTABLE CHEST - 1 VIEW
2 series · 2 of 2 positions shown · non-contrast
Comparison: Earlier radiograph dated [DATE]

CLINICAL DATA: 57-year-old male status post removal of chest.

EXAM:
PORTABLE CHEST 1 VIEW

[chest ap (1 of 2)]
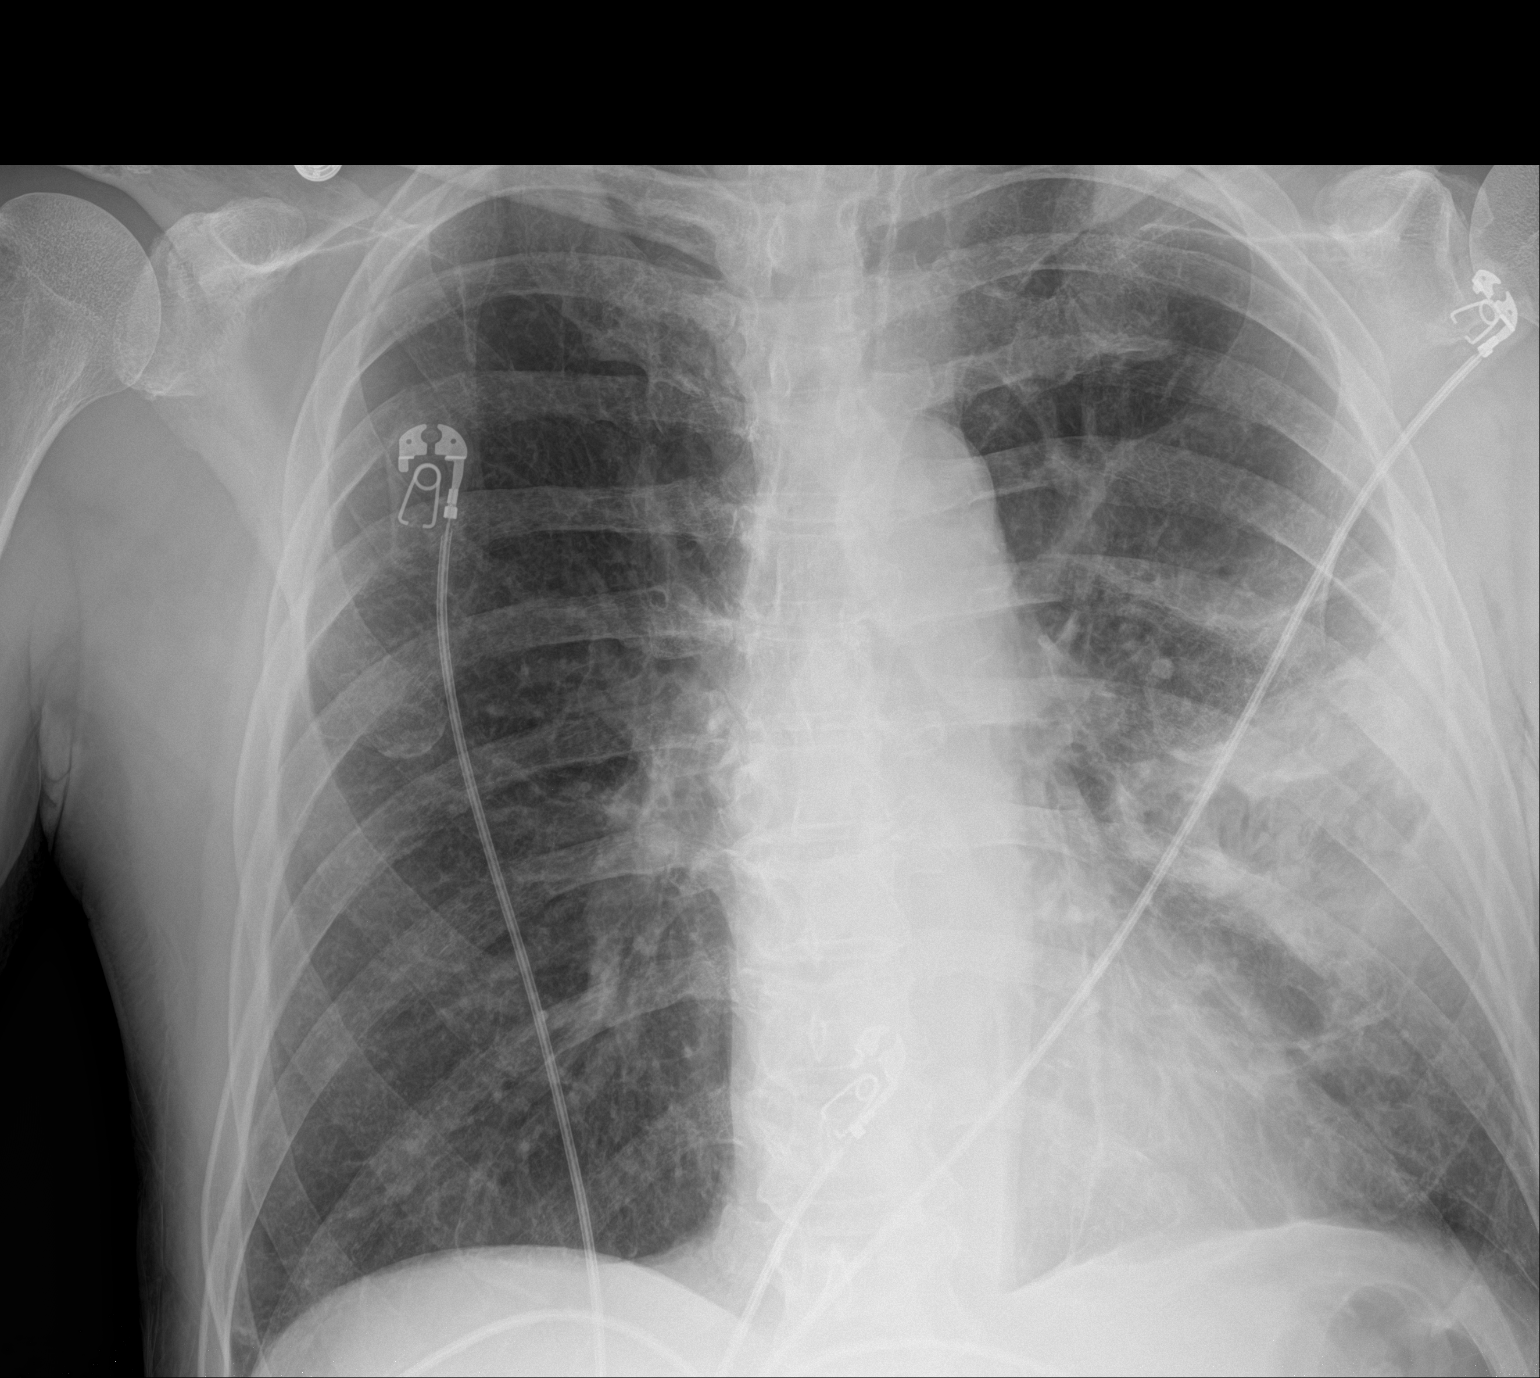

[chest ap (2 of 2)]
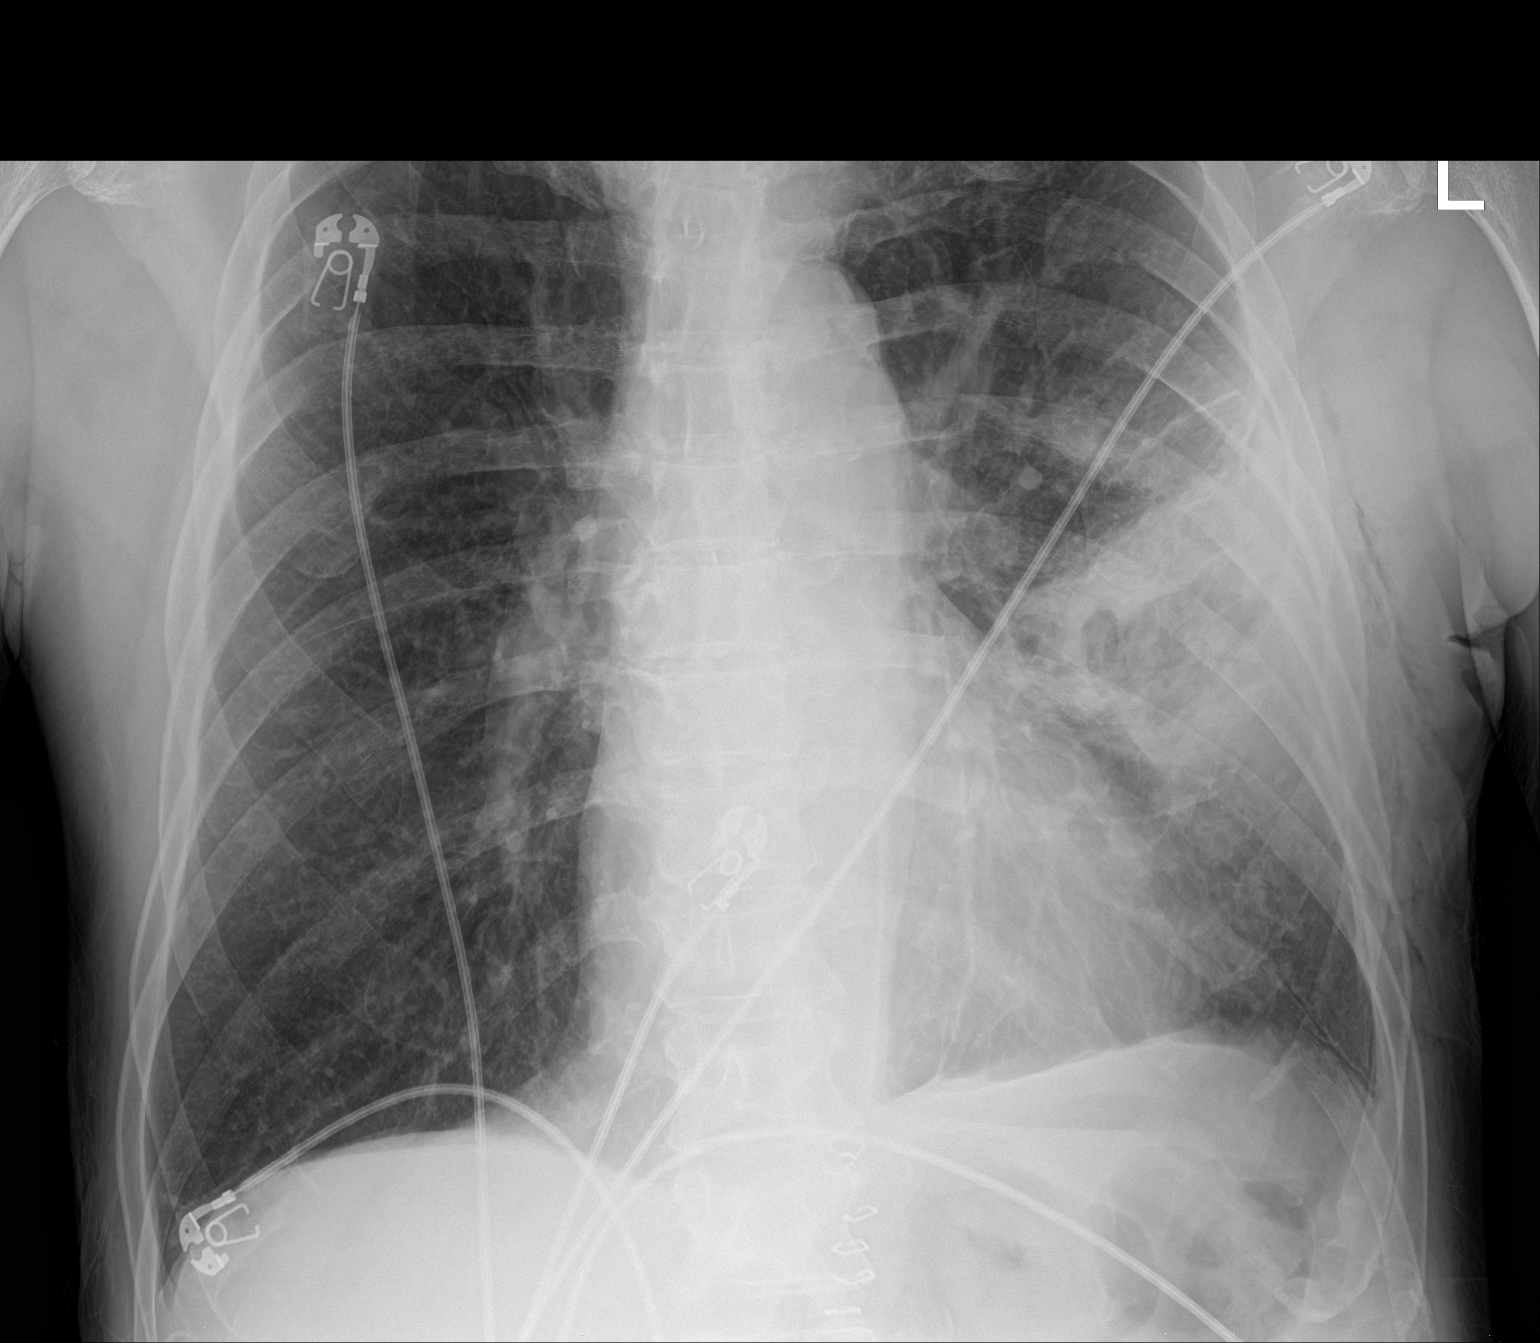

[2 of 2 positions shown; findings below may reference images not displayed]

FINDINGS: There has been interval removal of the left-sided chest tube. No
obvious pneumothorax detected. Stable area of poor parenchyma
opacification in the left mid lung field. No significant pleural
effusion. The cardiac silhouette is within normal limits. Left chest
wall soft tissue emphysema.
IMPRESSION: 1. Interval removal of the left-sided chest tube. No obvious
pneumothorax.
2. Stable appearance of the lungs.

## 2018-11-09 IMAGING — DX PORTABLE CHEST - 1 VIEW
1 series · 2 of 2 positions shown · non-contrast
Comparison: Yesterday

CLINICAL DATA: Hemothorax on the left

EXAM:
PORTABLE CHEST 1 VIEW

[Series 1: chest · 0.14mm/px · 2 of 2 slices shown]
[im 1/2]
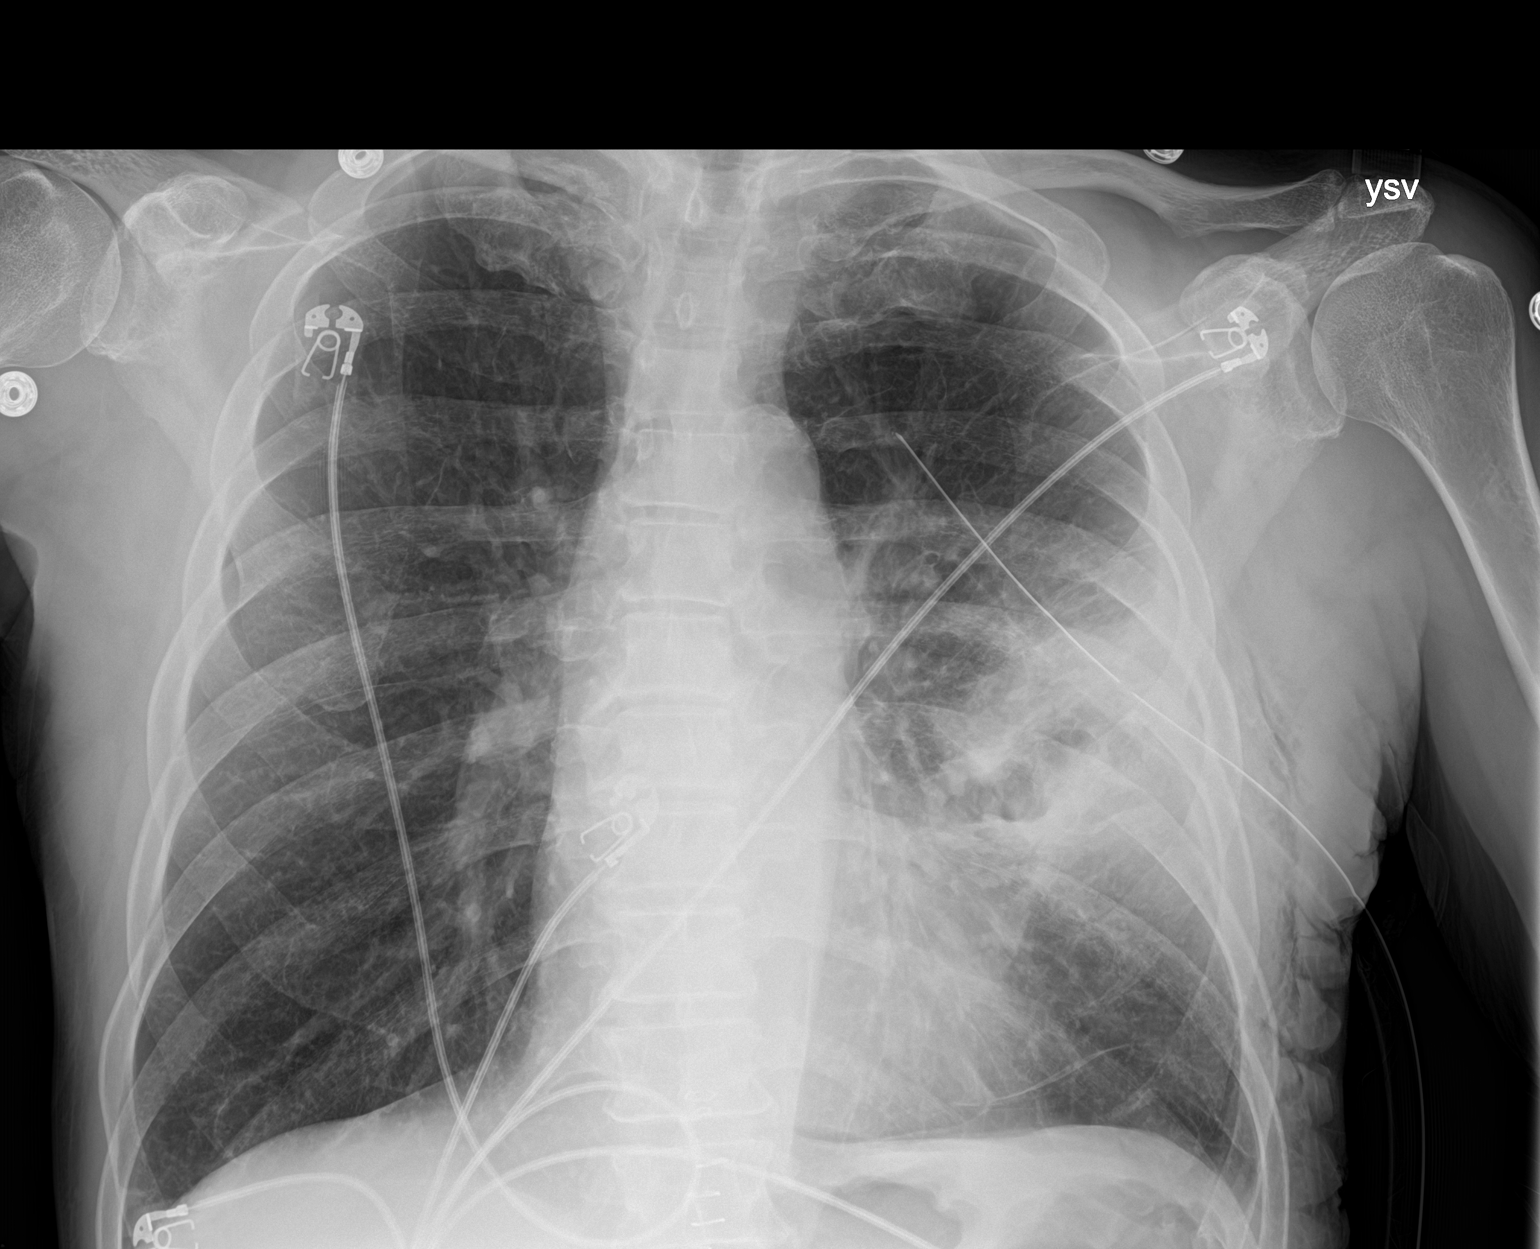
[im 2/2]
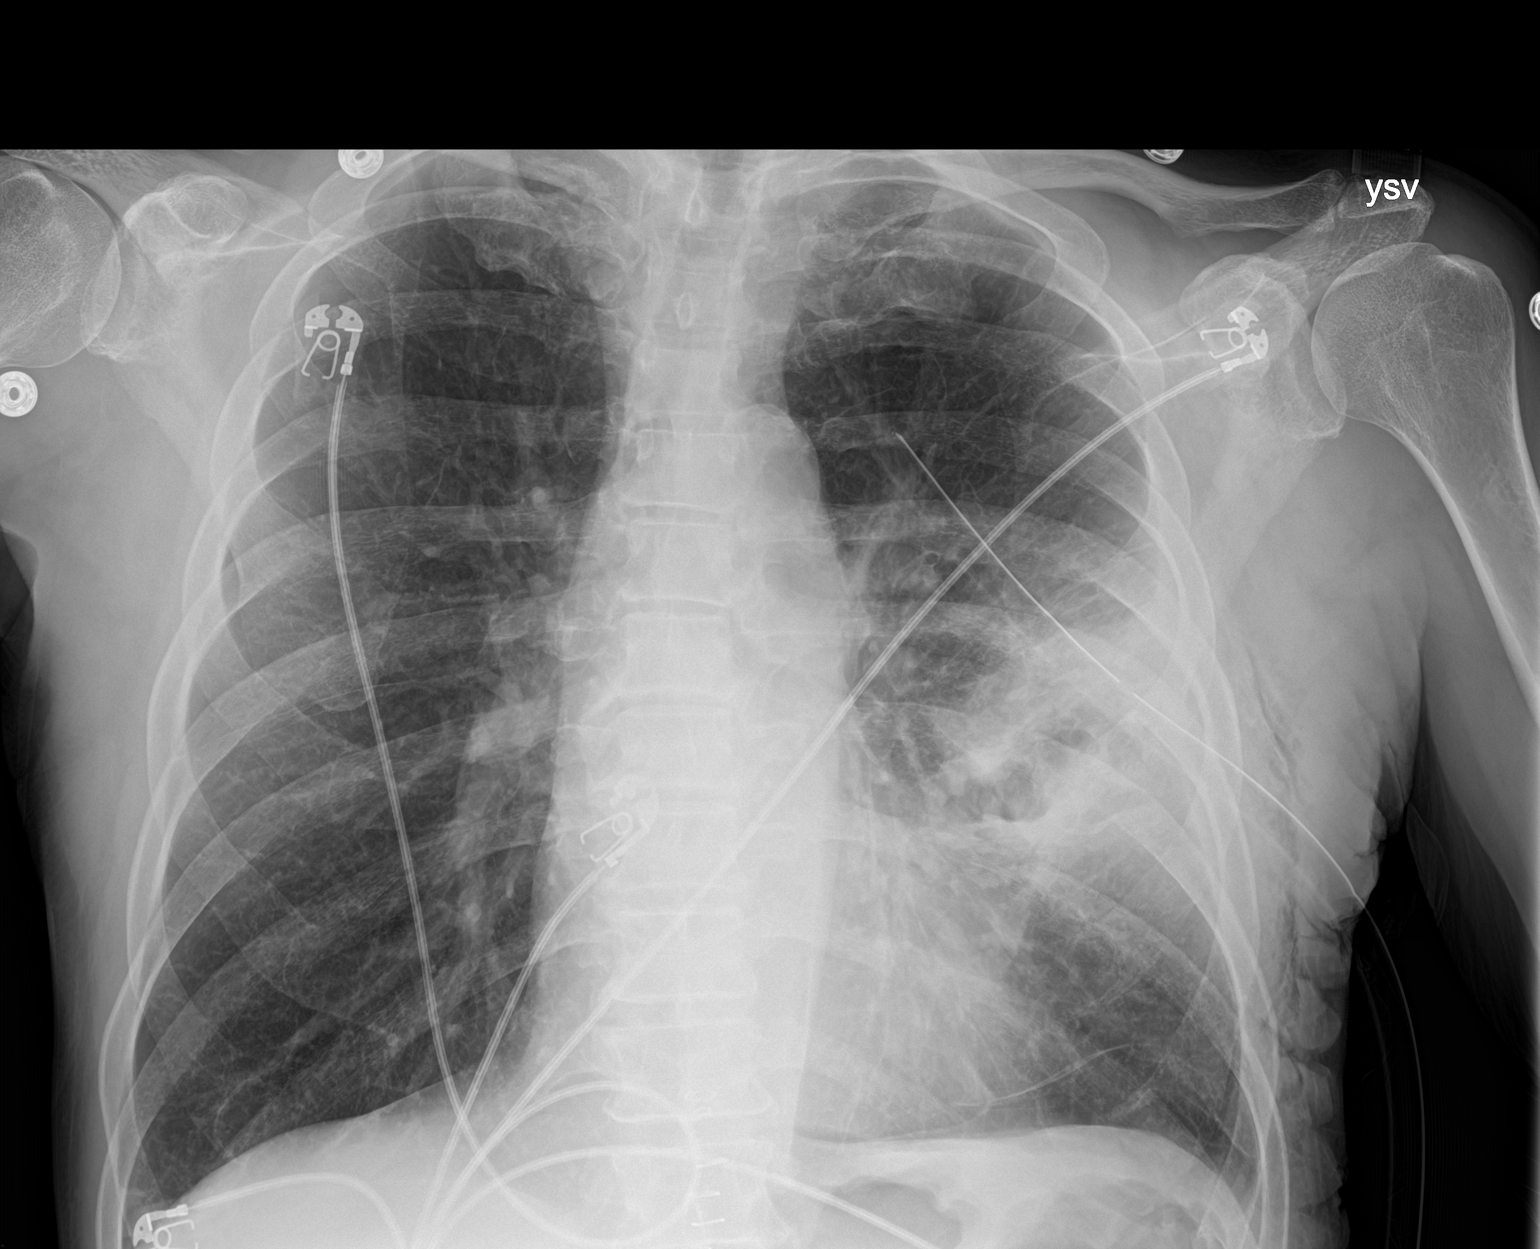

[2 of 2 positions shown; findings below may reference images not displayed]

FINDINGS: Stable left chest tube positioning. Stable pulmonary opacification
and local pleural or extrapleural thickening. Left chest wall
emphysema. Possible trace pneumothorax at the left base. The right
lung is clear. Normal heart size and mediastinal contours.
IMPRESSION: 1. Stable left parenchymal opacification.
2. Possible trace left basal pneumothorax.
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
## 2018-11-10 LAB — URINALYSIS, ROUTINE W REFLEX MICROSCOPIC
Bacteria, UA: NONE SEEN
Bilirubin Urine: NEGATIVE
Glucose, UA: NEGATIVE mg/dL
Ketones, ur: NEGATIVE mg/dL
Leukocytes,Ua: NEGATIVE
Nitrite: NEGATIVE
Protein, ur: NEGATIVE mg/dL
Specific Gravity, Urine: 1.013 (ref 1.005–1.030)
pH: 6 (ref 5.0–8.0)

## 2018-11-10 LAB — CBC
HCT: 20 % — ABNORMAL LOW (ref 39.0–52.0)
Hemoglobin: 7 g/dL — ABNORMAL LOW (ref 13.0–17.0)
MCH: 34.1 pg — ABNORMAL HIGH (ref 26.0–34.0)
MCHC: 35 g/dL (ref 30.0–36.0)
MCV: 97.6 fL (ref 80.0–100.0)
Platelets: 320 10*3/uL (ref 150–400)
RBC: 2.05 MIL/uL — ABNORMAL LOW (ref 4.22–5.81)
RDW: 12.1 % (ref 11.5–15.5)
WBC: 9.3 10*3/uL (ref 4.0–10.5)
nRBC: 0 % (ref 0.0–0.2)
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
## 2018-11-11 ENCOUNTER — Encounter (HOSPITAL_COMMUNITY): Payer: Self-pay | Admitting: Radiology

## 2018-11-11 ENCOUNTER — Inpatient Hospital Stay (HOSPITAL_COMMUNITY): Payer: Medicaid Other

## 2018-11-11 DIAGNOSIS — I10 Essential (primary) hypertension: Secondary | ICD-10-CM

## 2018-11-11 DIAGNOSIS — J869 Pyothorax without fistula: Secondary | ICD-10-CM

## 2018-11-11 DIAGNOSIS — X72XXXA Intentional self-harm by handgun discharge, initial encounter: Secondary | ICD-10-CM

## 2018-11-11 DIAGNOSIS — S27391A Other injuries of lung, unilateral, initial encounter: Secondary | ICD-10-CM

## 2018-11-11 LAB — PREPARE RBC (CROSSMATCH)

## 2018-11-11 LAB — COMPREHENSIVE METABOLIC PANEL
ALT: 48 U/L — ABNORMAL HIGH (ref 0–44)
AST: 87 U/L — ABNORMAL HIGH (ref 15–41)
Albumin: 2.5 g/dL — ABNORMAL LOW (ref 3.5–5.0)
Alkaline Phosphatase: 72 U/L (ref 38–126)
Anion gap: 10 (ref 5–15)
BUN: 8 mg/dL (ref 6–20)
CO2: 21 mmol/L — ABNORMAL LOW (ref 22–32)
Calcium: 8.4 mg/dL — ABNORMAL LOW (ref 8.9–10.3)
Chloride: 100 mmol/L (ref 98–111)
Creatinine, Ser: 0.93 mg/dL (ref 0.61–1.24)
GFR calc Af Amer: >60 mL/min (ref >60)
GFR calc non Af Amer: >60 mL/min (ref >60)
Glucose, Bld: 134 mg/dL — ABNORMAL HIGH (ref 70–99)
Potassium: 4.2 mmol/L (ref 3.5–5.1)
Sodium: 131 mmol/L — ABNORMAL LOW (ref 135–145)
Total Bilirubin: 0.5 mg/dL (ref 0.3–1.2)
Total Protein: 5.1 g/dL — ABNORMAL LOW (ref 6.5–8.1)

## 2018-11-11 LAB — PROTIME-INR
INR: 1.3 — ABNORMAL HIGH (ref 0.8–1.2)
Prothrombin Time: 15.6 seconds — ABNORMAL HIGH (ref 11.4–15.2)

## 2018-11-11 LAB — CBC
HCT: 21.2 % — ABNORMAL LOW (ref 39.0–52.0)
Hemoglobin: 7.1 g/dL — ABNORMAL LOW (ref 13.0–17.0)
MCH: 32.7 pg (ref 26.0–34.0)
MCHC: 33.5 g/dL (ref 30.0–36.0)
MCV: 97.7 fL (ref 80.0–100.0)
Platelets: 394 10*3/uL (ref 150–400)
RBC: 2.17 MIL/uL — ABNORMAL LOW (ref 4.22–5.81)
RDW: 12.4 % (ref 11.5–15.5)
WBC: 10.9 10*3/uL — ABNORMAL HIGH (ref 4.0–10.5)
nRBC: 0 % (ref 0.0–0.2)

## 2018-11-11 LAB — BLOOD GAS, ARTERIAL
Acid-Base Excess: 0.8 mmol/L (ref 0.0–2.0)
Bicarbonate: 24.1 mmol/L (ref 20.0–28.0)
Drawn by: 44898
FIO2: 21
O2 Saturation: 93.8 %
Patient temperature: 98.6
pCO2 arterial: 33.1 mmHg (ref 32.0–48.0)
pH, Arterial: 7.475 — ABNORMAL HIGH (ref 7.350–7.450)
pO2, Arterial: 66.6 mmHg — ABNORMAL LOW (ref 83.0–108.0)

## 2018-11-11 LAB — SURGICAL PCR SCREEN
MRSA, PCR: NEGATIVE
Staphylococcus aureus: POSITIVE — AB

## 2018-11-11 LAB — APTT: aPTT: 40 seconds — ABNORMAL HIGH (ref 24–36)

## 2018-11-11 LAB — TSH: TSH: 26.177 u[IU]/mL — ABNORMAL HIGH (ref 0.350–4.500)
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
## 2018-11-11 MED ORDER — THIAMINE HCL 100 MG/ML IJ SOLN
100.0000 mg | Freq: Every day | INTRAMUSCULAR | Status: DC
  Administered 2018-11-11 – 2018-11-13 (×2): 100 mg via INTRAVENOUS
  Filled 2018-11-11 (×2): qty 2

## 2018-11-11 MED ORDER — PIPERACILLIN-TAZOBACTAM 3.375 G IVPB
3.3750 g | Freq: Three times a day (TID) | INTRAVENOUS | Status: AC
  Administered 2018-11-11: 22:00:00 3.375 g via INTRAVENOUS
  Filled 2018-11-11: qty 50

## 2018-11-11 MED ORDER — VANCOMYCIN HCL 10 G IV SOLR
1500.0000 mg | Freq: Two times a day (BID) | INTRAVENOUS | Status: DC
  Administered 2018-11-11 – 2018-11-12 (×2): 1500 mg via INTRAVENOUS
  Filled 2018-11-11 (×5): qty 1500

## 2018-11-11 MED ORDER — PIPERACILLIN-TAZOBACTAM 3.375 G IVPB
3.3750 g | Freq: Once | INTRAVENOUS | Status: AC
  Administered 2018-11-12: 3.375 g via INTRAVENOUS
  Filled 2018-11-11: qty 50

## 2018-11-11 MED ORDER — PIPERACILLIN-TAZOBACTAM 3.375 G IVPB
3.3750 g | Freq: Three times a day (TID) | INTRAVENOUS | Status: DC
  Administered 2018-11-11: 15:00:00 3.375 g via INTRAVENOUS
  Filled 2018-11-11 (×2): qty 50

## 2018-11-11 MED ORDER — IOHEXOL 300 MG/ML  SOLN
100.0000 mL | Freq: Once | INTRAMUSCULAR | Status: AC | PRN
  Administered 2018-11-11: 100 mL via INTRAVENOUS

## 2018-11-11 MED ORDER — PIPERACILLIN-TAZOBACTAM 3.375 G IVPB: 3.3750 g | Freq: Three times a day (TID) | INTRAVENOUS | Status: DC
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
## 2018-11-11 NOTE — Progress Notes (Signed)
MD paged concerning pts temp of 101.0 prior to starting blood transfusion. Order to continue with transfusion and treat temp with PRN med. PRN med given and blood administered with signs and symptoms explained to pt to watch for during transaction. Will continue to monitor.
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
## 2018-11-11 NOTE — Progress Notes (Signed)
VAST RN consulted to place a PIV for blood transfusion. Upon assessing patient, noted lower left arm to be swollen up to elbow where previous PIV infiltrated during contrast infusion. Pt stated he has been keeping it elevated and applying ice as his unit RN had suggested.  VAST RN assessed right arm for second PIV site. Used [place] to place a long PIV in the right anterior forearm next to other IV. Due to proximity of sites, dressing of new IV overlapping initial IV dressing. However,  insertion site itself is not obstructed from view/assessment. Reported to unit RN.
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
## 2018-11-11 NOTE — Discharge Instructions (Addendum)
[facility]      [facility] °[phone] ° °OPEN ABDOMINAL SURGERY: POST OP INSTRUCTIONS ° °Always review your discharge instruction sheet given to you by the facility where your surgery was performed. ° °IF YOU HAVE DISABILITY OR FAMILY LEAVE FORMS, YOU MUST BRING THEM TO THE OFFICE FOR PROCESSING.  PLEASE DO NOT GIVE THEM TO YOUR DOCTOR. ° °1. A prescription for pain medication may be given to you upon discharge.  Take your pain medication as prescribed, if needed.  If narcotic pain medicine is not needed, then you may take acetaminophen (Tylenol) or ibuprofen (Advil) as needed. °2. Take your usually prescribed medications unless otherwise directed. °3. If you need a refill on your pain medication, please contact your pharmacy. They will contact our office to request authorization.  Prescriptions will not be filled after 5pm or on week-ends. °4. You should follow a light diet the first few days after arrival home, such as soup and crackers, pudding, etc.unless your doctor has advised otherwise. A high-fiber, low fat diet can be resumed as tolerated.   Be sure to include lots of fluids daily. Most patients will experience some swelling and bruising on the chest and neck area.  Ice packs will help.  Swelling and bruising can take several days to resolve °5. Most patients will experience some swelling and bruising in the area of the incision. Ice pack will help. Swelling and bruising can take several days to resolve..  °6. It is common to experience some constipation if taking pain medication after surgery.  Increasing fluid intake and taking a stool softener will usually help or prevent this problem from occurring.  A mild laxative (Milk of Magnesia or Miralax) should be taken according to package directions if there are no bowel movements after 48 hours. °7.  You may have steri-strips (small skin tapes) in place directly over the incision.  These strips should be left on the skin for 7-10 days.  If your  surgeon used skin glue on the incision, you may shower in 24 hours.  The glue will flake off over the next 2-3 weeks.  Any sutures or staples will be removed at the office during your follow-up visit. You may find that a light gauze bandage over your incision may keep your staples from being rubbed or pulled. You may shower and replace the bandage daily. °8. ACTIVITIES:  You may resume regular (light) daily activities beginning the next day--such as daily self-care, walking, climbing stairs--gradually increasing activities as tolerated.  You may have sexual intercourse when it is comfortable.  Refrain from any heavy lifting or straining until approved by your doctor. °a. You may drive when you no longer are taking prescription pain medication, you can comfortably wear a seatbelt, and you can safely maneuver your car and apply brakes °b. Return to Work: ___________________________________ °9. You should see your doctor in the office for a follow-up appointment approximately two weeks after your surgery.  Make sure that you call for this appointment within a day or two after you arrive home to insure a convenient appointment time. °OTHER INSTRUCTIONS:  °_____________________________________________________________ °_____________________________________________________________ ° °WHEN TO CALL YOUR DOCTOR: °1. Fever over 101.0 °2. Inability to urinate °3. Nausea and/or vomiting °4. Extreme swelling or bruising °5. Continued bleeding from incision. °6. Increased pain, redness, or drainage from the incision. °7. Difficulty swallowing or breathing °8. Muscle cramping or spasms. °9. Numbness or tingling in hands or feet or around lips. ° °The clinic staff is available to   answer your questions during regular business hours.  Please dont hesitate to call and ask to speak to one of the nurses if you have concerns.  For further questions, please visit [email or web address]    Pneumothorax A pneumothorax is  commonly called a collapsed lung. It is a condition in which air leaks from a lung and builds up between the thin layer of tissue that covers the lungs (visceral pleura) and the interior wall of the chest cavity (parietal pleura). The air gets trapped outside the lung, between the lung and the chest wall (pleural space). The air takes up space and prevents the lung from fully expanding. This condition sometimes occurs suddenly with no apparent cause. The buildup of air may be small or large. A small pneumothorax may go away on its own. A large pneumothorax will require treatment and hospitalization. What are the causes? This condition may be caused by:  Trauma and injury to the chest wall.  Surgery and other medical procedures.  A complication of an underlying lung problem, especially chronic obstructive pulmonary disease (COPD) or emphysema. Sometimes the cause of this condition is not known. What increases the risk? You are more likely to develop this condition if:  You have an underlying lung problem.  You smoke.  You are 36-25 years old, male, tall, and underweight.  You have a personal or family history of pneumothorax.  You have an eating disorder (anorexia nervosa). This condition can also happen quickly, even in people with no history of lung problems. What are the signs or symptoms? Sometimes a pneumothorax will have no symptoms. When symptoms are present, they can include:  Chest pain.  Shortness of breath.  Increased rate of breathing.  Bluish color to your lips or skin (cyanosis). How is this diagnosed? This condition may be diagnosed by:  A medical history and physical exam.  A chest X-ray, chest CT scan, or ultrasound. How is this treated? Treatment depends on how severe your condition is. The goal of treatment is to remove the extra air and allow your lung to expand back to its normal size.  For a small pneumothorax: ? No treatment may be needed. ? Extra  oxygen is sometimes used to make it go away more quickly.  For a large pneumothorax or a pneumothorax that is causing symptoms, a procedure is done to drain the air from your lungs. To do this, a health care provider may use: ? A needle with a syringe. This is used to suck air from a pleural space where no additional leakage is taking place. ? A chest tube. This is used to suck air where there is ongoing leakage into the pleural space. The chest tube may need to remain in place for several days until the air leak has healed.  In more severe cases, surgery may be needed to repair the damage that is causing the leak.  If you have multiple pneumothorax episodes or have an air leak that will not heal, a procedure called a pleurodesis may be done. A medicine is placed in the pleural space to irritate the tissues around the lung so that the lung will stick to the chest wall, seal any leaks, and stop any buildup of air in that space. If you have an underlying lung problem, severe symptoms, or a large pneumothorax you will usually need to stay in the hospital. Follow these instructions at home: Lifestyle  Do not use any products that contain nicotine or tobacco, such as cigarettes and e-cigarettes.  These are major risk factors in pneumothorax. If you need help quitting, ask your health care provider.  Do not lift anything that is heavier than 10 lb (4.5 kg), or the limit that your health care provider tells you, until he or she says that it is safe.  Avoid activities that take a lot of effort (strenuous) for as long as told by your health care provider.  Return to your normal activities as told by your health care provider. Ask your health care provider what activities are safe for you.  Do not fly in an airplane or scuba dive until your health care provider says it is okay. General instructions  Take over-the-counter and prescription medicines only as told by your health care provider.  If a cough  or pain makes it difficult for you to sleep at night, try sleeping in a semi-upright position in a recliner or by using 2 or 3 pillows.  If you had a chest tube and it was removed, ask your health care provider when you can remove the bandage (dressing). While the dressing is in place, do not allow it to get wet.  Keep all follow-up visits as told by your health care provider. This is important. Contact a health care provider if:  You cough up thick mucus (sputum) that is yellow or green in color.  You were treated with a chest tube, and you have redness, increasing pain, or discharge at the site where it was placed. Get help right away if:  You have increasing chest pain or shortness of breath.  You have a cough that will not go away.  You begin coughing up blood.  You have pain that is getting worse or is not controlled with medicines.  The site where your chest tube was located opens up.  You feel air coming out of the site where the chest tube was placed.  You have a fever or persistent symptoms for more than 2-3 days.  You have a fever and your symptoms suddenly get worse. These symptoms may represent a serious problem that is an emergency. Do not wait to see if the symptoms will go away. Get medical help right away. Call your local emergency services (911 in the [place] Do not drive yourself to the hospital. Summary  A pneumothorax, commonly called a collapsed lung, is a condition in which air leaks from a lung and gets trapped between the lung and the chest wall (pleural space).  The buildup of air may be small or large. A small pneumothorax may go away on its own. A large pneumothorax will require treatment and hospitalization.  Treatment for this condition depends on how severe the pneumothorax is. The goal of treatment is to remove the extra air and allow the lung to expand back to its normal size. This information is not intended to replace advice given to you by your  health care provider. Make sure you discuss any questions you have with your health care provider. Document Released: 07/24/2005 Document Revised: 07/02/2017 Document Reviewed: 07/02/2017 Elsevier Interactive Patient Education  2019 [employer].
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
## 2018-11-12 ENCOUNTER — Encounter (HOSPITAL_COMMUNITY): Payer: Self-pay | Admitting: Certified Registered"

## 2018-11-12 ENCOUNTER — Encounter (HOSPITAL_COMMUNITY)
Admission: EM | Disposition: A | Discharge status: Other Acute Inpatient Hospital | Payer: Self-pay | Source: Home / Self Care

## 2018-11-12 ENCOUNTER — Inpatient Hospital Stay (HOSPITAL_COMMUNITY): Payer: Medicaid Other | Admitting: Certified Registered"

## 2018-11-12 ENCOUNTER — Inpatient Hospital Stay (HOSPITAL_COMMUNITY): Payer: Medicaid Other

## 2018-11-12 DIAGNOSIS — J869 Pyothorax without fistula: Secondary | ICD-10-CM

## 2018-11-12 DIAGNOSIS — S27391A Other injuries of lung, unilateral, initial encounter: Secondary | ICD-10-CM

## 2018-11-12 DIAGNOSIS — X72XXXA Intentional self-harm by handgun discharge, initial encounter: Secondary | ICD-10-CM

## 2018-11-12 HISTORY — PX: VIDEO ASSISTED THORACOSCOPY (VATS)/EMPYEMA: SHX6172

## 2018-11-12 HISTORY — PX: DECORTICATION: SHX5101

## 2018-11-12 LAB — PREPARE RBC (CROSSMATCH)

## 2018-11-12 LAB — CBC
HCT: 24.4 % — ABNORMAL LOW (ref 39.0–52.0)
Hemoglobin: 8.3 g/dL — ABNORMAL LOW (ref 13.0–17.0)
MCH: 32.3 pg (ref 26.0–34.0)
MCHC: 34 g/dL (ref 30.0–36.0)
MCV: 94.9 fL (ref 80.0–100.0)
Platelets: 427 10*3/uL — ABNORMAL HIGH (ref 150–400)
RBC: 2.57 MIL/uL — ABNORMAL LOW (ref 4.22–5.81)
RDW: 14.3 % (ref 11.5–15.5)
WBC: 13.8 10*3/uL — ABNORMAL HIGH (ref 4.0–10.5)
nRBC: 0 % (ref 0.0–0.2)

## 2018-11-12 IMAGING — CR PORTABLE CHEST - 1 VIEW
1 series · 1 of 1 positions shown · non-contrast
Comparison: [DATE]

CLINICAL DATA: VATS, empyema

EXAM:
PORTABLE CHEST 1 VIEW

[AP]
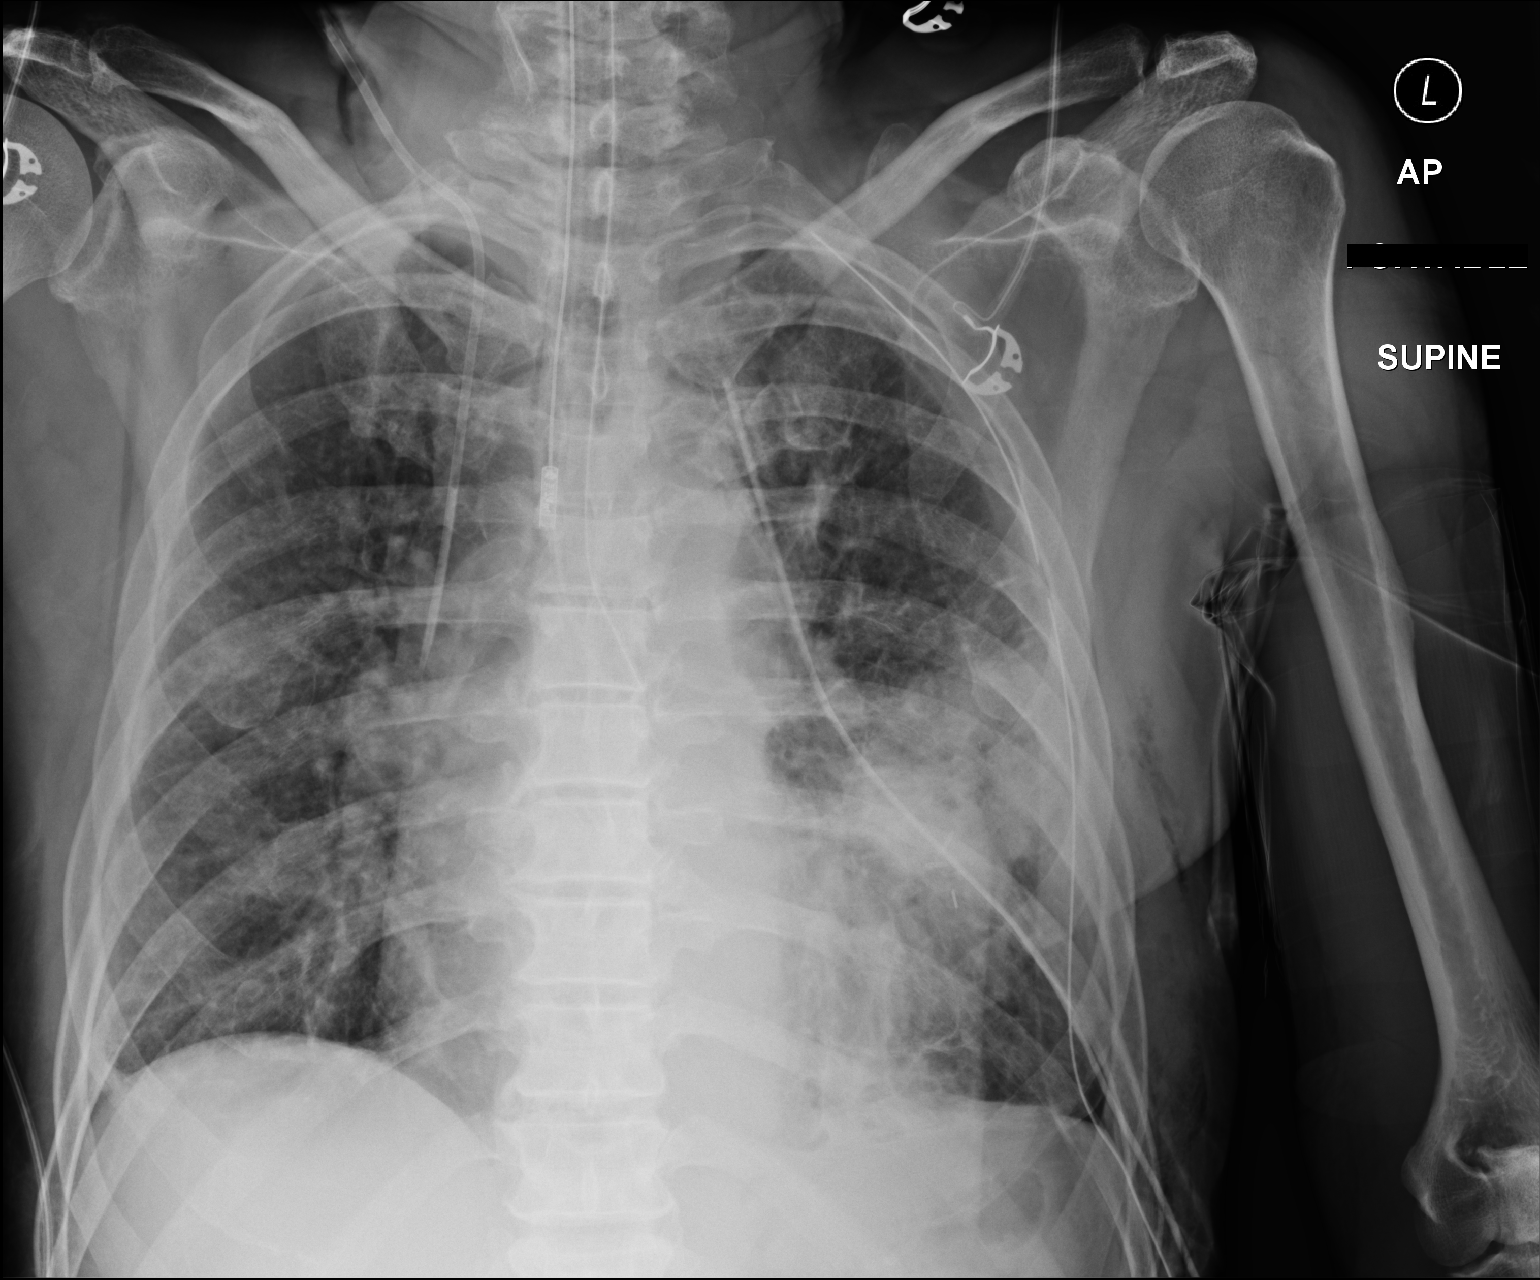

[1 of 1 positions shown; findings below may reference images not displayed]

FINDINGS: Left chest tubes remain in place. No visible pneumothorax. Mild
subcutaneous emphysema in the left chest wall. Airspace disease in
the left mid and lower lung again noted. This is unchanged. No
confluent opacity on the right. Heart is normal size.

Endotracheal tube is in the left mainstem bronchus. If tracheal
positioning is desired, retracting 5-6 cm is recommended. Right
central line is in place with the tip in the SVC.
IMPRESSION: Left chest tubes in place without pneumothorax. Continued left lower
lobe airspace disease, unchanged.

Left mainstem intubation. If tracheal positioning is desired, this
could be retracted 5-6 cm.

These results will be called to the ordering clinician or
representative by the Radiologist Assistant, and communication
documented in the PACS or zVision Dashboard.

## 2018-11-12 SURGERY — VIDEO ASSISTED THORACOSCOPY (VATS)/EMPYEMA
Anesthesia: General | Site: Chest | Laterality: Left

## 2018-11-12 MED ORDER — SODIUM CHLORIDE 0.9% FLUSH: 9.0000 mL | INTRAVENOUS | Status: DC | PRN

## 2018-11-12 MED ORDER — SODIUM CHLORIDE 0.9 % IV SOLN
1.5000 g | Freq: Two times a day (BID) | INTRAVENOUS | Status: DC
  Administered 2018-11-12: 1.5 g via INTRAVENOUS
  Filled 2018-11-12 (×4): qty 1.5

## 2018-11-12 MED ORDER — ACETAMINOPHEN 160 MG/5ML PO SOLN
1000.0000 mg | Freq: Four times a day (QID) | ORAL | Status: AC
  Administered 2018-11-12 (×2): 1000 mg via ORAL
  Filled 2018-11-12 (×2): qty 40.6

## 2018-11-12 MED ORDER — ONDANSETRON HCL 4 MG/2ML IJ SOLN: 4.0000 mg | Freq: Four times a day (QID) | INTRAMUSCULAR | Status: DC | PRN

## 2018-11-12 MED ORDER — ROCURONIUM BROMIDE 50 MG/5ML IV SOSY
PREFILLED_SYRINGE | INTRAVENOUS | Status: AC
  Filled 2018-11-12: qty 5

## 2018-11-12 MED ORDER — PROPOFOL 10 MG/ML IV BOLUS
INTRAVENOUS | Status: DC | PRN
  Administered 2018-11-12: 160 mg via INTRAVENOUS

## 2018-11-12 MED ORDER — SENNOSIDES-DOCUSATE SODIUM 8.6-50 MG PO TABS
1.0000 | ORAL_TABLET | Freq: Every day | ORAL | Status: DC
  Administered 2018-11-13 – 2018-11-28 (×11): 1 via ORAL
  Filled 2018-11-12 (×17): qty 1

## 2018-11-12 MED ORDER — ROCURONIUM BROMIDE 10 MG/ML (PF) SYRINGE
PREFILLED_SYRINGE | INTRAVENOUS | Status: DC | PRN
  Administered 2018-11-12: 20 mg via INTRAVENOUS
  Administered 2018-11-12: 50 mg via INTRAVENOUS

## 2018-11-12 MED ORDER — BISACODYL 5 MG PO TBEC
10.0000 mg | DELAYED_RELEASE_TABLET | Freq: Every day | ORAL | Status: DC
  Administered 2018-11-12 – 2018-11-17 (×5): 10 mg via ORAL
  Filled 2018-11-12 (×12): qty 2

## 2018-11-12 MED ORDER — CHLORHEXIDINE GLUCONATE CLOTH 2 % EX PADS
6.0000 | MEDICATED_PAD | Freq: Every day | CUTANEOUS | Status: DC
  Administered 2018-11-12 – 2018-11-13 (×2): 6 via TOPICAL

## 2018-11-12 MED ORDER — 0.9 % SODIUM CHLORIDE (POUR BTL) OPTIME
TOPICAL | Status: DC | PRN
  Administered 2018-11-12: 2000 mL

## 2018-11-12 MED ORDER — SERTRALINE HCL 100 MG PO TABS
100.0000 mg | ORAL_TABLET | Freq: Every day | ORAL | Status: DC
  Administered 2018-11-12 – 2018-12-09 (×27): 100 mg via ORAL
  Filled 2018-11-12 (×5): qty 1
  Filled 2018-11-12: qty 2
  Filled 2018-11-12 (×12): qty 1
  Filled 2018-11-12: qty 2
  Filled 2018-11-12 (×2): qty 1
  Filled 2018-11-12: qty 2
  Filled 2018-11-12 (×6): qty 1

## 2018-11-12 MED ORDER — HYDROMORPHONE 1 MG/ML IV SOLN
INTRAVENOUS | Status: DC
  Administered 2018-11-12: 25 mg via INTRAVENOUS
  Administered 2018-11-12: 0.6 mg via INTRAVENOUS
  Administered 2018-11-12: 2.1 mg via INTRAVENOUS
  Administered 2018-11-13: 0.6 mg via INTRAVENOUS
  Administered 2018-11-13: 1.5 mg via INTRAVENOUS
  Administered 2018-11-13: 1.2 mg via INTRAVENOUS
  Administered 2018-11-13 (×2): 0.9 mg via INTRAVENOUS
  Administered 2018-11-13: 1.2 mg via INTRAVENOUS
  Administered 2018-11-14: 1.5 mg via INTRAVENOUS
  Administered 2018-11-14: 0.6 mg via INTRAVENOUS
  Administered 2018-11-14: 0.9 mg via INTRAVENOUS
  Administered 2018-11-14: 1 mg via INTRAVENOUS
  Administered 2018-11-14: 0.6 mg via INTRAVENOUS
  Filled 2018-11-12: qty 25

## 2018-11-12 MED ORDER — ACETAMINOPHEN 500 MG PO TABS
1000.0000 mg | ORAL_TABLET | Freq: Four times a day (QID) | ORAL | Status: AC
  Administered 2018-11-13 – 2018-11-17 (×14): 1000 mg via ORAL
  Filled 2018-11-12 (×14): qty 2

## 2018-11-12 MED ORDER — POTASSIUM CHLORIDE 10 MEQ/50ML IV SOLN: 10.0000 meq | Freq: Every day | INTRAVENOUS | Status: DC | PRN

## 2018-11-12 MED ORDER — DEXMEDETOMIDINE HCL IN NACL 200 MCG/50ML IV SOLN
40.0000 ug | INTRAVENOUS | Status: DC
  Administered 2018-11-12: 11:00:00 40 ug via INTRAVENOUS
  Filled 2018-11-12: qty 50

## 2018-11-12 MED ORDER — MUPIROCIN 2 % EX OINT
1.0000 "application " | TOPICAL_OINTMENT | Freq: Two times a day (BID) | CUTANEOUS | Status: AC
  Administered 2018-11-12 – 2018-11-17 (×8): 1 via NASAL
  Filled 2018-11-12 (×3): qty 22

## 2018-11-12 MED ORDER — VANCOMYCIN HCL 1000 MG IV SOLR
INTRAVENOUS | Status: DC | PRN
  Administered 2018-11-12: 1000 mg

## 2018-11-12 MED ORDER — SUCCINYLCHOLINE CHLORIDE 200 MG/10ML IV SOSY
PREFILLED_SYRINGE | INTRAVENOUS | Status: DC | PRN
  Administered 2018-11-12: 160 mg via INTRAVENOUS

## 2018-11-12 MED ORDER — SUGAMMADEX SODIUM 200 MG/2ML IV SOLN
INTRAVENOUS | Status: DC | PRN
  Administered 2018-11-12: 200 mg via INTRAVENOUS

## 2018-11-12 MED ORDER — LIDOCAINE 2% (20 MG/ML) 5 ML SYRINGE
INTRAMUSCULAR | Status: AC
  Filled 2018-11-12: qty 5

## 2018-11-12 MED ORDER — SODIUM CHLORIDE 0.9% IV SOLUTION: Freq: Once | INTRAVENOUS | Status: DC

## 2018-11-12 MED ORDER — OXYCODONE HCL 5 MG/5ML PO SOLN: 5.0000 mg | Freq: Once | ORAL | Status: DC | PRN

## 2018-11-12 MED ORDER — VANCOMYCIN HCL 1000 MG IV SOLR
INTRAVENOUS | Status: AC
  Filled 2018-11-12: qty 1000

## 2018-11-12 MED ORDER — DEXAMETHASONE SODIUM PHOSPHATE 10 MG/ML IJ SOLN
INTRAMUSCULAR | Status: DC | PRN
  Administered 2018-11-12: 10 mg via INTRAVENOUS

## 2018-11-12 MED ORDER — CEFUROXIME SODIUM 1.5 G IV SOLR
1.5000 g | Freq: Three times a day (TID) | INTRAVENOUS | Status: DC
  Filled 2018-11-12: qty 1.5

## 2018-11-12 MED ORDER — DEXAMETHASONE SODIUM PHOSPHATE 10 MG/ML IJ SOLN
INTRAMUSCULAR | Status: AC
  Filled 2018-11-12: qty 1

## 2018-11-12 MED ORDER — DIPHENHYDRAMINE HCL 12.5 MG/5ML PO ELIX: 12.5000 mg | ORAL_SOLUTION | Freq: Four times a day (QID) | ORAL | Status: DC | PRN

## 2018-11-12 MED ORDER — ONDANSETRON HCL 4 MG/2ML IJ SOLN: 4.0000 mg | Freq: Once | INTRAMUSCULAR | Status: DC | PRN

## 2018-11-12 MED ORDER — FENTANYL CITRATE (PF) 100 MCG/2ML IJ SOLN
INTRAMUSCULAR | Status: AC
  Filled 2018-11-12: qty 2

## 2018-11-12 MED ORDER — LACTATED RINGERS IV SOLN
INTRAVENOUS | Status: DC | PRN
  Administered 2018-11-12 (×3): via INTRAVENOUS

## 2018-11-12 MED ORDER — FENTANYL CITRATE (PF) 100 MCG/2ML IJ SOLN
25.0000 ug | INTRAMUSCULAR | Status: DC | PRN
  Administered 2018-11-12 (×3): 50 ug via INTRAVENOUS

## 2018-11-12 MED ORDER — ONDANSETRON HCL 4 MG/2ML IJ SOLN
INTRAMUSCULAR | Status: AC
  Filled 2018-11-12: qty 2

## 2018-11-12 MED ORDER — OXYCODONE HCL 5 MG PO TABS: 5.0000 mg | ORAL_TABLET | Freq: Once | ORAL | Status: DC | PRN

## 2018-11-12 MED ORDER — SUFENTANIL CITRATE 50 MCG/ML IV SOLN
INTRAVENOUS | Status: AC
  Filled 2018-11-12: qty 1

## 2018-11-12 MED ORDER — ONDANSETRON HCL 4 MG/2ML IJ SOLN
INTRAMUSCULAR | Status: DC | PRN
  Administered 2018-11-12: 4 mg via INTRAVENOUS

## 2018-11-12 MED ORDER — MIDAZOLAM HCL 5 MG/5ML IJ SOLN
INTRAMUSCULAR | Status: DC | PRN
  Administered 2018-11-12 (×2): 1 mg via INTRAVENOUS

## 2018-11-12 MED ORDER — SUFENTANIL CITRATE 50 MCG/ML IV SOLN
INTRAVENOUS | Status: DC | PRN
  Administered 2018-11-12: 5 ug via INTRAVENOUS
  Administered 2018-11-12 (×2): 10 ug via INTRAVENOUS
  Administered 2018-11-12: 15 ug via INTRAVENOUS
  Administered 2018-11-12 (×2): 10 ug via INTRAVENOUS

## 2018-11-12 MED ORDER — MIDAZOLAM HCL 2 MG/2ML IJ SOLN
INTRAMUSCULAR | Status: AC
  Filled 2018-11-12: qty 2

## 2018-11-12 MED ORDER — KETOROLAC TROMETHAMINE 30 MG/ML IJ SOLN
INTRAMUSCULAR | Status: DC | PRN
  Administered 2018-11-12: 30 mg via INTRAVENOUS

## 2018-11-12 MED ORDER — PROPOFOL 10 MG/ML IV BOLUS
INTRAVENOUS | Status: AC
  Filled 2018-11-12: qty 20

## 2018-11-12 MED ORDER — NALOXONE HCL 0.4 MG/ML IJ SOLN: 0.4000 mg | INTRAMUSCULAR | Status: DC | PRN

## 2018-11-12 MED ORDER — KETOROLAC TROMETHAMINE 30 MG/ML IJ SOLN
INTRAMUSCULAR | Status: AC
  Filled 2018-11-12: qty 1

## 2018-11-12 MED ORDER — DIPHENHYDRAMINE HCL 50 MG/ML IJ SOLN: 12.5000 mg | Freq: Four times a day (QID) | INTRAMUSCULAR | Status: DC | PRN

## 2018-11-12 MED ORDER — SODIUM CHLORIDE (PF) 0.9 % IJ SOLN
INTRAMUSCULAR | Status: AC
  Filled 2018-11-12: qty 10

## 2018-11-12 SURGICAL SUPPLY — 73 items
ADH SKN CLS APL DERMABOND .7 (GAUZE/BANDAGES/DRESSINGS)
BAG DECANTER FOR FLEXI CONT (MISCELLANEOUS) IMPLANT
BLADE SURG 11 STRL SS (BLADE) ×2 IMPLANT
CANISTER SUCT 3000ML PPV (MISCELLANEOUS) ×4 IMPLANT
CATH KIT ON Q 5IN SLV (PAIN MANAGEMENT) IMPLANT
CATH ROBINSON RED A/P 22FR (CATHETERS) IMPLANT
CATH THORACIC 28FR (CATHETERS) IMPLANT
CATH THORACIC 36FR (CATHETERS) IMPLANT
CATH THORACIC 36FR RT ANG (CATHETERS) IMPLANT
CLIP VESOCCLUDE MED 24/CT (CLIP) ×2 IMPLANT
CONN ST 1/4X3/8  BEN (MISCELLANEOUS) ×2
CONN ST 1/4X3/8 BEN (MISCELLANEOUS) IMPLANT
CONT SPEC 4OZ CLIKSEAL STRL BL (MISCELLANEOUS) ×10 IMPLANT
COVER WAND RF STERILE (DRAPES) ×4 IMPLANT
DERMABOND ADVANCED (GAUZE/BANDAGES/DRESSINGS)
DERMABOND ADVANCED .7 DNX12 (GAUZE/BANDAGES/DRESSINGS) IMPLANT
DRAIN CHANNEL 32F RND 10.7 FF (WOUND CARE) ×2 IMPLANT
DRAPE LAPAROSCOPIC ABDOMINAL (DRAPES) ×4 IMPLANT
DRAPE SLUSH/WARMER DISC (DRAPES) ×2 IMPLANT
DRAPE WARM FLUID 44X44 (DRAPE) ×2 IMPLANT
ELECT BLADE 4.0 EZ CLEAN MEGAD (MISCELLANEOUS) ×4
ELECT BLADE 6.5 EXT (BLADE) ×2 IMPLANT
ELECT REM PT RETURN 9FT ADLT (ELECTROSURGICAL) ×4
ELECTRODE BLDE 4.0 EZ CLN MEGD (MISCELLANEOUS) IMPLANT
ELECTRODE REM PT RTRN 9FT ADLT (ELECTROSURGICAL) ×2 IMPLANT
GAUZE SPONGE 4X4 12PLY STRL (GAUZE/BANDAGES/DRESSINGS) ×4 IMPLANT
GAUZE SPONGE 4X4 12PLY STRL LF (GAUZE/BANDAGES/DRESSINGS) ×2 IMPLANT
GOWN STRL REUS W/ TWL LRG LVL3 (GOWN DISPOSABLE) ×6 IMPLANT
GOWN STRL REUS W/TWL LRG LVL3 (GOWN DISPOSABLE) ×16
KIT BASIN OR (CUSTOM PROCEDURE TRAY) ×4 IMPLANT
KIT SUCTION CATH 14FR (SUCTIONS) ×4 IMPLANT
KIT TURNOVER KIT B (KITS) ×4 IMPLANT
NS IRRIG 1000ML POUR BTL (IV SOLUTION) ×14 IMPLANT
PACK CHEST (CUSTOM PROCEDURE TRAY) ×4 IMPLANT
PAD ABD 8X10 STRL (GAUZE/BANDAGES/DRESSINGS) ×2 IMPLANT
PAD ARMBOARD 7.5X6 YLW CONV (MISCELLANEOUS) ×8 IMPLANT
SEALANT SURG COSEAL 4ML (VASCULAR PRODUCTS) ×2 IMPLANT
SEALANT SURG COSEAL 8ML (VASCULAR PRODUCTS) ×4 IMPLANT
SOLUTION ANTI FOG 6CC (MISCELLANEOUS) ×4 IMPLANT
SPONGE TONSIL 1.25 RF SGL STRG (GAUZE/BANDAGES/DRESSINGS) ×4 IMPLANT
SUT CHROMIC 3 0 SH 27 (SUTURE) IMPLANT
SUT CHROMIC 4 0 SH 27 (SUTURE) ×8 IMPLANT
SUT ETHILON 3 0 FSL (SUTURE) ×2 IMPLANT
SUT ETHILON 3 0 PS 1 (SUTURE) IMPLANT
SUT PROLENE 3 0 SH DA (SUTURE) IMPLANT
SUT PROLENE 4 0 RB 1 (SUTURE)
SUT PROLENE 4 0 SH DA (SUTURE) ×2 IMPLANT
SUT PROLENE 4-0 RB1 .5 CRCL 36 (SUTURE) IMPLANT
SUT PROLENE 6 0 C 1 30 (SUTURE) IMPLANT
SUT SILK  1 MH (SUTURE) ×4
SUT SILK 1 MH (SUTURE) ×4 IMPLANT
SUT SILK 1 TIES 10X30 (SUTURE) IMPLANT
SUT SILK 2 0SH CR/8 30 (SUTURE) IMPLANT
SUT SILK 3 0SH CR/8 30 (SUTURE) IMPLANT
SUT VIC AB 1 CTX 18 (SUTURE) ×6 IMPLANT
SUT VIC AB 2 TP1 27 (SUTURE) ×2 IMPLANT
SUT VIC AB 2-0 CT2 18 VCP726D (SUTURE) IMPLANT
SUT VIC AB 2-0 CTX 36 (SUTURE) IMPLANT
SUT VIC AB 3-0 SH 18 (SUTURE) IMPLANT
SUT VIC AB 3-0 SH 8-18 (SUTURE) ×2 IMPLANT
SUT VIC AB 3-0 X1 27 (SUTURE) ×2 IMPLANT
SUT VICRYL 0 UR6 27IN ABS (SUTURE) IMPLANT
SUT VICRYL 2 TP 1 (SUTURE) ×2 IMPLANT
SWAB COLLECTION DEVICE MRSA (MISCELLANEOUS) ×2 IMPLANT
SWAB CULTURE ESWAB REG 1ML (MISCELLANEOUS) IMPLANT
SYSTEM SAHARA CHEST DRAIN ATS (WOUND CARE) ×4 IMPLANT
TAPE CLOTH SURG 4X10 WHT LF (GAUZE/BANDAGES/DRESSINGS) ×4 IMPLANT
TIP APPLICATOR SPRAY EXTEND 16 (VASCULAR PRODUCTS) IMPLANT
TOWEL GREEN STERILE (TOWEL DISPOSABLE) ×4 IMPLANT
TOWEL GREEN STERILE FF (TOWEL DISPOSABLE) ×4 IMPLANT
TRAP SPECIMEN MUCOUS 40CC (MISCELLANEOUS) IMPLANT
TRAY FOLEY MTR SLVR 16FR STAT (SET/KITS/TRAYS/PACK) ×4 IMPLANT
WATER STERILE IRR 1000ML POUR (IV SOLUTION) ×8 IMPLANT
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
## 2018-11-12 NOTE — Anesthesia Procedure Notes (Signed)
Procedure Name: Intubation Date/Time: 11/12/2018 7:54 AM Performed by: Moshe Salisbury, CRNA Pre-anesthesia Checklist: Patient identified, Emergency Drugs available, Suction available and Patient being monitored Patient Re-evaluated:Patient Re-evaluated prior to induction Oxygen Delivery Method: Circle System Utilized Preoxygenation: Pre-oxygenation with 100% oxygen Induction Type: IV induction and Rapid sequence Laryngoscope Size: Mac and 4 Grade View: Grade II Endobronchial tube: Left, Double lumen EBT, Bronchial Blocker placed under direct vision and EBT position confirmed by auscultation and 39 Fr Number of attempts: 1 Airway Equipment and Method: Stylet Placement Confirmation: ETT inserted through vocal cords under direct vision,  positive ETCO2 and breath sounds checked- equal and bilateral Tube secured with: Tape Dental Injury: Teeth and Oropharynx as per pre-operative assessment

## 2018-11-12 NOTE — Anesthesia Procedure Notes (Signed)
Central Venous Catheter Insertion Performed by: Annye Asa, MD, anesthesiologist Start/End4/02/2019 6:37 AM, 11/12/2018 6:49 AM Patient location: Pre-op. Preanesthetic checklist: patient identified, IV checked, risks and benefits discussed, surgical consent, monitors and equipment checked, pre-op evaluation, timeout performed and anesthesia consent Position: supine Lidocaine 1% used for infiltration and patient sedated Hand hygiene performed , maximum sterile barriers used  and Seldinger technique used Catheter size: 8 Fr Total catheter length 16. Central line was placed.Double lumen Procedure performed using ultrasound guided technique. Ultrasound Notes:anatomy identified, needle tip was noted to be adjacent to the nerve/plexus identified, no ultrasound evidence of intravascular and/or intraneural injection and image(s) printed for medical record Attempts: 1 Following insertion, line sutured, dressing applied and Biopatch. Post procedure assessment: blood return through all ports, free fluid flow and no air  Patient tolerated the procedure well with no immediate complications. Additional procedure comments: CVP: Timeout, sterile prep, drape, FBP R neck.  Supine position.  1% lido local, finder and trocar RIJ 1st pass with US guidance.  2 lumen placed over J wire. Biopatch and sterile dressing on.  Patient tolerated well.  VSS.  Jenita Seashore, MD.
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
## 2018-11-12 NOTE — Anesthesia Postprocedure Evaluation (Signed)
Anesthesia Post Note  Patient: [name]  Procedure(s) Performed: VIDEO ASSISTED THORACOSCOPY (VATS)/EMPYEMA, MINI THORACOTOMY (Left Chest) DECORTICATION (Left )     Patient location during evaluation: PACU Anesthesia Type: General Level of consciousness: awake and alert Pain management: pain level controlled Vital Signs Assessment: post-procedure vital signs reviewed and stable Respiratory status: spontaneous breathing, nonlabored ventilation, respiratory function stable and patient connected to nasal cannula oxygen Cardiovascular status: blood pressure returned to baseline and stable Postop Assessment: no apparent nausea or vomiting Anesthetic complications: no    Last Vitals:  Vitals:   11/12/18 1600 11/12/18 1700  BP: 128/86 124/79  Pulse: (!) 56 (!) 52  Resp: 11 17  Temp: (!) 36.4 C   SpO2: 99% 100%    Last Pain:  Vitals:   11/12/18 1600  TempSrc: Oral  PainSc:                  Kenroy Timberman COKER

## 2018-11-12 NOTE — Anesthesia Procedure Notes (Signed)
Arterial Line Insertion Start/End4/02/2019 7:00 AM, 11/12/2018 7:10 AM Performed by: Moshe Salisbury, CRNA, CRNA  Patient location: Pre-op. Preanesthetic checklist: patient identified, IV checked, site marked, risks and benefits discussed, surgical consent, monitors and equipment checked, pre-op evaluation, timeout performed and anesthesia consent Lidocaine 1% used for infiltration and patient sedated Left, radial was placed Catheter size: 20 G Hand hygiene performed , maximum sterile barriers used  and Seldinger technique used Allen's test indicative of satisfactory collateral circulation Attempts: 1 Procedure performed without using ultrasound guided technique. Following insertion, dressing applied and Biopatch. Post procedure assessment: normal  Patient tolerated the procedure well with no immediate complications.
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
## 2018-11-12 NOTE — Progress Notes (Signed)
Pre Procedure note for inpatients:   [name] has been scheduled for Procedure(s) with comments: VIDEO ASSISTED THORACOSCOPY (VATS)/EMPYEMA (Left) - needs cell saver DECORTICATION (Left) today. The various methods of treatment have been discussed with the patient. After consideration of the risks, benefits and treatment options the patient has consented to the planned procedure.   The patient has been seen and labs reviewed. There are no changes in the patient's condition to prevent proceeding with the planned procedure today.  Recent labs:  Lab Results  Component Value Date   WBC 13.8 (H) 11/12/2018   HGB 8.3 (L) 11/12/2018   HCT 24.4 (L) 11/12/2018   PLT 427 (H) 11/12/2018   GLUCOSE 134 (H) 11/11/2018   ALT 48 (H) 11/11/2018   AST 87 (H) 11/11/2018   NA 131 (L) 11/11/2018   K 4.2 11/11/2018   CL 100 11/11/2018   CREATININE 0.93 11/11/2018   BUN 8 11/11/2018   CO2 21 (L) 11/11/2018   TSH 26.177 (H) 11/11/2018   INR 1.3 (H) 11/11/2018    Len Childs, MD 11/12/2018 7:26 AM

## 2018-11-12 NOTE — Anesthesia Preprocedure Evaluation (Signed)
Anesthesia Evaluation  Patient identified by MRN, date of birth, ID band Patient awake    Reviewed: Allergy & Precautions, NPO status , Patient's Chart, lab work & pertinent test results  Airway Mallampati: II  TM Distance: >3 FB Neck ROM: Full    Dental  (+) Teeth Intact, Dental Advisory Given   Pulmonary Current Smoker,    breath sounds clear to auscultation+ rhonchi  + decreased breath sounds      Cardiovascular hypertension,  Rhythm:Regular Rate:Normal     Neuro/Psych    GI/Hepatic   Endo/Other    Renal/GU      Musculoskeletal   Abdominal   Peds  Hematology   Anesthesia Other Findings   Reproductive/Obstetrics                             Anesthesia Physical Anesthesia Plan  ASA: III  Anesthesia Plan: General   Post-op Pain Management:    Induction: Intravenous  PONV Risk Score and Plan: Ondansetron and Dexamethasone  Airway Management Planned: Double Lumen EBT  Additional Equipment:   Intra-op Plan:   Post-operative Plan: Extubation in OR  Informed Consent: I have reviewed the patients History and Physical, chart, labs and discussed the procedure including the risks, benefits and alternatives for the proposed anesthesia with the patient or authorized representative who has indicated his/her understanding and acceptance.     Dental advisory given  Plan Discussed with: CRNA and Anesthesiologist  Anesthesia Plan Comments:         Anesthesia Quick Evaluation

## 2018-11-12 NOTE — Transfer of Care (Signed)
Immediate Anesthesia Transfer of Care Note  Patient: [name]  Procedure(s) Performed: VIDEO ASSISTED THORACOSCOPY (VATS)/EMPYEMA, MINI THORACOTOMY (Left Chest) DECORTICATION (Left )  Patient Location: PACU  Anesthesia Type:General  Level of Consciousness: awake and patient cooperative  Airway & Oxygen Therapy: Patient Spontanous Breathing and Patient connected to nasal cannula oxygen  Post-op Assessment: Report given to RN, Post -op Vital signs reviewed and stable and Patient moving all extremities  Post vital signs: Reviewed and stable  Last Vitals:  Vitals Value Taken Time  BP 144/83 11/12/2018 10:25 AM  Temp    Pulse 60 11/12/2018 10:30 AM  Resp 25 11/12/2018 10:30 AM  SpO2 89 % 11/12/2018 10:30 AM  Vitals shown include unvalidated device data.  Last Pain:  Vitals:   11/12/18 0357  TempSrc: Oral  PainSc:       Patients Stated Pain Goal: 2 (88/82/80 0349)  Complications: No apparent anesthesia complications

## 2018-11-13 ENCOUNTER — Inpatient Hospital Stay (HOSPITAL_COMMUNITY): Payer: Medicaid Other

## 2018-11-13 ENCOUNTER — Encounter (HOSPITAL_COMMUNITY): Payer: Self-pay | Admitting: Cardiothoracic Surgery

## 2018-11-13 LAB — CBC
HCT: 23.9 % — ABNORMAL LOW (ref 39.0–52.0)
Hemoglobin: 8.1 g/dL — ABNORMAL LOW (ref 13.0–17.0)
MCH: 32.5 pg (ref 26.0–34.0)
MCHC: 33.9 g/dL (ref 30.0–36.0)
MCV: 96 fL (ref 80.0–100.0)
Platelets: 465 10*3/uL — ABNORMAL HIGH (ref 150–400)
RBC: 2.49 MIL/uL — ABNORMAL LOW (ref 4.22–5.81)
RDW: 14.3 % (ref 11.5–15.5)
WBC: 25 10*3/uL — ABNORMAL HIGH (ref 4.0–10.5)
nRBC: 0 % (ref 0.0–0.2)

## 2018-11-13 LAB — BLOOD GAS, ARTERIAL
Acid-base deficit: 0.7 mmol/L (ref 0.0–2.0)
Bicarbonate: 23.2 mmol/L (ref 20.0–28.0)
Drawn by: 40415
O2 Content: 2 L/min
O2 Saturation: 96.5 %
Patient temperature: 98.6
pCO2 arterial: 36.8 mmHg (ref 32.0–48.0)
pH, Arterial: 7.416 (ref 7.350–7.450)
pO2, Arterial: 83.4 mmHg (ref 83.0–108.0)

## 2018-11-13 LAB — BASIC METABOLIC PANEL
Anion gap: 5 (ref 5–15)
BUN: 17 mg/dL (ref 6–20)
CO2: 21 mmol/L — ABNORMAL LOW (ref 22–32)
Calcium: 8.3 mg/dL — ABNORMAL LOW (ref 8.9–10.3)
Chloride: 105 mmol/L (ref 98–111)
Creatinine, Ser: 0.89 mg/dL (ref 0.61–1.24)
GFR calc Af Amer: >60 mL/min (ref >60)
GFR calc non Af Amer: >60 mL/min (ref >60)
Glucose, Bld: 119 mg/dL — ABNORMAL HIGH (ref 70–99)
Potassium: 4.1 mmol/L (ref 3.5–5.1)
Sodium: 131 mmol/L — ABNORMAL LOW (ref 135–145)

## 2018-11-13 LAB — ACID FAST SMEAR (AFB, MYCOBACTERIA): Acid Fast Smear: NEGATIVE
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
## 2018-11-13 MED ORDER — VITAMIN B-1 100 MG PO TABS
100.0000 mg | ORAL_TABLET | Freq: Every day | ORAL | Status: DC
  Administered 2018-11-14 – 2018-12-09 (×25): 100 mg via ORAL
  Filled 2018-11-13 (×26): qty 1

## 2018-11-13 MED ORDER — AMLODIPINE BESYLATE 5 MG PO TABS
5.0000 mg | ORAL_TABLET | Freq: Every day | ORAL | Status: DC
  Administered 2018-11-13 – 2018-12-09 (×24): 5 mg via ORAL
  Filled 2018-11-13 (×26): qty 1

## 2018-11-13 MED ORDER — PIPERACILLIN-TAZOBACTAM 3.375 G IVPB
3.3750 g | Freq: Three times a day (TID) | INTRAVENOUS | Status: DC
  Administered 2018-11-13 – 2018-11-18 (×15): 3.375 g via INTRAVENOUS
  Filled 2018-11-13 (×20): qty 50

## 2018-11-13 MED ORDER — VANCOMYCIN HCL IN DEXTROSE 1-5 GM/200ML-% IV SOLN
1000.0000 mg | Freq: Two times a day (BID) | INTRAVENOUS | Status: DC
  Administered 2018-11-13 – 2018-11-17 (×10): 1000 mg via INTRAVENOUS
  Filled 2018-11-13 (×18): qty 200

## 2018-11-13 NOTE — Progress Notes (Addendum)
      [address]       [place] [address]             [phone]      1 Day Post-Op Procedure(s) (LRB): VIDEO ASSISTED THORACOSCOPY (VATS)/EMPYEMA, MINI THORACOTOMY (Left) DECORTICATION (Left) Subjective: Feels good this morning. He didn't sleep well last night.  Objective: Vital signs in last 24 hours: Temp:  [97 F (36.1 C)-98.7 F (37.1 C)] 98.7 F (37.1 C) (04/08 0750) Pulse Rate:  [52-78] 69 (04/08 0700) Cardiac Rhythm: Normal sinus rhythm (04/08 0400) Resp:  [10-59] 23 (04/08 0700) BP: (103-154)/(64-99) 140/84 (04/08 0700) SpO2:  [96 %-100 %] 97 % (04/08 0700) Arterial Line BP: (91-166)/(44-87) 135/84 (04/08 0700)     Intake/Output from previous day: 04/07 0701 - 04/08 0700 In: 2767 [I.V.:2139.7; IV Piggyback:627.3] Out: 1177 [Urine:850; Blood:50; Chest Tube:277] Intake/Output this shift: No intake/output data recorded.  General appearance: alert, cooperative and no distress Heart: regular rate and rhythm, S1, S2 normal, no murmur, click, rub or gallop Lungs: clear to auscultation bilaterally and diminished in the lower lobes Abdomen: soft, non-tender; bowel sounds normal; no masses,  no organomegaly Extremities: extremities normal, atraumatic, no cyanosis or edema Wound: clean and dry. Staples in place  Lab Results: Recent Labs    11/12/18 0320 11/13/18 0441  WBC 13.8* 25.0*  HGB 8.3* 8.1*  HCT 24.4* 23.9*  PLT 427* 465*   BMET:  Recent Labs    11/11/18 1355 11/13/18 0441  NA 131* 131*  K 4.2 4.1  CL 100 105  CO2 21* 21*  GLUCOSE 134* 119*  BUN 8 17  CREATININE 0.93 0.89  CALCIUM 8.4* 8.3*    PT/INR:  Recent Labs    11/11/18 1355  LABPROT 15.6*  INR 1.3*   ABG    Component Value Date/Time   PHART 7.416 11/13/2018 0420   HCO3 23.2 11/13/2018 0420   TCO2 20 (L) 11/03/2018 1807   ACIDBASEDEF 0.7 11/13/2018 0420   O2SAT 96.5 11/13/2018 0420   CBG (last 3)  No results for input(s): GLUCAP in the last 72 hours.  Assessment/Plan: S/P Procedure(s) (LRB): VIDEO ASSISTED THORACOSCOPY (VATS)/EMPYEMA, MINI THORACOTOMY (Left) DECORTICATION (Left)   1. CV-BP well controlled. NSR in the 60s. Continue PRN hydralazine. Will start home dose of Norvasc. Hold off on ARB. 2. Pulm- tolerating 2L [place] with good oxygenation. 277cc/24 hours. About 120cc over night. CXR appears stable. 3. Renal-creatinine 0.89, electrolytes okay 4. H and H stable 8.1/23.9, expected acute blood loss anemia 5. Thyroid-On synthroid, continue. 6. Continue Zinacef for another dose.   Plan: Discontinue arterial line, discontinue central line (peripherals in place), start Norvasc. Working on ambulation in the halls today. Plans to use his incentive spirometer. Keep chest tubes another day. Weaning oxygen as tolerated.    LOS: 10 days    Elgie Collard 11/13/2018 Intra-operative gram stain with gram neg rods- antibiotics changed to  vanc and Zosyn until cultures final Leave both tubes to suction patient examined and medical record reviewed,agree with above note. [name] 11/13/2018

## 2018-11-13 NOTE — Progress Notes (Signed)
CSW continues to follow patient for inpatient psych when medically stable.   [place], [place]
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
## 2018-11-13 NOTE — Progress Notes (Signed)
Pharmacy Antibiotic Note  [name] is a 58 y.o. male POD#1 VATS for left empyema.  Pharmacy has been consulted for Vancomycin and Zosyn dosing while pleural cultures are pending. GNR on gram stain, reincubated. Vanc and Zosyn given 4/6>4/7, then Cefuroxime given last night.    Plan:  Zosyn 3.375 gm IV q8hrs (each over 4 hours)  Vancomycin 1gm IV q12h  Goal AUC 400-550.  Expected AUC: 484  SCr used: 0.89  Follow renal function, final culture data, antibiotic plans.  Height: 5\' 8"  (172.7 cm) Weight: 164 lb (74.4 kg) IBW/kg (Calculated) : 68.4  Temp (24hrs), Avg:98 F (36.7 C), Min:97.5 F (36.4 C), Max:98.7 F (37.1 C)  Recent Labs  Lab 11/09/18 0220 11/10/18 0321 11/11/18 0504 11/11/18 1355 11/12/18 0320 11/13/18 0441  WBC 11.8* 9.3 10.9*  --  13.8* 25.0*  CREATININE  --   --   --  0.93  --  0.89    Estimated Creatinine Clearance: 87.5 mL/min (by C-G formula based on SCr of 0.89 mg/dL).    No Known Allergies  Antimicrobials this admission:  Zosyn 4/6>4/7; resume 4/8>>  Vanc 4/6>>4/7; resume 4/8  Cefuroxime 4/7>>4/8  Microbiology results:   4/7 pleural fluid (left), fungus smear - ng < 24 hrs to date   4/7 pleural fluid (left) - rare GNR, reincubated   4/6 surgical MRSA PCR - positive for Staph, negative for MRSA   4/2 MRSA PCR negative  Thank you for allowing pharmacy to be a part of this patient's care.  Arty Baumgartner, [place] Pager: [phone] or phone: [phone] 11/13/2018 11:29 AM
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
## 2018-11-14 ENCOUNTER — Inpatient Hospital Stay (HOSPITAL_COMMUNITY): Payer: Medicaid Other

## 2018-11-14 LAB — COMPREHENSIVE METABOLIC PANEL
ALT: 36 U/L (ref 0–44)
AST: 32 U/L (ref 15–41)
Albumin: 2.1 g/dL — ABNORMAL LOW (ref 3.5–5.0)
Alkaline Phosphatase: 83 U/L (ref 38–126)
Anion gap: 9 (ref 5–15)
BUN: 10 mg/dL (ref 6–20)
CO2: 24 mmol/L (ref 22–32)
Calcium: 8.3 mg/dL — ABNORMAL LOW (ref 8.9–10.3)
Chloride: 99 mmol/L (ref 98–111)
Creatinine, Ser: 0.87 mg/dL (ref 0.61–1.24)
GFR calc Af Amer: >60 mL/min (ref >60)
GFR calc non Af Amer: >60 mL/min (ref >60)
Glucose, Bld: 96 mg/dL (ref 70–99)
Potassium: 4.1 mmol/L (ref 3.5–5.1)
Sodium: 132 mmol/L — ABNORMAL LOW (ref 135–145)
Total Bilirubin: 0.7 mg/dL (ref 0.3–1.2)
Total Protein: 5.1 g/dL — ABNORMAL LOW (ref 6.5–8.1)

## 2018-11-14 LAB — CBC
HCT: 22.7 % — ABNORMAL LOW (ref 39.0–52.0)
Hemoglobin: 7.8 g/dL — ABNORMAL LOW (ref 13.0–17.0)
MCH: 32.9 pg (ref 26.0–34.0)
MCHC: 34.4 g/dL (ref 30.0–36.0)
MCV: 95.8 fL (ref 80.0–100.0)
Platelets: 521 10*3/uL — ABNORMAL HIGH (ref 150–400)
RBC: 2.37 MIL/uL — ABNORMAL LOW (ref 4.22–5.81)
RDW: 14.3 % (ref 11.5–15.5)
WBC: 19.8 10*3/uL — ABNORMAL HIGH (ref 4.0–10.5)
nRBC: 0 % (ref 0.0–0.2)
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
## 2018-11-15 ENCOUNTER — Inpatient Hospital Stay (HOSPITAL_COMMUNITY): Payer: Medicaid Other

## 2018-11-15 LAB — TYPE AND SCREEN
ABO/RH(D): A POS
Antibody Screen: NEGATIVE
Unit division: 0
Unit division: 0
Unit division: 0

## 2018-11-15 LAB — BPAM RBC
Blood Product Expiration Date: 202004122359
Blood Product Expiration Date: 202004182359
Blood Product Expiration Date: 202004232359
ISSUE DATE / TIME: 202004061803
ISSUE DATE / TIME: 202004070738
Unit Type and Rh: 6200
Unit Type and Rh: 6200
Unit Type and Rh: 6200

## 2018-11-15 LAB — CBC
HCT: 22.2 % — ABNORMAL LOW (ref 39.0–52.0)
Hemoglobin: 7.4 g/dL — ABNORMAL LOW (ref 13.0–17.0)
MCH: 32.3 pg (ref 26.0–34.0)
MCHC: 33.3 g/dL (ref 30.0–36.0)
MCV: 96.9 fL (ref 80.0–100.0)
Platelets: 487 10*3/uL — ABNORMAL HIGH (ref 150–400)
RBC: 2.29 MIL/uL — ABNORMAL LOW (ref 4.22–5.81)
RDW: 13.8 % (ref 11.5–15.5)
WBC: 16.3 10*3/uL — ABNORMAL HIGH (ref 4.0–10.5)
nRBC: 0 % (ref 0.0–0.2)

## 2018-11-15 IMAGING — DX PORTABLE CHEST - 1 VIEW
1 series · 1 of 1 positions shown · non-contrast
Comparison: [DATE]

CLINICAL DATA: Empyema and left chest tube.

EXAM:
PORTABLE CHEST 1 VIEW

[chest ap]
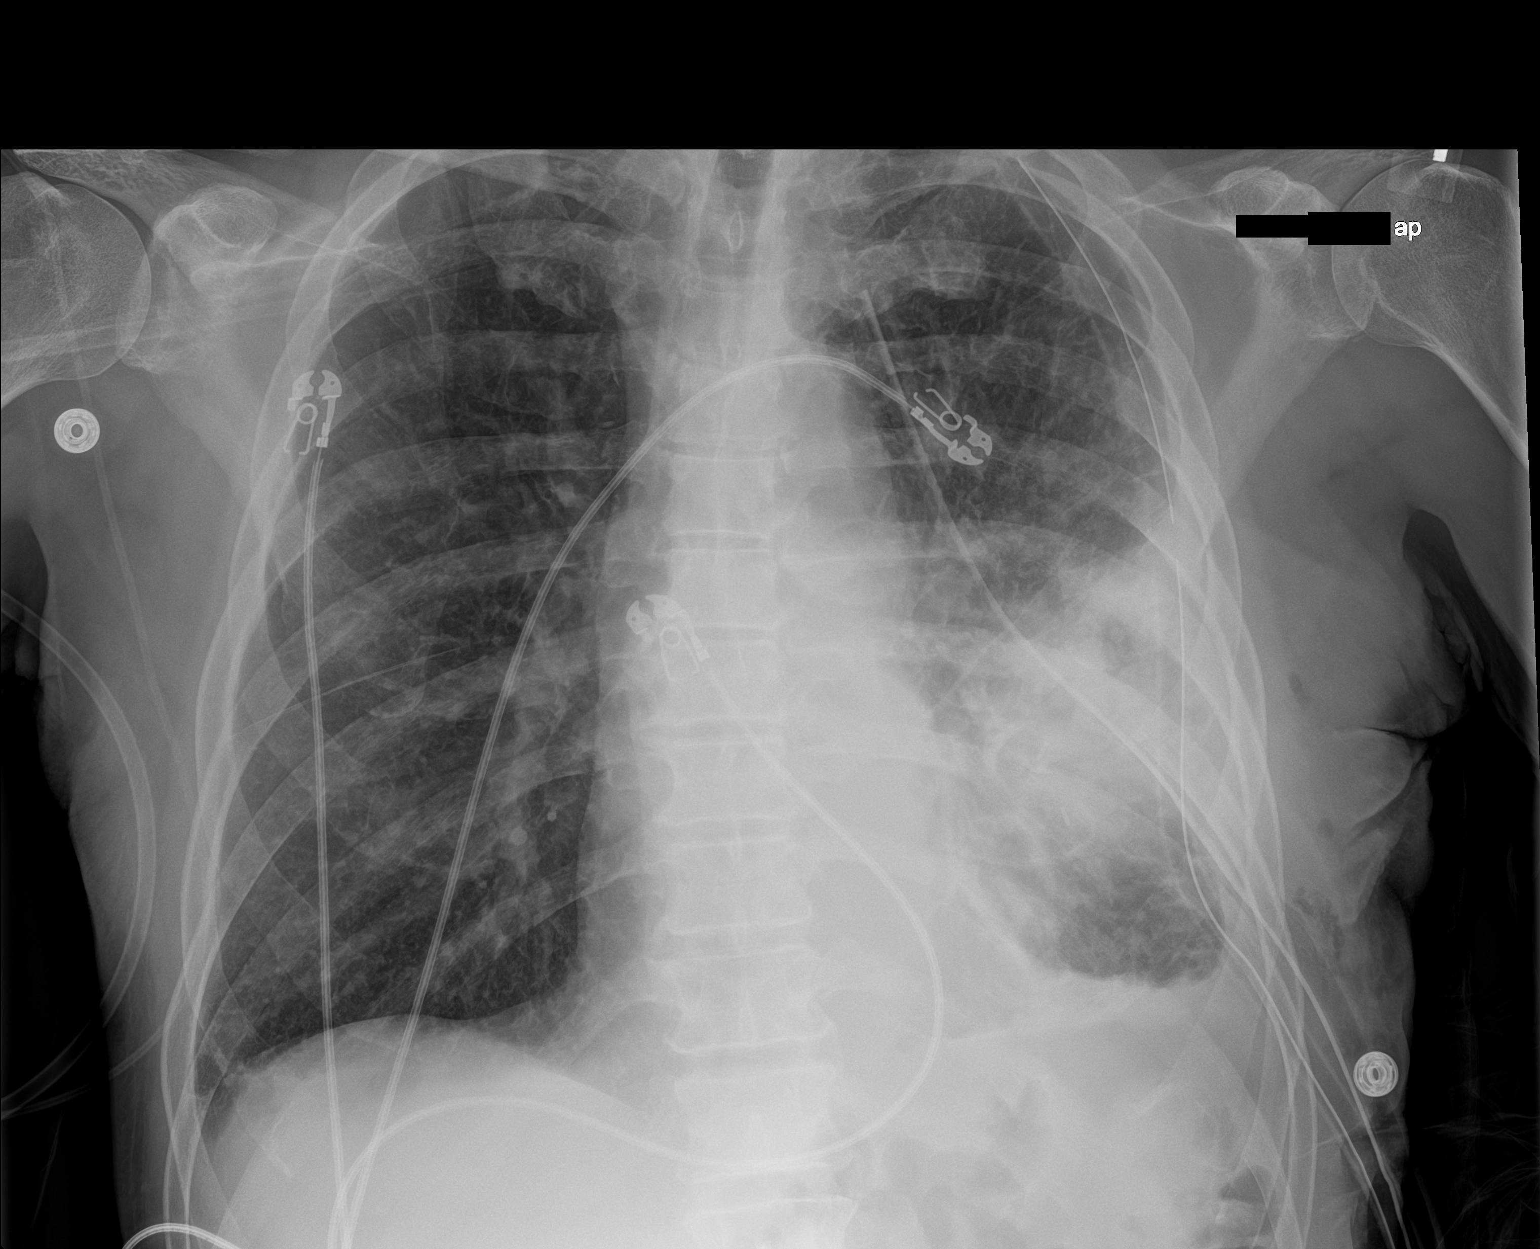

[1 of 1 positions shown; findings below may reference images not displayed]

FINDINGS: Two left chest tubes remain in place. Right chest remains clear.
Persistent parenchymal opacity in the left mid and lower lung as
seen previously. Persistent left pleural density as seen previously.
No worsening or new finding. No pleural air.
IMPRESSION: No change since yesterday. Two left chest tubes. Persistent
parenchymal and pleural density on the left.

## 2018-11-15 MED ORDER — HYDROMORPHONE HCL 1 MG/ML IJ SOLN
1.0000 mg | INTRAMUSCULAR | Status: DC | PRN
  Administered 2018-11-15 – 2018-11-25 (×4): 1 mg via INTRAVENOUS
  Filled 2018-11-15 (×4): qty 1

## 2018-11-15 MED ORDER — TAB-A-VITE/IRON PO TABS
1.0000 | ORAL_TABLET | Freq: Every day | ORAL | Status: DC
  Administered 2018-11-15 – 2018-12-09 (×24): 1 via ORAL
  Filled 2018-11-15 (×25): qty 1

## 2018-11-15 NOTE — Progress Notes (Addendum)
      [address]       [place] [address]             [phone]      3 Days Post-Op Procedure(s) (LRB): VIDEO ASSISTED THORACOSCOPY (VATS)/EMPYEMA, MINI THORACOTOMY (Left) DECORTICATION (Left) Subjective: No issues overnight. He feels okay but appetite is poor.   Objective: Vital signs in last 24 hours: Temp:  [97.9 F (36.6 C)-98.7 F (37.1 C)] 98.1 F (36.7 C) (04/10 0308) Pulse Rate:  [65-83] 78 (04/10 0308) Cardiac Rhythm: Normal sinus rhythm (04/09 1905) Resp:  [15-21] 19 (04/10 0510) BP: (95-139)/(65-83) 102/66 (04/10 0308) SpO2:  [92 %-100 %] 100 % (04/10 0510)     Intake/Output from previous day: 04/09 0701 - 04/10 0700 In: 246.4 [P.O.:240; IV Piggyback:6.4] Out: 1265 [Urine:1225; Chest Tube:40] Intake/Output this shift: No intake/output data recorded.  General appearance: alert, cooperative and no distress Heart: regular rate and rhythm, S1, S2 normal, no murmur, click, rub or gallop Lungs: clear to auscultation bilaterally Abdomen: soft, non-tender; bowel sounds normal; no masses,  no organomegaly Extremities: extremities normal, atraumatic, no cyanosis or edema Wound: clean and dry  Lab Results: Recent Labs    11/14/18 0348 11/15/18 0410  WBC 19.8* 16.3*  HGB 7.8* 7.4*  HCT 22.7* 22.2*  PLT 521* 487*   BMET:  Recent Labs    11/13/18 0441 11/14/18 0348  NA 131* 132*  K 4.1 4.1  CL 105 99  CO2 21* 24  GLUCOSE 119* 96  BUN 17 10  CREATININE 0.89 0.87  CALCIUM 8.3* 8.3*    PT/INR: No results for input(s): LABPROT, INR in the last 72 hours. ABG    Component Value Date/Time   PHART 7.416 11/13/2018 0420   HCO3 23.2 11/13/2018 0420   TCO2 20 (L) 11/03/2018 1807   ACIDBASEDEF 0.7 11/13/2018 0420   O2SAT 96.5 11/13/2018 0420   CBG (last 3)  No results for input(s): GLUCAP in the last 72 hours.  Assessment/Plan: S/P Procedure(s) (LRB): VIDEO ASSISTED THORACOSCOPY (VATS)/EMPYEMA, MINI THORACOTOMY (Left)  DECORTICATION (Left)  1. CV-BP well controlled. NSR in the 70s. 2. Pulm-CT to water seal yesterday. Likely remove anterior chest tube today. CXR is stable with no significant change. Continue to wean oxygen-currently on 2L [place].  3. Renal-creatinine 0.87, electrolytes okay. 4. H and H has been steadily declining. Will add a multivitamin with Iron 5. Continue Synthroid 6. Gram negative rods from OR cultures. Final report pending. Continue broad spectrum antibiotics with Vanc and Zosyn  Plan: Remove anterior chest tube today. Only 40cc out of chest tubes. Discussed with nursing. Not eating well and no bowel movement yet. He does not want a laxative.    LOS: 12 days    Elgie Collard 11/15/2018  Patient improving-not using PCA which will be discontinued Chest x-ray with improved aeration 50 cc serosanguineous drainage yesterday No air leak from tubes-we will remove anterior chest tube today Probably remove posterior chest tube 24-48 hours Check chest x-ray in a.m.  patient examined and medical record reviewed,agree with above note. [name] 11/15/2018
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
## 2018-11-16 ENCOUNTER — Inpatient Hospital Stay (HOSPITAL_COMMUNITY): Payer: Medicaid Other

## 2018-11-16 LAB — CBC
HCT: 23.1 % — ABNORMAL LOW (ref 39.0–52.0)
Hemoglobin: 7.9 g/dL — ABNORMAL LOW (ref 13.0–17.0)
MCH: 33.1 pg (ref 26.0–34.0)
MCHC: 34.2 g/dL (ref 30.0–36.0)
MCV: 96.7 fL (ref 80.0–100.0)
Platelets: 721 10*3/uL — ABNORMAL HIGH (ref 150–400)
RBC: 2.39 MIL/uL — ABNORMAL LOW (ref 4.22–5.81)
RDW: 13.5 % (ref 11.5–15.5)
WBC: 16.5 10*3/uL — ABNORMAL HIGH (ref 4.0–10.5)
nRBC: 0 % (ref 0.0–0.2)

## 2018-11-16 LAB — BASIC METABOLIC PANEL
Anion gap: 13 (ref 5–15)
BUN: 9 mg/dL (ref 6–20)
CO2: 25 mmol/L (ref 22–32)
Calcium: 8.4 mg/dL — ABNORMAL LOW (ref 8.9–10.3)
Chloride: 97 mmol/L — ABNORMAL LOW (ref 98–111)
Creatinine, Ser: 0.97 mg/dL (ref 0.61–1.24)
GFR calc Af Amer: >60 mL/min (ref >60)
GFR calc non Af Amer: >60 mL/min (ref >60)
Glucose, Bld: 85 mg/dL (ref 70–99)
Potassium: 3.8 mmol/L (ref 3.5–5.1)
Sodium: 135 mmol/L (ref 135–145)
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
## 2018-11-16 MED ORDER — ENOXAPARIN SODIUM 30 MG/0.3ML ~~LOC~~ SOLN
30.0000 mg | SUBCUTANEOUS | Status: DC
  Administered 2018-11-16 – 2018-12-09 (×23): 30 mg via SUBCUTANEOUS
  Filled 2018-11-16 (×25): qty 0.3

## 2018-11-16 MED ORDER — POLYETHYLENE GLYCOL 3350 17 G PO PACK
17.0000 g | PACK | Freq: Every day | ORAL | Status: DC
  Administered 2018-11-16 – 2018-11-17 (×2): 17 g via ORAL
  Filled 2018-11-16 (×9): qty 1
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
## 2018-11-17 ENCOUNTER — Inpatient Hospital Stay (HOSPITAL_COMMUNITY): Payer: Medicaid Other

## 2018-11-17 LAB — CBC
HCT: 23.1 % — ABNORMAL LOW (ref 39.0–52.0)
Hemoglobin: 7.8 g/dL — ABNORMAL LOW (ref 13.0–17.0)
MCH: 32.5 pg (ref 26.0–34.0)
MCHC: 33.8 g/dL (ref 30.0–36.0)
MCV: 96.3 fL (ref 80.0–100.0)
Platelets: 740 10*3/uL — ABNORMAL HIGH (ref 150–400)
RBC: 2.4 MIL/uL — ABNORMAL LOW (ref 4.22–5.81)
RDW: 13.4 % (ref 11.5–15.5)
WBC: 14 10*3/uL — ABNORMAL HIGH (ref 4.0–10.5)
nRBC: 0 % (ref 0.0–0.2)

## 2018-11-17 LAB — AEROBIC/ANAEROBIC CULTURE W GRAM STAIN (SURGICAL/DEEP WOUND)
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
## 2018-11-17 MED ORDER — FERROUS SULFATE 325 (65 FE) MG PO TABS
325.0000 mg | ORAL_TABLET | Freq: Two times a day (BID) | ORAL | Status: DC
  Administered 2018-11-17 – 2018-12-09 (×46): 325 mg via ORAL
  Filled 2018-11-17 (×44): qty 1

## 2018-11-17 MED ORDER — FOLIC ACID 1 MG PO TABS
1.0000 mg | ORAL_TABLET | Freq: Every day | ORAL | Status: DC
  Administered 2018-11-17 – 2018-12-09 (×22): 1 mg via ORAL
  Filled 2018-11-17 (×23): qty 1

## 2018-11-17 NOTE — Progress Notes (Signed)
Patient ID: [name], male   DOB: July 07, 1961, 58 y.o.   MRN: [id number] T[facility] DAILY ICU PROGRESS NOTE                   [name]            [place] [address]          [phone]   5 Days Post-Op Procedure(s) (LRB): VIDEO ASSISTED THORACOSCOPY (VATS)/EMPYEMA, MINI THORACOTOMY (Left) DECORTICATION (Left)  Total Length of Stay:  LOS: 14 days   Subjective: No complaints   Objective: Vital signs in last 24 hours: Temp:  [98.2 F (36.8 C)-99.2 F (37.3 C)] 98.3 F (36.8 C) (04/12 0734) Pulse Rate:  [68-78] 68 (04/12 0737) Resp:  [15-18] 18 (04/12 0737) BP: (96-129)/(68-82) 129/80 (04/12 0737) SpO2:  [94 %-96 %] 96 % (04/12 0737) Weight:  [64.1 kg] 64.1 kg (04/12 0644)  Filed Weights   11/11/18 1400 11/17/18 0644  Weight: 74.4 kg 64.1 kg    Weight change:    Hemodynamic parameters for last 24 hours:    Intake/Output from previous day: 04/11 0701 - 04/12 0700 In: 1737.6 [P.O.:240; I.V.:500; IV Piggyback:997.6] Out: 800 [Urine:800]  Intake/Output this shift: No intake/output data recorded.  Current Meds: Scheduled Meds: . sodium chloride   Intravenous Once  . acetaminophen  1,000 mg Oral Q6H   Or  . acetaminophen (TYLENOL) oral liquid 160 mg/5 mL  1,000 mg Oral Q6H  . amLODipine  5 mg Oral Daily  . bisacodyl  10 mg Oral Daily  . docusate sodium  100 mg Oral Daily  . enoxaparin (LOVENOX) injection  30 mg Subcutaneous Q24H  . levothyroxine  175 mcg Oral Q0600  . methocarbamol  500 mg Oral Q6H  . multivitamins with iron  1 tablet Oral Daily  . mupirocin ointment  1 application Nasal BID  . polyethylene glycol  17 g Oral Daily  . senna-docusate  1 tablet Oral QHS  . sertraline  100 mg Oral Daily  . thiamine  100 mg Oral Daily   Continuous Infusions: . piperacillin-tazobactam (ZOSYN)  IV 3.375 g (11/17/18 0159)  . potassium chloride    . vancomycin 1,000 mg (11/16/18 2221)   PRN Meds:.HYDROmorphone (DILAUDID) injection, ondansetron  **OR** [DISCONTINUED] ondansetron (ZOFRAN) IV, oxyCODONE, phenol, potassium chloride  General appearance: alert, cooperative and no distress Neurologic: intact Heart: regular rate and rhythm, S1, S2 normal, no murmur, click, rub or gallop Lungs: diminished breath sounds bibasilar Extremities: extremities normal, atraumatic, no cyanosis or edema and Homans sign is negative, no sign of DVT Wound: ct sites bandaged  Lab Results: CBC: Recent Labs    11/16/18 0430 11/17/18 0048  WBC 16.5* 14.0*  HGB 7.9* 7.8*  HCT 23.1* 23.1*  PLT 721* 740*   BMET:  Recent Labs    11/16/18 0430  NA 135  K 3.8  CL 97*  CO2 25  GLUCOSE 85  BUN 9  CREATININE 0.97  CALCIUM 8.4*    CMET: Lab Results  Component Value Date   WBC 14.0 (H) 11/17/2018   HGB 7.8 (L) 11/17/2018   HCT 23.1 (L) 11/17/2018   PLT 740 (H) 11/17/2018   GLUCOSE 85 11/16/2018   ALT 36 11/14/2018   AST 32 11/14/2018   NA 135 11/16/2018   K 3.8 11/16/2018   CL 97 (L) 11/16/2018   CREATININE 0.97 11/16/2018   BUN 9 11/16/2018   CO2 25 11/16/2018   TSH 26.177 (H) 11/11/2018   INR  1.3 (H) 11/11/2018      PT/INR: No results for input(s): LABPROT, INR in the last 72 hours. Radiology: No results found.    Assessment/Plan: S/P Procedure(s) (LRB): VIDEO ASSISTED THORACOSCOPY (VATS)/EMPYEMA, MINI THORACOTOMY (Left) DECORTICATION (Left) Stable post op, small retrosternal hydropneumothorax  Will need follow up chest xray as outpatient to ensure does not develop traumatic pneumocele ? To [facility] Monday   Expected Acute  Blood - loss Anemia- continue to monitor , start  Iron and folate po  Before d/c or transfer will need to decide on completion of antibiotic therapy Hypothyroid on synthroid    [name] 11/17/2018 8:27 AM
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
## 2018-11-17 NOTE — Progress Notes (Signed)
Pharmacy Antibiotic Note  [name] is a 58 y.o. male POD#5 VATS for left empyema.  Pharmacy has been consulted for Vancomycin and Zosyn dosing on 4/8 due to + gram stain on pleural fluid culture, which has now grown few Sphingomonas paucimobilis, for which susceptibility testing is not available.  Day #5 Vanc and Zosyn, and day # 7 antibiotics.  Chest tubes out 4/10 and 4/11.    Discussed with Dr. Servando Snare re: possibly stopping Vancomycin. To continue for today. Dr. Prescott Gum to make antibiotic decision.  Sphingomonas usually susceptible to carbapenems, aminogylcosides, Septra and Zosyn.  Possible transfer to [facility] in the next few days.   Plan:  Continue Zosyn 3.375 gm IV q8hrs (each over 4 hours)  Continue Vancomycin 1gm IV q12h  Will defer Vanc trough level, as course may end soon.  Could consider Septra if to change to oral antibiotic.  Follow renal function, antibiotic plans.   Goal AUC 400-550  Expected AUC: 484  SCr used: 0.89   Height: 5\' 8"  (172.7 cm) Weight: 141 lb 5 oz (64.1 kg) IBW/kg (Calculated) : 68.4  Temp (24hrs), Avg:98.6 F (37 C), Min:98 F (36.7 C), Max:99.2 F (37.3 C)  Recent Labs  Lab 11/11/18 1355  11/13/18 0441 11/14/18 0348 11/15/18 0410 11/16/18 0430 11/17/18 0048  WBC  --    < > 25.0* 19.8* 16.3* 16.5* 14.0*  CREATININE 0.93  --  0.89 0.87  --  0.97  --    < > = values in this interval not displayed.    Estimated Creatinine Clearance: 75.3 mL/min (by C-G formula based on SCr of 0.97 mg/dL).    No Known Allergies  Antimicrobials this admission:  Zosyn 4/6>4/7; resume 4/8>>  Vanc 4/6>>4/7; resume 4/8>>  Cefuroxime 4/7>>4/8  Microbiology results:   4/7 pleural fluid (left), fungus smear - no fungus x 3 days to date   4/7 pleural fluid (left) - rare GNR on Gram stain, reincubated > few Sphingomonas paucimobilis - sensitivities will not be reported; no anaerobes to date   4/6 surgical MRSA PCR - positive for Staph,  negative for MRSA   4/2 MRSA PCR negative  Thank you for allowing pharmacy to be a part of this patient's care.  Arty Baumgartner, [place] Pager: [phone] or phone: [phone] 11/17/2018 2:39 PM

## 2018-11-18 MED ORDER — CIPROFLOXACIN HCL 500 MG PO TABS
500.0000 mg | ORAL_TABLET | Freq: Two times a day (BID) | ORAL | Status: AC
  Administered 2018-11-18 – 2018-11-24 (×14): 500 mg via ORAL
  Filled 2018-11-18 (×14): qty 1

## 2018-11-18 MED ORDER — ACETAMINOPHEN 325 MG PO TABS
650.0000 mg | ORAL_TABLET | Freq: Four times a day (QID) | ORAL | Status: DC | PRN
  Administered 2018-11-18 – 2018-12-07 (×13): 650 mg via ORAL
  Filled 2018-11-18 (×13): qty 2

## 2018-11-18 MED ORDER — DIPHENHYDRAMINE HCL 50 MG/ML IJ SOLN
25.0000 mg | Freq: Four times a day (QID) | INTRAMUSCULAR | Status: DC | PRN
  Administered 2018-11-18 – 2018-11-30 (×7): 25 mg via INTRAVENOUS
  Filled 2018-11-18 (×6): qty 1

## 2018-11-18 MED ORDER — METHYLPREDNISOLONE SODIUM SUCC 125 MG IJ SOLR
60.0000 mg | Freq: Four times a day (QID) | INTRAMUSCULAR | Status: DC
  Administered 2018-11-18 – 2018-11-19 (×2): 60 mg via INTRAVENOUS
  Filled 2018-11-18: qty 2

## 2018-11-18 NOTE — Progress Notes (Signed)
Pt was running a low grade temperature of 100.5. MD,Dr White C notified. Tylenol 650 mg PRN was ordered. Tylenol 650 mg administered. Temperature rechecked after 1hr, tempt =99.4. Will continue monitoring.
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
## 2018-11-19 ENCOUNTER — Inpatient Hospital Stay (HOSPITAL_COMMUNITY): Payer: Medicaid Other

## 2018-11-19 LAB — CBC
HCT: 25.2 % — ABNORMAL LOW (ref 39.0–52.0)
Hemoglobin: 8.4 g/dL — ABNORMAL LOW (ref 13.0–17.0)
MCH: 32.2 pg (ref 26.0–34.0)
MCHC: 33.3 g/dL (ref 30.0–36.0)
MCV: 96.6 fL (ref 80.0–100.0)
Platelets: 816 10*3/uL — ABNORMAL HIGH (ref 150–400)
RBC: 2.61 MIL/uL — ABNORMAL LOW (ref 4.22–5.81)
RDW: 13.5 % (ref 11.5–15.5)
WBC: 21.3 10*3/uL — ABNORMAL HIGH (ref 4.0–10.5)
nRBC: 0 % (ref 0.0–0.2)
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
## 2018-11-19 MED ORDER — GUAIFENESIN 100 MG/5ML PO SOLN
15.0000 mL | ORAL | Status: DC | PRN
  Administered 2018-11-19 – 2018-12-03 (×14): 300 mg via ORAL
  Filled 2018-11-19 (×5): qty 20
  Filled 2018-11-19: qty 10
  Filled 2018-11-19 (×5): qty 20
  Filled 2018-11-19: qty 10
  Filled 2018-11-19 (×3): qty 20

## 2018-11-19 MED ORDER — HYDROCOD POLST-CPM POLST ER 10-8 MG/5ML PO SUER
5.0000 mL | Freq: Three times a day (TID) | ORAL | Status: DC
  Administered 2018-11-19 – 2018-12-09 (×61): 5 mL via ORAL
  Filled 2018-11-19 (×62): qty 5

## 2018-11-19 NOTE — Progress Notes (Signed)
Called by nurse for neck and chest swelling  No stridor and vitals normal A change from earlier in the day  Exam shows SQ air without redness itching or a rash involving neck and chest soft  Vitals stable  On 02 Check CXR RECENT VATS BY CVTS  May need their input but stable at this point and does not require intubation

## 2018-11-19 NOTE — Progress Notes (Signed)
RN called to pts room due to swelling in neck and face. Pt VS stable. O2 applied to pt and MD was called. MD gave order for benadryl, solumedrol, and chest X-ray. Orders followed per [facility] and results. RN asked MD to please come and lay eyes on the pt. MD came to the floor saw the pt and stated he would let us know what to do after x-ray resulted. MD notified RN to call cardio thoracic if it looked like a pneumothorax on X-ray. Will cont to mont. Waiting on results.

## 2018-11-19 NOTE — Progress Notes (Signed)
Patient assisted up in chair for each meal and ambulated the hallway after each meal . After dinner this evening, patient went back to bed and within 30 mins, called nurse that he felt "Hot" and was sweating profusely. Upon assessment, patient's eyes, neck and cheeks noted to be more swollen to where patient's eyes are shut. Assisted up to chair as nurse noticed that helps with the swelling. Patient is currently up in the chair on Non-rebreather mask at 10 L. RT consulted and checked patient. Will continue to monitor.
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
## 2018-11-19 NOTE — Progress Notes (Signed)
RN called MD back to notify him that chest X-ray has resulted. MD notified RN that it was just subcutaneous air and no new orders received. Will cont to monitor.

## 2018-11-19 NOTE — Progress Notes (Addendum)
      [address]       [place] [address]             [phone]      7 Days Post-Op Procedure(s) (LRB): VIDEO ASSISTED THORACOSCOPY (VATS)/EMPYEMA, MINI THORACOTOMY (Left) DECORTICATION (Left) Subjective: Significant subcutaneous emphysema this morning but no airway compromise.  Objective: Vital signs in last 24 hours: Temp:  [97.5 F (36.4 C)-100.1 F (37.8 C)] 97.9 F (36.6 C) (04/14 0636) Pulse Rate:  [87-96] 95 (04/14 0400) Cardiac Rhythm: Normal sinus rhythm (04/14 0118) Resp:  [23-27] 23 (04/14 0400) BP: (118-145)/(70-84) 120/84 (04/14 0636) SpO2:  [94 %-100 %] 97 % (04/14 0400)     Intake/Output from previous day: 04/13 0701 - 04/14 0700 In: 360 [P.O.:360] Out: 150 [Urine:150] Intake/Output this shift: No intake/output data recorded.  General appearance: alert, cooperative and no distress Heart: regular rate and rhythm, S1, S2 normal, no murmur, click, rub or gallop Lungs: clear to auscultation bilaterally, significant subq emphysema with extension into the face, neck, chest, and upper extremity, Abdomen: soft, non-tender; bowel sounds normal; no masses,  no organomegaly Extremities: extremities normal, atraumatic, no cyanosis or edema Wound: clean and dry  Lab Results: Recent Labs    11/17/18 0048 11/19/18 0452  WBC 14.0* 21.3*  HGB 7.8* 8.4*  HCT 23.1* 25.2*  PLT 740* 816*   BMET: No results for input(s): NA, K, CL, CO2, GLUCOSE, BUN, CREATININE, CALCIUM in the last 72 hours.  PT/INR: No results for input(s): LABPROT, INR in the last 72 hours. ABG    Component Value Date/Time   PHART 7.416 11/13/2018 0420   HCO3 23.2 11/13/2018 0420   TCO2 20 (L) 11/03/2018 1807   ACIDBASEDEF 0.7 11/13/2018 0420   O2SAT 96.5 11/13/2018 0420   CBG (last 3)  No results for input(s): GLUCAP in the last 72 hours.  Assessment/Plan: S/P Procedure(s) (LRB): VIDEO ASSISTED THORACOSCOPY (VATS)/EMPYEMA, MINI THORACOTOMY (Left) DECORTICATION  (Left)  1. Subcutaneous emphysema consistent with decompression of the known left hydropneumothorax into the chest wall. Does not appear to be present on the CXR from 4/12. Patient on room air with saturation of 92%. Can still swallow okay and not particularly short of breath but does have significant subcutaneous emphysema that extends into the face, neck, chest, back, and upper extremities. 2. Renal-creatinine 0.97, electrolytes okay 3. H and H improved 8.4/25.2, expected acute blood loss anemia 4. On Cipro for sphingomonas paucimobilis -we would recommend an additional 7 days.   Plan: Serial chest xrays for now. Will hold discharge until improvement in subq emphysema. Appointment made and in the chart.   LOS: 16 days    [name] 11/19/2018   Patient examined and chest x-ray image reviewed Subcutaneous air developed after fairly violent coughing episode yesterday with probable reopening of a portion of the lung laceration and dissection of air into the chest wall.  No significant pneumothorax.  Chest tube would not help the situation.  Postoperative subcutaneous emphysema will improve over time with supportive care and humidify oxygen therapy.  Patient will need to remain hospitalized at [facility] until this improves and stabilizes.  patient examined and medical record reviewed,agree with above note. [name] 11/19/2018
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
## 2018-11-19 NOTE — Progress Notes (Signed)
PT Cancellation Note  Patient Details Name: [name] MRN: [id number] DOB: 12/28/1960   Cancelled Treatment:    Reason Eval/Treat Not Completed: Other (comment) Pt now with increased swelling in face and neck region with eyes swollen shut. Just got back from walking unit with sitter and plans to do so again around lunch time. Will follow.   [name] 11/19/2018, 12:38 PM Wray Kearns, PT, DPT Acute Rehabilitation Services Pager [phone] Office [phone]

## 2018-11-20 ENCOUNTER — Inpatient Hospital Stay (HOSPITAL_COMMUNITY): Payer: Medicaid Other
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
## 2018-11-20 MED ORDER — KETOROLAC TROMETHAMINE 30 MG/ML IJ SOLN: 30.0000 mg | Freq: Every day | INTRAMUSCULAR | Status: DC
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
## 2018-11-21 ENCOUNTER — Inpatient Hospital Stay (HOSPITAL_COMMUNITY): Payer: Medicaid Other

## 2018-11-21 LAB — CBC WITH DIFFERENTIAL/PLATELET
Abs Immature Granulocytes: 0.11 10*3/uL — ABNORMAL HIGH (ref 0.00–0.07)
Basophils Absolute: 0 10*3/uL (ref 0.0–0.1)
Basophils Relative: 0 %
Eosinophils Absolute: 0.3 10*3/uL (ref 0.0–0.5)
Eosinophils Relative: 2 %
HCT: 23.5 % — ABNORMAL LOW (ref 39.0–52.0)
Hemoglobin: 7.8 g/dL — ABNORMAL LOW (ref 13.0–17.0)
Immature Granulocytes: 1 %
Lymphocytes Relative: 17 %
Lymphs Abs: 2.6 10*3/uL (ref 0.7–4.0)
MCH: 32.5 pg (ref 26.0–34.0)
MCHC: 33.2 g/dL (ref 30.0–36.0)
MCV: 97.9 fL (ref 80.0–100.0)
Monocytes Absolute: 0.7 10*3/uL (ref 0.1–1.0)
Monocytes Relative: 5 %
Neutro Abs: 11.3 10*3/uL — ABNORMAL HIGH (ref 1.7–7.7)
Neutrophils Relative %: 75 %
Platelets: 864 10*3/uL — ABNORMAL HIGH (ref 150–400)
RBC: 2.4 MIL/uL — ABNORMAL LOW (ref 4.22–5.81)
RDW: 13.7 % (ref 11.5–15.5)
WBC: 14.9 10*3/uL — ABNORMAL HIGH (ref 4.0–10.5)
nRBC: 0.1 % (ref 0.0–0.2)

## 2018-11-21 IMAGING — DX PORTABLE CHEST - 1 VIEW
1 series · 1 of 1 positions shown · non-contrast
Comparison: [DATE]

CLINICAL DATA: Short of breath

EXAM:
PORTABLE CHEST 1 VIEW

[chest]
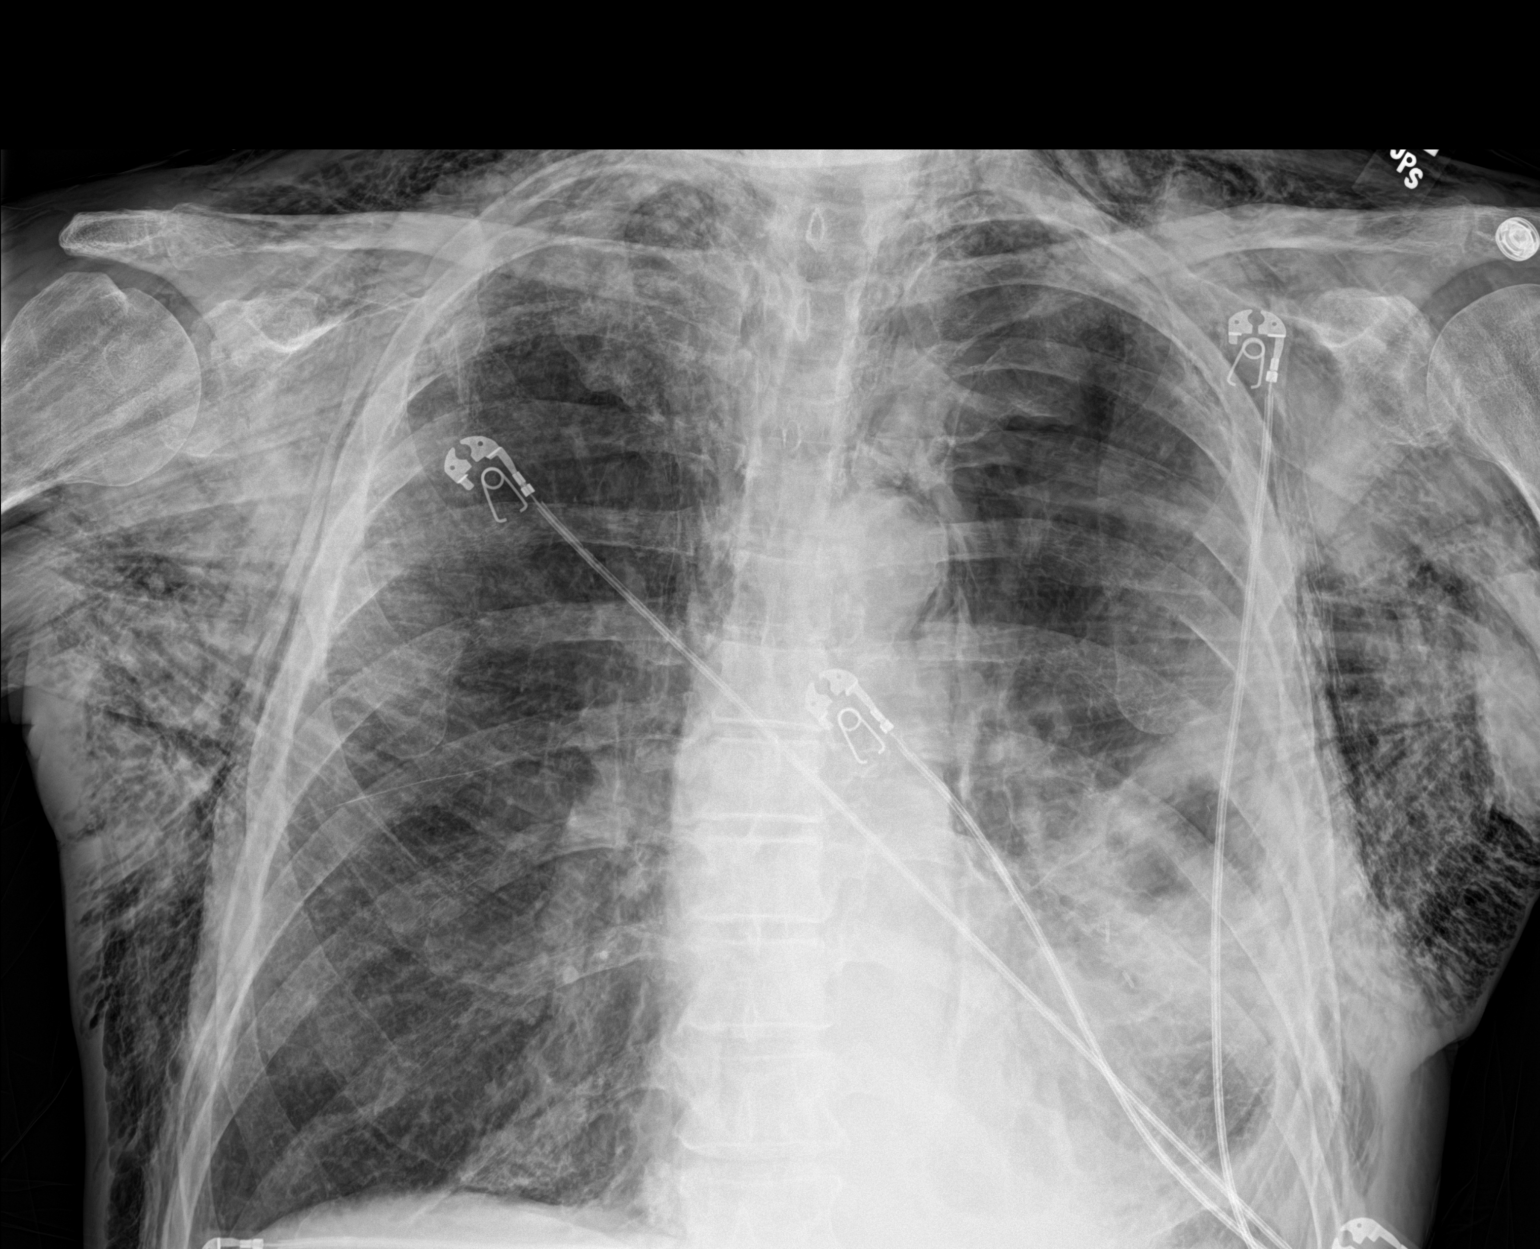

[1 of 1 positions shown; findings below may reference images not displayed]

FINDINGS: Normal heart size. Pleural and parenchymal changes at the left lower
lung zone are stable. There is extensive soft tissue emphysema over
the chest wall and supraclavicular regions. There is prominent
pneumomediastinum. There is no definitive pneumothorax. A small left
pneumothorax is noted at the left apex. It is not significantly
changed compared with [DATE].
IMPRESSION: Stable left hydropneumothorax compared with [DATE].

Stable parenchymal changes at the left lung base.

Subcutaneous and mediastinal emphysema are stable.

## 2018-11-21 MED ORDER — SORBITOL 70 % SOLN
60.0000 mL | Freq: Once | Status: DC
  Filled 2018-11-21 (×2): qty 60

## 2018-11-21 MED ORDER — SORBITOL 70 % PO SOLN
60.0000 mL | Freq: Once | ORAL | Status: DC
  Filled 2018-11-21: qty 60

## 2018-11-21 NOTE — Progress Notes (Addendum)
      [address]       [place] [address]             [phone]      9 Days Post-Op Procedure(s) (LRB): VIDEO ASSISTED THORACOSCOPY (VATS)/EMPYEMA, MINI THORACOTOMY (Left) DECORTICATION (Left) Subjective: Stilling having significant subq emphysema in face, neck, and upper extremity  Objective: Vital signs in last 24 hours: Temp:  [97.3 F (36.3 C)-97.9 F (36.6 C)] 97.9 F (36.6 C) (04/16 0045) Pulse Rate:  [67-75] 75 (04/15 1627) Cardiac Rhythm: Normal sinus rhythm (04/16 0700) Resp:  [12-18] 17 (04/15 1627) BP: (114)/(66-71) 114/71 (04/15 1627) SpO2:  [95 %-97 %] 97 % (04/15 1627)     Intake/Output from previous day: 04/15 0701 - 04/16 0700 In: -  Out: 575 [Urine:575] Intake/Output this shift: No intake/output data recorded.  General appearance: alert, cooperative and no distress Heart: regular rate and rhythm, S1, S2 normal, no murmur, click, rub or gallop Lungs: clear to auscultation bilaterally Abdomen: soft, non-tender; bowel sounds normal; no masses,  no organomegaly Extremities: extremities normal, atraumatic, no cyanosis or edema Wound: clean and dry  Lab Results: Recent Labs    11/19/18 0452 11/21/18 0456  WBC 21.3* 14.9*  HGB 8.4* 7.8*  HCT 25.2* 23.5*  PLT 816* 864*   BMET: No results for input(s): NA, K, CL, CO2, GLUCOSE, BUN, CREATININE, CALCIUM in the last 72 hours.  PT/INR: No results for input(s): LABPROT, INR in the last 72 hours. ABG    Component Value Date/Time   PHART 7.416 11/13/2018 0420   HCO3 23.2 11/13/2018 0420   TCO2 20 (L) 11/03/2018 1807   ACIDBASEDEF 0.7 11/13/2018 0420   O2SAT 96.5 11/13/2018 0420   CBG (last 3)  No results for input(s): GLUCAP in the last 72 hours.  Assessment/Plan: S/P Procedure(s) (LRB): VIDEO ASSISTED THORACOSCOPY (VATS)/EMPYEMA, MINI THORACOTOMY (Left) DECORTICATION (Left)  1. Subcutaneous emphysema on CXR is stable. There is a small left apical  Pneumothorax. Daily chest  xrays and monitoring 2. Tolerating room air with excellent saturation.  3. NSR in the 60s-70s 4. No fevers noted. WBC trending down and is 14.9 today. Continue Cipro 500mg  BID. Antibiotics to end on 4/21. 5. H and H 7.8/23.5, continue to trend. Platelets 864k-continues to increase 6. Psych eval pending  Plan: . Encouraged hot/warm compresses to provide relief to his eyes and face. Appetite seems to be improving. Feels okay overall except when he has a coughing attack which is unpredictable then becomes very short of breath. On multiple cough suppressants.    LOS: 18 days    Elgie Collard 11/21/2018  WBC improved, O2 sat good on room air Continue supportive care, observation in progressive care and daily CXR patient examined and medical record reviewed,agree with above note. [name] 11/21/2018
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
## 2018-11-21 NOTE — Progress Notes (Signed)
Miralax, Colace, Dulcolax and Sorbitol refused by patient.  Patient states he is having regular bowel movements.  Patient reports last BM was yesterday afternoon.
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
## 2018-11-22 ENCOUNTER — Inpatient Hospital Stay (HOSPITAL_COMMUNITY): Payer: Medicaid Other

## 2018-11-22 LAB — CBC
HCT: 26.4 % — ABNORMAL LOW (ref 39.0–52.0)
Hemoglobin: 8.3 g/dL — ABNORMAL LOW (ref 13.0–17.0)
MCH: 30.9 pg (ref 26.0–34.0)
MCHC: 31.4 g/dL (ref 30.0–36.0)
MCV: 98.1 fL (ref 80.0–100.0)
Platelets: 893 10*3/uL — ABNORMAL HIGH (ref 150–400)
RBC: 2.69 MIL/uL — ABNORMAL LOW (ref 4.22–5.81)
RDW: 13.5 % (ref 11.5–15.5)
WBC: 14.1 10*3/uL — ABNORMAL HIGH (ref 4.0–10.5)
nRBC: 0 % (ref 0.0–0.2)

## 2018-11-22 IMAGING — DX PORTABLE CHEST - 1 VIEW
1 series · 1 of 1 positions shown · non-contrast
Comparison: One-view chest x-ray [DATE]

CLINICAL DATA: Shortness of breath.

EXAM:
PORTABLE CHEST 1 VIEW

[chest ap]
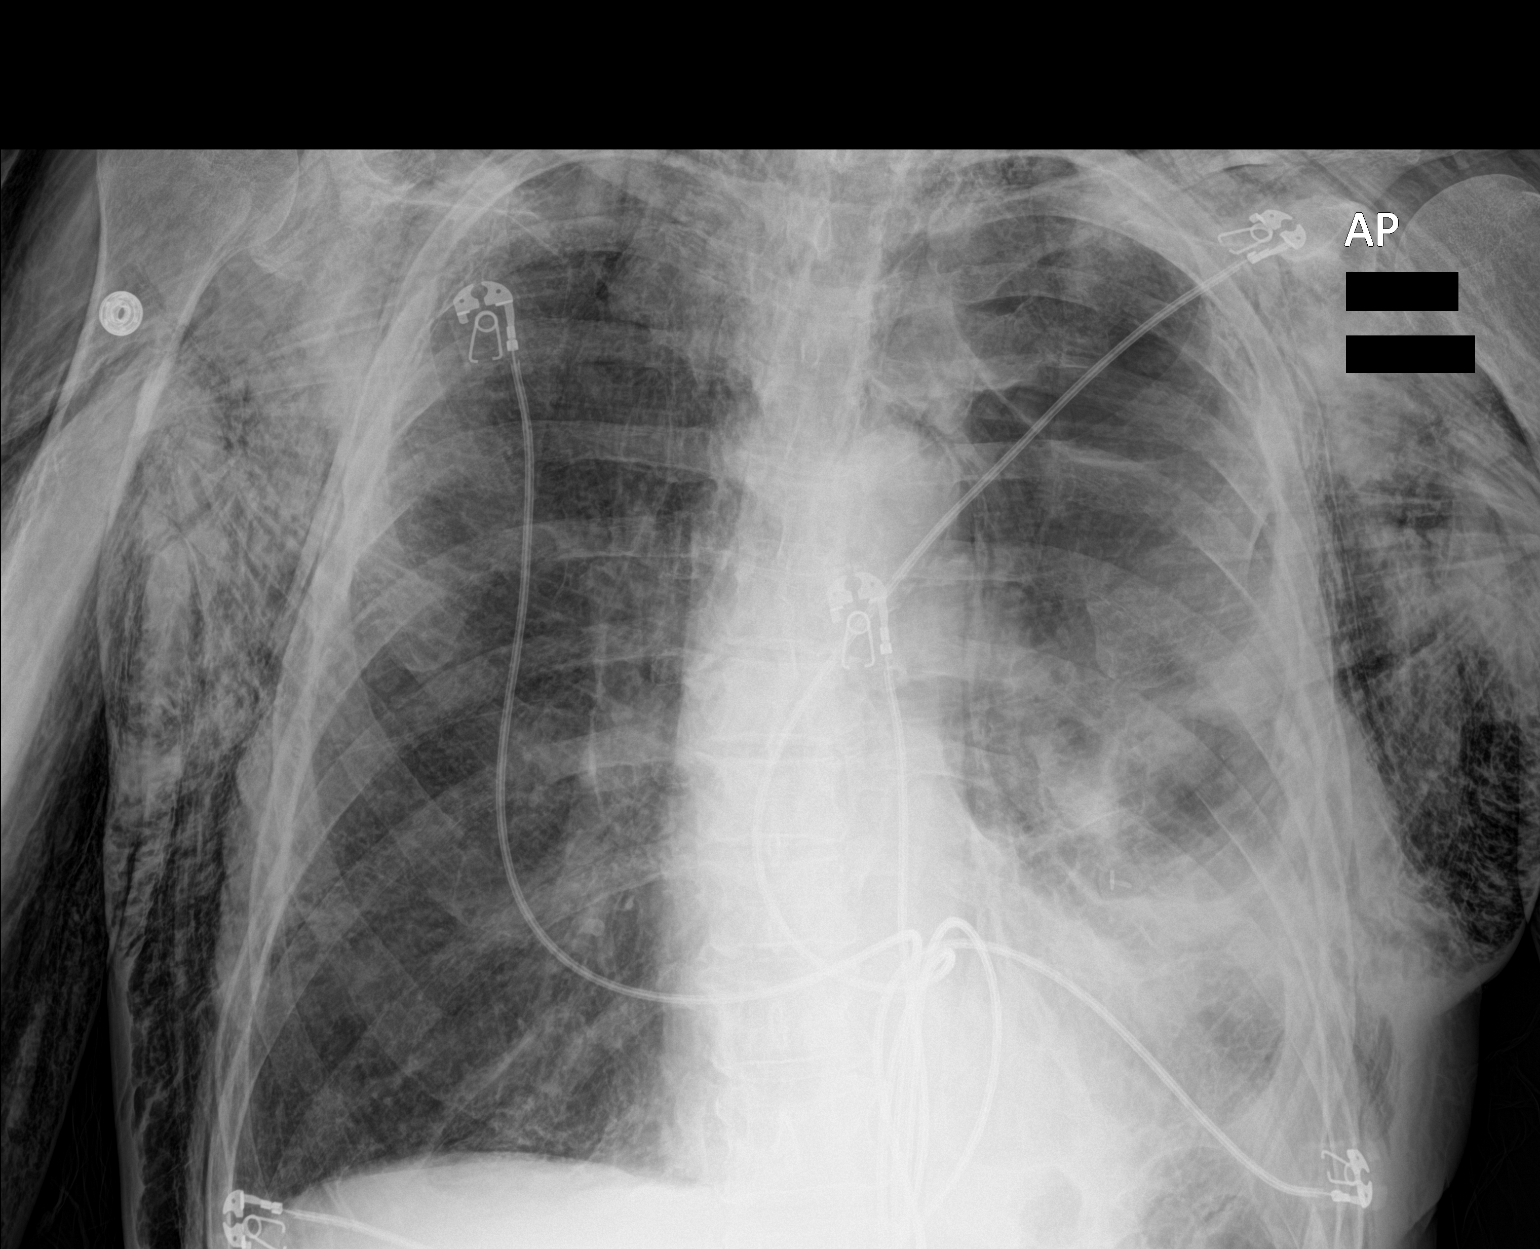

[1 of 1 positions shown; findings below may reference images not displayed]

FINDINGS: Heart size is normal. Left-sided pneumothorax is again seen.
Cavitary lesion in the left lower lobe is again noted. Extensive
bilateral subcutaneous emphysema remains. A smaller right-sided
pneumothorax is present.
IMPRESSION: 1. Persistent bilateral pneumothoraces, left greater than right.
2. Cavitary lesion in the left lower lobe.
3. Persistent extensive subcutaneous emphysema.
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
## 2018-11-22 NOTE — Progress Notes (Addendum)
      [address]       [place] [address]             [phone]      10 Days Post-Op Procedure(s) (LRB): VIDEO ASSISTED THORACOSCOPY (VATS)/EMPYEMA, MINI THORACOTOMY (Left) DECORTICATION (Left)   Subjective:  Patient states he had an episode last night where his neck got swollen and he felt like he was having difficulty breathing.  This has resolved this morning, and he states he feels he might be a little less swollen.  Objective: Vital signs in last 24 hours: Temp:  [97.5 F (36.4 C)-98 F (36.7 C)] 97.5 F (36.4 C) (04/17 0400) Pulse Rate:  [72-92] 85 (04/17 0400) Cardiac Rhythm: Normal sinus rhythm (04/16 2000) Resp:  [15-19] 18 (04/17 0400) BP: (110-137)/(67-79) 118/67 (04/17 0400) SpO2:  [94 %-98 %] 98 % (04/17 0400) FiO2 (%):  [30 %-35 %] 35 % (04/17 0300)  Intake/Output from previous day: 04/16 0701 - 04/17 0700 In: 630 [P.O.:630] Out: 590 [Urine:590]  General appearance: alert, cooperative, no distress and extensive sub q air on his face, including eyelids, L >R Heart: regular rate and rhythm Lungs: clear to auscultation bilaterally Abdomen: soft, non-tender; bowel sounds normal; no masses,  no organomegaly Extremities: extensive sub q air along bilateral torso Wound: clean and dry  Lab Results: Recent Labs    11/21/18 0456  WBC 14.9*  HGB 7.8*  HCT 23.5*  PLT 864*   BMET: No results for input(s): NA, K, CL, CO2, GLUCOSE, BUN, CREATININE, CALCIUM in the last 72 hours.  PT/INR: No results for input(s): LABPROT, INR in the last 72 hours. ABG    Component Value Date/Time   PHART 7.416 11/13/2018 0420   HCO3 23.2 11/13/2018 0420   TCO2 20 (L) 11/03/2018 1807   ACIDBASEDEF 0.7 11/13/2018 0420   O2SAT 96.5 11/13/2018 0420   CBG (last 3)  No results for input(s): GLUCAP in the last 72 hours.  Assessment/Plan: S/P Procedure(s) (LRB): VIDEO ASSISTED THORACOSCOPY (VATS)/EMPYEMA, MINI THORACOTOMY (Left) DECORTICATION (Left)  1.  Pulm- extensive sub a air bilaterally, L >R... CXR with bilateral pneumothorax, may require chest tube on left... will defer to Dr. Prescott Gum 2. Dispo- care per trauma, per patient some difficulty with breathing overnight due to swelling in his neck, this has since resolved, sub q air remains extensive, pneumothorax bilaterally on CXR L>R, chest tube placement may be indicated will defer to PVT   LOS: 19 days    [name] 11/22/2018  Patient examined, recent CXR images personally reviewed Maintaining normal O2 sats on room air CXR shows some improvement in sub Q air Pneumothorax on L remains small, stable and loculated. CT guided pigtail by   IR would need to traverse lung and probably make situation worse. Situatuion discussed with Dr Grandville Silos for coordination of care.  Continue supportive care ,daily CXR Vats incision healing well  patient examined and medical record reviewed,agree with above note. [name] 11/22/2018

## 2018-11-22 NOTE — Progress Notes (Signed)
Patient is currently on room air (O2 saturation: 96%) Patient denies shortness of breath as well as edema in the airway. Face tent is hooked up & patient is aware to call if he feels like he needs it.
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
## 2018-11-23 ENCOUNTER — Inpatient Hospital Stay (HOSPITAL_COMMUNITY): Payer: Medicaid Other

## 2018-11-23 LAB — CBC
HCT: 26.3 % — ABNORMAL LOW (ref 39.0–52.0)
Hemoglobin: 8.4 g/dL — ABNORMAL LOW (ref 13.0–17.0)
MCH: 31 pg (ref 26.0–34.0)
MCHC: 31.9 g/dL (ref 30.0–36.0)
MCV: 97 fL (ref 80.0–100.0)
Platelets: 845 10*3/uL — ABNORMAL HIGH (ref 150–400)
RBC: 2.71 MIL/uL — ABNORMAL LOW (ref 4.22–5.81)
RDW: 13.3 % (ref 11.5–15.5)
WBC: 15.1 10*3/uL — ABNORMAL HIGH (ref 4.0–10.5)
nRBC: 0 % (ref 0.0–0.2)
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
## 2018-11-23 NOTE — Plan of Care (Signed)
  Problem: Clinical Measurements: Goal: Remain free from any harm during hospitalization Outcome: Progressing   Problem: Coping: Goal: Ability to disclose and discuss thoughts of suicide and self-harm will improve Outcome: Progressing
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
## 2018-11-24 ENCOUNTER — Inpatient Hospital Stay (HOSPITAL_COMMUNITY): Payer: Medicaid Other
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
## 2018-11-24 MED ORDER — LIP MEDEX EX OINT
TOPICAL_OINTMENT | CUTANEOUS | Status: DC | PRN
  Filled 2018-11-24 (×2): qty 7
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
## 2018-11-24 NOTE — Progress Notes (Addendum)
      [address]       [place] [address]             [phone]       12 Days Post-Op Procedure(s) (LRB): VIDEO ASSISTED THORACOSCOPY (VATS)/EMPYEMA, MINI THORACOTOMY (Left) DECORTICATION (Left)  Subjective: Patient states breathing today is the same as previously. He does wish his eyes were less swollen  Objective: Vital signs in last 24 hours: Temp:  [97.5 F (36.4 C)-97.9 F (36.6 C)] 97.8 F (36.6 C) (04/19 0500) Pulse Rate:  [84-97] 97 (04/19 0500) Cardiac Rhythm: Normal sinus rhythm (04/18 1908) Resp:  [15-19] 18 (04/19 0500) BP: (101-125)/(65-85) 115/85 (04/19 0500) SpO2:  [98 %-100 %] 98 % (04/19 0500)     Intake/Output from previous day: 04/18 0701 - 04/19 0700 In: 720 [P.O.:720] Out: 450 [Urine:450]   Physical Exam:  Cardiovascular: RRR Pulmonary: Clear to auscultation bilaterally. Significant subcutaneous emphysema bilateral chest, neck face, eyes Wounds: Clean and dry.  No erythema or signs of infection.   Lab Results: CBC: Recent Labs    11/22/18 0534 11/23/18 0509  WBC 14.1* 15.1*  HGB 8.3* 8.4*  HCT 26.4* 26.3*  PLT 893* 845*   BMET: No results for input(s): NA, K, CL, CO2, GLUCOSE, BUN, CREATININE, CALCIUM in the last 72 hours.  PT/INR: No results for input(s): LABPROT, INR in the last 72 hours. ABG:  INR: Will add last result for INR, ABG once components are confirmed Will add last 4 CBG results once components are confirmed  Assessment/Plan:  1. CV - SR in the 90's. On Amlodipine 5 mg daily 2.  Pulmonary - On room air this am. CXR this am appears stable (persistent extensive subcutaneous emphysema, left pneumothorax and, trace right). Encourage incentive spirometer 3. On Cipro 500 mg bid- (few sphingomonas paucimobilis) 4. Anemia-H and H 8.4 and 26.3. Continue ferrous sulfate and folic acid 5. Hypothyroidism-on Levothyroxine 175 mcg daily  Raymond M ZimmermanPA-C 11/24/2018,7:54 AM [phone]  I have  seen and examined the patient and agree with the assessment and plan as outlined.  CXR and physical exam unchanged.  Consider non-contrast CT chest to evaluate size of pneumothorax and potentially guide chest tube placement if patient doesn't show signs of improvement, although patient states that he hopes to avoid tube placement.  Raymond Alberts, MD 11/24/2018 9:01 AM

## 2018-11-25 ENCOUNTER — Inpatient Hospital Stay (HOSPITAL_COMMUNITY): Payer: Medicaid Other

## 2018-11-25 LAB — PROTIME-INR
INR: 1.3 — ABNORMAL HIGH (ref 0.8–1.2)
Prothrombin Time: 16 seconds — ABNORMAL HIGH (ref 11.4–15.2)
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
## 2018-11-25 MED ORDER — MIDAZOLAM HCL 2 MG/2ML IJ SOLN
INTRAMUSCULAR | Status: AC | PRN
  Administered 2018-11-25 (×2): 1 mg via INTRAVENOUS

## 2018-11-25 MED ORDER — MIDAZOLAM HCL 2 MG/2ML IJ SOLN
INTRAMUSCULAR | Status: AC
  Filled 2018-11-25: qty 2

## 2018-11-25 MED ORDER — FENTANYL CITRATE (PF) 100 MCG/2ML IJ SOLN
INTRAMUSCULAR | Status: AC | PRN
  Administered 2018-11-25 (×2): 50 ug via INTRAVENOUS

## 2018-11-25 MED ORDER — LIDOCAINE HCL 1 % IJ SOLN
INTRAMUSCULAR | Status: AC
  Filled 2018-11-25: qty 20

## 2018-11-25 MED ORDER — FENTANYL CITRATE (PF) 100 MCG/2ML IJ SOLN
INTRAMUSCULAR | Status: AC
  Filled 2018-11-25: qty 2

## 2018-11-25 NOTE — Progress Notes (Addendum)
CT Surgery  CT chest shows L anterior-apical pneumothorax - IR eval for tube placement in progress- procedure d/w patient.  [place] [phone]
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
## 2018-11-25 NOTE — Progress Notes (Signed)
Patient transfer to IR for procedure.

## 2018-11-25 NOTE — Progress Notes (Addendum)
      [address]       [place] [address]             [phone]      13 Days Post-Op Procedure(s) (LRB): VIDEO ASSISTED THORACOSCOPY (VATS)/EMPYEMA, MINI THORACOTOMY (Left) DECORTICATION (Left)   Subjective:  No new complaints. Remains swollen and his voice remains compromised.  Objective: Vital signs in last 24 hours: Temp:  [97.9 F (36.6 C)-98.2 F (36.8 C)] 98 F (36.7 C) (04/20 0356) Pulse Rate:  [85-103] 88 (04/20 0356) Cardiac Rhythm: Normal sinus rhythm (04/19 1905) Resp:  [13-17] 13 (04/20 0356) BP: (110-133)/(69-80) 110/69 (04/20 0356) SpO2:  [95 %-97 %] 95 % (04/20 0356)  Intake/Output from previous day: 04/19 0701 - 04/20 0700 In: 740 [P.O.:740] Out: 1030 [Urine:1030]  General appearance: alert, cooperative and patient with extensive sub q air along both sides of face and eyelids, R>L Heart: regular rate and rhythm Lungs: clear to auscultation bilaterally Abdomen: soft, non-tender; bowel sounds normal; no masses,  no organomegaly Extremities: Extensive sub q air bilaterally Wound: clean and dry  Lab Results: Recent Labs    11/23/18 0509  WBC 15.1*  HGB 8.4*  HCT 26.3*  PLT 845*   BMET: No results for input(s): NA, K, CL, CO2, GLUCOSE, BUN, CREATININE, CALCIUM in the last 72 hours.  PT/INR: No results for input(s): LABPROT, INR in the last 72 hours. ABG    Component Value Date/Time   PHART 7.416 11/13/2018 0420   HCO3 23.2 11/13/2018 0420   TCO2 20 (L) 11/03/2018 1807   ACIDBASEDEF 0.7 11/13/2018 0420   O2SAT 96.5 11/13/2018 0420   CBG (last 3)  No results for input(s): GLUCAP in the last 72 hours.  Assessment/Plan: S/P Procedure(s) (LRB): VIDEO ASSISTED THORACOSCOPY (VATS)/EMPYEMA, MINI THORACOTOMY (Left) DECORTICATION (Left)  1. CV- NSR, BP controlled, continue home Norvasc 2. Pulm- extensive sub q emphysema bilaterally with extension into neck and face, both eyes are swollen R>L... will need chest tube placement  3. Empyema- on Cipro, afebrile, for gram neg rod in pleural hematoma 4. Hypothyroidism, on Synthroid 5. Dispo- patient stable, no acute distress, patient will likely need chest tube, will discuss with Dr. Roxan Hockey   LOS: 22 days    Ellwood Handler 11/25/2018  CXR image reviewed and patient examined Will get CT chest and consult IR for ct guided pigtail catheter to help with subQ air patient examined and medical record reviewed,agree with above note. [name] 11/25/2018

## 2018-11-25 NOTE — Procedures (Signed)
Interventional Radiology Procedure:   Indications: Recurrent left pneumothorax  Procedure: CT guided left chest tube placement   Findings: 14 Fr drain placed and ptx nearly resolved  Complications: None     EBL: Less than 10 ml  Plan: Chest tube to wall suction at 20 cm.    Raylyn Speckman R. Anselm Pancoast, MD  Pager: [phone]

## 2018-11-26 ENCOUNTER — Inpatient Hospital Stay (HOSPITAL_COMMUNITY): Payer: Medicaid Other

## 2018-11-26 IMAGING — DX PORTABLE CHEST - 1 VIEW
2 series · 2 of 2 positions shown · non-contrast
Comparison: [DATE]

CLINICAL DATA: Subcutaneous air

EXAM:
PORTABLE CHEST 1 VIEW

[chest ap (1 of 2)]
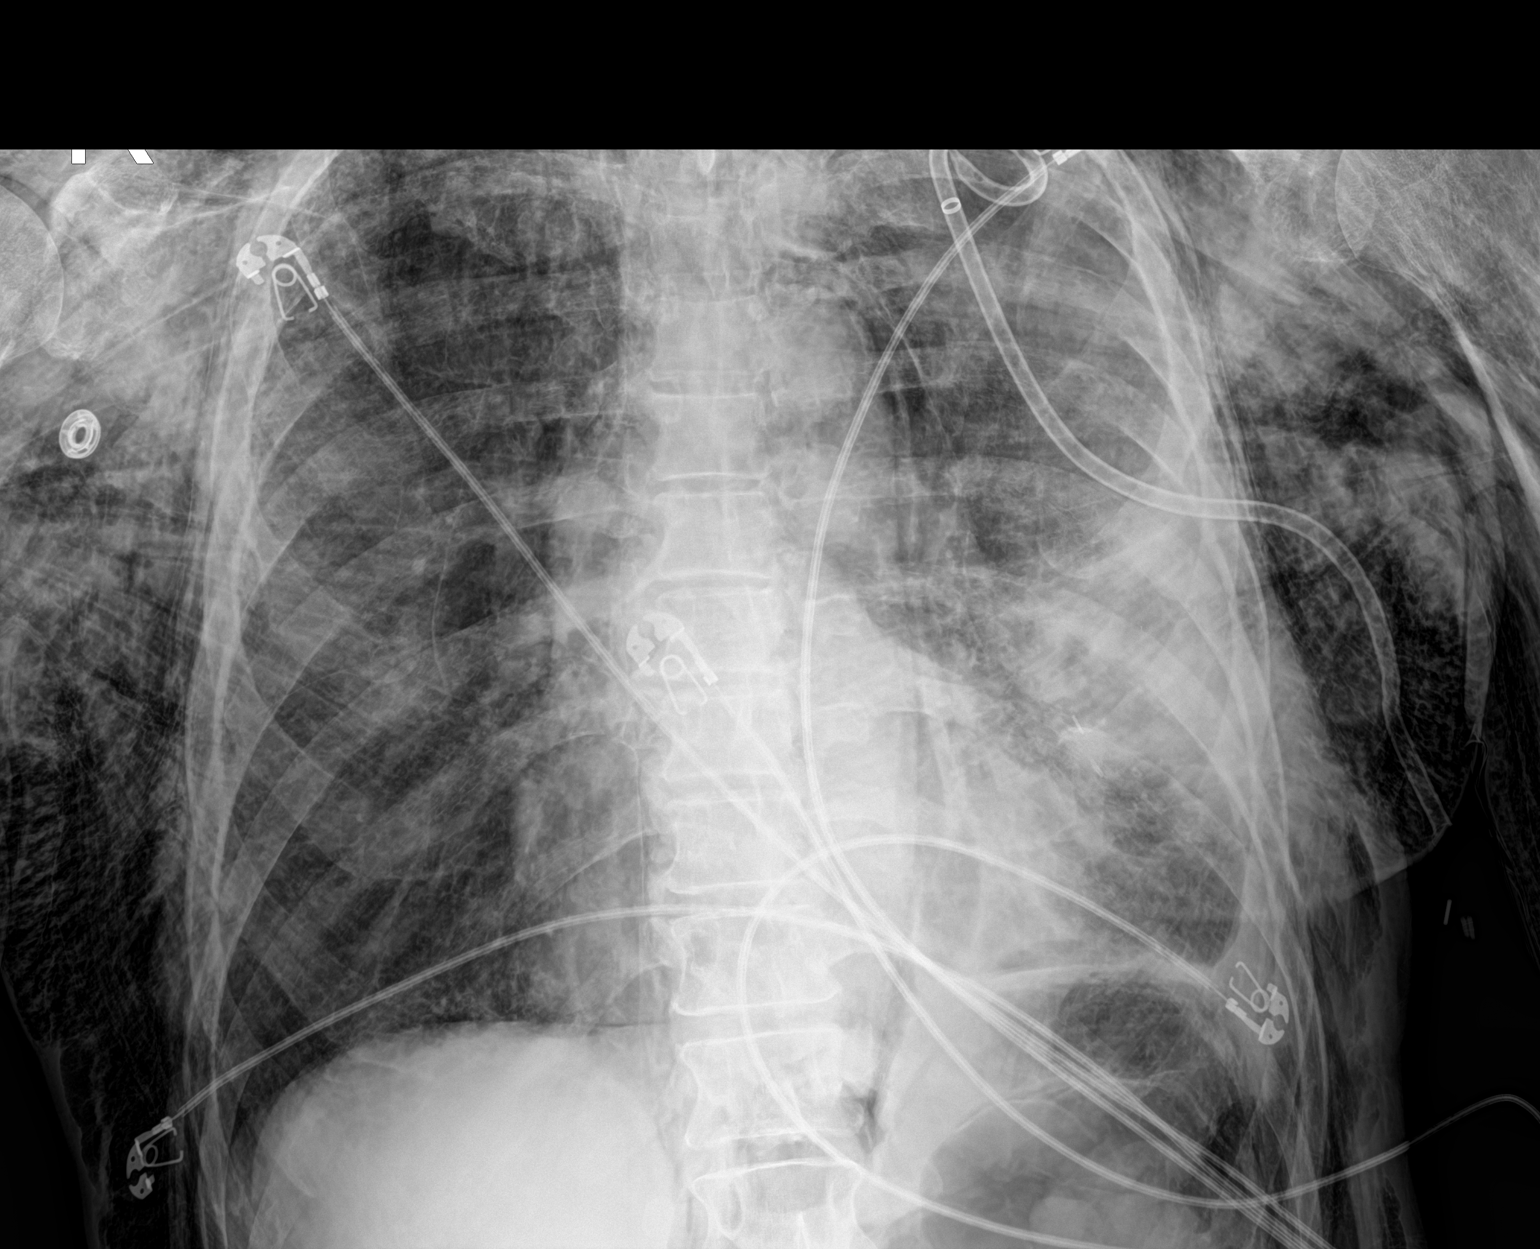

[chest ap (2 of 2)]
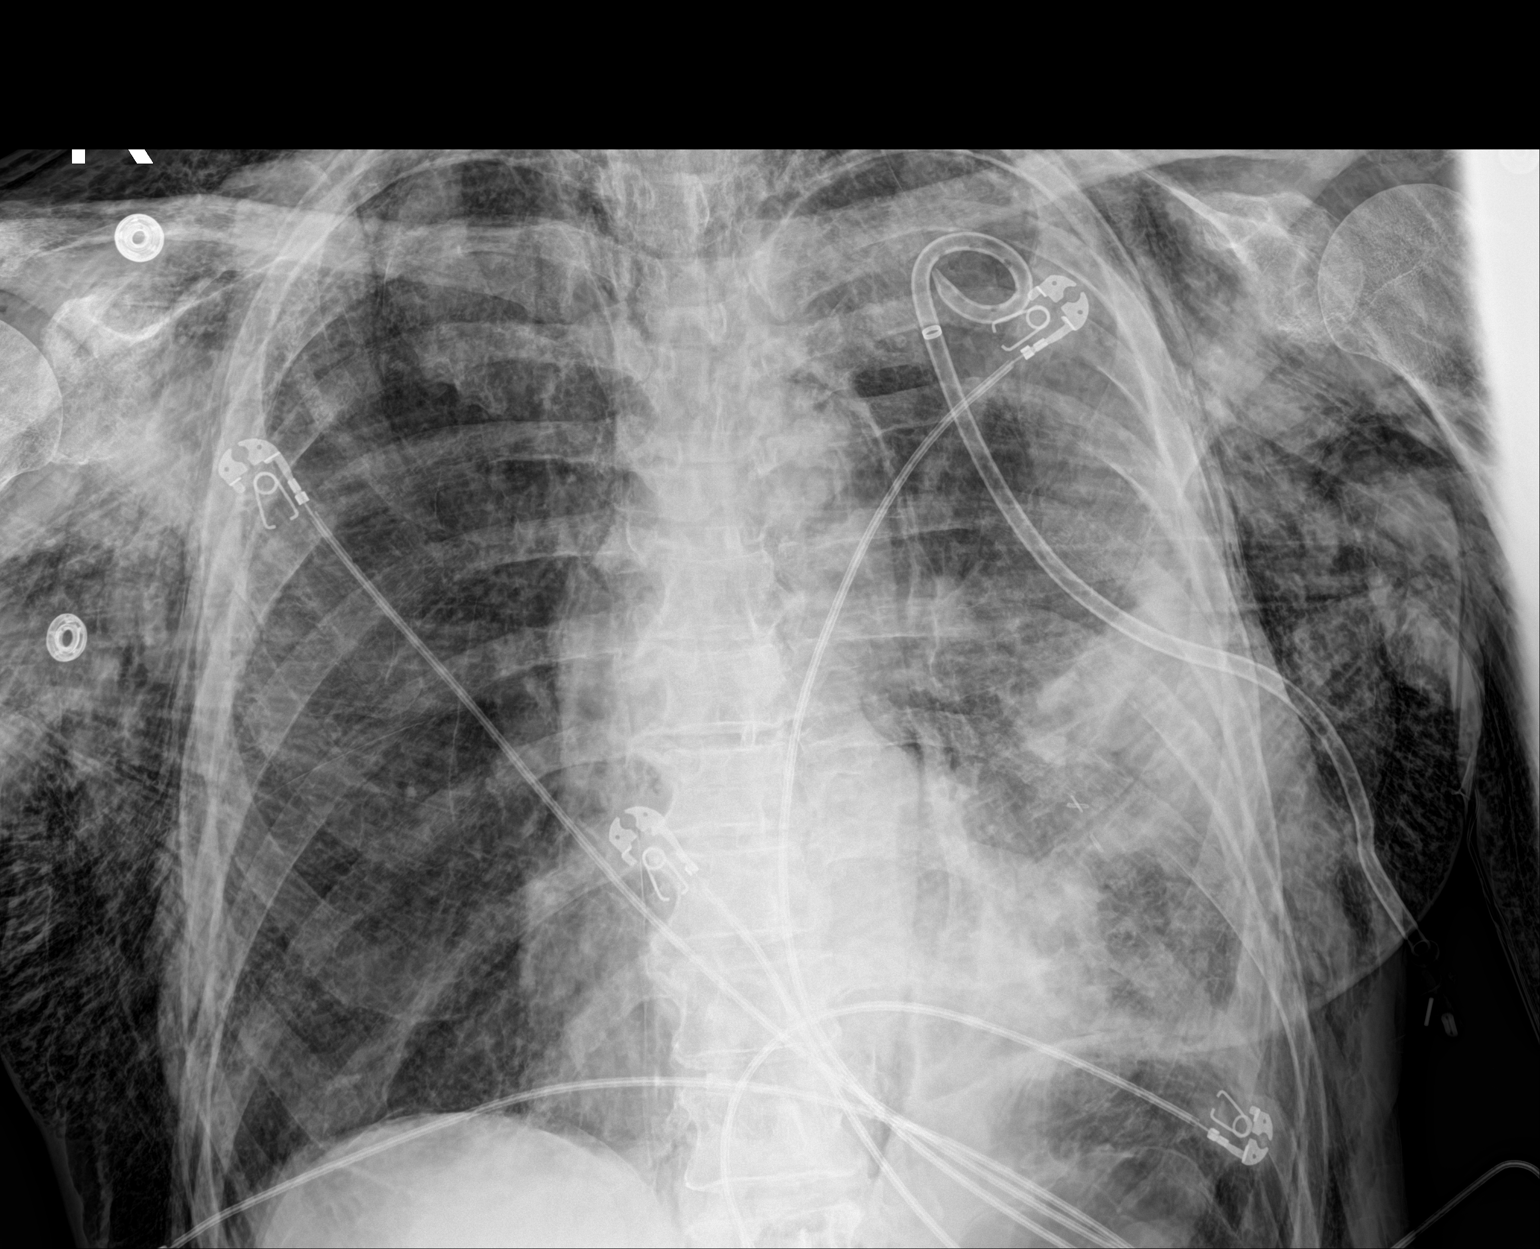

[2 of 2 positions shown; findings below may reference images not displayed]

FINDINGS: Left chest tube remains in place. Mediastinal air again noted,
decreased in size since prior study. Probable small right apical
pneumothorax. Diffuse subcutaneous emphysema throughout the chest
wall bilaterally. Airspace disease in the left mid and lower lung,
stable. Small left effusion.
IMPRESSION: Extensive subcutaneous emphysema, stable. Small right apical
pneumothorax.

Pneumomediastinum, decreased since prior study.

Stable left mid and lower lung airspace disease and small left
effusion.

## 2018-11-26 IMAGING — DX PORTABLE CHEST - 1 VIEW
1 series · 1 of 1 positions shown · non-contrast
Comparison: [DATE]

CLINICAL DATA: Pneumothorax

EXAM:
PORTABLE CHEST 1 VIEW

[chest]
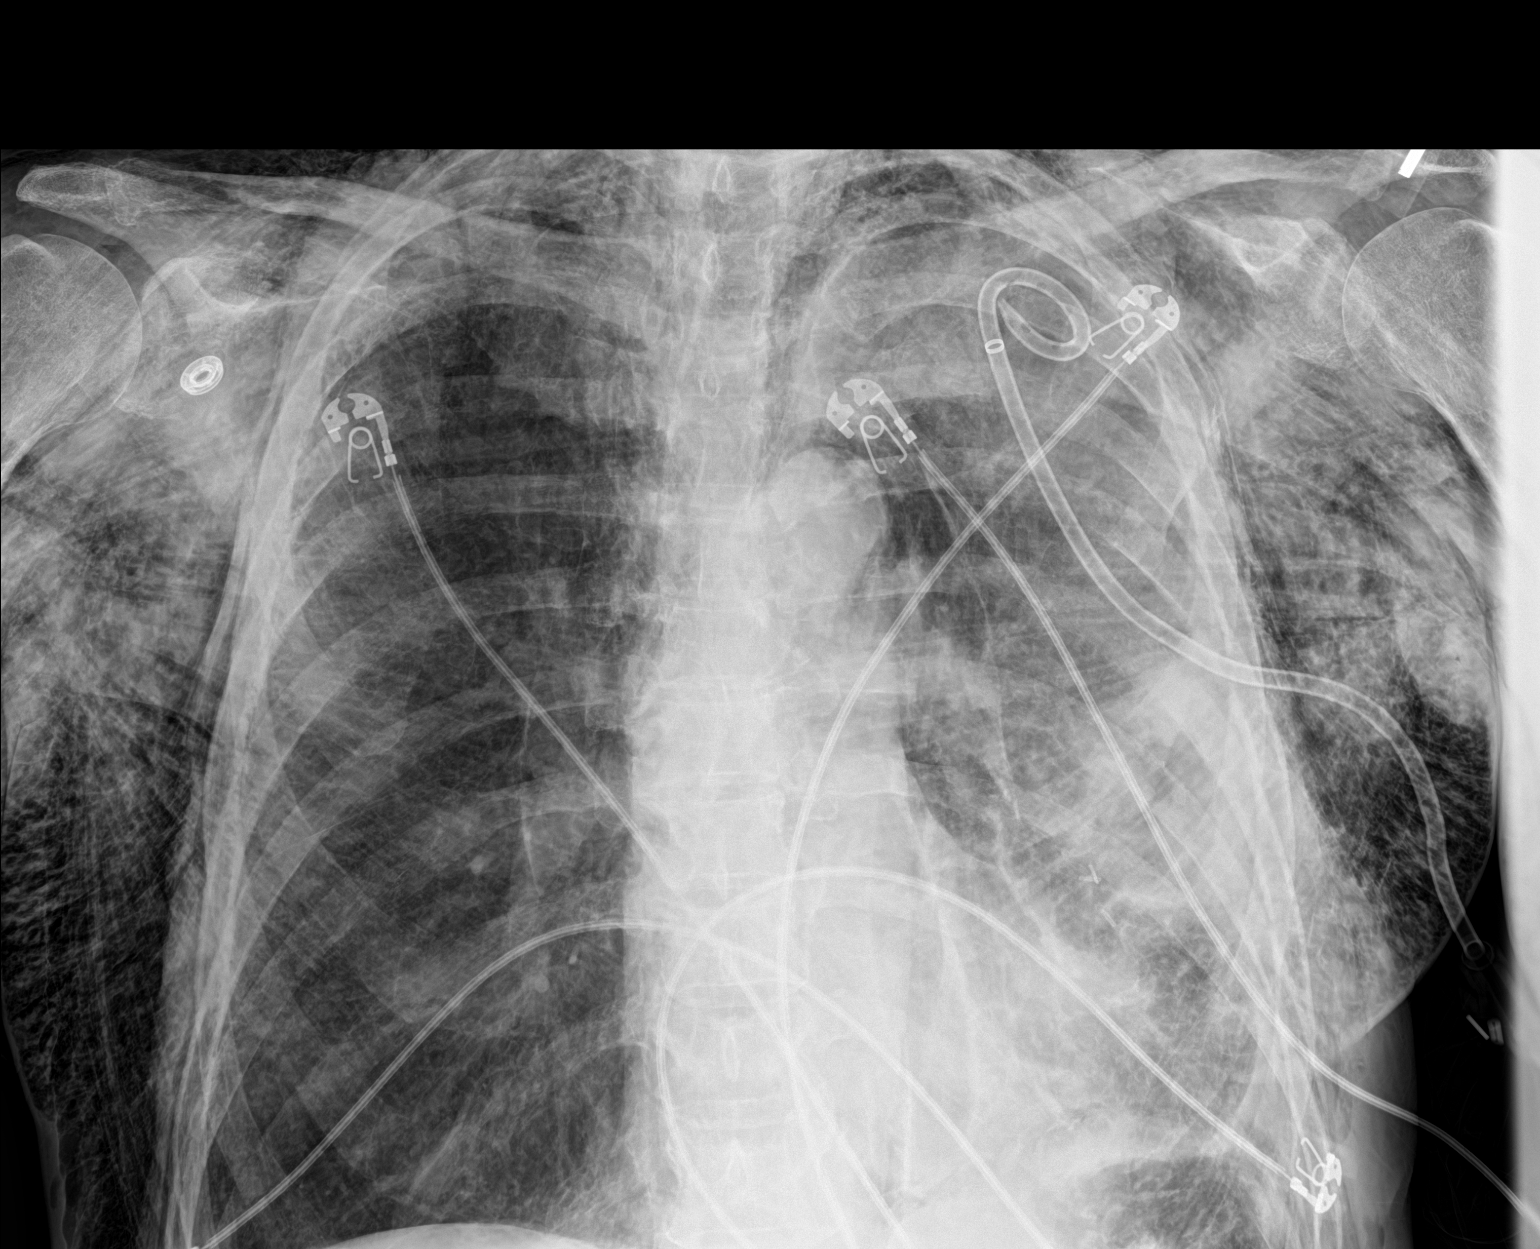

[1 of 1 positions shown; findings below may reference images not displayed]

FINDINGS: Pigtail left chest tube has been placed. Tip projects over the left
lung apex. There is gas superior to the left lung apex which is
either mediastinal or a small pneumothorax. True mediastinal gas is
present and remains prominent there is no evidence of right
pneumothorax. Extensive subcutaneous emphysema persists. Normal
heart size. Opacities throughout the left lung are stable.
IMPRESSION: Interval left pigtail chest tube placement. There is either a small
left apical pneumothorax or subcutaneous emphysema tracking over the
left lung.

Subcutaneous and mediastinal emphysema persists.

Left lung opacities are stable.

## 2018-11-26 NOTE — Progress Notes (Signed)
Patient ID: CEDARIUS KERSH, male   DOB: [birth year], 59 y.o.   MRN: [id number]  IR Round Note via phone Secondary new regulations   4/20 procedure Indications: Recurrent left pneumothorax Procedure: CT guided left chest tube placement Findings: 14 Fr drain placed and ptx nearly resolved Complications: None EBL: Less than 10 ml Plan: Chest tube to wall suction at 20 cm  OP 90 in Pleurvac Chest tube intact per RN + air leak Site is clean and dry NT  Followed by Dr Grandville Silos and Dr Prescott Gum

## 2018-11-26 NOTE — Progress Notes (Addendum)
      [address]       [place] [address]             [phone]      14 Days Post-Op Procedure(s) (LRB): VIDEO ASSISTED THORACOSCOPY (VATS)/EMPYEMA, MINI THORACOTOMY (Left) DECORTICATION (Left) Subjective: Eyes are not as puffy today, however he still has extensive subcutaneous emphysema extending to his face, neck, arms, hands, and chest. His voice is still compromised.   Objective: Vital signs in last 24 hours: Temp:  [97.6 F (36.4 C)-98.2 F (36.8 C)] 98.2 F (36.8 C) (04/21 0703) Pulse Rate:  [84-99] 99 (04/21 0703) Cardiac Rhythm: Normal sinus rhythm (04/20 1900) Resp:  [14-20] 18 (04/21 0703) BP: (115-132)/(78-84) 121/80 (04/21 0703) SpO2:  [95 %-98 %] 95 % (04/21 0703)     Intake/Output from previous day: 04/20 0701 - 04/21 0700 In: -  Out: 740 [Urine:700; Chest Tube:40] Intake/Output this shift: Total I/O In: -  Out: 95 [Urine:50; Chest Tube:45]  General appearance: alert, cooperative and no distress Heart: regular rate and rhythm, S1, S2 normal, no murmur, click, rub or gallop Lungs: clear to auscultation bilaterally and extensive subcutaneous emphysema Abdomen: soft, non-tender; bowel sounds normal; no masses,  no organomegaly Extremities: extremities normal, atraumatic, no cyanosis or edema Wound: clean and dry  Lab Results: No results for input(s): WBC, HGB, HCT, PLT in the last 72 hours. BMET: No results for input(s): NA, K, CL, CO2, GLUCOSE, BUN, CREATININE, CALCIUM in the last 72 hours.  PT/INR:  Recent Labs    11/25/18 1020  LABPROT 16.0*  INR 1.3*   ABG    Component Value Date/Time   PHART 7.416 11/13/2018 0420   HCO3 23.2 11/13/2018 0420   TCO2 20 (L) 11/03/2018 1807   ACIDBASEDEF 0.7 11/13/2018 0420   O2SAT 96.5 11/13/2018 0420   CBG (last 3)  No results for input(s): GLUCAP in the last 72 hours.  Assessment/Plan: S/P Procedure(s) (LRB): VIDEO ASSISTED THORACOSCOPY (VATS)/EMPYEMA, MINI THORACOTOMY (Left)  DECORTICATION (Left)  1. CV-NSR, BP well controlled. Continue Norvasc 2. Pulm- tolerating room air with good oxygen saturation. CT scan showed a left hydropneumothorax. CT guided drainage of a percutaneous catheter and pigtail placement yesterday by IR. Right pneumo improved however there is still extensive subcutaneous and mediastinal emphysema. There also may be a small left apical pneumo.  3. Empyema-on Cipro, afebrile. Last WBC 15.1 (4/18) 4. Hypothyroidism-on Synthroid 5. Pain control remains an issue at times but the oral oxycodone and IV dilaudid are helping.   Plan: The patient states that he does feel better this morning. His pigtail catheter remains on 20cm of suction. Will leave to suction until subcutaneous air resolves. Continue to monitor closely.      LOS: 23 days    Elgie Collard 11/26/2018 Placement of pigtail catheter for apical loculated pneumothorax April 20 Subcutaneous emphysema clinically improved Chest x-ray shows pigtail catheter in good position, some improvement in chest x-ray No air leak from pigtail catheter on 20 cm suction Plan- continue 20 cm of suction.  Suction can be discontinued transiently for trips to bathroom and physical therapy. Continue daily chest x-ray.  patient examined and medical record reviewed,agree with above note. [name] 11/26/2018
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
## 2018-11-27 ENCOUNTER — Inpatient Hospital Stay (HOSPITAL_COMMUNITY): Payer: Medicaid Other
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
## 2018-11-27 MED ORDER — LIP MEDEX EX OINT: TOPICAL_OINTMENT | CUTANEOUS | Status: DC | PRN

## 2018-11-27 MED ORDER — ERYTHROMYCIN 5 MG/GM OP OINT
TOPICAL_OINTMENT | Freq: Every day | OPHTHALMIC | Status: DC
  Administered 2018-11-27 – 2018-11-28 (×2): via OPHTHALMIC
  Filled 2018-11-27: qty 3.5
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
## 2018-11-27 NOTE — Progress Notes (Addendum)
      [address]       [place] [address]             [phone]       15 Days Post-Op Procedure(s) (LRB): VIDEO ASSISTED THORACOSCOPY (VATS)/EMPYEMA, MINI THORACOTOMY (Left) DECORTICATION (Left)  Subjective: Patient states had increased swelling late last night. Repeat CXR appeared stable. He states his breathing is ok this am and does not feel has increased swelling since suction increased late last night.  Objective: Vital signs in last 24 hours: Temp:  [97.5 F (36.4 C)-98.4 F (36.9 C)] 97.9 F (36.6 C) (04/22 0715) Pulse Rate:  [83-92] 83 (04/22 0715) Cardiac Rhythm: Normal sinus rhythm (04/21 1903) Resp:  [13-18] 18 (04/22 0715) BP: (107-126)/(76-86) 121/76 (04/22 0715) SpO2:  [95 %-97 %] 97 % (04/22 0715)     Intake/Output from previous day: 04/21 0701 - 04/22 0700 In: 480 [P.O.:480] Out: 595 [Urine:550; Chest Tube:45]   Physical Exam:  Cardiovascular: RRR Pulmonary: Clear to auscultation bilaterally. Significant subcutaneous emphysema bilateral chest, neck face, eyes Wounds: Clean and dry.  No erythema or signs of infection.   Lab Results: CBC: No results for input(s): WBC, HGB, HCT, PLT in the last 72 hours. BMET: No results for input(s): NA, K, CL, CO2, GLUCOSE, BUN, CREATININE, CALCIUM in the last 72 hours.  PT/INR:  Recent Labs    11/25/18 1020  LABPROT 16.0*  INR 1.3*   ABG:  INR: Will add last result for INR, ABG once components are confirmed Will add last 4 CBG results once components are confirmed  Assessment/Plan:  1. CV - SR in the 90's. On Amlodipine 5 mg daily 2.  Pulmonary - S/p CT guided 14 French chest tube place by IR on 04/20. On room air this am. Chest tube with 45 cc last 24 hours. Chest tube is to 40 cm suction and no air leak. CXR this am appears stable (persistent extensive subcutaneous emphysema and trace right). May be able to decrease suction soon but will defer to Dr. Prescott Gum. I informed patient  about being careful not to have tube kinked. Encourage incentive spirometer 3. Anemia-H and H 8.4 and 26.3. Continue ferrous sulfate and folic acid 4. Hypothyroidism-on Levothyroxine 175 mcg daily  Donielle M ZimmermanPA-C 11/27/2018,7:38 AM [phone]  Patient examined and images of Cxrs and chest CT in past 12 hrs reviewed Chest tube in good position, O2 sats normal No sig L pneumothorax after chest tube placed Leave chest tube to 40 cm suction today Patient would not benefit from another chest tube or reposition  of current tube  patient examined and medical record reviewed,agree with above note. [name] 11/27/2018

## 2018-11-27 NOTE — Progress Notes (Signed)
Patient ID: CORLISS LAMARTINA, male   DOB: 20-Aug-1960, 58 y.o.   MRN: [id number]   IR Round Note via phone Secondary new regulations   4/20 procedure Indications:Recurrent left pneumothorax Procedure:CT guided left chest tube placement Findings:14 Fr drain placed and ptx nearly resolved Complications:None WUJ:WJXB than 10 ml Plan:Chest tube to wall suction at 20 cm  OP 90 cc in Pleurvac Chest tube intact per RN No air leak today Increased suction to -40 per Dr Darcey Nora Site is clean and dry NT  CXR today: IMPRESSION: Suggestion of gradually improving subcutaneous emphysema throughout the chest.   Followed by Dr Grandville Silos and Dr Prescott Gum
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
## 2018-11-28 ENCOUNTER — Inpatient Hospital Stay (HOSPITAL_COMMUNITY): Payer: Medicaid Other

## 2018-11-28 LAB — CBC
HCT: 26.8 % — ABNORMAL LOW (ref 39.0–52.0)
Hemoglobin: 9.1 g/dL — ABNORMAL LOW (ref 13.0–17.0)
MCH: 32.5 pg (ref 26.0–34.0)
MCHC: 34 g/dL (ref 30.0–36.0)
MCV: 95.7 fL (ref 80.0–100.0)
Platelets: 619 10*3/uL — ABNORMAL HIGH (ref 150–400)
RBC: 2.8 MIL/uL — ABNORMAL LOW (ref 4.22–5.81)
RDW: 13.7 % (ref 11.5–15.5)
WBC: 10.9 10*3/uL — ABNORMAL HIGH (ref 4.0–10.5)
nRBC: 0 % (ref 0.0–0.2)

## 2018-11-28 LAB — BASIC METABOLIC PANEL
Anion gap: 11 (ref 5–15)
BUN: 12 mg/dL (ref 6–20)
CO2: 23 mmol/L (ref 22–32)
Calcium: 8.7 mg/dL — ABNORMAL LOW (ref 8.9–10.3)
Chloride: 100 mmol/L (ref 98–111)
Creatinine, Ser: 0.94 mg/dL (ref 0.61–1.24)
GFR calc Af Amer: >60 mL/min (ref >60)
GFR calc non Af Amer: >60 mL/min (ref >60)
Glucose, Bld: 90 mg/dL (ref 70–99)
Potassium: 4.2 mmol/L (ref 3.5–5.1)
Sodium: 134 mmol/L — ABNORMAL LOW (ref 135–145)
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
## 2018-11-28 NOTE — Progress Notes (Signed)
Patient ID: [name], male   DOB: 08-Jun-1961, 58 y.o.   MRN: [id number]   IR Round Note via phone Secondary new regulations   4/20 procedure Indications:Recurrent left pneumothorax Procedure:CT guided left chest tube placement Findings:14 Fr drain placed and ptx nearly resolved Complications:None CYO:YOOJ than 10 ml Plan:Chest tube to wall suction at 20 cm   Pt looks some better today per RN  Chest tube intact  Site is clean and dry NT  CXR today: IMPRESSION: No change in the appearance of the chest. Small bilateral pneumothoraces, airspace opacity in the left mid lung zone, small left effusion and extensive subcutaneous emphysema are again seen.   Followed by Dr Grandville Silos and Dr Prescott Gum
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
## 2018-11-29 ENCOUNTER — Inpatient Hospital Stay (HOSPITAL_COMMUNITY): Payer: Medicaid Other

## 2018-11-29 IMAGING — DX PORTABLE CHEST - 1 VIEW
1 series · 1 of 1 positions shown · non-contrast
Comparison: Radiograph [DATE].

CLINICAL DATA: Pneumothorax.

EXAM:
PORTABLE CHEST 1 VIEW

[chest ap]
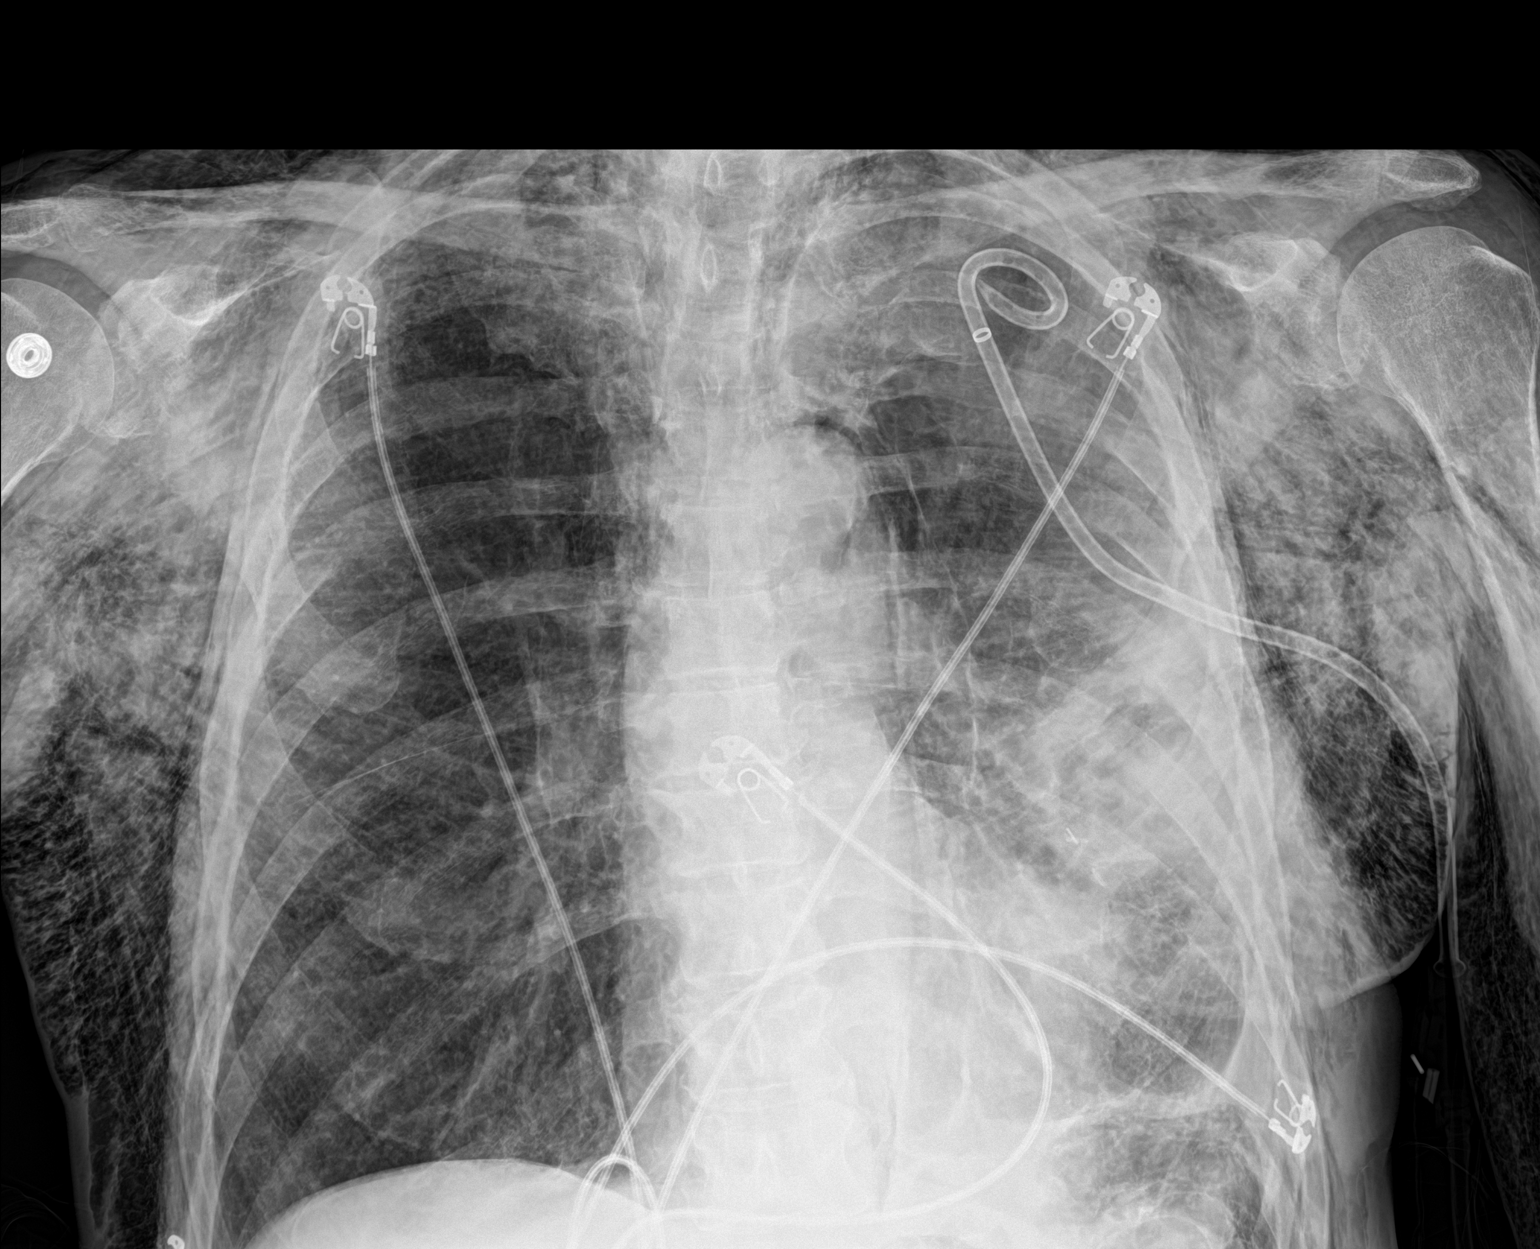

[1 of 1 positions shown; findings below may reference images not displayed]

FINDINGS: Stable cardiomediastinal silhouette. Stable position of left-sided
chest tube. Stable small bilateral pneumothoraces are noted. Stable
probable pneumomediastinum. Stable extensive subcutaneous emphysema
is noted bilaterally as well. Stable left lung opacity is noted
laterally in left midlung. Bony thorax is unremarkable.
IMPRESSION: Stable small bilateral pneumothoraces are noted, as well as probable
pneumomediastinum. Stable extensive bilateral subcutaneous emphysema
is noted. Stable position of left-sided chest tube. Stable left lung
opacity as described above.
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
## 2018-11-29 NOTE — Progress Notes (Addendum)
      [address]       [place] [address]             [phone]       17 Days Post-Op Procedure(s) (LRB): VIDEO ASSISTED THORACOSCOPY (VATS)/EMPYEMA, MINI THORACOTOMY (Left) DECORTICATION (Left)  Subjective: Patient sleeping-awakened this am. He states his breathing is fine.  Objective: Vital signs in last 24 hours: Temp:  [97.7 F (36.5 C)-98.4 F (36.9 C)] 98.1 F (36.7 C) (04/24 0727) Pulse Rate:  [78-88] 78 (04/24 0727) Cardiac Rhythm: Normal sinus rhythm (04/24 0700) Resp:  [15] 15 (04/24 0727) BP: (114-124)/(75-79) 124/79 (04/24 0727) SpO2:  [96 %-98 %] 97 % (04/24 0727)     Intake/Output from previous day: 04/23 0701 - 04/24 0700 In: 720 [P.O.:720] Out: 470 [Urine:450; Chest Tube:20]   Physical Exam:  Cardiovascular: RRR Pulmonary: Clear to auscultation bilaterally. Subcutaneous emphysema bilateral chest, neck face (improved since last seen several days ago), and eyes (more open than in days past) Wounds: Clean and dry.  No erythema or signs of infection. Chest tube: to suction, no air leak  Lab Results: CBC: Recent Labs    11/28/18 0300  WBC 10.9*  HGB 9.1*  HCT 26.8*  PLT 619*   BMET:  Recent Labs    11/28/18 0300  NA 134*  K 4.2  CL 100  CO2 23  GLUCOSE 90  BUN 12  CREATININE 0.94  CALCIUM 8.7*    PT/INR: No results for input(s): LABPROT, INR in the last 72 hours. ABG:  INR: Will add last result for INR, ABG once components are confirmed Will add last 4 CBG results once components are confirmed  Assessment/Plan:  1. CV - SR in the 90's. On Amlodipine 5 mg daily 2.  Pulmonary - On room air this am. Chest tube is to 40 cm suction and there is NO air leak. CXR this am appears stable (persistent extensive subcutaneous emphysema, stable small left pneumothorax and, ? trace right). Encourage incentive spirometer 3. Anemia-Last H and H stable at 9.1 and 26.8. Continue ferrous sulfate and folic acid 4.  Hypothyroidism-on Levothyroxine 175 mcg daily  Donielle M ZimmermanPA-C 11/29/2018,7:59 AM [phone]   reduce chest tube suction to 20 cm water CXR in am patient examined and medical record reviewed,agree with above note. [name] 11/29/2018

## 2018-11-29 NOTE — Progress Notes (Signed)
IR rounding note via telephone per new regulations. Spoke with Elmyra Ricks, RN.  Patient with recurrent left pneumothorax s/p left chest tube placement 11/25/2018 by Dr. Anselm Pancoast.  RN reports patient's SQ emphysema has improved since yesterday. Also states left chest tube site c/d/i with approximately 100 cc fluid in PleurVac. (-) air leak. Suction set to 40 cm.  TCTS and CCS also following- appreciate and agree with management. IR to follow.  Bea Graff Harumi Yamin, PA-C 11/29/2018, 10:26 AM

## 2018-11-30 ENCOUNTER — Inpatient Hospital Stay (HOSPITAL_COMMUNITY): Payer: Medicaid Other

## 2018-11-30 IMAGING — DX PORTABLE CHEST - 1 VIEW
2 series · 2 of 2 positions shown · non-contrast
Comparison: [DATE].

CLINICAL DATA: Pneumothorax.

EXAM:
PORTABLE CHEST 1 VIEW

[chest ap (1 of 2)]
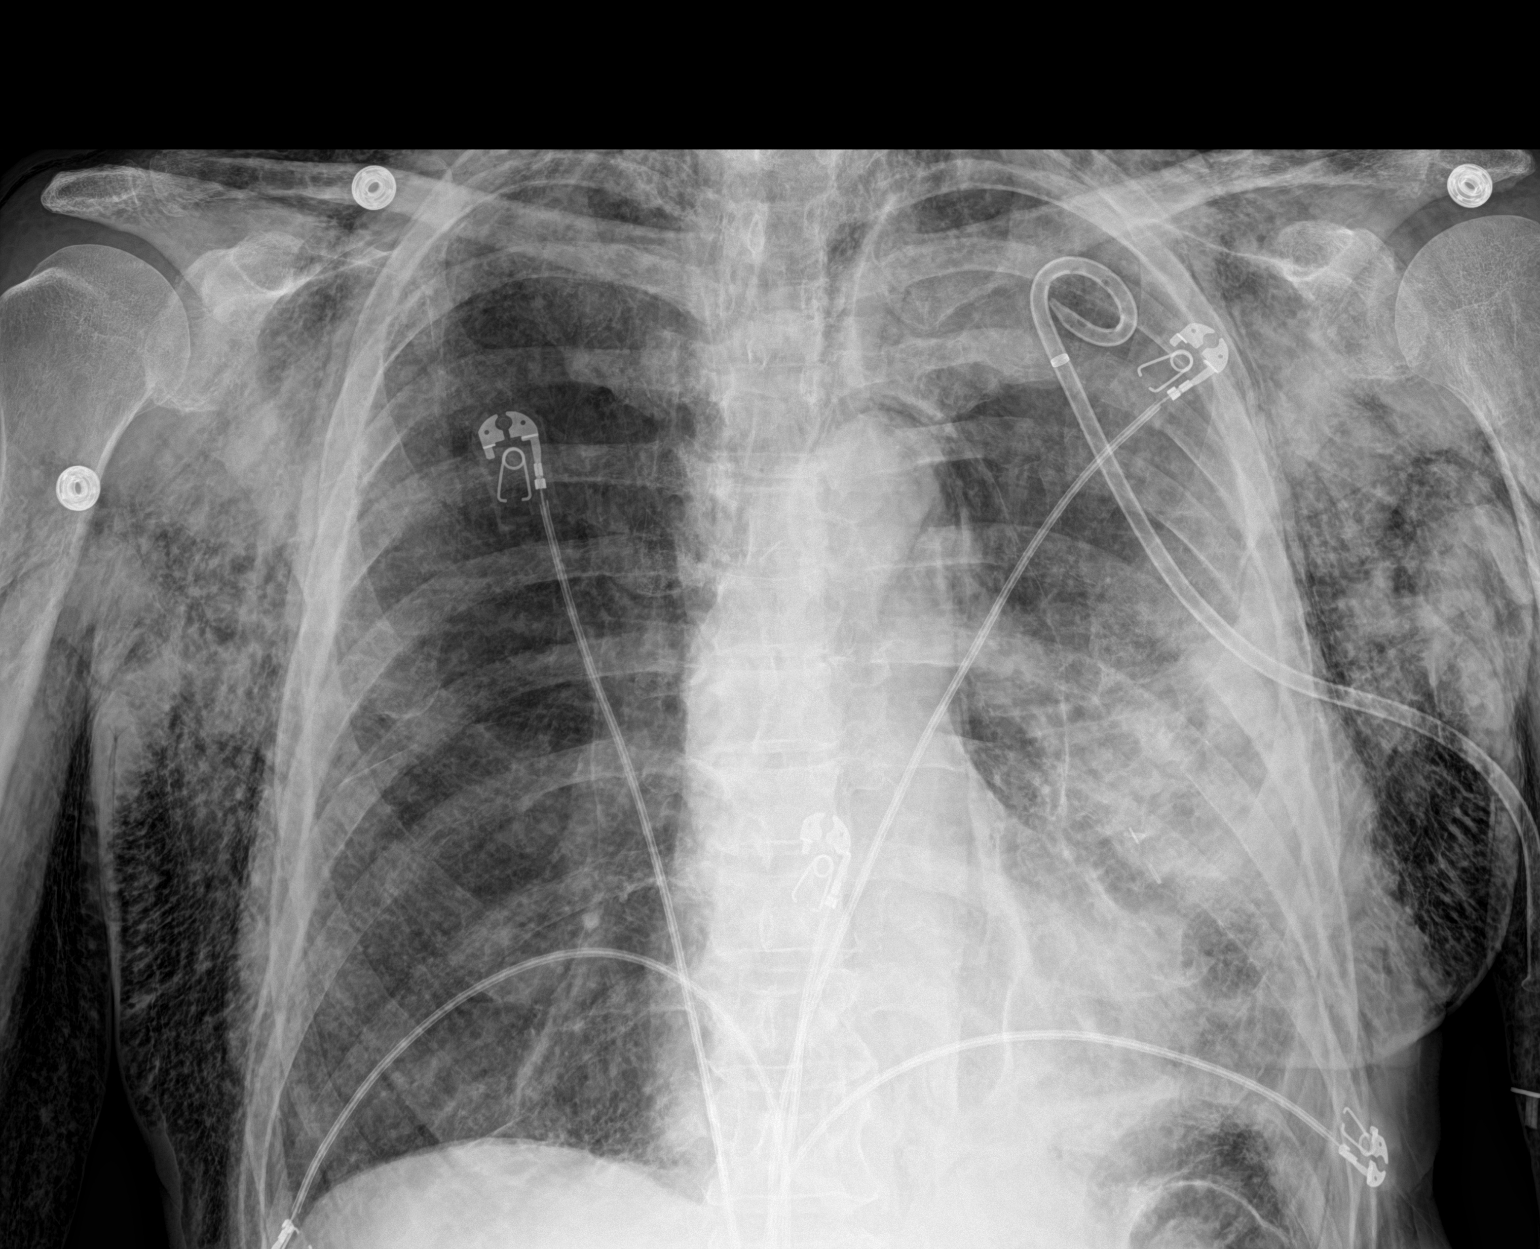

[chest ap (2 of 2)]
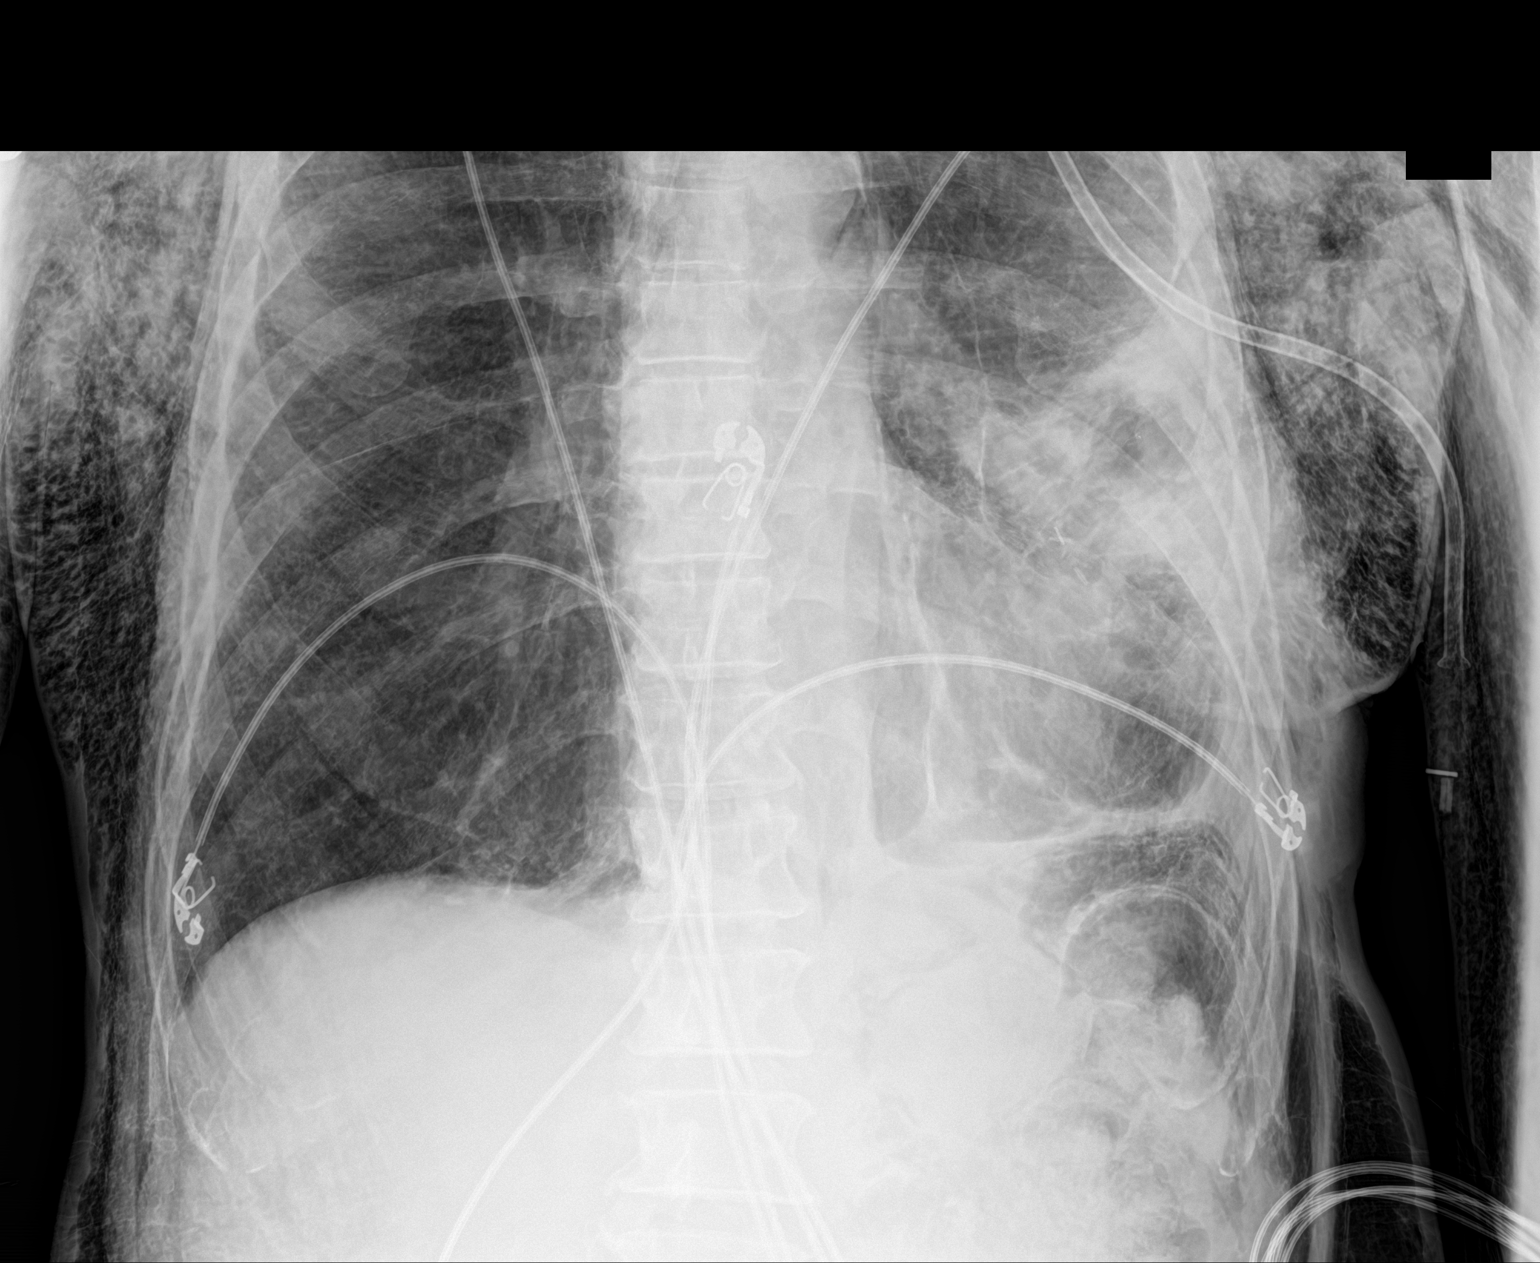

[2 of 2 positions shown; findings below may reference images not displayed]

FINDINGS: Pigtail catheter LEFT hemithorax unchanged. Small LEFT apical
pneumothorax overlaps the rib, less than 5%, probably stable. RIGHT
pneumothorax more difficult to clearly delineate, probably
unchanged. Extensive subcutaneous air and pneumomediastinum.
IMPRESSION: Stable chest.

## 2018-11-30 MED ORDER — DOCUSATE SODIUM 100 MG PO CAPS
100.0000 mg | ORAL_CAPSULE | Freq: Two times a day (BID) | ORAL | Status: DC
  Administered 2018-11-30 – 2018-12-09 (×4): 100 mg via ORAL
  Filled 2018-11-30 (×9): qty 1

## 2018-11-30 NOTE — Progress Notes (Signed)
18 Days Post-Op Procedure(s) (LRB): VIDEO ASSISTED THORACOSCOPY (VATS)/EMPYEMA, MINI THORACOTOMY (Left) DECORTICATION (Left) Subjective: No complaints this Am  Objective: Vital signs in last 24 hours: Temp:  [97.8 F (36.6 C)-99.4 F (37.4 C)] 98.1 F (36.7 C) (04/25 0714) Pulse Rate:  [74-88] 85 (04/25 0714) Cardiac Rhythm: Normal sinus rhythm (04/25 0818) Resp:  [14-17] 14 (04/25 0714) BP: (108-124)/(63-82) 110/70 (04/25 0714) SpO2:  [94 %-100 %] 94 % (04/25 0714)  Hemodynamic parameters for last 24 hours:    Intake/Output from previous day: 04/24 0701 - 04/25 0700 In: 740 [P.O.:740] Out: 900 [Urine:900] Intake/Output this shift: Total I/O In: 120 [P.O.:120] Out: -   General appearance: alert, cooperative, no distress and SQ emphysema Neurologic: intact Heart: regular rate and rhythm Lungs: clear to auscultation bilaterally small air leak  Lab Results: Recent Labs    11/28/18 0300  WBC 10.9*  HGB 9.1*  HCT 26.8*  PLT 619*   BMET:  Recent Labs    11/28/18 0300  NA 134*  K 4.2  CL 100  CO2 23  GLUCOSE 90  BUN 12  CREATININE 0.94  CALCIUM 8.7*    PT/INR: No results for input(s): LABPROT, INR in the last 72 hours. ABG    Component Value Date/Time   PHART 7.416 11/13/2018 0420   HCO3 23.2 11/13/2018 0420   TCO2 20 (L) 11/03/2018 1807   ACIDBASEDEF 0.7 11/13/2018 0420   O2SAT 96.5 11/13/2018 0420   CBG (last 3)  No results for input(s): GLUCAP in the last 72 hours.   PORTABLE CHEST 1 VIEW  COMPARISON:  11/29/2018.  FINDINGS: Pigtail catheter LEFT hemithorax unchanged. Small LEFT apical pneumothorax overlaps the rib, less than 5%, probably stable. RIGHT pneumothorax more difficult to clearly delineate, probably unchanged. Extensive subcutaneous air and pneumomediastinum.  IMPRESSION: Stable chest.   Electronically Signed   By: Staci Righter M.D.   On: 11/30/2018 08:03  Assessment/Plan: S/P Procedure(s) (LRB): VIDEO ASSISTED  THORACOSCOPY (VATS)/EMPYEMA, MINI THORACOTOMY (Left) DECORTICATION (Left) - CXR stable, minimal left pneumothorax. CT with minimal air leak Will try on 10 cm suction today   LOS: 27 days    Melrose Nakayama 11/30/2018
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
## 2018-12-01 ENCOUNTER — Inpatient Hospital Stay (HOSPITAL_COMMUNITY): Payer: Medicaid Other

## 2018-12-01 IMAGING — DX PORTABLE CHEST - 1 VIEW
2 series · 2 of 2 positions shown · non-contrast
Comparison: [DATE].

CLINICAL DATA: Follow-up pneumothorax.

EXAM:
PORTABLE CHEST 1 VIEW

[chest ap (1 of 2)]
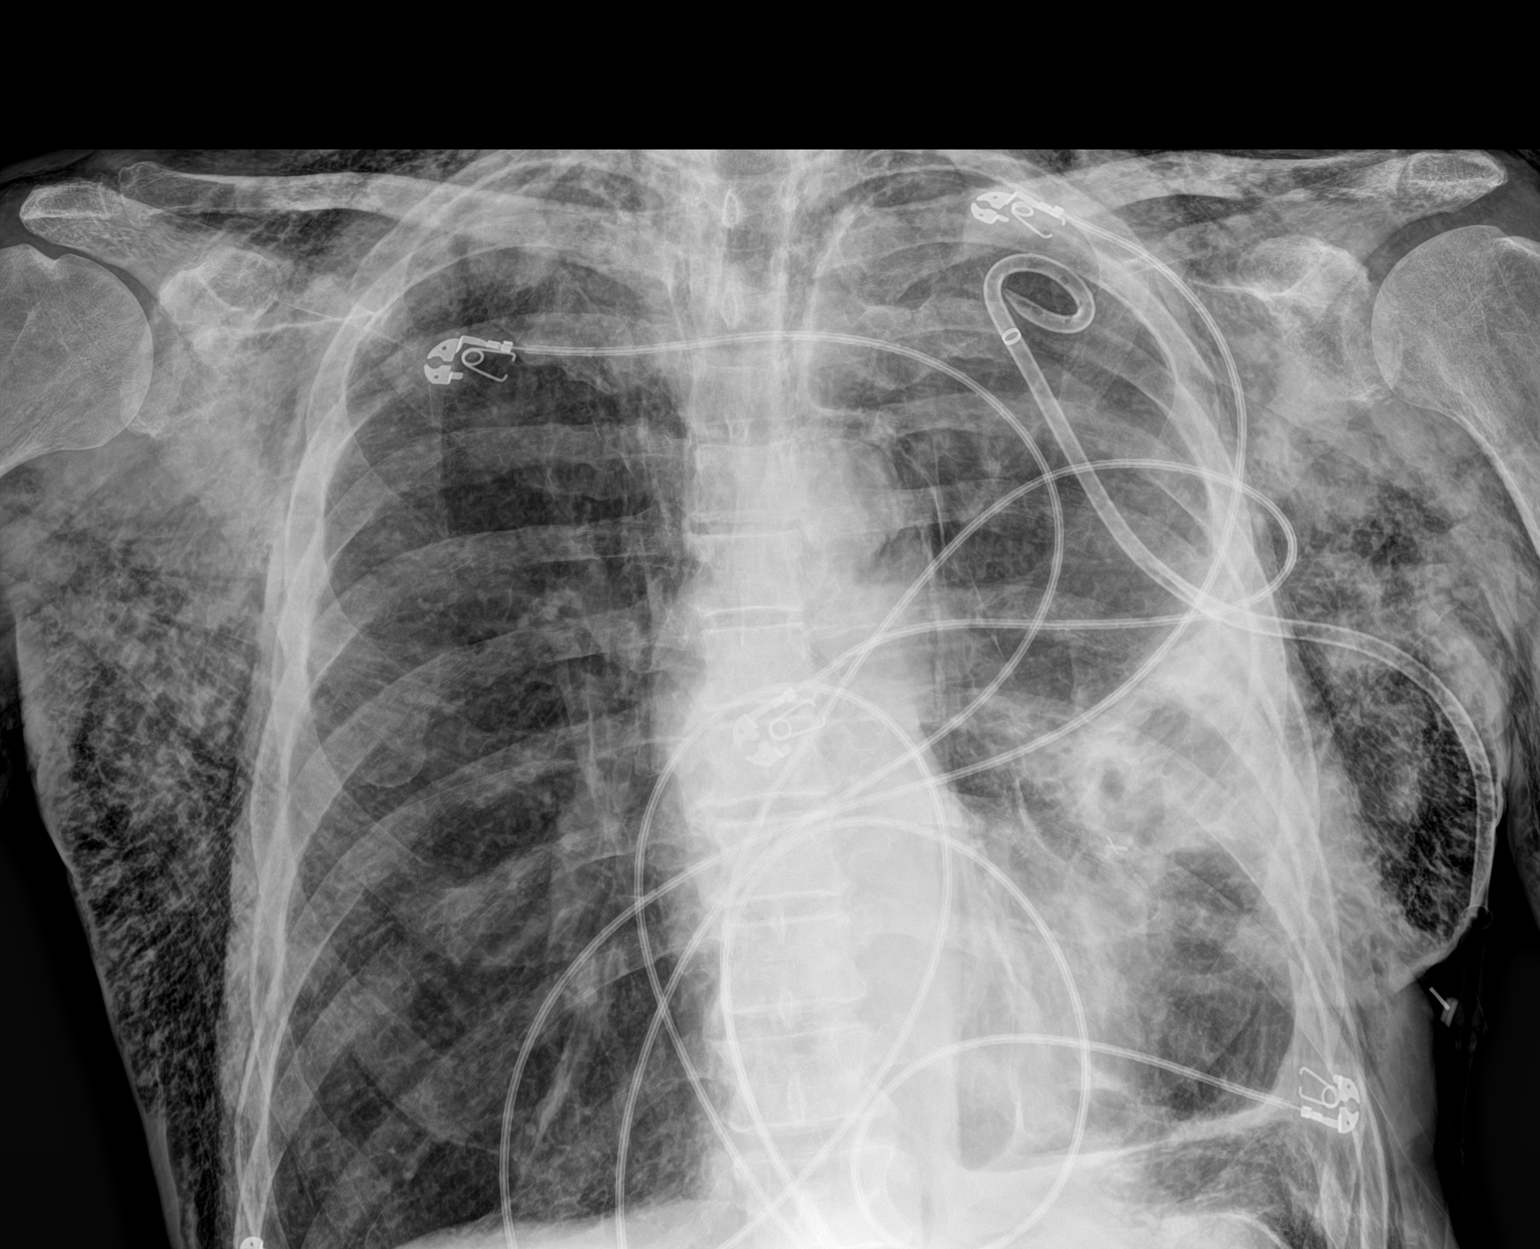

[chest ap (2 of 2)]
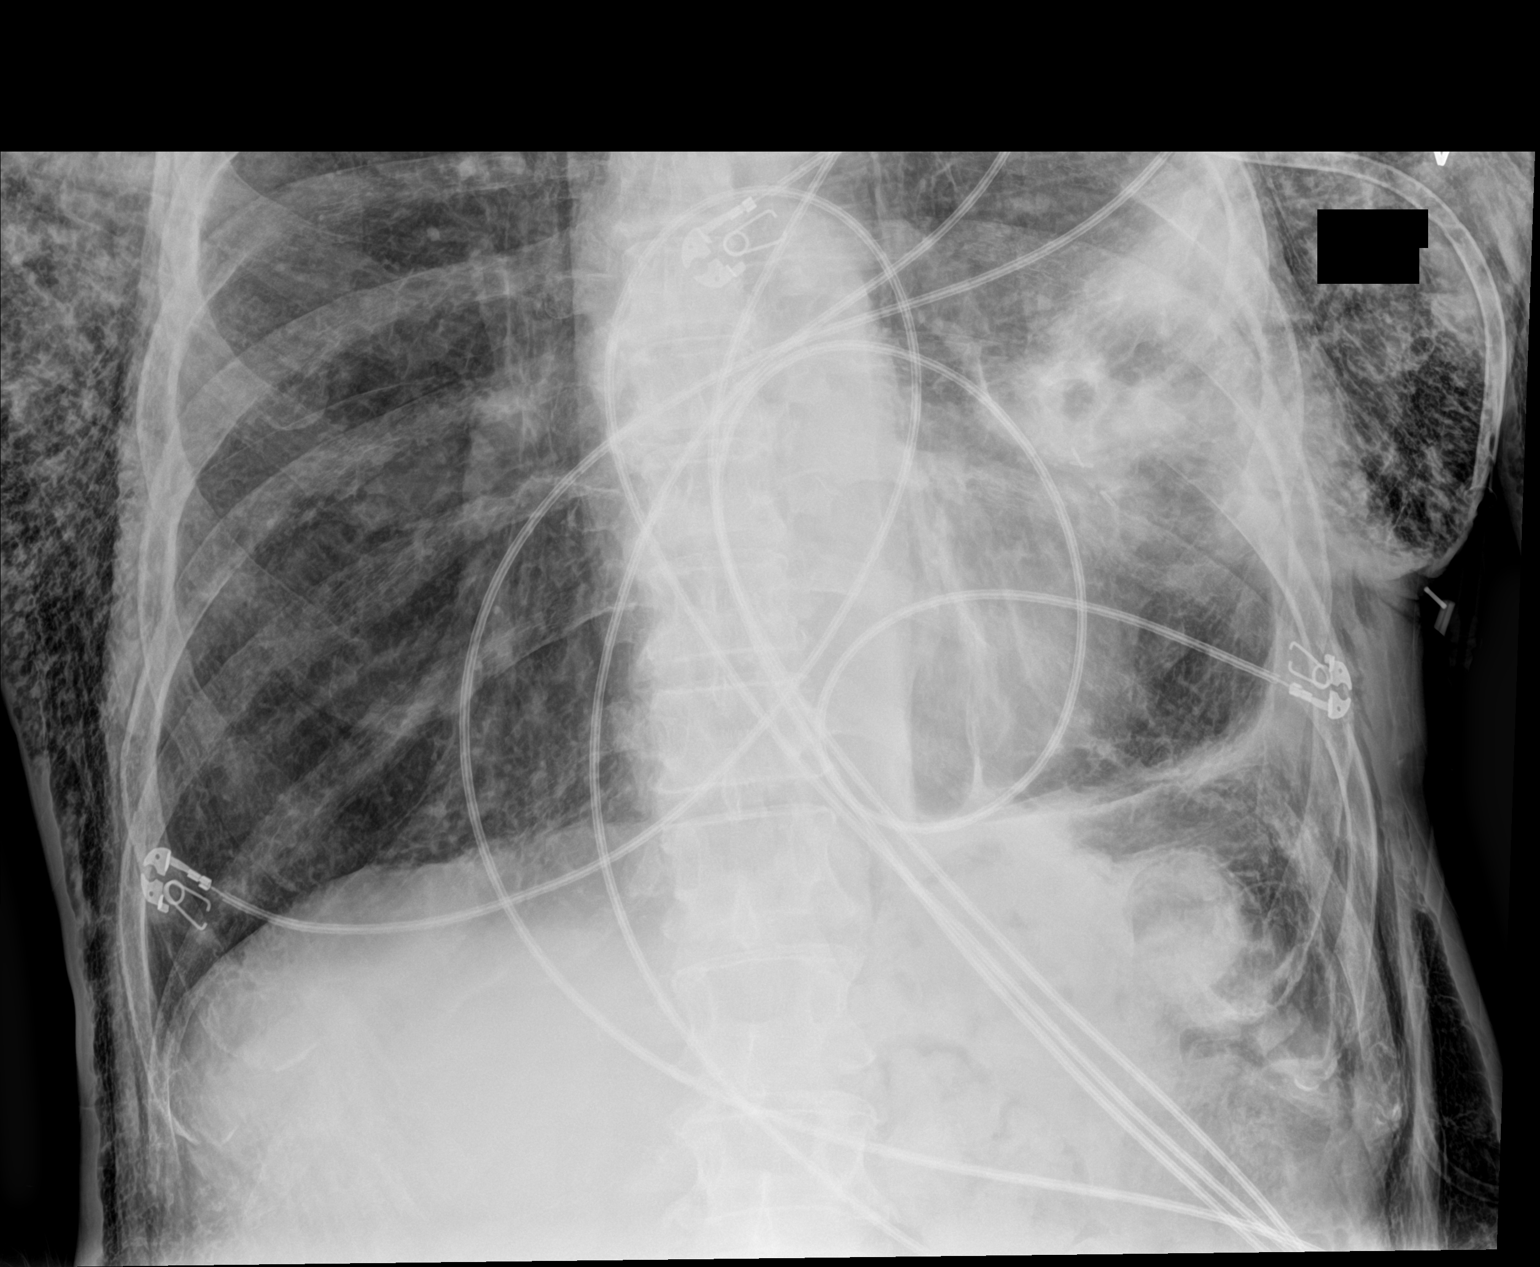

[2 of 2 positions shown; findings below may reference images not displayed]

FINDINGS: LEFT pigtail catheter good position. Small BILATERAL apical
pneumothoraces, appear stable. Pneumomediastinum and subcutaneous
emphysema, are redemonstrated, not significantly worse. Normal heart
size. No ET tube or NG tube.
IMPRESSION: Stable chest.

## 2018-12-01 MED ORDER — DIPHENHYDRAMINE HCL 25 MG PO CAPS
25.0000 mg | ORAL_CAPSULE | Freq: Four times a day (QID) | ORAL | Status: DC | PRN
  Administered 2018-12-02 (×3): 25 mg via ORAL
  Filled 2018-12-01 (×3): qty 1

## 2018-12-01 NOTE — Progress Notes (Signed)
19 Days Post-Op Procedure(s) (LRB): VIDEO ASSISTED THORACOSCOPY (VATS)/EMPYEMA, MINI THORACOTOMY (Left) DECORTICATION (Left) Subjective: No new complaints, not wanting to interact today  Objective: Vital signs in last 24 hours: Temp:  [97.8 F (36.6 C)-98.3 F (36.8 C)] 97.8 F (36.6 C) (04/26 0712) Pulse Rate:  [68-84] 68 (04/26 0712) Cardiac Rhythm: Normal sinus rhythm (04/25 1905) Resp:  [15-18] 16 (04/26 0712) BP: (105-125)/(63-80) 112/73 (04/26 0712) SpO2:  [92 %-97 %] 92 % (04/26 0712)  Hemodynamic parameters for last 24 hours:    Intake/Output from previous day: 04/25 0701 - 04/26 0700 In: 600 [P.O.:600] Out: 1075 [Urine:1075] Intake/Output this shift: No intake/output data recorded.   PE Alert no distress Not cooperative with exam, but SQ air appears stable No air leak at present  Lab Results: No results for input(s): WBC, HGB, HCT, PLT in the last 72 hours. BMET: No results for input(s): NA, K, CL, CO2, GLUCOSE, BUN, CREATININE, CALCIUM in the last 72 hours.  PT/INR: No results for input(s): LABPROT, INR in the last 72 hours. ABG    Component Value Date/Time   PHART 7.416 11/13/2018 0420   HCO3 23.2 11/13/2018 0420   TCO2 20 (L) 11/03/2018 1807   ACIDBASEDEF 0.7 11/13/2018 0420   O2SAT 96.5 11/13/2018 0420   CBG (last 3)  No results for input(s): GLUCAP in the last 72 hours.  Assessment/Plan: S/P Procedure(s) (LRB): VIDEO ASSISTED THORACOSCOPY (VATS)/EMPYEMA, MINI THORACOTOMY (Left) DECORTICATION (Left) - CXR unchanged and no air leak demonstrated with coughing Will try tube on water seal today   LOS: 28 days    Melrose Nakayama 12/01/2018
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
## 2018-12-02 ENCOUNTER — Inpatient Hospital Stay (HOSPITAL_COMMUNITY): Payer: Medicaid Other

## 2018-12-02 IMAGING — DX PORTABLE CHEST - 1 VIEW
1 series · 1 of 1 positions shown · non-contrast
Comparison: [DATE]

CLINICAL DATA: Pneumothorax

EXAM:
PORTABLE CHEST 1 VIEW

[chest]
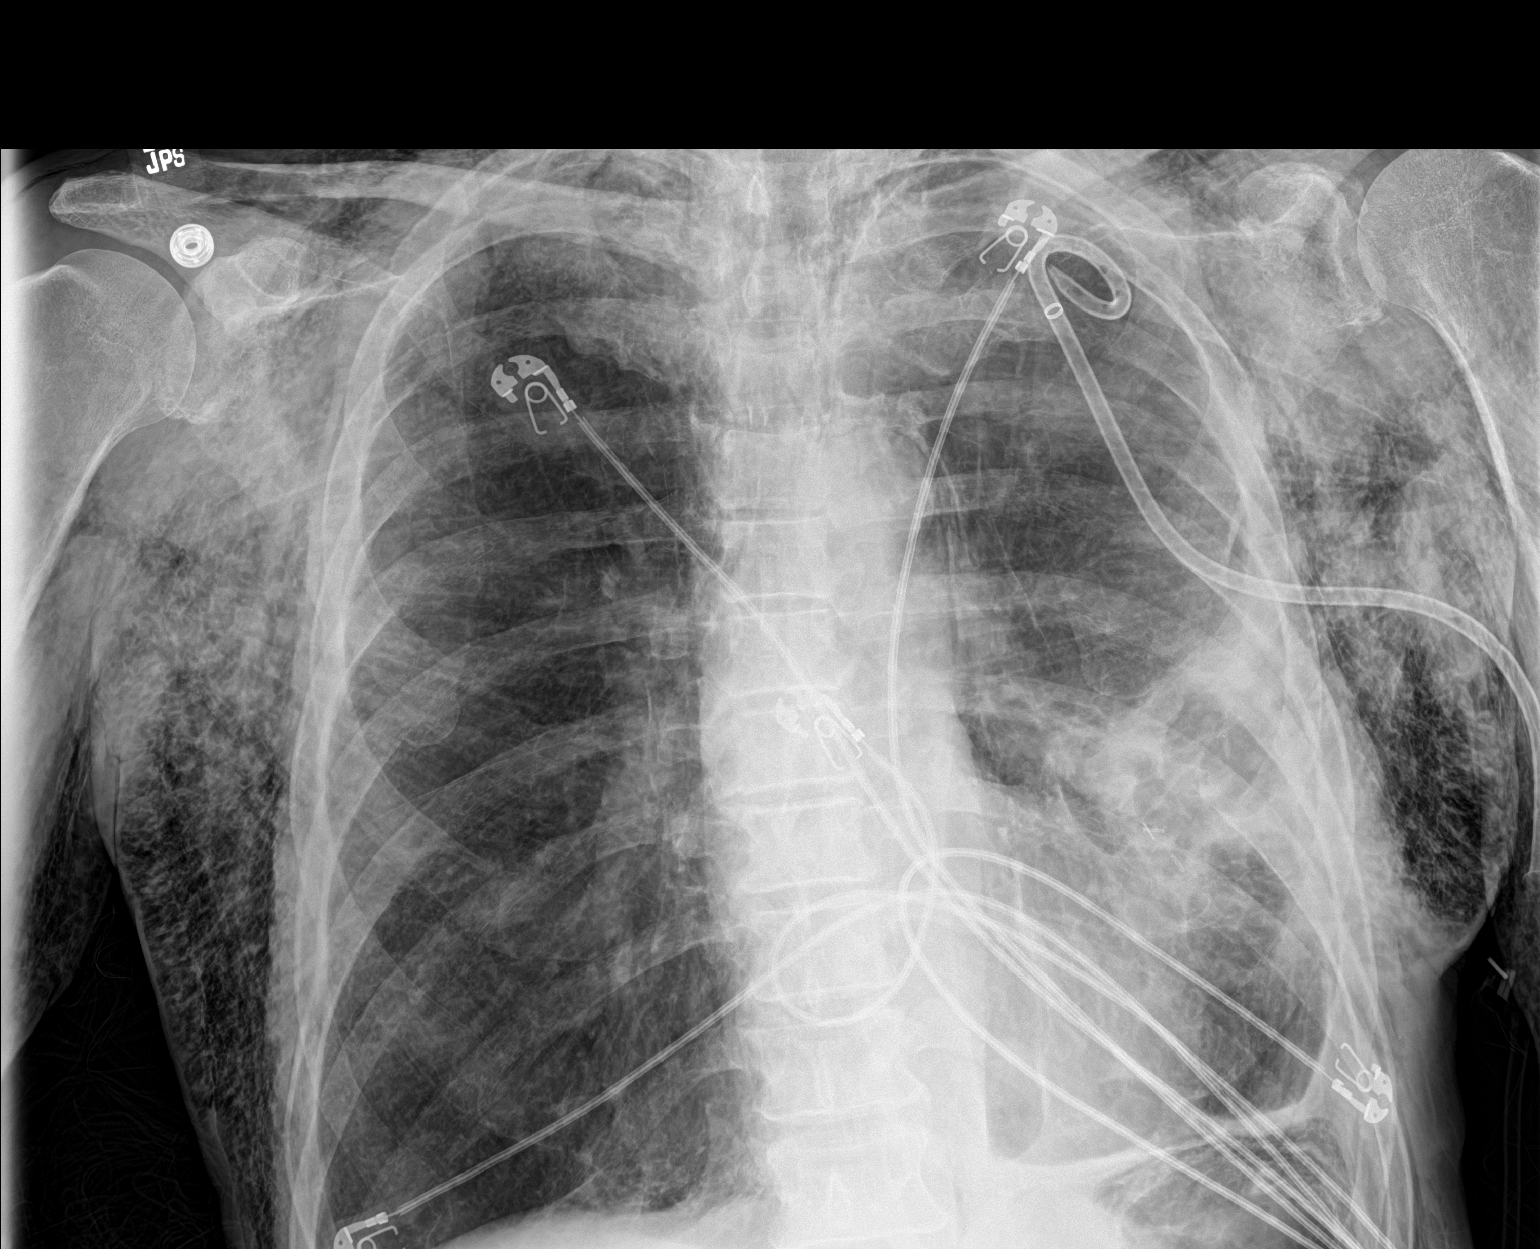

[1 of 1 positions shown; findings below may reference images not displayed]

FINDINGS: Stable left apical chest tube. Tiny biapical pneumothoraces are
stable. Pneumomediastinum is stable. Extensive soft tissue emphysema
across the chest is stable. Dense opacity with cavitation in the
left mid lung is stable. Hyperaeration.
IMPRESSION: Stable tiny bilateral apically pneumothoraces and pneumomediastinum.
Left chest tube is stable.

## 2018-12-02 NOTE — Progress Notes (Addendum)
      [id number]       [place] [address]             [phone]       20 Days Post-Op Procedure(s) (LRB): VIDEO ASSISTED THORACOSCOPY (VATS)/EMPYEMA, MINI THORACOTOMY (Left) DECORTICATION (Left)  Subjective: Patient states chest tube put to water seal yesterday but because had increased "swelling" put back on suction.  Objective: Vital signs in last 24 hours: Temp:  [97.6 F (36.4 C)-98.2 F (36.8 C)] 97.6 F (36.4 C) (04/27 0741) Pulse Rate:  [68-86] 68 (04/27 0400) Cardiac Rhythm: Normal sinus rhythm (04/26 1907) Resp:  [14-16] 14 (04/27 0400) BP: (102-122)/(65-77) 122/65 (04/27 0400) SpO2:  [95 %-100 %] 100 % (04/27 0400)     Intake/Output from previous day: 04/26 0701 - 04/27 0700 In: 1320 [P.O.:1320] Out: 665 [Urine:650; Chest Tube:15]   Physical Exam:  Cardiovascular: RRR Pulmonary: Clear to auscultation bilaterally. Subcutaneous emphysema bilateral chest, neck face (improved since last seen several days ago), and eyes  Wounds: Clean and dry.  No erythema or signs of infection. Chest tube: to suction, no air leak  Lab Results: CBC: No results for input(s): WBC, HGB, HCT, PLT in the last 72 hours. BMET:  No results for input(s): NA, K, CL, CO2, GLUCOSE, BUN, CREATININE, CALCIUM in the last 72 hours.  PT/INR: No results for input(s): LABPROT, INR in the last 72 hours. ABG:  INR: Will add last result for INR, ABG once components are confirmed Will add last 4 CBG results once components are confirmed  Assessment/Plan:  1. CV - SR in the 90's. On Amlodipine 5 mg daily 2.  Pulmonary - On room air this am. Chest tube is to suction and there is NO air leak. CXR this am appears stable (persistent extensive subcutaneous emphysema, stable small right and left pneumothorax) . Await evaluation by Dr. Prescott Gum. Encourage incentive spirometer 3. Anemia-Last H and H stable at 9.1 and 26.8. Continue ferrous sulfate and folic acid 4.  Hypothyroidism-on Levothyroxine 175 mcg daily  Donielle M ZimmermanPA-C 12/02/2018,7:48 AM [phone]  leave pigtail to suction until subQ air minimal- it was placed because of     severe   subQ air Patient examined and CXR image reviewed P Prescott Gum
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
## 2018-12-02 NOTE — Progress Notes (Signed)
Patient ID: [name], male   DOB: [birth year], 58 y.o.   MRN: [id number]   IR Round Note via phone Secondary new regulations   4/20 procedure Indications:Recurrent left pneumothorax Procedure:CT guided left chest tube placement Findings:14 Fr drain placed and ptx nearly resolved Complications:None PVX:YIAX than 10 ml Plan:Chest tube to wall suction at 20 cm   Pt looks some better today per RN  Chest tube intact  Site is clean and dry NT No air leak Suction now at -10  CXR today:  IMPRESSION: Stable tiny bilateral apically pneumothoraces and pneumomediastinum. Left chest tube is stable.   Followed by Dr Grandville Silos and Dr Prescott Gum
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
## 2018-12-03 ENCOUNTER — Inpatient Hospital Stay (HOSPITAL_COMMUNITY): Payer: Medicaid Other

## 2018-12-03 IMAGING — DX PORTABLE CHEST - 1 VIEW
1 series · 1 of 1 positions shown · non-contrast
Comparison: [DATE], [DATE] and CT [DATE] [DATE]

CLINICAL DATA: Evaluate pneumothorax.  Chest tube.

EXAM:
PORTABLE CHEST 1 VIEW

[chest ap]
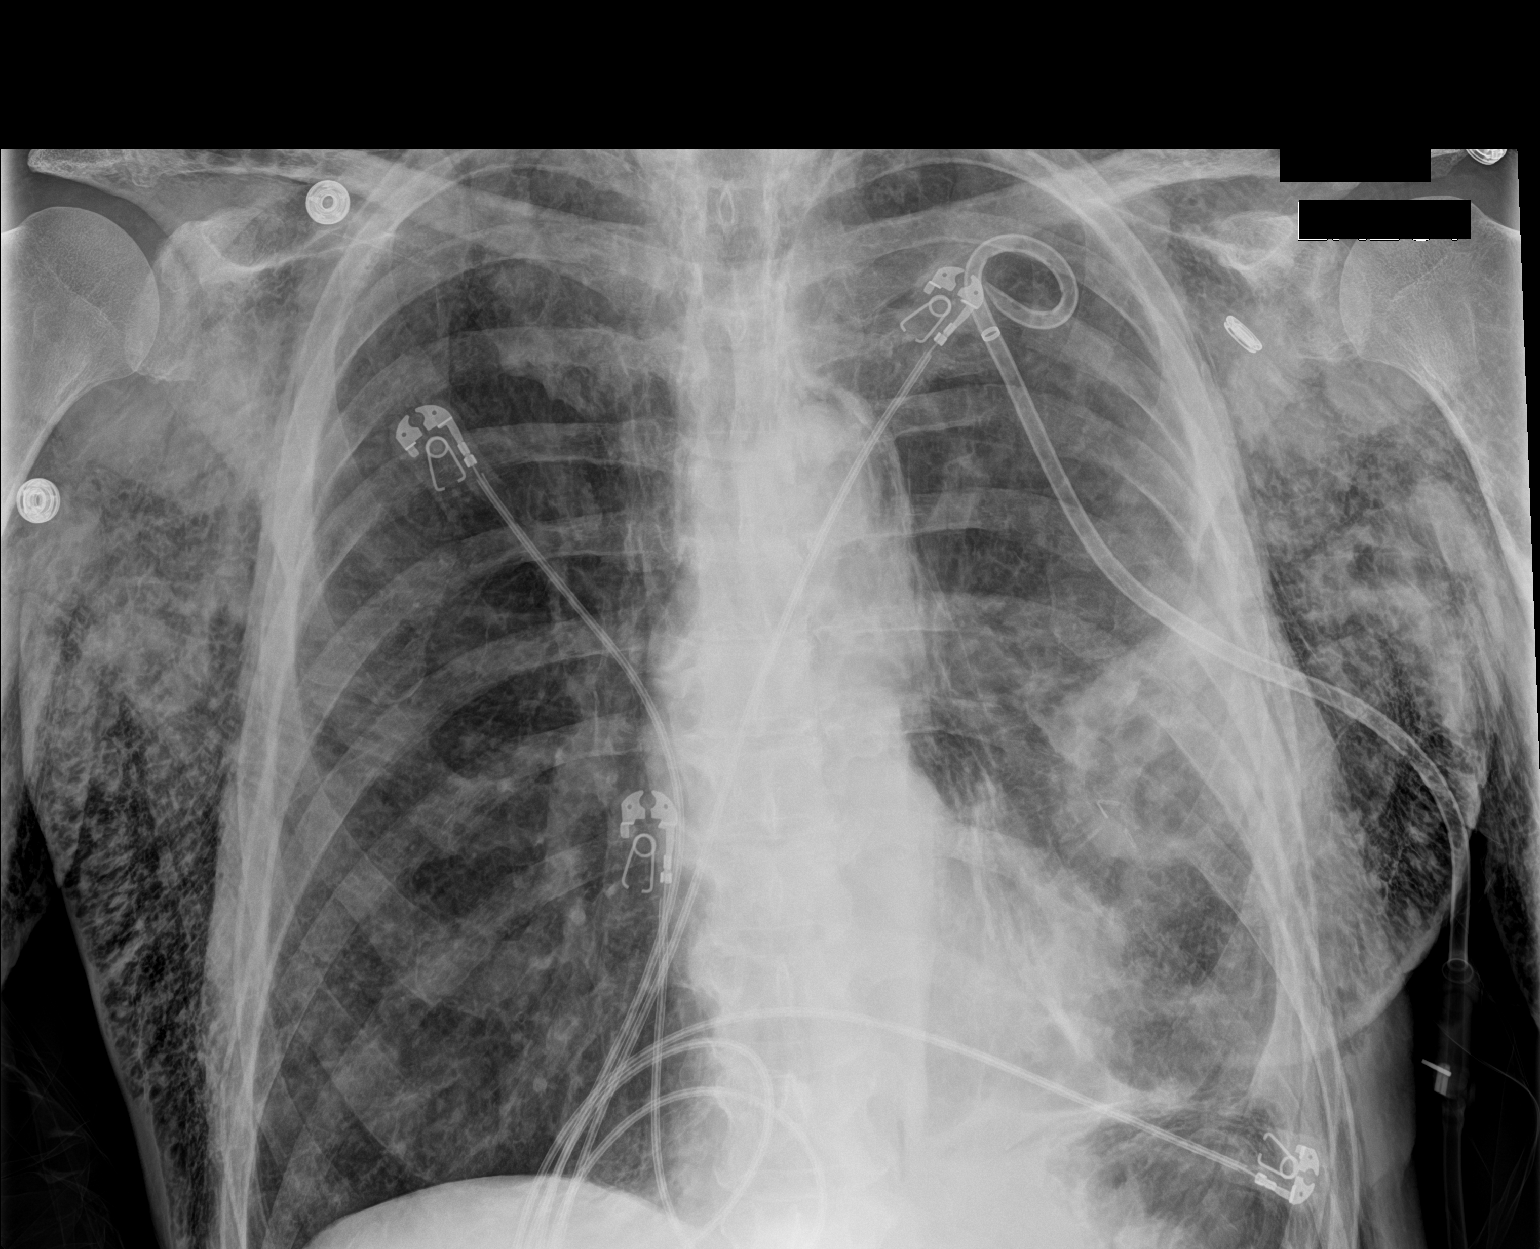

[1 of 1 positions shown; findings below may reference images not displayed]

FINDINGS: Left apical pigtail pleural drainage catheter unchanged. Possible
tiny persistent left apical pneumothorax. Possible tiny right apical
pneumothorax unchanged. Cavitary process over the left mid lung
unchanged. Left base atelectasis and possible small amount left
pleural fluid unchanged. Cardiomediastinal silhouette and remainder
of the exam is unchanged to include bilateral extensive subcutaneous
emphysema over the thorax.
IMPRESSION: Stable left base opacification likely atelectasis with small amount
of pleural fluid. Stable cavitary process over the left midlung.
Suggestion of tiny stable apically pneumothoraces. Left pigtail
pleural drainage catheter unchanged.

Extensive subcutaneous emphysema over the thorax unchanged.
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
## 2018-12-03 NOTE — Progress Notes (Signed)
Physical Therapy Discharge Patient Details Name: [name] MRN: [id number] DOB: Nov 18, 1960 Today's Date: 12/03/2018 Time: 7078-6754 PT Time Calculation (min) (ACUTE ONLY): 22 min  Patient discharged from PT services secondary to goals met and no further PT needs identified.  Please see latest therapy progress note for current level of functioning and progress toward goals.    Progress and discharge plan discussed with patient and/or caregiver: Patient agrees with plan  GP     Tyse Auriemma Spero Geralds, PTA Acute Rehabilitation Services Pager [phone] Office [phone]   12/03/2018, 1:23 PM

## 2018-12-03 NOTE — Progress Notes (Signed)
Patient ID: BELL CAI, male   DOB: Dec 27, 1960, 58 y.o.   MRN: [id number]   IR Round Note via phone Secondary new regulations   4/20 procedure Indications:Recurrent left pneumothorax Procedure:CT guided left chest tube placement Findings:14 Fr drain placed and ptx nearly resolved Complications:None KXF:GHWE than 10 ml Plan:Chest tube to wall suction at 20 cm   Pt looks some better today per RN  Chest tube intact  Site is clean and dry NT No air leak Suction now at -10  IMPRESSION: Stable left base opacification likely atelectasis with small amount of pleural fluid. Stable cavitary process over the left midlung. Suggestion of tiny stable apically pneumothoraces. Left pigtail pleural drainage catheter unchanged. Extensive subcutaneous emphysema over the thorax unchanged.  Followed by Dr Grandville Silos and Dr Prescott Gum
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
## 2018-12-03 NOTE — Progress Notes (Signed)
Doctor discontinued suction to the chest tube today. I had to page the doctor around 230 pm because patient said he is having same symptoms like before when his face became puffy. The doctor that answered my page at # [phone] said monitor his face and let him know by 5pm if there any significant changes.  He said he does not want to continue suction because that will delay the tube coming out.

## 2018-12-04 ENCOUNTER — Inpatient Hospital Stay (HOSPITAL_COMMUNITY): Payer: Medicaid Other

## 2018-12-04 LAB — CULTURE, FUNGUS WITHOUT SMEAR

## 2018-12-04 IMAGING — CR PORTABLE CHEST - 1 VIEW
1 series · 1 of 1 positions shown · non-contrast
Comparison: Radiograph [DATE].

CLINICAL DATA: Chest tube in place.

EXAM:
PORTABLE CHEST 1 VIEW

[AP]
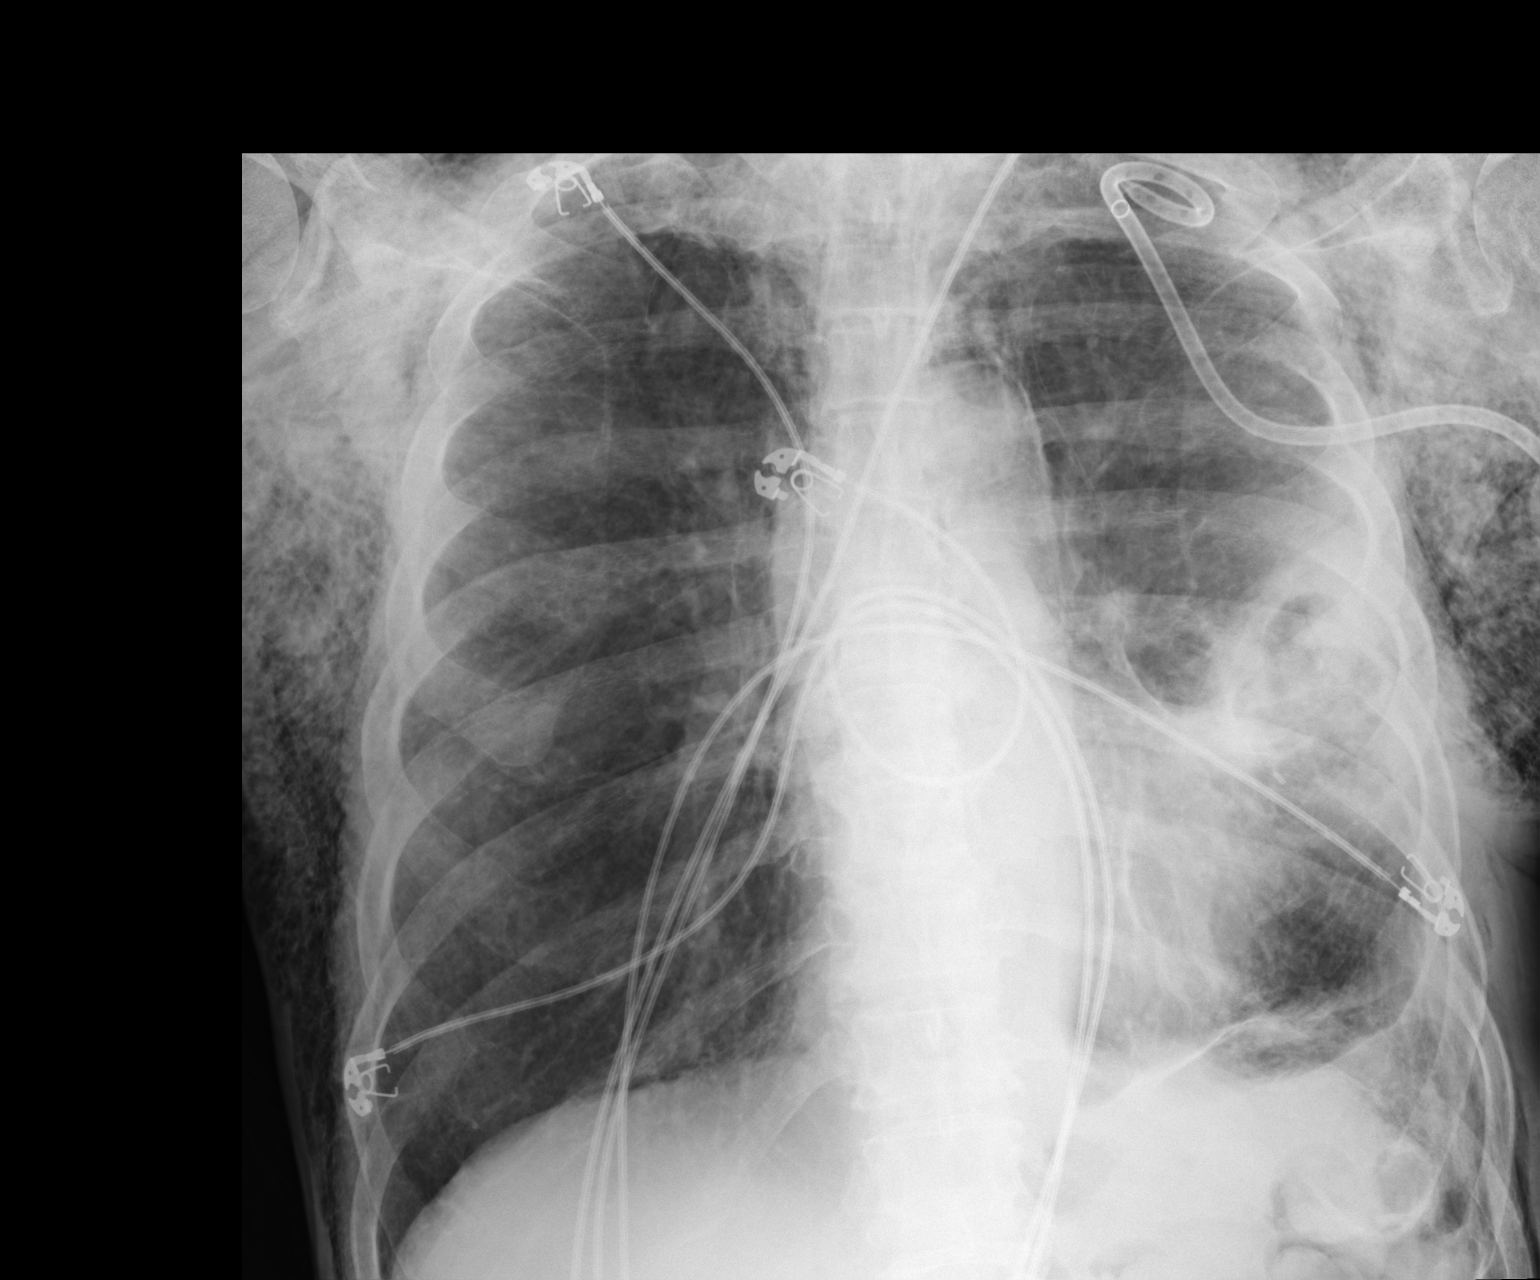

[1 of 1 positions shown; findings below may reference images not displayed]

FINDINGS: Stable cardiomediastinal silhouette. Left-sided chest tube is
unchanged in position. Stable extensive subcutaneous emphysema is
noted bilaterally. Stable cavitary lesion is seen in the left
midlung concerning for cavitary pneumonia or possibly neoplasm. No
definite pneumothorax is seen currently. Right lung appears clear.
Bony thorax is unremarkable.
IMPRESSION: Stable extensive subcutaneous emphysema bilaterally. Stable position
of left-sided chest tube. No definite pneumothorax is noted
currently. Stable cavitary abnormality seen in the left midlung
concerning for pneumonia or possibly neoplasm.

## 2018-12-04 NOTE — Progress Notes (Signed)
IR rounding note via telephone per new regulations. Spoke with Noreene Larsson, RN.  Patient with recurrent left pneumothorax s/p left chest tube placement 11/25/2018 by Dr. Anselm Pancoast.  CXR 12/04/2018: 1. Stable extensive subcutaneous emphysema bilaterally. Stable position of left-sided chest tube. No definite pneumothorax is noted currently. Stable cavitary abnormality seen in the left midlung concerning for pneumonia or possibly neoplasm.  RN reports patient's SQ emphysema has improved slightly since yesterday. States left chest tube site c/d/i. Chest tube to waterseal with (-) air leak.  TCTS and CCS also following- appreciate and agree with their management; per Dr. Prescott Gum plan for possible removal tomorrow if all parameters are stable. IR to follow.  Bea Graff Wendy Hoback, PA-C 12/04/2018, 11:52 AM
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
## 2018-12-05 ENCOUNTER — Inpatient Hospital Stay (HOSPITAL_COMMUNITY): Payer: Medicaid Other

## 2018-12-05 IMAGING — DX PORTABLE CHEST - 1 VIEW
1 series · 1 of 1 positions shown · non-contrast
Comparison: Radiograph [DATE].

CLINICAL DATA: Chest tube.

EXAM:
PORTABLE CHEST 1 VIEW

[chest]
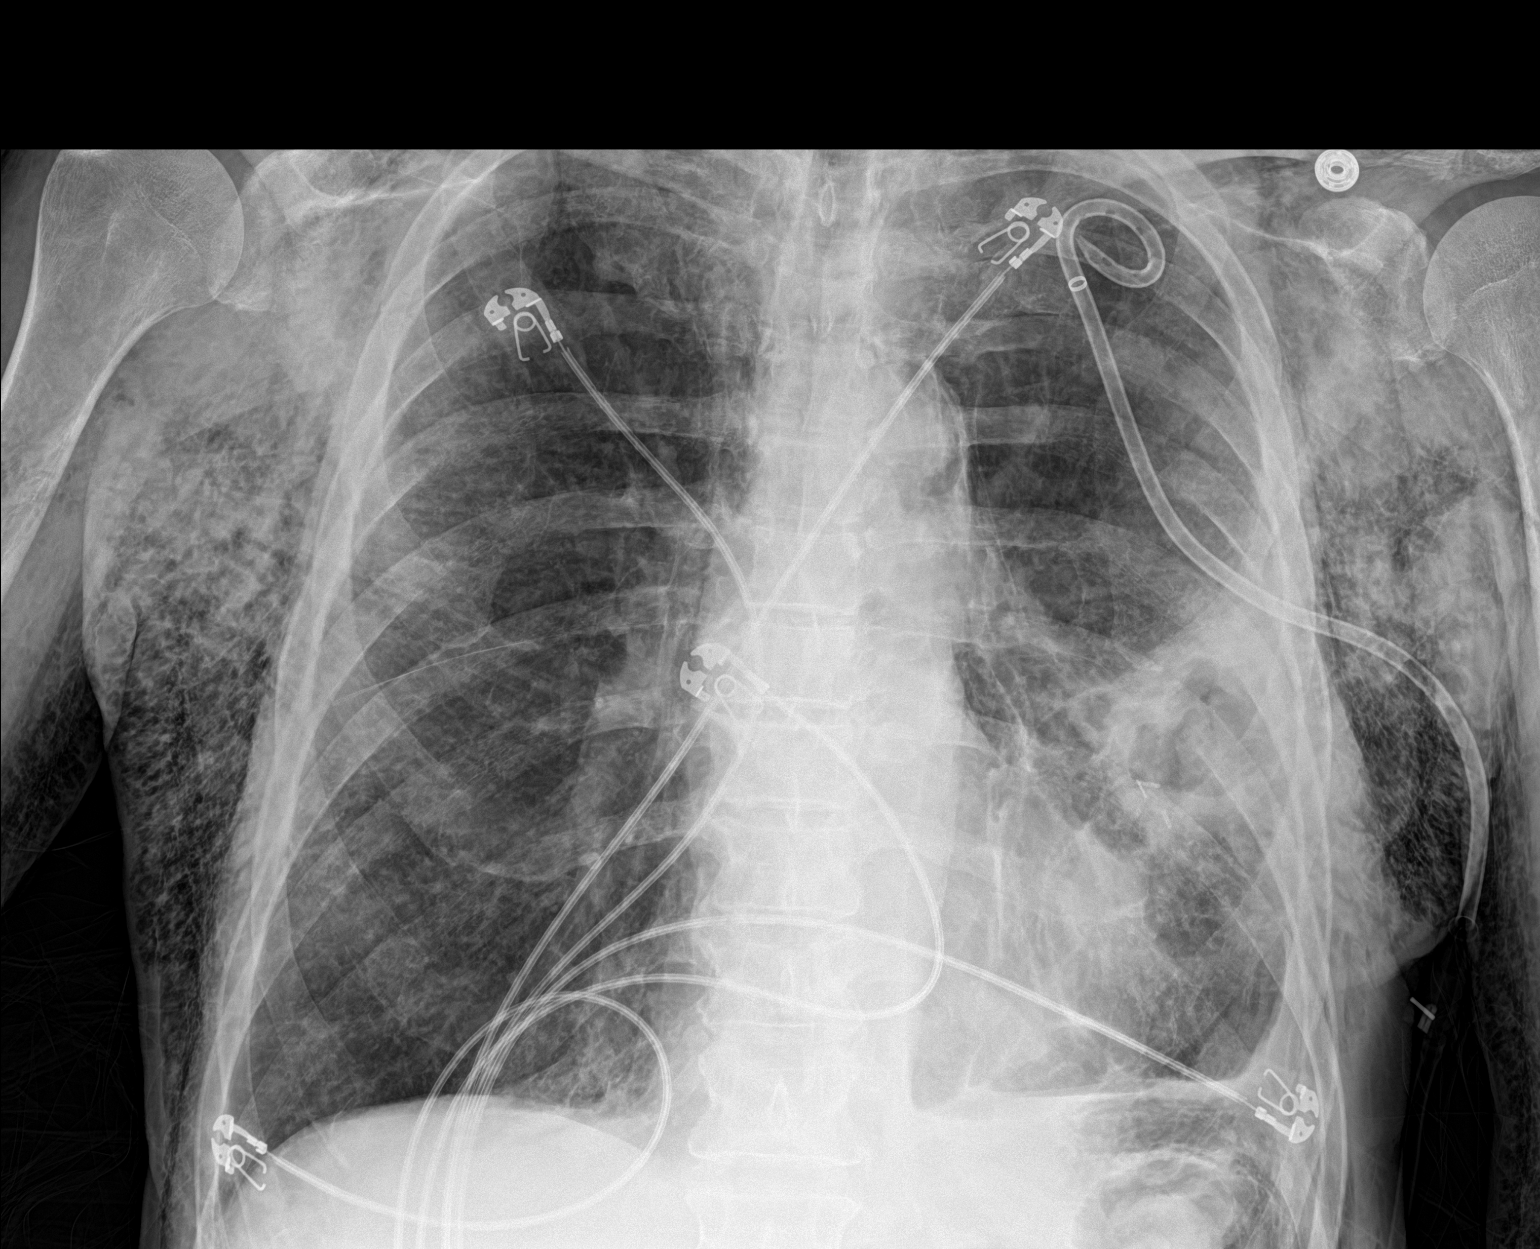

[1 of 1 positions shown; findings below may reference images not displayed]

FINDINGS: Stable pneumomediastinum is noted. Stable subcutaneous emphysema is
seen bilaterally. Stable position of left-sided chest tube. No
definite pneumothorax is noted. Stable cavitary lesion is seen in
left midlung. Stable small left pleural effusion and associated
atelectasis is noted. Bony thorax is unremarkable.
IMPRESSION: Stable extensive subcutaneous emphysema. Stable pneumomediastinum.
Stable position of left-sided chest tube without definite
pneumothorax. Stable cavitary lesion seen in left midlung.
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
## 2018-12-05 NOTE — Progress Notes (Signed)
Patient ID: [name], male   DOB: [birth year], 58 y.o.   MRN: [id number]   IR Round Note via phone Secondary new regulations   4/20 procedure Indications:Recurrent left pneumothorax Procedure:CT guided left chest tube placement Findings:14 Fr drain placed and ptx nearly resolved Complications:None HAF:BXUX than 10 ml Plan:Chest tube to wall suction at 20 cm   Pt looks better daily per RN  Chest tube intact  Site is clean and dry NT No air leak On water seal now over 24 hrs Dr Darcey Nora note approves removal Order in place RN states they will remove at bedside  CXR today:  IMPRESSION: Stable extensive subcutaneous emphysema. Stable pneumomediastinum. Stable position of left-sided chest tube without definite pneumothorax. Stable cavitary lesion seen in left midlung.  Followed by Dr Grandville Silos and Dr Prescott Gum

## 2018-12-05 NOTE — Progress Notes (Addendum)
      [address]       [place] [address]             [phone]      23 Days Post-Op Procedure(s) (LRB): VIDEO ASSISTED THORACOSCOPY (VATS)/EMPYEMA, MINI THORACOTOMY (Left) DECORTICATION (Left)   Subjective:  Patient feeling much better.  Hopeful his chest tube can be removed today.    Objective: Vital signs in last 24 hours: Temp:  [97.8 F (36.6 C)-98.8 F (37.1 C)] 98.2 F (36.8 C) (04/30 0800) Pulse Rate:  [73-90] 84 (04/30 0800) Cardiac Rhythm: Normal sinus rhythm (04/30 0700) Resp:  [16-24] 16 (04/30 0800) BP: (112-135)/(71-89) 122/82 (04/30 0800) SpO2:  [97 %-100 %] 99 % (04/30 0800)  Intake/Output from previous day: 04/29 0701 - 04/30 0700 In: 480 [P.O.:480] Out: 900 [Urine:900] Intake/Output this shift: Total I/O In: -  Out: 300 [Urine:300]  General appearance: alert, cooperative and no distress Heart: regular rate and rhythm Lungs: clear to auscultation bilaterally Abdomen: soft, non-tender; bowel sounds normal; no masses,  no organomegaly Extremities: sub q air much improved along head/neck.  There remains sub q air along right and left torso and bilateral UE,, but this is much improved Wound: clean and dry  Lab Results: No results for input(s): WBC, HGB, HCT, PLT in the last 72 hours. BMET: No results for input(s): NA, K, CL, CO2, GLUCOSE, BUN, CREATININE, CALCIUM in the last 72 hours.  PT/INR: No results for input(s): LABPROT, INR in the last 72 hours. ABG    Component Value Date/Time   PHART 7.416 11/13/2018 0420   HCO3 23.2 11/13/2018 0420   TCO2 20 (L) 11/03/2018 1807   ACIDBASEDEF 0.7 11/13/2018 0420   O2SAT 96.5 11/13/2018 0420   CBG (last 3)  No results for input(s): GLUCAP in the last 72 hours.  Assessment/Plan: S/P Procedure(s) (LRB): VIDEO ASSISTED THORACOSCOPY (VATS)/EMPYEMA, MINI THORACOTOMY (Left) DECORTICATION (Left)  1. Chest tube- no air leak, significant improvement of sub q air, CXR remains stable-  will d/c chest tube today 2. Dispo- patient stable, d/c chest tube today, repeat CXR in AM, care per trauma   LOS: 32 days    [name] 12/05/2018  CXR image reviewed and patient examined Will pull tube today I am CXR is ok he can go to [facility] Raymond Gum MD

## 2018-12-06 ENCOUNTER — Inpatient Hospital Stay (HOSPITAL_COMMUNITY): Payer: Medicaid Other

## 2018-12-06 DIAGNOSIS — F329 Major depressive disorder, single episode, unspecified: Secondary | ICD-10-CM | POA: Diagnosis present

## 2018-12-06 DIAGNOSIS — I2510 Atherosclerotic heart disease of native coronary artery without angina pectoris: Secondary | ICD-10-CM | POA: Diagnosis present

## 2018-12-06 DIAGNOSIS — S21332A Puncture wound without foreign body of left front wall of thorax with penetration into thoracic cavity, initial encounter: Secondary | ICD-10-CM | POA: Diagnosis present

## 2018-12-06 DIAGNOSIS — G4733 Obstructive sleep apnea (adult) (pediatric): Secondary | ICD-10-CM | POA: Diagnosis present

## 2018-12-06 DIAGNOSIS — G4700 Insomnia, unspecified: Secondary | ICD-10-CM | POA: Diagnosis present

## 2018-12-06 DIAGNOSIS — K658 Other peritonitis: Secondary | ICD-10-CM | POA: Diagnosis present

## 2018-12-06 DIAGNOSIS — Y92230 Patient room in hospital as the place of occurrence of the external cause: Secondary | ICD-10-CM | POA: Diagnosis not present

## 2018-12-06 DIAGNOSIS — S27331A Laceration of lung, unilateral, initial encounter: Secondary | ICD-10-CM | POA: Diagnosis present

## 2018-12-06 DIAGNOSIS — Z653 Problems related to other legal circumstances: Secondary | ICD-10-CM | POA: Diagnosis not present

## 2018-12-06 DIAGNOSIS — D62 Acute posthemorrhagic anemia: Secondary | ICD-10-CM | POA: Diagnosis present

## 2018-12-06 DIAGNOSIS — S272XXA Traumatic hemopneumothorax, initial encounter: Secondary | ICD-10-CM | POA: Diagnosis present

## 2018-12-06 DIAGNOSIS — F1010 Alcohol abuse, uncomplicated: Secondary | ICD-10-CM | POA: Diagnosis present

## 2018-12-06 DIAGNOSIS — J869 Pyothorax without fistula: Secondary | ICD-10-CM | POA: Diagnosis not present

## 2018-12-06 DIAGNOSIS — Z635 Disruption of family by separation and divorce: Secondary | ICD-10-CM | POA: Diagnosis not present

## 2018-12-06 DIAGNOSIS — E039 Hypothyroidism, unspecified: Secondary | ICD-10-CM | POA: Diagnosis present

## 2018-12-06 DIAGNOSIS — K567 Ileus, unspecified: Secondary | ICD-10-CM | POA: Diagnosis not present

## 2018-12-06 DIAGNOSIS — M25511 Pain in right shoulder: Secondary | ICD-10-CM | POA: Diagnosis not present

## 2018-12-06 DIAGNOSIS — I10 Essential (primary) hypertension: Secondary | ICD-10-CM | POA: Diagnosis present

## 2018-12-06 DIAGNOSIS — F419 Anxiety disorder, unspecified: Secondary | ICD-10-CM | POA: Diagnosis present

## 2018-12-06 DIAGNOSIS — Z598 Other problems related to housing and economic circumstances: Secondary | ICD-10-CM | POA: Diagnosis not present

## 2018-12-06 DIAGNOSIS — B9689 Other specified bacterial agents as the cause of diseases classified elsewhere: Secondary | ICD-10-CM | POA: Diagnosis not present

## 2018-12-06 DIAGNOSIS — Y838 Other surgical procedures as the cause of abnormal reaction of the patient, or of later complication, without mention of misadventure at the time of the procedure: Secondary | ICD-10-CM | POA: Diagnosis not present

## 2018-12-06 DIAGNOSIS — Z751 Person awaiting admission to adequate facility elsewhere: Secondary | ICD-10-CM | POA: Diagnosis not present

## 2018-12-06 DIAGNOSIS — Z79890 Hormone replacement therapy (postmenopausal): Secondary | ICD-10-CM | POA: Diagnosis not present

## 2018-12-06 DIAGNOSIS — X72XXXA Intentional self-harm by handgun discharge, initial encounter: Secondary | ICD-10-CM | POA: Diagnosis not present

## 2018-12-06 DIAGNOSIS — T8182XA Emphysema (subcutaneous) resulting from a procedure, initial encounter: Secondary | ICD-10-CM | POA: Diagnosis not present

## 2018-12-06 DIAGNOSIS — F17210 Nicotine dependence, cigarettes, uncomplicated: Secondary | ICD-10-CM | POA: Diagnosis present

## 2018-12-06 IMAGING — CR CHEST - 2 VIEW
2 series · 2 of 2 positions shown · non-contrast
Comparison: Chest radiograph dated [DATE]. CT chest dated
[DATE].

CLINICAL DATA: Chest tube removal, soreness

EXAM:
CHEST - 2 VIEW

[chest pa]
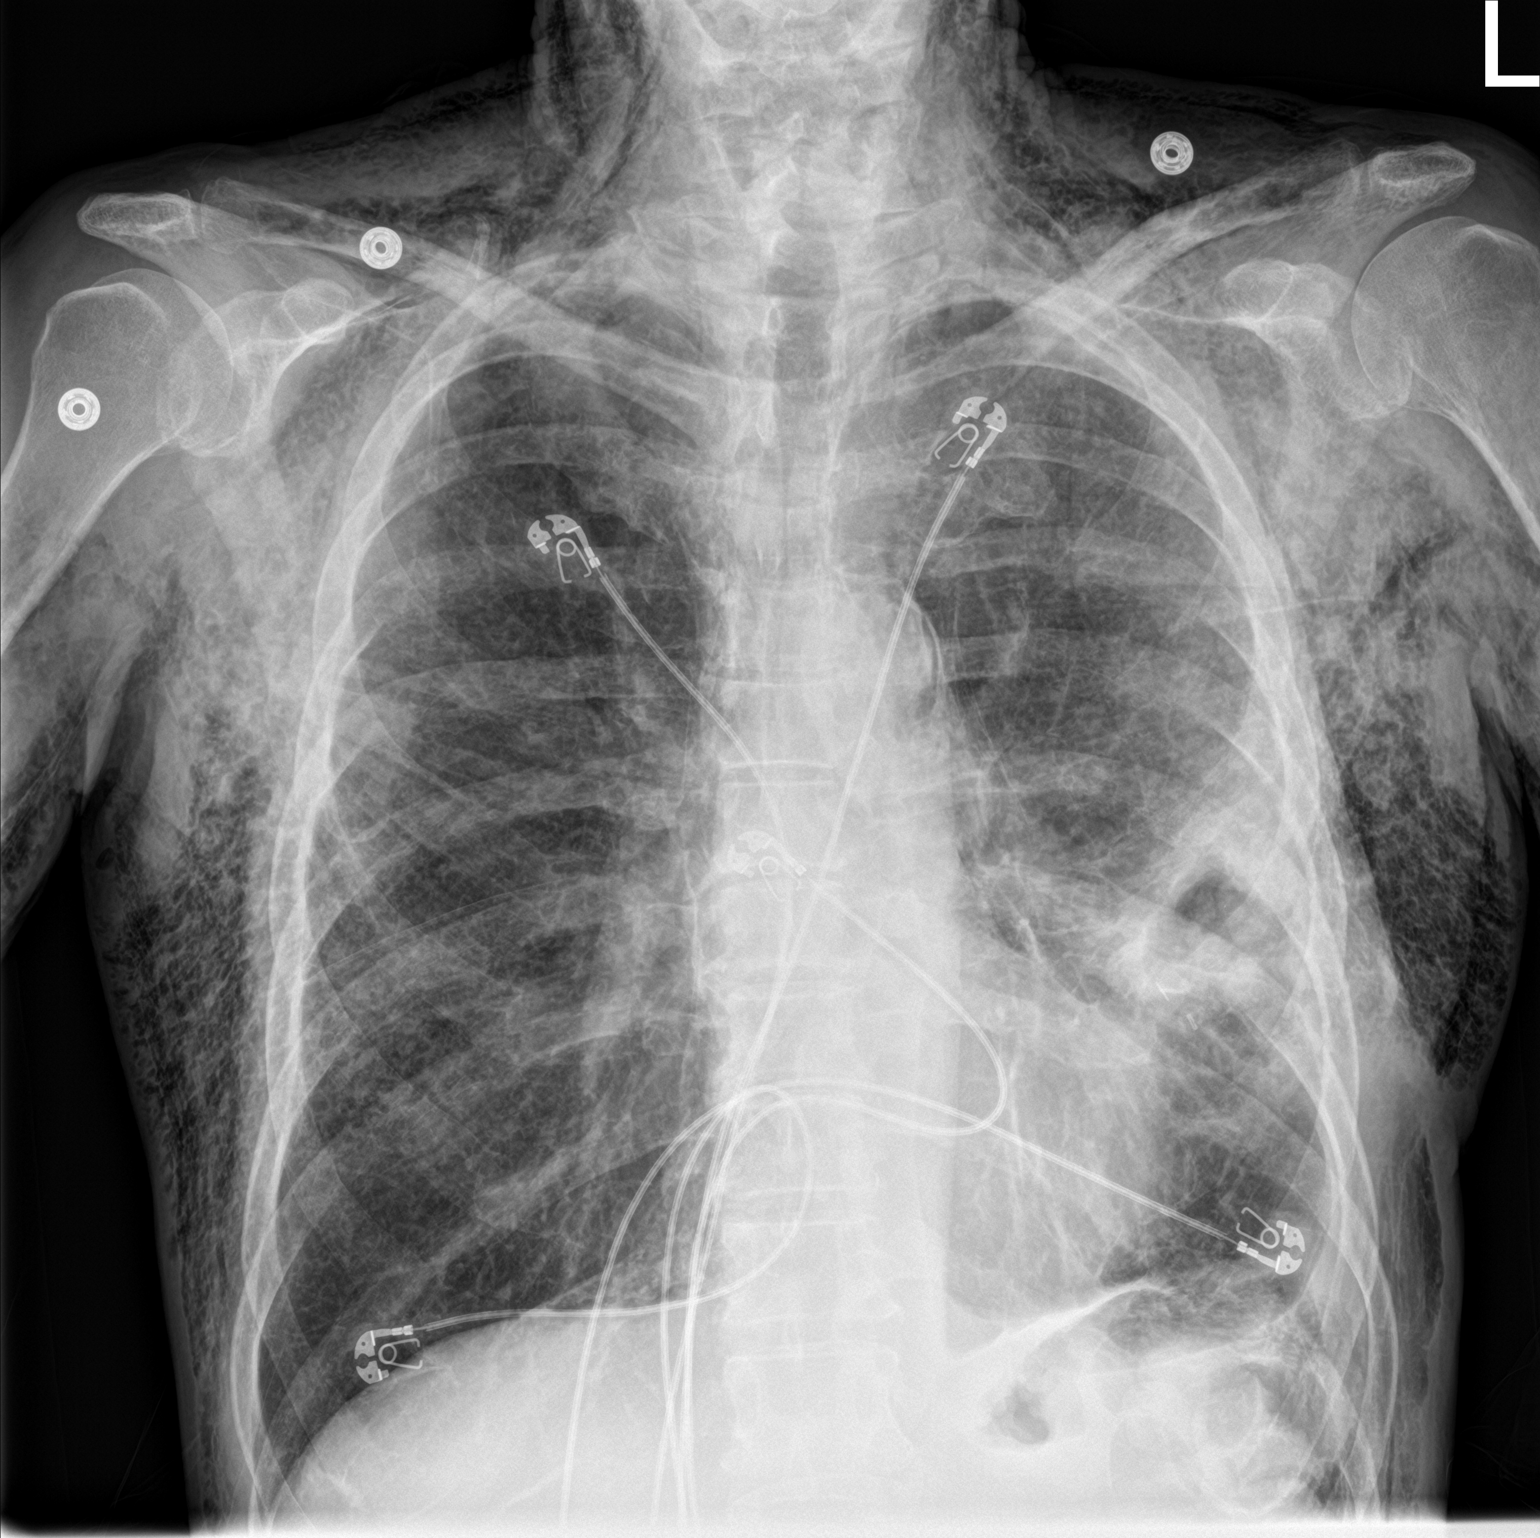

[chest lat]
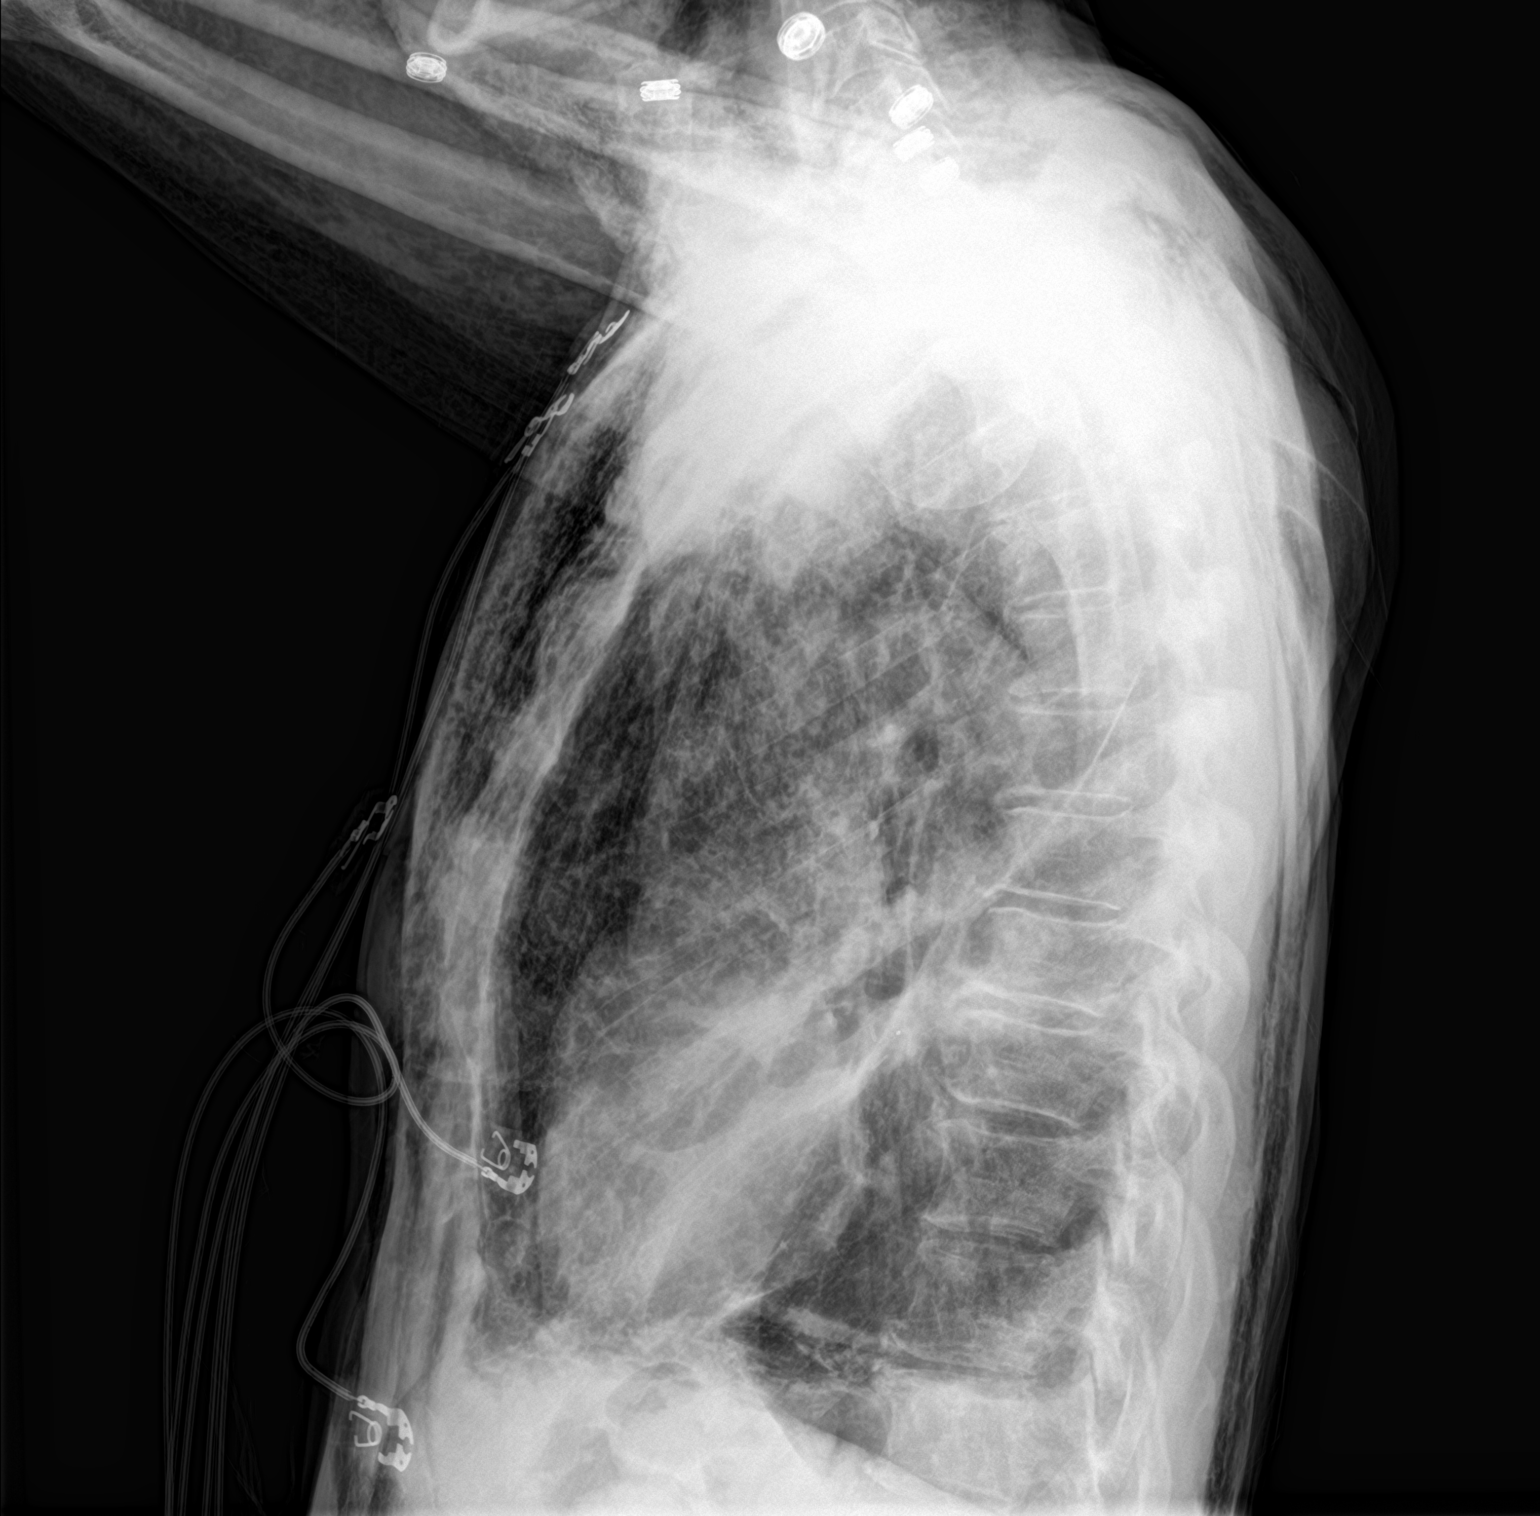

[2 of 2 positions shown; findings below may reference images not displayed]

FINDINGS: Interval removal of left apical chest tube. No pneumothorax is seen.

Extensive subcutaneous emphysema throughout the bilateral chest
walls and supraclavicular regions.

Stable cavitary lesion in the left mid lung, likely reflecting
pulmonary contusion/laceration. Mild blunting of the left
costophrenic angle with small left pleural effusion.

The heart is normal in size. Mild persistent pneumomediastinum,
significantly improved from the prior CT.
IMPRESSION: Interval removal of left apical chest tube. No pneumothorax is seen.

Extensive subcutaneous emphysema.  Mild new mediastinum, improved.

Stable left mid lung pulmonary contusion/laceration.

## 2018-12-06 MED ORDER — ACETAMINOPHEN 325 MG PO TABS: 650.0000 mg | ORAL_TABLET | Freq: Four times a day (QID) | ORAL | Status: DC | PRN

## 2018-12-06 MED ORDER — FOLIC ACID 1 MG PO TABS: 1.0000 mg | ORAL_TABLET | Freq: Every day | ORAL | Status: DC

## 2018-12-06 MED ORDER — THIAMINE HCL 100 MG PO TABS: 100.0000 mg | ORAL_TABLET | Freq: Every day | ORAL | Status: DC

## 2018-12-06 MED ORDER — METHOCARBAMOL 500 MG PO TABS: 500.0000 mg | ORAL_TABLET | Freq: Four times a day (QID) | ORAL | Status: DC | PRN

## 2018-12-06 MED ORDER — DOCUSATE SODIUM 100 MG PO CAPS: 100.0000 mg | ORAL_CAPSULE | Freq: Two times a day (BID) | ORAL | 0 refills | Status: DC

## 2018-12-06 MED ORDER — GUAIFENESIN 100 MG/5ML PO SOLN: 15.0000 mL | ORAL | 0 refills | Status: DC | PRN

## 2018-12-06 MED ORDER — HYDROCOD POLST-CPM POLST ER 10-8 MG/5ML PO SUER: 5.0000 mL | Freq: Three times a day (TID) | ORAL | Status: DC

## 2018-12-06 MED ORDER — POLYETHYLENE GLYCOL 3350 17 G PO PACK: 17.0000 g | PACK | Freq: Every day | ORAL | 0 refills | Status: DC

## 2018-12-06 MED ORDER — TAB-A-VITE/IRON PO TABS: 1.0000 | ORAL_TABLET | Freq: Every day | ORAL | 0 refills | Status: DC

## 2018-12-06 MED ORDER — FERROUS SULFATE 325 (65 FE) MG PO TABS: 325.0000 mg | ORAL_TABLET | Freq: Two times a day (BID) | ORAL | 3 refills | Status: DC

## 2018-12-06 MED ORDER — SENNOSIDES-DOCUSATE SODIUM 8.6-50 MG PO TABS: 1.0000 | ORAL_TABLET | Freq: Every day | ORAL | Status: DC

## 2018-12-06 NOTE — Plan of Care (Signed)
  Problem: Safety: Goal: Ability to remain free from injury will improve Outcome: Progressing   Problem: Education: Goal: Knowledge of warning signs, risks, and behaviors that relate to suicide ideation and self-harm behaviors will improve Outcome: Progressing   Problem: Health Behavior/Discharge (Transition) Planning: Goal: Ability to manage health-related needs will improve Outcome: Progressing   Problem: Clinical Measurements: Goal: Remain free from any harm during hospitalization Outcome: Progressing   Problem: Nutrition: Goal: Adequate fluids and nutrition will be maintained Outcome: Progressing   Problem: Coping: Goal: Ability to disclose and discuss thoughts of suicide and self-harm will improve Outcome: Progressing   Problem: Medication Management: Goal: Adhere to prescribed medication regimen Outcome: Progressing   Problem: Sleep Hygiene: Goal: Ability to obtain adequate restful sleep will improve Outcome: Progressing   Problem: Self Esteem: Goal: Ability to verbalize positive feeling about self will improve Outcome: Progressing
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
## 2018-12-09 ENCOUNTER — Encounter (HOSPITAL_COMMUNITY): Payer: Self-pay

## 2018-12-09 ENCOUNTER — Other Ambulatory Visit: Payer: Self-pay | Admitting: Behavioral Health

## 2018-12-09 ENCOUNTER — Other Ambulatory Visit: Payer: Self-pay

## 2018-12-09 ENCOUNTER — Inpatient Hospital Stay (HOSPITAL_COMMUNITY)
Admission: AD | Admit: 2018-12-09 | Discharge: 2018-12-19 | DRG: 881 | Disposition: A | Discharge status: Home / Self Care | Payer: Medicaid Other | Source: Intra-hospital | Attending: Psychiatry | Admitting: Psychiatry

## 2018-12-09 DIAGNOSIS — Z87891 Personal history of nicotine dependence: Secondary | ICD-10-CM

## 2018-12-09 DIAGNOSIS — E039 Hypothyroidism, unspecified: Secondary | ICD-10-CM | POA: Diagnosis present

## 2018-12-09 DIAGNOSIS — F338 Other recurrent depressive disorders: Secondary | ICD-10-CM | POA: Diagnosis not present

## 2018-12-09 DIAGNOSIS — I10 Essential (primary) hypertension: Secondary | ICD-10-CM | POA: Diagnosis present

## 2018-12-09 DIAGNOSIS — F329 Major depressive disorder, single episode, unspecified: Principal | ICD-10-CM | POA: Diagnosis present

## 2018-12-09 DIAGNOSIS — T1491XA Suicide attempt, initial encounter: Secondary | ICD-10-CM | POA: Diagnosis not present

## 2018-12-09 DIAGNOSIS — Z87828 Personal history of other (healed) physical injury and trauma: Secondary | ICD-10-CM

## 2018-12-09 DIAGNOSIS — F419 Anxiety disorder, unspecified: Secondary | ICD-10-CM | POA: Diagnosis present

## 2018-12-09 DIAGNOSIS — G4700 Insomnia, unspecified: Secondary | ICD-10-CM | POA: Diagnosis present

## 2018-12-09 DIAGNOSIS — J869 Pyothorax without fistula: Secondary | ICD-10-CM | POA: Diagnosis present

## 2018-12-09 DIAGNOSIS — F1020 Alcohol dependence, uncomplicated: Secondary | ICD-10-CM | POA: Diagnosis present

## 2018-12-09 DIAGNOSIS — K5900 Constipation, unspecified: Secondary | ICD-10-CM | POA: Diagnosis present

## 2018-12-09 DIAGNOSIS — X749XXA Intentional self-harm by unspecified firearm discharge, initial encounter: Secondary | ICD-10-CM | POA: Diagnosis not present

## 2018-12-09 DIAGNOSIS — F319 Bipolar disorder, unspecified: Secondary | ICD-10-CM | POA: Diagnosis present

## 2018-12-09 MED ORDER — MAGNESIUM HYDROXIDE 400 MG/5ML PO SUSP: 30.0000 mL | Freq: Every day | ORAL | Status: DC | PRN

## 2018-12-09 MED ORDER — ALUM & MAG HYDROXIDE-SIMETH 200-200-20 MG/5ML PO SUSP: 30.0000 mL | ORAL | Status: DC | PRN
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
## 2018-12-09 NOTE — Progress Notes (Signed)
D   Pt remained in his room this evening and did not want to talk to staff   He reports anger issues and difficulty getting along with others A   Verbal support offered   Medications offered   Q 15 min checks R  Pt remains safe at this time  [place] CORONAVIRUS (COVID-19) DAILY CHECK-OFF SYMPTOMS - answer yes or no to each - every day NO YES  Have you had a fever in the past 24 hours?  . Fever (Temp > 37.80C / 100F) X   Have you had any of these symptoms in the past 24 hours? . New Cough .  Sore Throat  .  Shortness of Breath .  Difficulty Breathing .  Unexplained Body Aches   X   Have you had any one of these symptoms in the past 24 hours not related to allergies?   . Runny Nose .  Nasal Congestion .  Sneezing   X   If you have had runny nose, nasal congestion, sneezing in the past 24 hours, has it worsened?  X   EXPOSURES - check yes or no X   Have you traveled outside the state in the past 14 days?  X   Have you been in contact with someone with a confirmed diagnosis of COVID-19 or PUI in the past 14 days without wearing appropriate PPE?  X   Have you been living in the same home as a person with confirmed diagnosis of COVID-19 or a PUI (household contact)?    X   Have you been diagnosed with COVID-19?    X              What to do next: Answered NO to all: Answered YES to anything:   Proceed with unit schedule Follow the [facility] Inpatient Flowsheet.

## 2018-12-09 NOTE — Progress Notes (Signed)
IV removed, discharge instructions reviewed with patient.  Report called to [place] at [employer].  Transport scheduled at 4:30.

## 2018-12-09 NOTE — TOC Transition Note (Signed)
Transition of Care [facility]) - CM/SW Discharge Note   Patient Details  Name: [name] MRN: [id number] Date of Birth: 27-Mar-1961  [facility] CM/SW Contact:  Malon Kindle, LCSW Phone Number: [phone] 12/09/2018, 2:58 PM   Clinical Narrative:     Clinical Social Worker continuing to follow patient for support and discharge planning needs.  Patient is agreeable to go to [facility] at discharge.  [facility] now has an available bed with details to follow -   Accpeting MD - Dr. Mariea Clonts Attending MD - Dr. Parke Poisson Room 402-1 Number for Report - [phone]  [facility] transport arranged for 16:30 from [facility] to [place] form completed and sent with patient.  Patient to update wife by phone prior to transition.  Clinical Social Worker will sign off for now as social work intervention is no longer needed. Please consult [place] again if new need arises.  Final next level of care: Psychiatric Hospital Barriers to Discharge: No Barriers Identified   Patient Goals and CMS Choice Patient states their goals for this hospitalization and ongoing recovery are:: Patient would like to return home with family      Discharge Placement             [facility]   Patient to be transferred to facility by: [facility] Name of family member notified: Patient to notify wife Patient and family notified of of transfer: 12/09/18  Discharge Plan and Services                                     Social Determinants of Health (SDOH) Interventions     Readmission Risk Interventions Readmission Risk Prevention Plan 12/09/2018  Transportation Screening Complete  PCP or Specialist Appt within 5-7 Days Not Complete  Not Complete comments MD has arranged appt for 5/13 in his office.    Home Care Screening Not Complete  Home Care Screening Not Completed Comments Patient discharging to [facility]   Medication Review (RN CM) Complete

## 2018-12-09 NOTE — Tx Team (Signed)
Initial Treatment Plan 12/09/2018 6:16 PM CLIFTON SAFLEY HRV:[id number]    PATIENT STRESSORS: Financial difficulties Health problems Marital or family conflict   PATIENT STRENGTHS: Average or above average intelligence Capable of independent living Communication skills   PATIENT IDENTIFIED PROBLEMS: Depression  Suicidal Ideation                   DISCHARGE CRITERIA:  Ability to meet basic life and health needs Adequate post-discharge living arrangements  PRELIMINARY DISCHARGE PLAN: Outpatient therapy Return to previous living arrangement  PATIENT/FAMILY INVOLVEMENT: This treatment plan has been presented to and reviewed with the patient, [name], and/or family member.  The patient and family have been given the opportunity to ask questions and make suggestions.  Vela Prose, RN 12/09/2018, 6:16 PM

## 2018-12-10 DIAGNOSIS — X749XXA Intentional self-harm by unspecified firearm discharge, initial encounter: Secondary | ICD-10-CM

## 2018-12-10 DIAGNOSIS — T1491XA Suicide attempt, initial encounter: Secondary | ICD-10-CM

## 2018-12-10 DIAGNOSIS — F338 Other recurrent depressive disorders: Secondary | ICD-10-CM

## 2018-12-10 LAB — LIPID PANEL
Cholesterol: 159 mg/dL (ref 0–200)
HDL: 30 mg/dL — ABNORMAL LOW (ref >40)
LDL Cholesterol: 99 mg/dL (ref 0–99)
Total CHOL/HDL Ratio: 5.3 RATIO
Triglycerides: 150 mg/dL — ABNORMAL HIGH (ref <150)
VLDL: 30 mg/dL (ref 0–40)

## 2018-12-10 LAB — TSH: TSH: 11.059 u[IU]/mL — ABNORMAL HIGH (ref 0.350–4.500)

## 2018-12-10 LAB — HEMOGLOBIN A1C
Hgb A1c MFr Bld: 4.9 % (ref 4.8–5.6)
Mean Plasma Glucose: 93.93 mg/dL

## 2018-12-10 MED ORDER — AMLODIPINE BESYLATE 5 MG PO TABS
5.0000 mg | ORAL_TABLET | Freq: Every day | ORAL | Status: DC
  Administered 2018-12-10 – 2018-12-19 (×10): 5 mg via ORAL
  Filled 2018-12-10 (×13): qty 1

## 2018-12-10 MED ORDER — LEVOTHYROXINE SODIUM 175 MCG PO TABS
175.0000 ug | ORAL_TABLET | Freq: Every day | ORAL | Status: DC
  Administered 2018-12-10 – 2018-12-19 (×10): 175 ug via ORAL
  Filled 2018-12-10 (×12): qty 1

## 2018-12-10 MED ORDER — ERYTHROMYCIN 5 MG/GM OP OINT
TOPICAL_OINTMENT | Freq: Every day | OPHTHALMIC | Status: DC
  Filled 2018-12-10: qty 3.5

## 2018-12-10 MED ORDER — DOCUSATE SODIUM 100 MG PO CAPS: 100.0000 mg | ORAL_CAPSULE | Freq: Every day | ORAL | Status: DC | PRN

## 2018-12-10 MED ORDER — SERTRALINE HCL 100 MG PO TABS
100.0000 mg | ORAL_TABLET | Freq: Every day | ORAL | Status: DC
  Administered 2018-12-10 – 2018-12-11 (×2): 100 mg via ORAL
  Filled 2018-12-10 (×4): qty 1

## 2018-12-10 MED ORDER — VITAMIN B-1 100 MG PO TABS
100.0000 mg | ORAL_TABLET | Freq: Every day | ORAL | Status: DC
  Administered 2018-12-10 – 2018-12-19 (×10): 100 mg via ORAL
  Filled 2018-12-10 (×13): qty 1

## 2018-12-10 MED ORDER — TRAZODONE HCL 50 MG PO TABS
50.0000 mg | ORAL_TABLET | Freq: Every evening | ORAL | Status: DC | PRN
  Filled 2018-12-10: qty 1

## 2018-12-10 MED ORDER — HYDROXYZINE HCL 25 MG PO TABS
25.0000 mg | ORAL_TABLET | Freq: Four times a day (QID) | ORAL | Status: DC | PRN
  Administered 2018-12-15 – 2018-12-18 (×3): 25 mg via ORAL
  Filled 2018-12-10 (×4): qty 1

## 2018-12-10 MED ORDER — TRAMADOL HCL 50 MG PO TABS
50.0000 mg | ORAL_TABLET | Freq: Four times a day (QID) | ORAL | Status: DC | PRN
  Administered 2018-12-10: 50 mg via ORAL
  Filled 2018-12-10: qty 1

## 2018-12-10 MED ORDER — TRAZODONE HCL 50 MG PO TABS
50.0000 mg | ORAL_TABLET | Freq: Every evening | ORAL | Status: DC | PRN
  Administered 2018-12-11: 50 mg via ORAL
  Filled 2018-12-10: qty 1

## 2018-12-10 MED ORDER — GABAPENTIN 100 MG PO CAPS
200.0000 mg | ORAL_CAPSULE | Freq: Three times a day (TID) | ORAL | Status: DC
  Administered 2018-12-10 – 2018-12-11 (×3): 200 mg via ORAL
  Filled 2018-12-10 (×6): qty 2

## 2018-12-10 MED ORDER — FOLIC ACID 1 MG PO TABS
1.0000 mg | ORAL_TABLET | Freq: Every day | ORAL | Status: DC
  Administered 2018-12-10 – 2018-12-19 (×10): 1 mg via ORAL
  Filled 2018-12-10 (×13): qty 1

## 2018-12-10 NOTE — Progress Notes (Signed)
D   Pt remained in his room this evening and did not want to talk to staff  He does not engage in conversation of interactions of any kind   He reports anger issues and difficulty getting along with others A   Verbal support offered   Medications offered   Q 15 min checks R  Pt remains safe at this time  [place] CORONAVIRUS (COVID-19) DAILY CHECK-OFF SYMPTOMS - answer yes or no to each - every day NO YES  Have you had a fever in the past 24 hours?  . Fever (Temp > 37.80C / 100F) X   Have you had any of these symptoms in the past 24 hours? . New Cough .  Sore Throat  .  Shortness of Breath .  Difficulty Breathing .  Unexplained Body Aches   X   Have you had any one of these symptoms in the past 24 hours not related to allergies?   . Runny Nose .  Nasal Congestion .  Sneezing   X   If you have had runny nose, nasal congestion, sneezing in the past 24 hours, has it worsened?  X   EXPOSURES - check yes or no X   Have you traveled outside the state in the past 14 days?  X   Have you been in contact with someone with a confirmed diagnosis of COVID-19 or PUI in the past 14 days without wearing appropriate PPE?  X   Have you been living in the same home as a person with confirmed diagnosis of COVID-19 or a PUI (household contact)?    X   Have you been diagnosed with COVID-19?    X              What to do next: Answered NO to all: Answered YES to anything:   Proceed with unit schedule Follow the [facility] Inpatient Flowsheet.
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
## 2018-12-10 NOTE — Progress Notes (Signed)
Dar Note: Patient presents with irritable affect and mood.  Denies suicidal thoughts, auditory and visual hallucinations.  Patient is withdrawn and isolates to his room.  Complain of chest pain this morning. States pain is non radiating but feels like his chest is feeling up with air.  MD made aware and vital signs with normal limit.  Tramadol 50 mg given with good effect.  Food and fluid intake encouraged due to poor appetite.  Medication given as prescribed.  Offered support and encouragement as needed.  Routine safety checks maintained every 15 minutes.  Patient is safe on the unit.
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
## 2018-12-11 ENCOUNTER — Ambulatory Visit: Payer: Self-pay | Admitting: Cardiothoracic Surgery

## 2018-12-11 ENCOUNTER — Ambulatory Visit (HOSPITAL_COMMUNITY)
Admit: 2018-12-11 | Discharge: 2018-12-11 | DRG: 179 | Disposition: A | Discharge status: Home / Self Care | Payer: Medicaid Other | Source: Ambulatory Visit | Attending: Psychiatry | Admitting: Psychiatry

## 2018-12-11 DIAGNOSIS — J869 Pyothorax without fistula: Secondary | ICD-10-CM | POA: Diagnosis not present

## 2018-12-11 DIAGNOSIS — F1020 Alcohol dependence, uncomplicated: Secondary | ICD-10-CM

## 2018-12-11 IMAGING — DX CHEST - 2 VIEW
2 series · 2 of 2 positions shown · non-contrast
Comparison: PA and lateral chest [DATE]. Single-view of the
chest [DATE].

CLINICAL DATA: History of left empyema. Chest tube removed
[DATE].

EXAM:
CHEST - 2 VIEW

[chest pa]
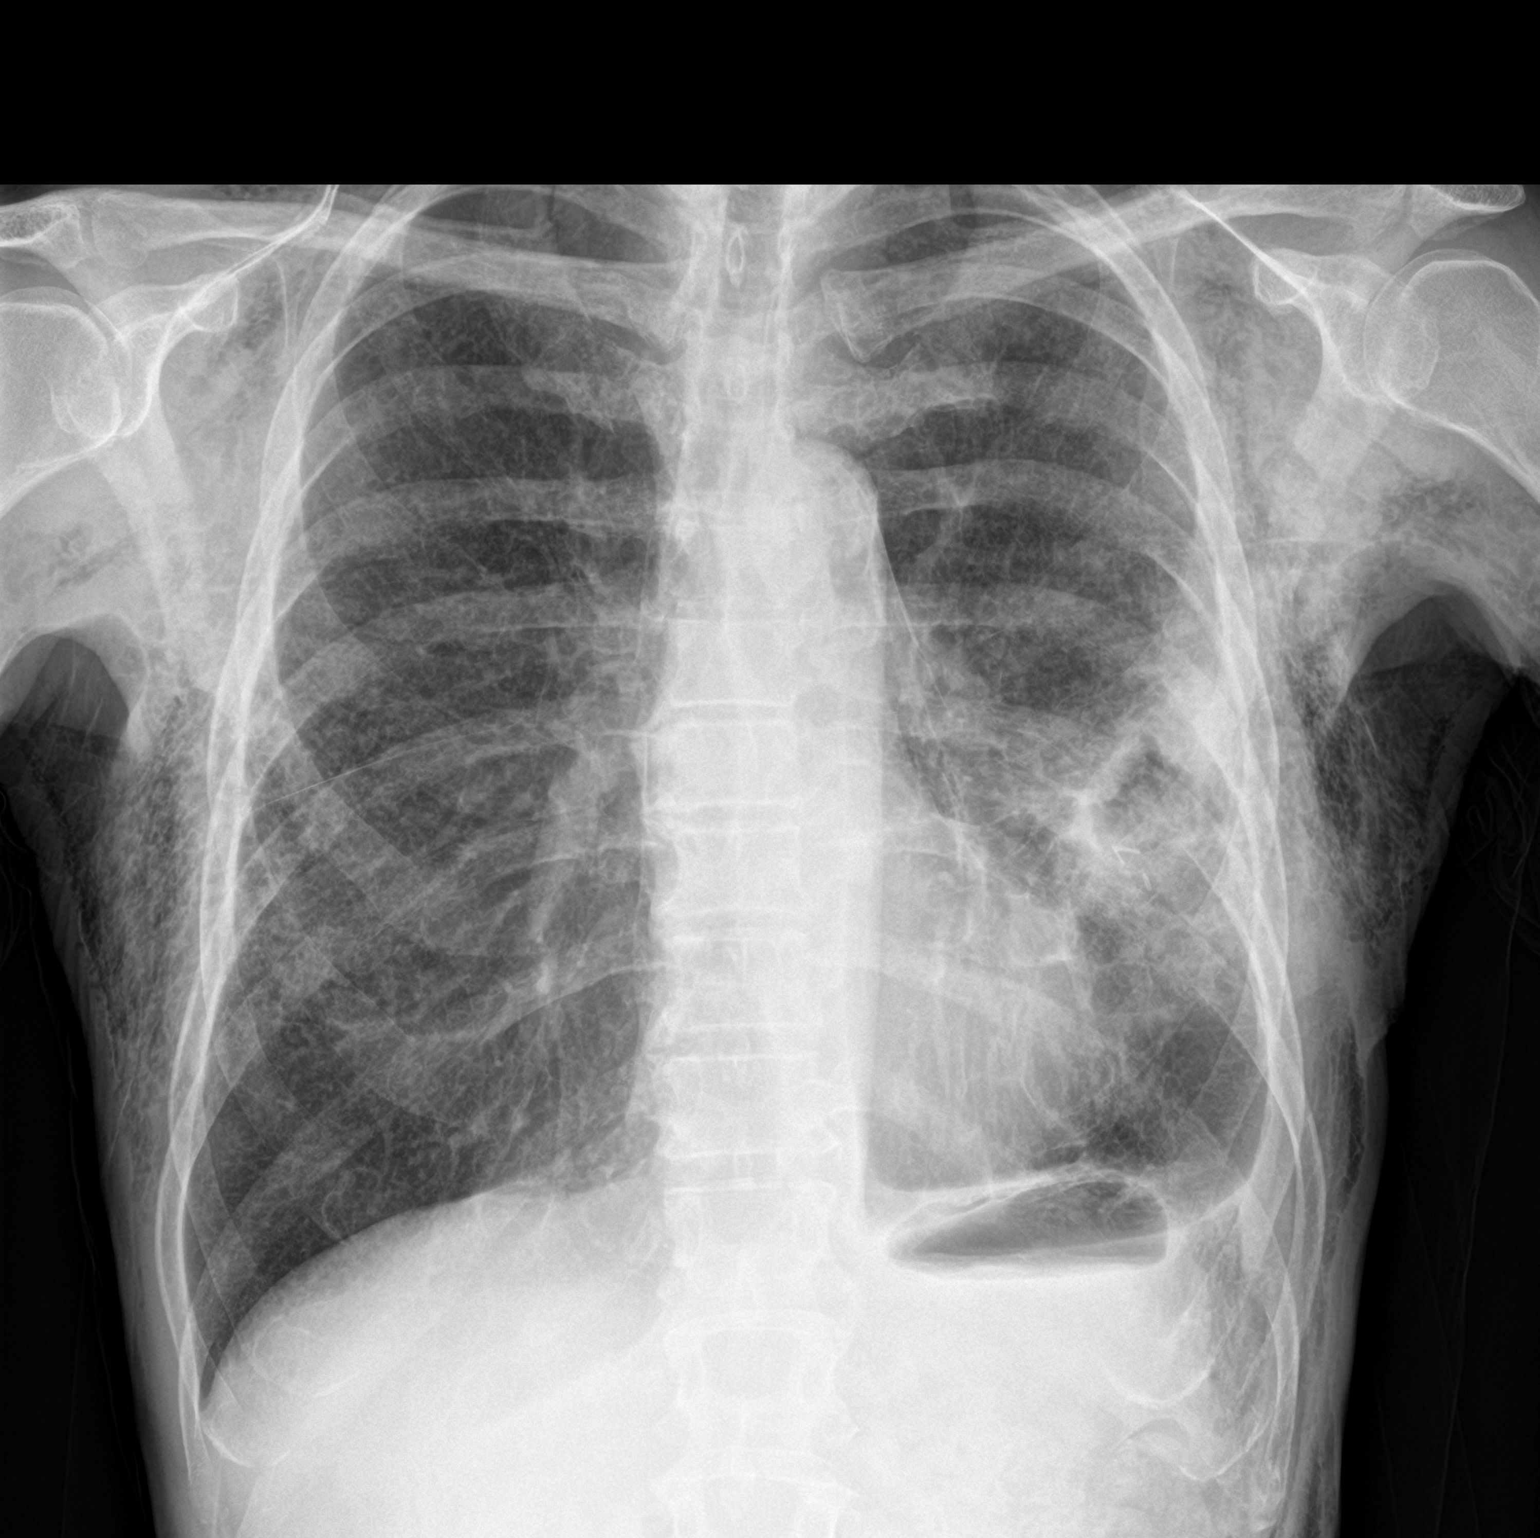

[chest lat]
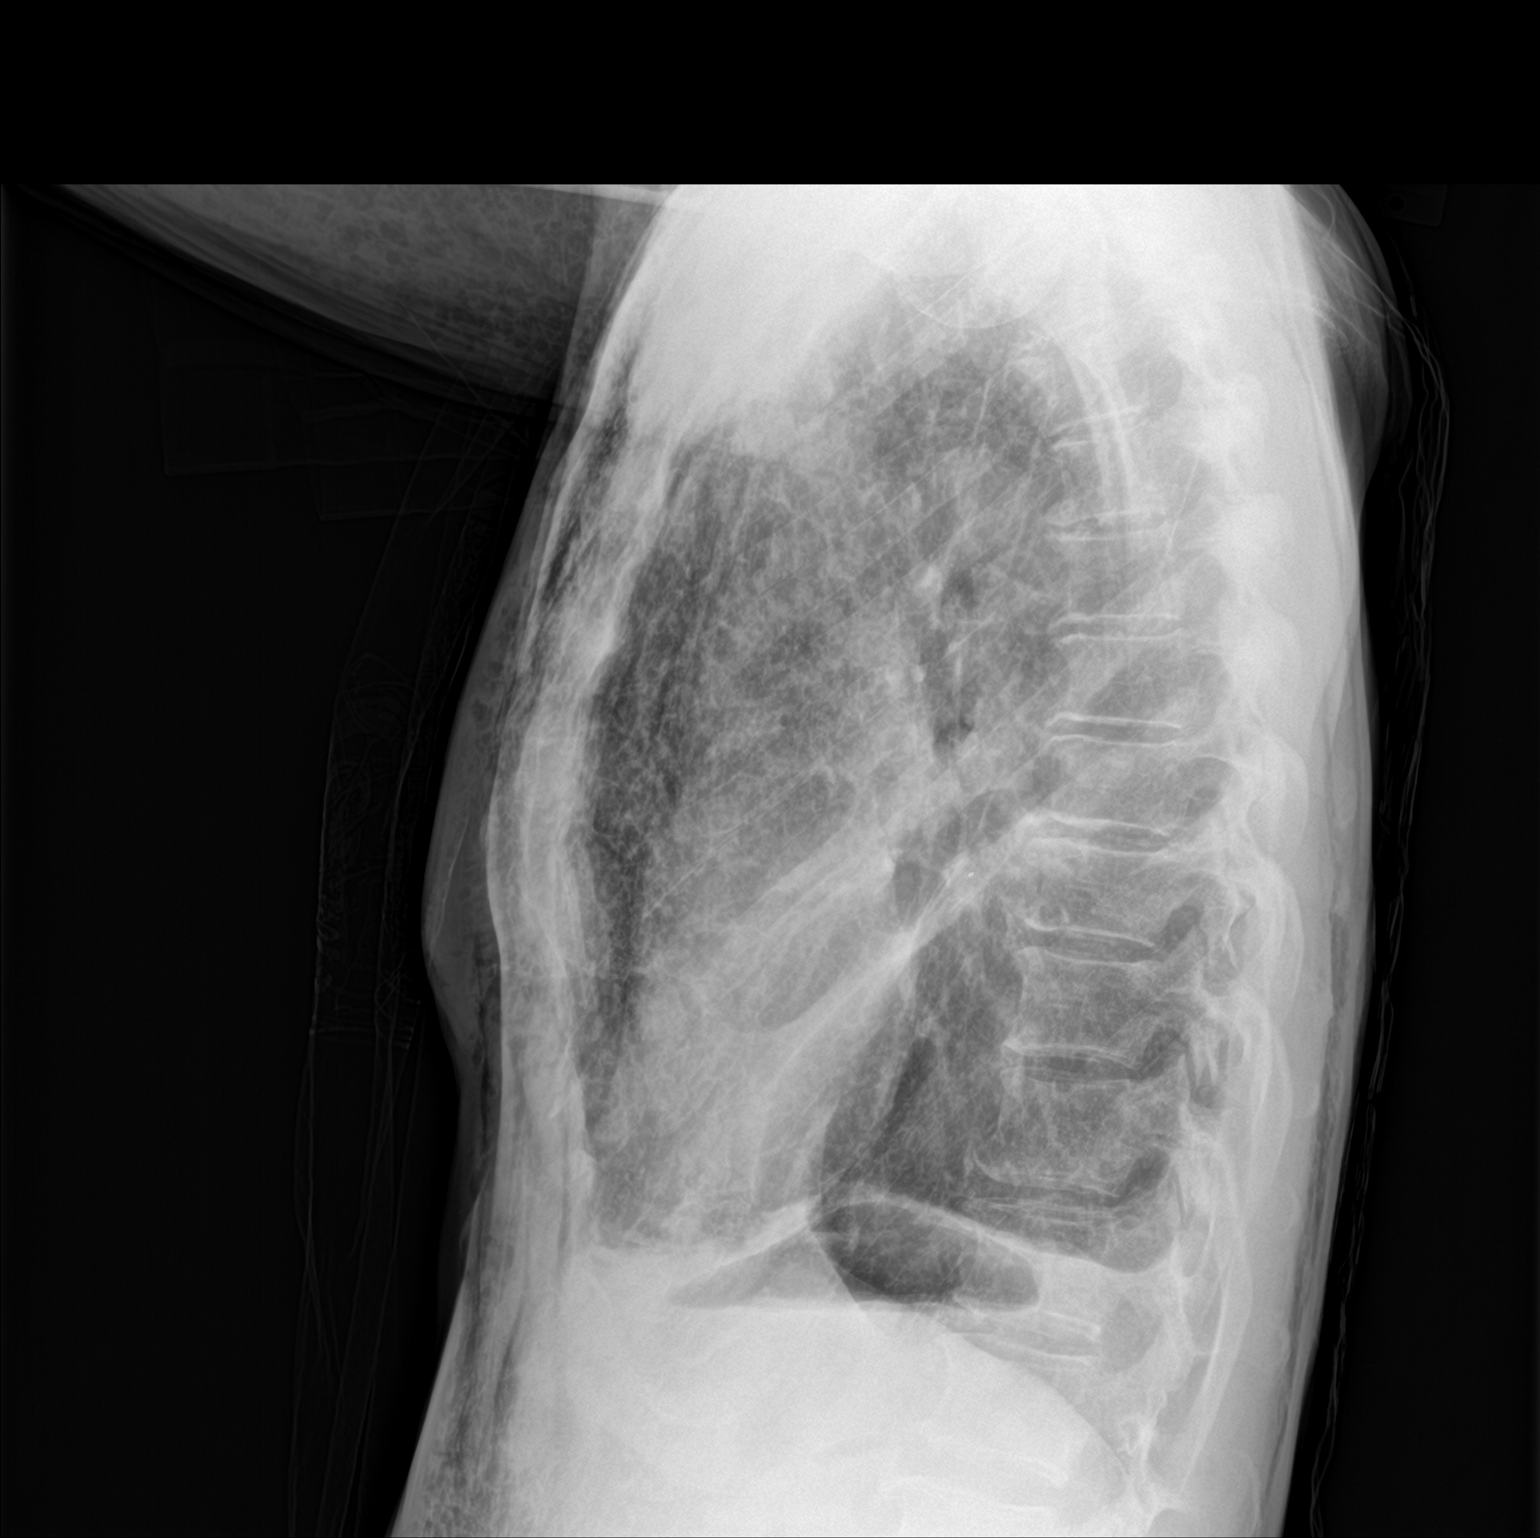

[2 of 2 positions shown; findings below may reference images not displayed]

FINDINGS: Extensive subcutaneous emphysema about the chest is somewhat
improved since the most recent exam. Cavitary lesion in the left mid
lung is again seen and unchanged. No pneumothorax. Right lung is
clear. Heart size is normal.
IMPRESSION: No new abnormality since the prior exam.

Decreased subcutaneous emphysema since the most recent study.

Stable appearance of postoperative change left chest.

## 2018-12-11 MED ORDER — SERTRALINE HCL 50 MG PO TABS
150.0000 mg | ORAL_TABLET | Freq: Every day | ORAL | Status: DC
  Administered 2018-12-12 – 2018-12-13 (×2): 150 mg via ORAL
  Filled 2018-12-11 (×3): qty 3

## 2018-12-11 MED ORDER — SERTRALINE HCL 50 MG PO TABS
50.0000 mg | ORAL_TABLET | Freq: Once | ORAL | Status: AC
  Administered 2018-12-11: 50 mg via ORAL
  Filled 2018-12-11: qty 1

## 2018-12-11 MED ORDER — GABAPENTIN 300 MG PO CAPS
300.0000 mg | ORAL_CAPSULE | Freq: Three times a day (TID) | ORAL | Status: DC
  Administered 2018-12-11 – 2018-12-13 (×6): 300 mg via ORAL
  Filled 2018-12-11 (×9): qty 1

## 2018-12-11 MED ORDER — TRAMADOL HCL 50 MG PO TABS
100.0000 mg | ORAL_TABLET | Freq: Four times a day (QID) | ORAL | Status: DC | PRN
  Administered 2018-12-12 – 2018-12-17 (×8): 100 mg via ORAL
  Filled 2018-12-11 (×8): qty 2

## 2018-12-11 NOTE — Progress Notes (Signed)
[place] NOVEL CORONAVIRUS (COVID-19) DAILY CHECK-OFF SYMPTOMS - answer yes or no to each - every day NO YES  Have you had a fever in the past 24 hours?  . Fever (Temp > 37.80C / 100F) X   Have you had any of these symptoms in the past 24 hours? . New Cough .  Sore Throat  .  Shortness of Breath .  Difficulty Breathing .  Unexplained Body Aches   X   Have you had any one of these symptoms in the past 24 hours not related to allergies?   . Runny Nose .  Nasal Congestion .  Sneezing   X   If you have had runny nose, nasal congestion, sneezing in the past 24 hours, has it worsened?  X   EXPOSURES - check yes or no X   Have you traveled outside the state in the past 14 days?  X   Have you been in contact with someone with a confirmed diagnosis of COVID-19 or PUI in the past 14 days without wearing appropriate PPE?  X   Have you been living in the same home as a person with confirmed diagnosis of COVID-19 or a PUI (household contact)?    X   Have you been diagnosed with COVID-19?    X              What to do next: Answered NO to all: Answered YES to anything:   Proceed with unit schedule Follow the [facility] Inpatient Flowsheet.   

## 2018-12-11 NOTE — [facility] Group Notes (Signed)
Pt did not attend wrap up group this evening. Pt was resting in bed.
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
## 2018-12-11 NOTE — Tx Team (Signed)
Interdisciplinary Treatment and Diagnostic Plan Update  12/11/2018 Time of Session:  FIRMAN PETROW MRN: [id number]  Principal Diagnosis: <principal problem not specified>  Secondary Diagnoses: Active Problems:   MDD (major depressive disorder)   Current Medications:  Current Facility-Administered Medications  Medication Dose Route Frequency Provider Last Rate Last Dose  . alum & mag hydroxide-simeth (MAALOX/MYLANTA) 200-200-20 MG/5ML suspension 30 mL  30 mL Oral Q4H PRN Mordecai Maes, NP      . amLODipine (NORVASC) tablet 5 mg  5 mg Oral Daily Sharma Covert, MD   5 mg at 12/10/18 0931  . docusate sodium (COLACE) capsule 100 mg  100 mg Oral Daily PRN Sharma Covert, MD      . erythromycin ophthalmic ointment   Left Eye QHS Sharma Covert, MD      . folic acid (FOLVITE) tablet 1 mg  1 mg Oral Daily Sharma Covert, MD   1 mg at 12/10/18 0931  . gabapentin (NEURONTIN) capsule 200 mg  200 mg Oral TID Sharma Covert, MD   200 mg at 12/10/18 1659  . hydrOXYzine (ATARAX/VISTARIL) tablet 25 mg  25 mg Oral Q6H PRN Rozetta Nunnery, NP      . levothyroxine (SYNTHROID) tablet 175 mcg  175 mcg Oral Q0600 Sharma Covert, MD   175 mcg at 12/10/18 1213  . magnesium hydroxide (MILK OF MAGNESIA) suspension 30 mL  30 mL Oral Daily PRN Mordecai Maes, NP      . sertraline (ZOLOFT) tablet 100 mg  100 mg Oral Daily Sharma Covert, MD   100 mg at 12/10/18 0931  . thiamine (VITAMIN B-1) tablet 100 mg  100 mg Oral Daily Sharma Covert, MD   100 mg at 12/10/18 0931  . traMADol (ULTRAM) tablet 50 mg  50 mg Oral Q6H PRN Sharma Covert, MD   50 mg at 12/10/18 0930  . traZODone (DESYREL) tablet 50 mg  50 mg Oral QHS PRN Sharma Covert, MD       PTA Medications: Medications Prior to Admission  Medication Sig Dispense Refill Last Dose  . amLODipine (NORVASC) 5 MG tablet Take 5 mg by mouth daily.     . Ibuprofen-diphenhydrAMINE Cit (ADVIL PM) 200-38 MG TABS Take 1 tablet  by mouth at bedtime. Pt takes nightly to sleep     . levothyroxine (SYNTHROID, LEVOTHROID) 175 MCG tablet Take 175 mcg by mouth daily before breakfast.     . olmesartan (BENICAR) 20 MG tablet Take 20 mg by mouth daily.     . sertraline (ZOLOFT) 100 MG tablet Take 100 mg by mouth daily.     Marland Kitchen acetaminophen (TYLENOL) 325 MG tablet Take 2 tablets (650 mg total) by mouth every 6 (six) hours as needed for fever. (Patient not taking: Reported on 12/10/2018)   Not Taking at Unknown time  . chlorpheniramine-HYDROcodone (TUSSIONEX) 10-8 MG/5ML SUER Take 5 mLs by mouth 3 (three) times daily. (Patient not taking: Reported on 12/10/2018) 140 mL  Not Taking at Unknown time  . docusate sodium (COLACE) 100 MG capsule Take 1 capsule (100 mg total) by mouth 2 (two) times daily. (Patient not taking: Reported on 12/10/2018) 10 capsule 0 Not Taking at Unknown time  . ferrous sulfate 325 (65 FE) MG tablet Take 1 tablet (325 mg total) by mouth 2 (two) times daily with a meal. (Patient not taking: Reported on 12/10/2018)  3 Not Taking at Unknown time  . folic acid (FOLVITE) 1 MG tablet Take  1 tablet (1 mg total) by mouth daily. (Patient not taking: Reported on 12/10/2018)   Not Taking at Unknown time  . guaiFENesin (ROBITUSSIN) 100 MG/5ML SOLN Take 15 mLs (300 mg total) by mouth every 4 (four) hours as needed for cough or to loosen phlegm. (Patient not taking: Reported on 12/10/2018) 236 mL 0 Not Taking at Unknown time  . methocarbamol (ROBAXIN) 500 MG tablet Take 1 tablet (500 mg total) by mouth every 6 (six) hours as needed for muscle spasms. (Patient not taking: Reported on 12/10/2018)   Not Taking at Unknown time  . Multiple Vitamins-Iron (MULTIVITAMINS WITH IRON) TABS tablet Take 1 tablet by mouth daily. (Patient not taking: Reported on 12/10/2018)  0 Not Taking at Unknown time  . polyethylene glycol (MIRALAX / GLYCOLAX) 17 g packet Take 17 g by mouth daily. (Patient not taking: Reported on 12/10/2018) 14 each 0 Not Taking at Unknown time   . senna-docusate (SENOKOT-S) 8.6-50 MG tablet Take 1 tablet by mouth at bedtime. (Patient not taking: Reported on 12/10/2018)   Not Taking at Unknown time  . thiamine 100 MG tablet Take 1 tablet (100 mg total) by mouth daily. (Patient not taking: Reported on 12/10/2018)   Not Taking at Unknown time    Patient Stressors: Financial difficulties Health problems Marital or family conflict  Patient Strengths: Average or above average intelligence Capable of independent living Communication skills  Treatment Modalities: Medication Management, Group therapy, Case management,  1 to 1 session with clinician, Psychoeducation, Recreational therapy.   Physician Treatment Plan for Primary Diagnosis: <principal problem not specified> Long Term Goal(s): Improvement in symptoms so as ready for discharge Improvement in symptoms so as ready for discharge   Short Term Goals: Ability to identify changes in lifestyle to reduce recurrence of condition will improve Ability to verbalize feelings will improve Ability to disclose and discuss suicidal ideas Ability to demonstrate self-control will improve Ability to identify and develop effective coping behaviors will improve Ability to maintain clinical measurements within normal limits will improve Compliance with prescribed medications will improve Ability to identify triggers associated with substance abuse/mental health issues will improve Ability to identify changes in lifestyle to reduce recurrence of condition will improve Ability to verbalize feelings will improve Ability to disclose and discuss suicidal ideas Ability to demonstrate self-control will improve Ability to identify and develop effective coping behaviors will improve Ability to maintain clinical measurements within normal limits will improve Compliance with prescribed medications will improve Ability to identify triggers associated with substance abuse/mental health issues will  improve  Medication Management: Evaluate patient's response, side effects, and tolerance of medication regimen.  Therapeutic Interventions: 1 to 1 sessions, Unit Group sessions and Medication administration.  Evaluation of Outcomes: Progressing  Physician Treatment Plan for Secondary Diagnosis: Active Problems:   MDD (major depressive disorder)  Long Term Goal(s): Improvement in symptoms so as ready for discharge Improvement in symptoms so as ready for discharge   Short Term Goals: Ability to identify changes in lifestyle to reduce recurrence of condition will improve Ability to verbalize feelings will improve Ability to disclose and discuss suicidal ideas Ability to demonstrate self-control will improve Ability to identify and develop effective coping behaviors will improve Ability to maintain clinical measurements within normal limits will improve Compliance with prescribed medications will improve Ability to identify triggers associated with substance abuse/mental health issues will improve Ability to identify changes in lifestyle to reduce recurrence of condition will improve Ability to verbalize feelings will improve Ability to disclose and  discuss suicidal ideas Ability to demonstrate self-control will improve Ability to identify and develop effective coping behaviors will improve Ability to maintain clinical measurements within normal limits will improve Compliance with prescribed medications will improve Ability to identify triggers associated with substance abuse/mental health issues will improve     Medication Management: Evaluate patient's response, side effects, and tolerance of medication regimen.  Therapeutic Interventions: 1 to 1 sessions, Unit Group sessions and Medication administration.  Evaluation of Outcomes: Progressing   RN Treatment Plan for Primary Diagnosis: <principal problem not specified> Long Term Goal(s): Knowledge of disease and therapeutic  regimen to maintain health will improve  Short Term Goals: Ability to remain free from injury will improve, Ability to verbalize frustration and anger appropriately will improve, Ability to demonstrate self-control, Ability to participate in decision making will improve, Ability to disclose and discuss suicidal ideas and Ability to identify and develop effective coping behaviors will improve  Medication Management: RN will administer medications as ordered by provider, will assess and evaluate patient's response and provide education to patient for prescribed medication. RN will report any adverse and/or side effects to prescribing provider.  Therapeutic Interventions: 1 on 1 counseling sessions, Psychoeducation, Medication administration, Evaluate responses to treatment, Monitor vital signs and CBGs as ordered, Perform/monitor CIWA, COWS, AIMS and Fall Risk screenings as ordered, Perform wound care treatments as ordered.  Evaluation of Outcomes: Progressing   LCSW Treatment Plan for Primary Diagnosis: <principal problem not specified> Long Term Goal(s): Safe transition to appropriate next level of care at discharge, Engage patient in therapeutic group addressing interpersonal concerns.  Short Term Goals: Engage patient in aftercare planning with referrals and resources  Therapeutic Interventions: Assess for all discharge needs, 1 to 1 time with Social worker, Explore available resources and support systems, Assess for adequacy in community support network, Educate family and significant other(s) on suicide prevention, Complete Psychosocial Assessment, Interpersonal group therapy.  Evaluation of Outcomes: Not Progressing Patient declined OP referrals at this time.    Progress in Treatment: Attending groups: No. Participating in groups: No. Taking medication as prescribed: Yes. Toleration medication: Yes. Family/Significant other contact made: No, will contact:  the patient's wife Patient  understands diagnosis: Yes. Discussing patient identified problems/goals with staff: Yes. Medical problems stabilized or resolved: Yes. Denies suicidal/homicidal ideation: Yes. Issues/concerns per patient self-inventory: No. Other:   New problem(s) identified: None   New Short Term/Long Term Goal(s): medication stabilization, elimination of SI thoughts, development of comprehensive mental wellness plan.    Patient Goals:    Discharge Plan or Barriers: Patient plans to return home with his wife at discharge. He is currently declining any outpatient referrals at this time.   Reason for Continuation of Hospitalization: Aggression Depression Medication stabilization Suicidal ideation  Estimated Length of Stay: 3-5 days   Attendees: Patient: 12/11/2018 8:06 AM  Physician: Dr. Myles Lipps, MD 12/11/2018 8:06 AM  Nursing: Arlyss Repress, RN 12/11/2018 8:06 AM  RN Care Manager: 12/11/2018 8:06 AM  Social Worker: Radonna Ricker, [place] 12/11/2018 8:06 AM  Recreational Therapist:  12/11/2018 8:06 AM  Other:  12/11/2018 8:06 AM  Other:  12/11/2018 8:06 AM  Other: 12/11/2018 8:06 AM    Scribe for Treatment Team: Marylee Floras, [place] 12/11/2018 8:06 AM
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
## 2018-12-11 NOTE — Progress Notes (Signed)
Pt in room throughout evening. Pt rated his day a "5" and that his goal is to get out of here. Pt did ask for linens to try and take a shower tomorrow. Pt did come to med room to receive trazodone. Pt was given gatorade, but refused snack. Pt denies SI/HI or hallucinations (a) 15 min checks (r) safety maintained.

## 2018-12-11 NOTE — Progress Notes (Signed)
[facility] NOVEL CORONAVIRUS (COVID-19) DAILY CHECK-OFF SYMPTOMS - answer yes or no to each - every day NO YES  Have you had a fever in the past 24 hours?  . Fever (Temp > 37.80C / 100F) X   Have you had any of these symptoms in the past 24 hours? . New Cough .  Sore Throat  .  Shortness of Breath .  Difficulty Breathing .  Unexplained Body Aches   X   Have you had any one of these symptoms in the past 24 hours not related to allergies?   . Runny Nose .  Nasal Congestion .  Sneezing   X   If you have had runny nose, nasal congestion, sneezing in the past 24 hours, has it worsened?  X   EXPOSURES - check yes or no X   Have you traveled outside the state in the past 14 days?  X   Have you been in contact with someone with a confirmed diagnosis of COVID-19 or PUI in the past 14 days without wearing appropriate PPE?  X   Have you been living in the same home as a person with confirmed diagnosis of COVID-19 or a PUI (household contact)?    X   Have you been diagnosed with COVID-19?    X              What to do next: Answered NO to all: Answered YES to anything:   Proceed with unit schedule Follow the [facility] Inpatient Flowsheet.   

## 2018-12-12 MED ORDER — TRAZODONE HCL 100 MG PO TABS
100.0000 mg | ORAL_TABLET | Freq: Every evening | ORAL | Status: DC | PRN
  Administered 2018-12-12: 100 mg via ORAL
  Filled 2018-12-12: qty 1
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
## 2018-12-12 NOTE — Plan of Care (Addendum)
Patient self inventory- Patient slept fair last night, sleep medication was requested and was helpful. Appetite is good, energy level low, concentration good. Depression, hopelessness, and anxiety rated 6, 3, 4 out of 10. Patient endorses pain 5/10 in his chest. Pain medication has been helpful. Patient's goal is "stay positive about getting to go home, see my wife, family, grandchildren, and my animals. Try to stay positive and keep a good attitude." Patient is compliant with medications- no side effects noted. Safety is maintained with 15 minute checks as well as environmental checks. Patient also told writer that yesterday was his ninth wedding anniversary with his wife and he was even more depressed because he was in here. Patient was encouraged to speak with the doctor about what is going on.  Problem: Education: Goal: Emotional status will improve Outcome: Progressing Goal: Mental status will improve Outcome: Progressing Goal: Verbalization of understanding the information provided will improve Outcome: Progressing   Problem: Activity: Goal: Interest or engagement in leisure activities will improve Outcome: Progressing

## 2018-12-12 NOTE — Progress Notes (Signed)
Adult Psychoeducational Group Note  Date:  12/12/2018 Time:  9:22 PM  Group Topic/Focus:  Wrap-Up Group:   The focus of this group is to help patients review their daily goal of treatment and discuss progress on daily workbooks.  Participation Level:  Active  Participation Quality:  Appropriate  Affect:  Appropriate  Cognitive:  Appropriate  Insight: Appropriate  Engagement in Group:  Engaged  Modes of Intervention:  Discussion  Additional Comments:  Pt stated his goal was try to be in a better frame of mind.  Pt stated this goal is ongoing.  Pt rated the day at a 7/10.  Cynara Tatham 12/12/2018, 9:22 PM

## 2018-12-12 NOTE — [facility] Suicide Risk Assessment (Signed)
[place] INPATIENT:  Family/Significant Other Suicide Prevention Education  Suicide Prevention Education:  Education Completed; with wife, Atha Mcbain [phone]) has been identified by the patient as the family member/significant other with whom the patient will be residing, and identified as the person(s) who will aid the patient in the event of a mental health crisis (suicidal ideations/suicide attempt).  With written consent from the patient, the family member/significant other has been provided the following suicide prevention education, prior to the and/or following the discharge of the patient.  The suicide prevention education provided includes the following:  Suicide risk factors  Suicide prevention and interventions  National Suicide Hotline telephone number  [facility] assessment telephone number  [facility] Emergency Assistance [place] and/or Residential Mobile Crisis Unit telephone number  Request made of family/significant other to:  Remove weapons (e.g., guns, rifles, knives), all items previously/currently identified as safety concern.    Remove drugs/medications (over-the-counter, prescriptions, illicit drugs), all items previously/currently identified as a safety concern.  The family member/significant other verbalizes understanding of the suicide prevention education information provided.  The family member/significant other agrees to remove the items of safety concern listed above.  [name] reports that she is concerned that the patient is not benefiting from treatment due to his consistent irritability and reluctance to participate in the therapeutic milieu. [name] reports that the patient has an extensive psychiatric history that involves depression and chronic suicidality. She reports his symptoms seemed to worsen over the last two years. She did not identify any specific triggers/stressors.   [name] states that the patient isolates and  withdraws himself when "he cannot deal with reality". She says with the patient not engaging, that is a sign that he continues to be severely depressed and possibly suicidal. She reports the patient knows what to say in order to "fool his doctors".   [name] shared that she is most fearful that the patient will discharge too soon and will have another intense suicidal attempt again due to similarities in a past hospitalization two years ago. CSW ensured [name] that this information would be shared with the patient's current provider. CSW will continue to follow.   Marylee Floras 12/12/2018, 12:47 PM

## 2018-12-13 MED ORDER — SERTRALINE HCL 100 MG PO TABS
100.0000 mg | ORAL_TABLET | Freq: Every day | ORAL | Status: DC
  Administered 2018-12-14 – 2018-12-19 (×6): 100 mg via ORAL
  Filled 2018-12-13 (×8): qty 1

## 2018-12-13 MED ORDER — CARBAMAZEPINE 200 MG PO TABS
200.0000 mg | ORAL_TABLET | Freq: Every day | ORAL | Status: DC
  Administered 2018-12-13 – 2018-12-18 (×6): 200 mg via ORAL
  Filled 2018-12-13 (×8): qty 1

## 2018-12-13 MED ORDER — TRAZODONE HCL 50 MG PO TABS
50.0000 mg | ORAL_TABLET | Freq: Every evening | ORAL | Status: DC | PRN
  Administered 2018-12-13 – 2018-12-16 (×4): 50 mg via ORAL
  Filled 2018-12-13 (×4): qty 1
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
## 2018-12-13 NOTE — Progress Notes (Addendum)
CSW faxed a referral to [facility] per psychiatry recommendation. CSW faxed referral form to [place] for authorization number. Once an authorization number is provided, CSW will forward that information to [facility] for inpatient hospitalization review.   1:00pm- Intake at [facility] [phone]) confirms receipt of referral packet. CSW still waiting for [employer] authorization number. CSW will call [place] with the authorization number when it becomes available  Stephanie Acre, [place] Social Worker
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
## 2018-12-13 NOTE — Progress Notes (Signed)
D: Patient alert and oriented. Affect/mood: depressed in mood, flat in affect. Denies SI, HI, AVH at this time, though patient expresses uncertainty about his discharge, sharing that he is worried about legal issues he will have to deal with once he leaves the hospital. Denies pain. Goal: "to go home". Patient endorses feelings of dizziness per self inventory sheet, though denies when asked by this Probation officer. Fluids encouraged throughout the day.   A: Scheduled medications administered to patient per MD order. Support and encouragement provided. Routine safety checks conducted every 15 minutes. Patient informed to notify staff with problems or concerns.  R: No adverse drug reactions noted. Patient contracts for safety at this time. Patient interacts well with others on the unit. Patient remains safe at this time. Will continue to monitor.

## 2018-12-13 NOTE — Progress Notes (Signed)
[facility] NOVEL CORONAVIRUS (COVID-19) DAILY CHECK-OFF SYMPTOMS - answer yes or no to each - every day NO YES  Have you had a fever in the past 24 hours?  . Fever (Temp > 37.80C / 100F) X   Have you had any of these symptoms in the past 24 hours? . New Cough .  Sore Throat  .  Shortness of Breath .  Difficulty Breathing .  Unexplained Body Aches   X   Have you had any one of these symptoms in the past 24 hours not related to allergies?   . Runny Nose .  Nasal Congestion .  Sneezing   X   If you have had runny nose, nasal congestion, sneezing in the past 24 hours, has it worsened?  X   EXPOSURES - check yes or no X   Have you traveled outside the state in the past 14 days?  X   Have you been in contact with someone with a confirmed diagnosis of COVID-19 or PUI in the past 14 days without wearing appropriate PPE?  X   Have you been living in the same home as a person with confirmed diagnosis of COVID-19 or a PUI (household contact)?    X   Have you been diagnosed with COVID-19?    X              What to do next: Answered NO to all: Answered YES to anything:   Proceed with unit schedule Follow the [facility] Inpatient Flowsheet.   

## 2018-12-13 NOTE — Progress Notes (Signed)
Psychoeducational Group Note  Date:  12/13/2018 Time: 1050  Group Topic/Focus:  Recovery Goals:   The focus of this group is to identify appropriate goals for recovery and establish a plan to achieve them.  Participation Level: Did Not Attend  Participation Quality:  Not Applicable  Affect:  Not Applicable  Cognitive:  Not Applicable  Insight:  Not Applicable  Engagement in Group: Not Applicable  Additional Comments:  Pt was asleep and unable to attend the morning group session.  Jailyn Leeson E 12/13/2018, 10:41 AM

## 2018-12-13 NOTE — Progress Notes (Signed)
CSW spoke with patient individually on the unit after group. Patient appeared less irritable when speaking one on one, he spoke of his desire to get back home. Patient shared he is trying to engage in treatment, he is coming out of his room and is noted to be appropriately interacting in dayroom and is participating in groups.  He disclosed that he is very anxious about his upcoming court date in June for what will be his 10th DUI. He is afraid he will be incarcerated for several years. He hopes to go home and spend time with his wife, grandchildren, and animals prior to his court date and potential incarceration.   CSW asked if patient would be receptive to outpatient follow up, which he has previously declined. Patient agreeable to follow up but cited transportation issues as a barrier (DUI's). CSW explained that an unlikely benefit of COVID 19 is that appointments are now conducted via phone. Patient is agreeable to following up with [facility] for outpatient follow up.  Stephanie Acre, LCSW-A Clinical Social Worker

## 2018-12-13 NOTE — Progress Notes (Signed)
   12/13/18 0041  COVID-19 Daily Checkoff  Have you had a fever (temp > 37.80C/100F)  in the past 24 hours?  No  If you have had runny nose, nasal congestion, sneezing in the past 24 hours, has it worsened? No  COVID-19 EXPOSURE  Have you traveled outside the state in the past 14 days? No  Have you been in contact with someone with a confirmed diagnosis of COVID-19 or PUI in the past 14 days without wearing appropriate PPE? No  Have you been living in the same home as a person with confirmed diagnosis of COVID-19 or a PUI (household contact)? No  Have you been diagnosed with COVID-19? No

## 2018-12-14 LAB — COMPREHENSIVE METABOLIC PANEL
ALT: 11 U/L (ref 0–44)
AST: 13 U/L — ABNORMAL LOW (ref 15–41)
Albumin: 3.1 g/dL — ABNORMAL LOW (ref 3.5–5.0)
Alkaline Phosphatase: 89 U/L (ref 38–126)
Anion gap: 8 (ref 5–15)
BUN: 16 mg/dL (ref 6–20)
CO2: 28 mmol/L (ref 22–32)
Calcium: 9.2 mg/dL (ref 8.9–10.3)
Chloride: 105 mmol/L (ref 98–111)
Creatinine, Ser: 0.76 mg/dL (ref 0.61–1.24)
GFR calc Af Amer: >60 mL/min (ref >60)
GFR calc non Af Amer: >60 mL/min (ref >60)
Glucose, Bld: 99 mg/dL (ref 70–99)
Potassium: 4.4 mmol/L (ref 3.5–5.1)
Sodium: 141 mmol/L (ref 135–145)
Total Bilirubin: 0.4 mg/dL (ref 0.3–1.2)
Total Protein: 6.5 g/dL (ref 6.5–8.1)

## 2018-12-14 LAB — CBC WITH DIFFERENTIAL/PLATELET
Abs Immature Granulocytes: 0.02 10*3/uL (ref 0.00–0.07)
Basophils Absolute: 0 10*3/uL (ref 0.0–0.1)
Basophils Relative: 0 %
Eosinophils Absolute: 0.9 10*3/uL — ABNORMAL HIGH (ref 0.0–0.5)
Eosinophils Relative: 9 %
HCT: 30.9 % — ABNORMAL LOW (ref 39.0–52.0)
Hemoglobin: 9.5 g/dL — ABNORMAL LOW (ref 13.0–17.0)
Immature Granulocytes: 0 %
Lymphocytes Relative: 30 %
Lymphs Abs: 3.1 10*3/uL (ref 0.7–4.0)
MCH: 31.7 pg (ref 26.0–34.0)
MCHC: 30.7 g/dL (ref 30.0–36.0)
MCV: 103 fL — ABNORMAL HIGH (ref 80.0–100.0)
Monocytes Absolute: 0.9 10*3/uL (ref 0.1–1.0)
Monocytes Relative: 8 %
Neutro Abs: 5.5 10*3/uL (ref 1.7–7.7)
Neutrophils Relative %: 53 %
Platelets: 442 10*3/uL — ABNORMAL HIGH (ref 150–400)
RBC: 3 MIL/uL — ABNORMAL LOW (ref 4.22–5.81)
RDW: 15 % (ref 11.5–15.5)
WBC: 10.3 10*3/uL (ref 4.0–10.5)
nRBC: 0 % (ref 0.0–0.2)
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
## 2018-12-14 NOTE — Progress Notes (Signed)
   12/14/18 2311  COVID-19 Daily Checkoff  Have you had a fever (temp > 37.80C/100F)  in the past 24 hours?  No  COVID-19 EXPOSURE  Have you traveled outside the state in the past 14 days? No  Have you been in contact with someone with a confirmed diagnosis of COVID-19 or PUI in the past 14 days without wearing appropriate PPE? No  Have you been living in the same home as a person with confirmed diagnosis of COVID-19 or a PUI (household contact)? No  Have you been diagnosed with COVID-19? No

## 2018-12-14 NOTE — Progress Notes (Signed)
Pt was invited to group via intercom, and chose not attend group.

## 2018-12-14 NOTE — Progress Notes (Signed)
   12/14/18 0002  COVID-19 Daily Checkoff  Have you had a fever (temp > 37.80C/100F)  in the past 24 hours?  No  COVID-19 EXPOSURE  Have you traveled outside the state in the past 14 days? No  Have you been in contact with someone with a confirmed diagnosis of COVID-19 or PUI in the past 14 days without wearing appropriate PPE? No  Have you been living in the same home as a person with confirmed diagnosis of COVID-19 or a PUI (household contact)? No  Have you been diagnosed with COVID-19? No

## 2018-12-14 NOTE — [facility] Group Notes (Signed)
  Date:  12/14/2018 Time:  2:59 PM  Group Topic/Focus:  Identifying Needs:   The focus of this group is to help patients identify their personal needs that have been historically problematic and identify healthy behaviors to address their needs.  Participation Level:  Active  Participation Quality:  Appropriate and Attentive  Affect:  Appropriate  Cognitive:  Appropriate  Insight: Appropriate  Engagement in Group:  Engaged  Modes of Intervention:  Discussion, Education, Exploration, Problem-solving, Socialization and Support  Additional Comments:  Pt attended and participated during RN psychoeducational group.  Harriet Masson 12/14/2018, 2:59 PM
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
## 2018-12-14 NOTE — Progress Notes (Signed)
D: Pt was in dayroom upon initial approach.  Pt presents with depressed affect and mood.  He reports his day was "okay."  Goal is to "get a halfway decent night sleep and tomorrow to talk to the doctor and start getting a plan to get out of here."  Pt denies SI/HI, denies hallucinations, reports chest pain of 7/10.  Pt has been visible in milieu interacting with peers and staff appropriately.  Pt attended evening group.    A: Met with pt 1:1.  Actively listened to pt and offered support and encouragement. Medication administered per order.  PRN medication administered for pain and sleep.  Q15 minute safety checks maintained.  R: Pt is safe on the unit.  Pt is compliant with medications.  Pt verbally contracts for safety.  Will continue to monitor and assess.
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
## 2018-12-15 NOTE — [facility] Group Notes (Signed)
[facility] Group Notes:  (Nursing/MHT/Case Management/Adjunct)  Date:  12/15/2018  Time:  9:40 AM  Type of Therapy:  Goal Setting  Participation Level:  Active  Participation Quality:  Appropriate, Attentive and Sharing  Affect:  Appropriate  Cognitive:  Alert, Appropriate and Oriented  Insight:  Improving  Engagement in Group:  Engaged  Modes of Intervention:  Discussion and Exploration  Summary of Progress/Problems: [name]'s treatment goal is to follow up with outpatient therapy/treatment in order to manage his anger emotions and to work on his personal development.  Franks Field 12/15/2018, 9:40 AM

## 2018-12-15 NOTE — Progress Notes (Signed)
D: Pt was in bed in his room upon initial approach.  Pt presents with depressed affect and mood.  He reports "I feel safe to discharge tomorrow but evidently my family don't."  Pt denies SI/HI, denies hallucinations, reports neck pain of 9/10.  Pt has been visible in milieu interacting with peers and staff appropriately.     A: Met with pt 1:1.  Actively listened to pt and offered support and encouragement. Medication administered per order.  PRN medication administered for pain and sleep.  Heat packs provided for pain.  Q15 minute safety checks maintained.  R: Pt is safe on the unit.  Pt is compliant with medications.  Pt verbally contracts for safety.  Will continue to monitor and assess.

## 2018-12-15 NOTE — Progress Notes (Signed)
D:    Patient's self inventory sheet, patient has fair sleep, sleep medication helpful.  Good appetite, normal energy level, good concentration.  Rated depression 7, hopeless 2, anxiety 4.  Denied withdrawals.  Denied SI.  Pain back of neck, head, cough.  Physical pain, worst pain #8, chest, neck, head, cough.  Pain medication helpful sometimes.  Goal is talk to MD and discharge.  "Thank you for your patience and understanding."  Does have discharge plans. A:  Medications administered per MD orders.  Emotional support and encouragement given patient. R:  Denied SI and HI, contracts for safety.  Denied A/V hallucinations.  Safety maintained with 15 minute checks.

## 2018-12-15 NOTE — [facility] Group Notes (Signed)
[facility] Group Notes:  (Nursing)  Date:  12/15/2018  Time:  130 PM Type of Therapy:  Nurse Education  Participation Level:  Did Not Attend   Waymond Cera 12/15/2018, 7:12 PM

## 2018-12-15 NOTE — Progress Notes (Signed)
   12/15/18 2350  COVID-19 Daily Checkoff  Have you had a fever (temp > 37.80C/100F)  in the past 24 hours?  No  COVID-19 EXPOSURE  Have you traveled outside the state in the past 14 days? No  Have you been in contact with someone with a confirmed diagnosis of COVID-19 or PUI in the past 14 days without wearing appropriate PPE? No  Have you been living in the same home as a person with confirmed diagnosis of COVID-19 or a PUI (household contact)? No  Have you been diagnosed with COVID-19? No
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
## 2018-12-15 NOTE — Plan of Care (Signed)
Nurse discussed anxiety, depression and coping skills with patient.  
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
## 2018-12-16 LAB — CARBAMAZEPINE LEVEL, TOTAL: Carbamazepine Lvl: 7.5 ug/mL (ref 4.0–12.0)

## 2018-12-16 NOTE — Tx Team (Signed)
Interdisciplinary Treatment and Diagnostic Plan Update  12/16/2018 Time of Session: 6967 [name] MRN: [id number]  Principal Diagnosis: <principal problem not specified>  Secondary Diagnoses: Active Problems:   MDD (major depressive disorder)   Current Medications:  Current Facility-Administered Medications  Medication Dose Route Frequency Provider Last Rate Last Dose  . alum & mag hydroxide-simeth (MAALOX/MYLANTA) 200-200-20 MG/5ML suspension 30 mL  30 mL Oral Q4H PRN Mordecai Maes, NP      . amLODipine (NORVASC) tablet 5 mg  5 mg Oral Daily Sharma Covert, MD   5 mg at 12/16/18 0818  . carbamazepine (TEGRETOL) tablet 200 mg  200 mg Oral QHS Sharma Covert, MD   200 mg at 12/15/18 2107  . docusate sodium (COLACE) capsule 100 mg  100 mg Oral Daily PRN Sharma Covert, MD      . erythromycin ophthalmic ointment   Left Eye QHS Sharma Covert, MD      . folic acid (FOLVITE) tablet 1 mg  1 mg Oral Daily Sharma Covert, MD   1 mg at 12/16/18 0818  . hydrOXYzine (ATARAX/VISTARIL) tablet 25 mg  25 mg Oral Q6H PRN Lindon Romp A, NP   25 mg at 12/15/18 1139  . levothyroxine (SYNTHROID) tablet 175 mcg  175 mcg Oral Q0600 Sharma Covert, MD   175 mcg at 12/16/18 0630  . magnesium hydroxide (MILK OF MAGNESIA) suspension 30 mL  30 mL Oral Daily PRN Mordecai Maes, NP      . sertraline (ZOLOFT) tablet 100 mg  100 mg Oral Daily Sharma Covert, MD   100 mg at 12/16/18 0818  . thiamine (VITAMIN B-1) tablet 100 mg  100 mg Oral Daily Sharma Covert, MD   100 mg at 12/16/18 0818  . traMADol (ULTRAM) tablet 100 mg  100 mg Oral Q6H PRN Sharma Covert, MD   100 mg at 12/16/18 1334  . traZODone (DESYREL) tablet 50 mg  50 mg Oral QHS PRN Sharma Covert, MD   50 mg at 12/15/18 2107   PTA Medications: Medications Prior to Admission  Medication Sig Dispense Refill Last Dose  . amLODipine (NORVASC) 5 MG tablet Take 5 mg by mouth daily.     .  Ibuprofen-diphenhydrAMINE Cit (ADVIL PM) 200-38 MG TABS Take 1 tablet by mouth at bedtime. Pt takes nightly to sleep     . levothyroxine (SYNTHROID, LEVOTHROID) 175 MCG tablet Take 175 mcg by mouth daily before breakfast.     . olmesartan (BENICAR) 20 MG tablet Take 20 mg by mouth daily.     . sertraline (ZOLOFT) 100 MG tablet Take 100 mg by mouth daily.     Marland Kitchen acetaminophen (TYLENOL) 325 MG tablet Take 2 tablets (650 mg total) by mouth every 6 (six) hours as needed for fever. (Patient not taking: Reported on 12/10/2018)   Not Taking at Unknown time  . chlorpheniramine-HYDROcodone (TUSSIONEX) 10-8 MG/5ML SUER Take 5 mLs by mouth 3 (three) times daily. (Patient not taking: Reported on 12/10/2018) 140 mL  Not Taking at Unknown time  . docusate sodium (COLACE) 100 MG capsule Take 1 capsule (100 mg total) by mouth 2 (two) times daily. (Patient not taking: Reported on 12/10/2018) 10 capsule 0 Not Taking at Unknown time  . ferrous sulfate 325 (65 FE) MG tablet Take 1 tablet (325 mg total) by mouth 2 (two) times daily with a meal. (Patient not taking: Reported on 12/10/2018)  3 Not Taking at Unknown time  . folic  acid (FOLVITE) 1 MG tablet Take 1 tablet (1 mg total) by mouth daily. (Patient not taking: Reported on 12/10/2018)   Not Taking at Unknown time  . guaiFENesin (ROBITUSSIN) 100 MG/5ML SOLN Take 15 mLs (300 mg total) by mouth every 4 (four) hours as needed for cough or to loosen phlegm. (Patient not taking: Reported on 12/10/2018) 236 mL 0 Not Taking at Unknown time  . methocarbamol (ROBAXIN) 500 MG tablet Take 1 tablet (500 mg total) by mouth every 6 (six) hours as needed for muscle spasms. (Patient not taking: Reported on 12/10/2018)   Not Taking at Unknown time  . Multiple Vitamins-Iron (MULTIVITAMINS WITH IRON) TABS tablet Take 1 tablet by mouth daily. (Patient not taking: Reported on 12/10/2018)  0 Not Taking at Unknown time  . polyethylene glycol (MIRALAX / GLYCOLAX) 17 g packet Take 17 g by mouth daily. (Patient  not taking: Reported on 12/10/2018) 14 each 0 Not Taking at Unknown time  . senna-docusate (SENOKOT-S) 8.6-50 MG tablet Take 1 tablet by mouth at bedtime. (Patient not taking: Reported on 12/10/2018)   Not Taking at Unknown time  . thiamine 100 MG tablet Take 1 tablet (100 mg total) by mouth daily. (Patient not taking: Reported on 12/10/2018)   Not Taking at Unknown time    Patient Stressors: Financial difficulties Health problems Marital or family conflict  Patient Strengths: Average or above average intelligence Capable of independent living Communication skills  Treatment Modalities: Medication Management, Group therapy, Case management,  1 to 1 session with clinician, Psychoeducation, Recreational therapy.   Physician Treatment Plan for Primary Diagnosis: <principal problem not specified> Long Term Goal(s): Improvement in symptoms so as ready for discharge Improvement in symptoms so as ready for discharge   Short Term Goals: Ability to identify changes in lifestyle to reduce recurrence of condition will improve Ability to verbalize feelings will improve Ability to disclose and discuss suicidal ideas Ability to demonstrate self-control will improve Ability to identify and develop effective coping behaviors will improve Ability to maintain clinical measurements within normal limits will improve Compliance with prescribed medications will improve Ability to identify triggers associated with substance abuse/mental health issues will improve Ability to identify changes in lifestyle to reduce recurrence of condition will improve Ability to verbalize feelings will improve Ability to disclose and discuss suicidal ideas Ability to demonstrate self-control will improve Ability to identify and develop effective coping behaviors will improve Ability to maintain clinical measurements within normal limits will improve Compliance with prescribed medications will improve Ability to identify  triggers associated with substance abuse/mental health issues will improve  Medication Management: Evaluate patient's response, side effects, and tolerance of medication regimen.  Therapeutic Interventions: 1 to 1 sessions, Unit Group sessions and Medication administration.  Evaluation of Outcomes: Progressing  Physician Treatment Plan for Secondary Diagnosis: Active Problems:   MDD (major depressive disorder)  Long Term Goal(s): Improvement in symptoms so as ready for discharge Improvement in symptoms so as ready for discharge   Short Term Goals: Ability to identify changes in lifestyle to reduce recurrence of condition will improve Ability to verbalize feelings will improve Ability to disclose and discuss suicidal ideas Ability to demonstrate self-control will improve Ability to identify and develop effective coping behaviors will improve Ability to maintain clinical measurements within normal limits will improve Compliance with prescribed medications will improve Ability to identify triggers associated with substance abuse/mental health issues will improve Ability to identify changes in lifestyle to reduce recurrence of condition will improve Ability to verbalize feelings  will improve Ability to disclose and discuss suicidal ideas Ability to demonstrate self-control will improve Ability to identify and develop effective coping behaviors will improve Ability to maintain clinical measurements within normal limits will improve Compliance with prescribed medications will improve Ability to identify triggers associated with substance abuse/mental health issues will improve     Medication Management: Evaluate patient's response, side effects, and tolerance of medication regimen.  Therapeutic Interventions: 1 to 1 sessions, Unit Group sessions and Medication administration.  Evaluation of Outcomes: Progressing   RN Treatment Plan for Primary Diagnosis: <principal problem not  specified> Long Term Goal(s): Knowledge of disease and therapeutic regimen to maintain health will improve  Short Term Goals: Ability to remain free from injury will improve, Ability to verbalize frustration and anger appropriately will improve, Ability to demonstrate self-control, Ability to participate in decision making will improve, Ability to disclose and discuss suicidal ideas and Ability to identify and develop effective coping behaviors will improve  Medication Management: RN will administer medications as ordered by provider, will assess and evaluate patient's response and provide education to patient for prescribed medication. RN will report any adverse and/or side effects to prescribing provider.  Therapeutic Interventions: 1 on 1 counseling sessions, Psychoeducation, Medication administration, Evaluate responses to treatment, Monitor vital signs and CBGs as ordered, Perform/monitor CIWA, COWS, AIMS and Fall Risk screenings as ordered, Perform wound care treatments as ordered.  Evaluation of Outcomes: Progressing   LCSW Treatment Plan for Primary Diagnosis: <principal problem not specified> Long Term Goal(s): Safe transition to appropriate next level of care at discharge, Engage patient in therapeutic group addressing interpersonal concerns.  Short Term Goals: Engage patient in aftercare planning with referrals and resources  Therapeutic Interventions: Assess for all discharge needs, 1 to 1 time with Social worker, Explore available resources and support systems, Assess for adequacy in community support network, Educate family and significant other(s) on suicide prevention, Complete Psychosocial Assessment, Interpersonal group therapy.  Evaluation of Outcomes: Not Progressing Patient declined OP referrals at this time.    Progress in Treatment: Attending groups: Yes. Participating in groups: Yes. Taking medication as prescribed: Yes. Toleration medication:  Yes. Family/Significant other contact made: Yes, individual(s) contacted:  wife Patient understands diagnosis: Yes. Discussing patient identified problems/goals with staff: Yes. Medical problems stabilized or resolved: Yes. Denies suicidal/homicidal ideation: Yes. Issues/concerns per patient self-inventory: No. Other:   New problem(s) identified: None   New Short Term/Long Term Goal(s): medication stabilization, elimination of SI thoughts, development of comprehensive mental wellness plan.    Patient Goals:    Discharge Plan or Barriers: Patient plans to return home with his wife at discharge. He is currently declining any outpatient referrals at this time.   Reason for Continuation of Hospitalization: Aggression Depression Medication stabilization Suicidal ideation  Estimated Length of Stay: 1-2 days   Attendees: Patient: 12/16/2018   Physician: Dr. Mallie Darting, MD 12/16/2018   Nursing: Grayland Ormond, RN 12/16/2018   RN Care Manager: 12/16/2018   Social Worker: Lurline Idol, LCSW 12/16/2018   Recreational Therapist:  12/16/2018   Other:  12/16/2018   Other:  12/16/2018   Other: 12/16/2018        Scribe for Treatment Team: Joanne Chars, [place] 12/16/2018 2:23 PM
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
## 2018-12-16 NOTE — Plan of Care (Signed)
Nurse discussed anxiety, depression and coping skills with patient.  
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
## 2018-12-16 NOTE — Progress Notes (Signed)
D:  Patient's self inventory sheet, patient fair sleep, sleep medication helpful.  Good appetite, normal energy level, good concentration.  Rated depression 3, hopeless 2, hopeless 4.  Denied withdrawals.  Denied SI.  Physical problems, chest, neck.  Physical pain, worst pain #6 in past 24 hours, chest and neck.  Pain medicine is helpful.  Goal is stay positive.  Plans to not get upset or angry.  "Thank you for all your patience and understanding." Does have discharge plans. A:  Medications administered per MD orders.  Emotional support and encouragement given patient. R:  Denied SI and HI, contracts for safety.  Denied A/V hallucinations.  Safety maintained with 15 minute checks.

## 2018-12-16 NOTE — Progress Notes (Signed)
Recreation Therapy Notes  Date:  5.11.20 Time: 0930 Location: [address] Dayroom  Group Topic: Stress Management  Goal Area(s) Addresses:  Patient will identify positive stress management techniques. Patient will identify benefits of using stress management post d/c.  Intervention: Stress Management  Activity :  Meditation.  LRT introduced the stress management technique of meditation.  LRT played a meditation that focused on making the most of your day.  Patients were to listen and follow along as meditation played.  Education:  Stress Management, Discharge Planning.   Education Outcome: Acknowledges Education  Clinical Observations/Feedback:  Pt did not attend group session.    Victorino Sparrow, LRT/CTRS         Ria Comment, Elbert Spickler A 12/16/2018 11:07 AM

## 2018-12-17 ENCOUNTER — Other Ambulatory Visit: Payer: Self-pay | Admitting: Cardiothoracic Surgery

## 2018-12-17 DIAGNOSIS — W3400XA Accidental discharge from unspecified firearms or gun, initial encounter: Secondary | ICD-10-CM

## 2018-12-17 MED ORDER — TRAZODONE HCL 100 MG PO TABS
100.0000 mg | ORAL_TABLET | Freq: Every evening | ORAL | Status: DC | PRN
  Administered 2018-12-17: 21:00:00 100 mg via ORAL
  Filled 2018-12-17 (×7): qty 1

## 2018-12-17 NOTE — Progress Notes (Signed)
Pt was in dayroom upon initial approach. Pt appears with depressed mood, but was interacting with peers. Pt rated his day a "8" and he was was to work on discharge planning. Pt reports that in treatment team that he maybe discharging tomorrow. Pt denies SI/HI or hallucinations (a) 15 min checks (r) safety maintained.

## 2018-12-17 NOTE — Progress Notes (Signed)
Patient rated his day as a 7 out of a possible 10. He verbalized that today "wasn't a bad day" and that he feels well enough to be discharged. He would like to go to home tomorrow since one of the doctors informed him that he was appropriate for discharge.

## 2018-12-17 NOTE — Plan of Care (Signed)
D: Patient is alert, oriented, and cooperative. Denies SI, HI, AVH, and verbally contracts for safety. Patient reports an ok day. He complains of waking up several times during the night and pain rated 8/10 in chest.   A:Medications administered per MD order with exception of erythromycin. Support provided. Patient educated on safety on the unit and medications. Routine safety checks every 15 minutes. Patient stated understanding to tell nurse about any new physical symptoms. Patient understands to tell staff of any needs.     R: No adverse drug reactions noted. Patient verbally contracts for safety. Patient remains safe at this time and will continue to monitor.   Problem: Education: Goal: Mental status will improve Outcome: Progressing  Patient denies SI, HI, AVH, and contracts for safety. Patient remains safe and will continue to monitor.   [place] NOVEL CORONAVIRUS (COVID-19) DAILY CHECK-OFF SYMPTOMS - answer yes or no to each - every day NO YES  Have you had a fever in the past 24 hours?  Fever (Temp > 37.80C / 100F) X   Have you had any of these symptoms in the past 24 hours? New Cough  Sore Throat   Shortness of Breath  Difficulty Breathing  Unexplained Body Aches   X   Have you had any one of these symptoms in the past 24 hours not related to allergies?   Runny Nose  Nasal Congestion  Sneezing   X   If you have had runny nose, nasal congestion, sneezing in the past 24 hours, has it worsened?  X   EXPOSURES - check yes or no X   Have you traveled outside the state in the past 14 days?  X   Have you been in contact with someone with a confirmed diagnosis of COVID-19 or PUI in the past 14 days without wearing appropriate PPE?  X   Have you been living in the same home as a person with confirmed diagnosis of COVID-19 or a PUI (household contact)?    X   Have you been diagnosed with COVID-19?    X              What to do next: Answered NO to all: Answered YES to  anything:   Proceed with unit schedule Follow the [facility] Inpatient Flowsheet.

## 2018-12-17 NOTE — Progress Notes (Signed)
CSW, Psychiatry, Psych NP, and patient present for an approximately 50 minute family conference with patient's wife [name] [phone]), sister Zigmund Daniel [phone]), and mother Bethena Roys [phone]).  Patient resided with wife in [place] prior to his suicide attempt and subsequent admissions to the medical floor and [facility]. Wife is essentially not comfortable with the patient discharging home from [facility], she would prefer the patient to go to [facility] halfway housing for additional treatment to address patient's history of alcohol abuse.  The patient is not receptive to [facility] or other residential treatment. He states he has been hospitalized for greater than one month and reports he no longer has cravings to use alcohol. He denies SI and HI. His goal is to discharge home as soon as possible and he is agreeable to follow up with [facility] outpatient for continuation of care.  Patient's sister, Zigmund Daniel, is agreeable to the patient staying with her at discharge. She also resides in [place].   CSW following for disposition planning.  Stephanie Acre, LCSW-A Clinical Social Worker

## 2018-12-17 NOTE — Progress Notes (Signed)
Pt presents with a flat affect and a depressed mood. Pt rates depression 2/10, anxiety 4/10 and hopelessness 1/10. Pt denies SI/HI. Pt reported fair sleep at bedtime. Pt is hopeful that he will be able to discharge soon and return home with his wife. Pt expressed that he's uncertain if his wife will take him back. Pt verbalized that his wife hasn't seen him in 2 months. Pt compliant with taking medications and denies any side effects to medications.  Medications reviewed with pt. Verbal support provided. Pt encouraged to attend groups. 15 minute checks performed for safety.  Pt compliant with tx plan.

## 2018-12-17 NOTE — Progress Notes (Signed)
[place] NOVEL CORONAVIRUS (COVID-19) DAILY CHECK-OFF SYMPTOMS - answer yes or no to each - every day NO YES  Have you had a fever in the past 24 hours?  . Fever (Temp > 37.80C / 100F) X   Have you had any of these symptoms in the past 24 hours? . New Cough .  Sore Throat  .  Shortness of Breath .  Difficulty Breathing .  Unexplained Body Aches   X   Have you had any one of these symptoms in the past 24 hours not related to allergies?   . Runny Nose .  Nasal Congestion .  Sneezing   X   If you have had runny nose, nasal congestion, sneezing in the past 24 hours, has it worsened?  X   EXPOSURES - check yes or no X   Have you traveled outside the state in the past 14 days?  X   Have you been in contact with someone with a confirmed diagnosis of COVID-19 or PUI in the past 14 days without wearing appropriate PPE?  X   Have you been living in the same home as a person with confirmed diagnosis of COVID-19 or a PUI (household contact)?    X   Have you been diagnosed with COVID-19?    X              What to do next: Answered NO to all: Answered YES to anything:   Proceed with unit schedule Follow the [facility] Inpatient Flowsheet.   

## 2018-12-17 NOTE — Progress Notes (Signed)
Spiritual care group on grief and loss facilitated by chaplain Jerene Pitch   Group Goal:  Support / Education around grief and loss Members engage in facilitated group support and psycho social education.   Group Description:  Following introductions and group rules,  Group members engaged in facilitated group dialog and support around topic of loss, with particular support around experiences of loss in their lives. Group Identified types of loss (relationships / self / things) and identified patterns, circumstances, and changes that precipitate losses. Reflected on thoughts / feelings around loss, normalized grief responses, and recognized variety in grief experience.  Patient Progress:   Pt was invited.  Did not attend group.
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
## 2018-12-18 ENCOUNTER — Ambulatory Visit: Payer: Self-pay | Admitting: Cardiothoracic Surgery

## 2018-12-18 MED ORDER — ACAMPROSATE CALCIUM 333 MG PO TBEC
666.0000 mg | DELAYED_RELEASE_TABLET | Freq: Three times a day (TID) | ORAL | Status: DC
  Administered 2018-12-18 – 2018-12-19 (×2): 666 mg via ORAL
  Filled 2018-12-18 (×8): qty 2

## 2018-12-18 MED ORDER — TRAZODONE HCL 100 MG PO TABS: 100.0000 mg | ORAL_TABLET | Freq: Every evening | ORAL | Status: DC | PRN

## 2018-12-18 MED ORDER — TRAMADOL HCL 50 MG PO TABS
50.0000 mg | ORAL_TABLET | Freq: Three times a day (TID) | ORAL | Status: DC | PRN
  Administered 2018-12-18: 15:00:00 50 mg via ORAL
  Filled 2018-12-18: qty 1

## 2018-12-18 MED ORDER — TRAMADOL HCL 50 MG PO TABS: 50.0000 mg | ORAL_TABLET | Freq: Four times a day (QID) | ORAL | Status: DC | PRN

## 2018-12-18 MED ORDER — TRAZODONE HCL 50 MG PO TABS
50.0000 mg | ORAL_TABLET | Freq: Every evening | ORAL | Status: DC | PRN
  Administered 2018-12-18: 21:00:00 50 mg via ORAL
  Filled 2018-12-18: qty 1

## 2018-12-18 NOTE — Progress Notes (Signed)
CSW spoke with patient on unit regarding discharge tomorrow. Patient presents as calm and smiles as he expresses readiness for discharge. He denies SI/HI/AH/AH. He reports that if he feels overwhelmed and frustrated, he will reach to for help instead of acting rashly or attempting to harm himself.  He reports he will call his sister, his brother, and his wife this evening to finalize discharge plans for transportation and accommodations.   Stephanie Acre, LCSW-A Clinical Social Worker

## 2018-12-18 NOTE — [facility] Group Notes (Signed)
Occupational Therapy Group Note  Date:  12/18/2018 Time:  10:39 AM  Group Topic/Focus:  Self Esteem Action Plan:   The focus of this group is to help patients create a plan to continue to build self-esteem after discharge.  Participation Level:  Active  Participation Quality:  Redirectable  Affect:  Blunted  Cognitive:  Appropriate  Insight: Lacking  Engagement in Group:  Engaged  Modes of Intervention:  Activity, Discussion, Education and Socialization  Additional Comments:    S: "How do you deal with stupid people"  O: OT tx with focus on self esteem building this date. Education given on definition of self esteem, with both causes of low and high self esteem identified. Activity given for pt to identify a positive/aspiring trait for each letter of the alphabet. Pt to work with peers to help complete activity and build positive thinking.   A: Pt presents with blunted affect, initially sharing work story and being tangential and fixated on this topic. Pt sharing his poor work experiences, his anger outbursts, and how he lost the past 3 jobs in great detail. Pt needing redirection to self esteem topic. Pt shares that his work experiences have decreased his self esteem. Pt left with provider and not able to engage in positive discussion. Pt returning after meeting with provider to finish A-Z activity on his own time.  P: OT group will be x1 per week while pt in PHP.  Zenovia Jarred, MSOT, OTR/L Behavioral Health OT/ Acute Relief OT PHP Office: [place] 12/18/2018, 10:39 AM

## 2018-12-18 NOTE — Plan of Care (Signed)
D: Patient is alert, oriented, pleasant, and cooperative. Denies SI, HI, AVH, and verbally contracts for safety. Patient reports he is ready to go home.    A: Medications administered per MD order. Support provided. Patient educated on safety on the unit and medications. Routine safety checks every 15 minutes. Patient stated understanding to tell nurse about any new physical symptoms. Patient understands to tell staff of any needs.     R: No adverse drug reactions noted. Patient verbally contracts for safety. Patient remains safe at this time and will continue to monitor.   Problem: Education: Goal: Mental status will improve Outcome: Progressing   Patient denies SI, HI, AVH, and contracts for safety. Patient remains safe and will continue to monitor.   [place] NOVEL CORONAVIRUS (COVID-19) DAILY CHECK-OFF SYMPTOMS - answer yes or no to each - every day NO YES  Have you had a fever in the past 24 hours?  Fever (Temp > 37.80C / 100F) X   Have you had any of these symptoms in the past 24 hours? New Cough  Sore Throat   Shortness of Breath  Difficulty Breathing  Unexplained Body Aches   X   Have you had any one of these symptoms in the past 24 hours not related to allergies?   Runny Nose  Nasal Congestion  Sneezing   X   If you have had runny nose, nasal congestion, sneezing in the past 24 hours, has it worsened?  X   EXPOSURES - check yes or no X   Have you traveled outside the state in the past 14 days?  X   Have you been in contact with someone with a confirmed diagnosis of COVID-19 or PUI in the past 14 days without wearing appropriate PPE?  X   Have you been living in the same home as a person with confirmed diagnosis of COVID-19 or a PUI (household contact)?    X   Have you been diagnosed with COVID-19?    X              What to do next: Answered NO to all: Answered YES to anything:   Proceed with unit schedule Follow the [facility] Inpatient Flowsheet.

## 2018-12-18 NOTE — Progress Notes (Signed)
Patient ID: [name], male   DOB: 1961-03-29, 58 y.o.   MRN: [id number]  Nursing Progress Note [id number]  Patient presents calm, pleasant and cooperative on approach. Patient compliant with scheduled medications and denies need for PRNs. Patient is seen attending groups and visible in the milieu. Patient currently denies SI/HI/AVH. Patient still demonstrates limited insight but does appear to be remorseful of his SI attempt.  Patient is educated about and provided medication per provider's orders. Patient safety maintained with q15 min safety checks and low fall risk precautions. Emotional support given, 1:1 interaction, and active listening provided. Patient encouraged to attend meals, groups, and work on treatment plan and goals. Labs, vital signs and patient behavior monitored throughout shift.   Patient contracts for safety with staff. Patient remains safe on the unit at this time and agrees to come to staff with any issues/concerns. Patient is interacting with peers appropriately on the unit. Will continue to support and monitor.   Patient's self-inventory sheet Rated Energy Level  Normal  Rated Sleep  Poor  Rated Appetite  Fair   Rated Anxiety (0-10)  3  Rated Hopelessness (0-10)  2  Rated Depression (0-10)  4  Daily Goal  "stay positive"  Any Additional Comments:  "thank you all"    [place] NOVEL CORONAVIRUS (COVID-19) DAILY CHECK-OFF SYMPTOMS - answer yes or no to each - every day NO YES  Have you had a fever in the past 24 hours?  . Fever (Temp > 37.80C / 100F) X   Have you had any of these symptoms in the past 24 hours? . New Cough .  Sore Throat  .  Shortness of Breath .  Difficulty Breathing .  Unexplained Body Aches   X   Have you had any one of these symptoms in the past 24 hours not related to allergies?   . Runny Nose .  Nasal Congestion .  Sneezing   X   If you have had runny nose, nasal congestion, sneezing in the past 24 hours, has it worsened?  X    EXPOSURES - check yes or no X   Have you traveled outside the state in the past 14 days?  X   Have you been in contact with someone with a confirmed diagnosis of COVID-19 or PUI in the past 14 days without wearing appropriate PPE?  X   Have you been living in the same home as a person with confirmed diagnosis of COVID-19 or a PUI (household contact)?    X   Have you been diagnosed with COVID-19?    X              What to do next: Answered NO to all: Answered YES to anything:   Proceed with unit schedule Follow the [facility] Inpatient Flowsheet.
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
## 2018-12-18 NOTE — Plan of Care (Signed)
  Problem: Education: Goal: Knowledge of [facility] Education information/materials will improve Outcome: Progressing Goal: Mental status will improve Outcome: Progressing   Problem: Activity: Goal: Interest or engagement in leisure activities will improve Outcome: Progressing   Problem: Medication: Goal: Compliance with prescribed medication regimen will improve Outcome: Progressing

## 2018-12-19 MED ORDER — CARBAMAZEPINE 200 MG PO TABS: 200.0000 mg | ORAL_TABLET | Freq: Every day | ORAL | 0 refills | Status: DC

## 2018-12-19 MED ORDER — SERTRALINE HCL 100 MG PO TABS: 100.0000 mg | ORAL_TABLET | Freq: Every day | ORAL | 0 refills | Status: DC

## 2018-12-19 MED ORDER — HYDROXYZINE HCL 25 MG PO TABS: 25.0000 mg | ORAL_TABLET | Freq: Four times a day (QID) | ORAL | 0 refills | Status: DC | PRN

## 2018-12-19 MED ORDER — ACAMPROSATE CALCIUM 333 MG PO TBEC: 666.0000 mg | DELAYED_RELEASE_TABLET | Freq: Three times a day (TID) | ORAL | 0 refills | Status: DC

## 2018-12-19 MED ORDER — LEVOTHYROXINE SODIUM 175 MCG PO TABS: 175.0000 ug | ORAL_TABLET | Freq: Every day | ORAL | 0 refills | Status: DC

## 2018-12-19 MED ORDER — AMLODIPINE BESYLATE 5 MG PO TABS: 5.0000 mg | ORAL_TABLET | Freq: Every day | ORAL | 0 refills | Status: DC

## 2018-12-19 MED ORDER — TRAMADOL HCL 50 MG PO TABS: 50.0000 mg | ORAL_TABLET | Freq: Three times a day (TID) | ORAL | 0 refills | Status: DC | PRN

## 2018-12-19 NOTE — [facility] Suicide Risk Assessment (Addendum)
[facility] Discharge Suicide Risk Assessment   Principal Problem:  S/ P Suicide Attempt.  Alcohol Use Disorder Discharge Diagnoses: Active Problems:   MDD (major depressive disorder)   Total Time spent with patient: 30 minutes  Musculoskeletal: Strength & Muscle Tone: within normal limits Gait & Station: normal Patient leans: N/A  Psychiatric Specialty Exam: ROS no headache, no chest pain, no shortness of breath, no vomiting , no fever , no chills   Blood pressure 97/83, pulse 97, temperature 97.9 F (36.6 C), temperature source Oral, resp. rate 16, height 5\' 8"  (1.727 m), weight 58.1 kg, SpO2 100 %.Body mass index is 19.46 kg/m.  General Appearance: Well Groomed  Eye Contact::  Good  Speech:  Normal Rate409  Volume:  Normal  Mood:  reports improved mood and states " my mood is good today", describes it as 9/10 with 10 being best   Affect:  Appropriate and reactive, does not present irritable at this time  Thought Process:  Linear and Descriptions of Associations: Intact  Orientation:  Full (Time, Place, and Person)  Thought Content:  no hallucinations, no delusions   Suicidal Thoughts:  No denies suicidal or self injurious ideations, and states " I want to live "   Homicidal Thoughts:  No denies homicidal ideations, and specifically denies any homicidal or violent ideations towards wife , prior employer, or anyone else  Memory:  recent and remote grossly intact  Judgement:  Other:  improving   Insight:  Fair and improivng   Psychomotor Activity:  Normal  Concentration:  Good  Recall:  Good  Fund of Knowledge:Good  Language: Good  Akathisia:  Negative  Handed:  Right  AIMS (if indicated):     Assets:  Desire for Improvement Resilience  Sleep:  Number of Hours: 6.75  Cognition: WNL  ADL's:  Intact   Mental Status Per Nursing Assessment::   On Admission:  Suicidal ideation indicated by patient  Demographic Factors:  24, married, has an adult daughter, was living with wife  prior to admission, had recently quit his job   Loss Factors: Job related stressors, marital conflict, legal issues ( DUI Charge)   Historical Factors: History of depression, history of alcohol use disorder   Risk Reduction Factors:   Sense of responsibility to family, Living with another person, especially a relative, Positive social support and Positive coping skills or problem solving skills  Continued Clinical Symptoms:  At this time patient is alert, attentive, calm, describes mood as improved and denies depression, affect presents stable, reactive, and not irritable at this time, no thought disorder, no suicidal or self injurious ideations, no homicidal or violent ideations, no psychotic symptoms. Behavior on unit calm, presents cooperative on approach Denies medication side effects and states " I feel I am on the right medications right now". We reviewed side effects.   Cognitive Features That Contribute To Risk:  No gross cognitive deficits noted upon discharge. Is alert , attentive, and oriented x 3   Suicide Risk:  Mild:  Suicidal ideation of limited frequency, intensity, duration, and specificity.  There are no identifiable plans, no associated intent, mild dysphoria and related symptoms, good self-control (both objective and subjective assessment), few other risk factors, and identifiable protective factors, including available and accessible social support.  Follow-up Information    [facility] Follow up.   Why:  Your hospital discharge appointment will be held by phone on 12/25/2018 at 10:00am. Please answer the call.  Contact information: [address] [place]  [facility]/Follow-up recommendations:  Activity:  as tolerated  Diet:  regular Tests:  NA Other:  See below  Patient is expressing readiness for discharge- there are no current grounds for involuntary commitment. Plans to go live with sister for a period of  time, states he is hopeful his wife will allow him to return home soon, but in the meantime plans to stay with sister . Plans to follow up at [facility] in [place] . Encouraged to continue focusing on abstinence from alcohol and encouraged to attend AA . Patient reports he has a follow up appointment with Surgeon for follow up . States " it is next week I believe".   Jenne Campus, MD 12/19/2018, 10:24 AM

## 2018-12-19 NOTE — Progress Notes (Signed)
  [facility] Adult Case Management Discharge Plan :  Will you be returning to the same living situation after discharge:  No. Will stay with his siblings. At discharge, do you have transportation home?: Yes,  sister and brother are picking up at 11:30am. Do you have the ability to pay for your medications: No. Referred to [facility].  Release of information consent forms completed and in the chart.  Patient to Follow up at: Follow-up Information    [facility] Follow up.   Why:  Your hospital discharge appointment will be held by phone on 12/25/2018 at 10:00am. Please answer the call.  Contact information: [address] [place] [address] [phone]           Next level of care provider has access to [place] and Suicide Prevention discussed: Yes,  with wife.  Have you used any form of tobacco in the last 30 days? (Cigarettes, Smokeless Tobacco, Cigars, and/or Pipes): Yes  Has patient been referred to the Quitline?: Patient refused referral  Patient has been referred for addiction treatment: Yes  Joellen Jersey, [place] 12/19/2018, 9:30 AM

## 2018-12-19 NOTE — Progress Notes (Signed)
Patient's wife called this morning expressing great concern for her safety and Avyukt's because of a conversation she had with Tywone's sister. Wife is under the impression that Shjon is to go stay with his sister after discharge. The wife said that the sister called her under the impression that the sister was to pick Tyshaun up and drop him off at the wife's house. His wife said she is "very scared for the safety of myself and [place] and I need to talk to the doctor".

## 2018-12-19 NOTE — Progress Notes (Signed)
Patient ID: [name], male   DOB: 1961/05/31, 58 y.o.   MRN: [id number]  Nursing Progress Note [id number]  On initial approach, patient is seen sitting up in the milieu interacting with peers. Patient presents pleasant and is expressing interest in discharging. Patient compliant with scheduled medications. Patient is seen attending groups and visible in the milieu. Patient currently denies SI/HI/AVH.   Patient is educated about and provided medication per provider's orders. Patient safety maintained with q15 min safety checks and low fall risk precautions. Emotional support given, 1:1 interaction, and active listening provided. Patient encouraged to attend meals, groups, and work on treatment plan and goals. Labs, vital signs and patient behavior monitored throughout shift.   Patient contracts for safety with staff. Patient remains safe on the unit at this time and agrees to come to staff with any issues/concerns. Patient is interacting with peers appropriately on the unit. Will continue to support and monitor.  Patient's self-inventory sheet Rated Energy Level  Normal  Rated Sleep  Poor  Rated Appetite  Good  Rated Anxiety (0-10)  3  Rated Hopelessness (0-10)  2  Rated Depression (0-10)  4  Daily Goal  "stay positive, be discharged"  Any Additional Comments:     [place] NOVEL CORONAVIRUS (COVID-19) DAILY CHECK-OFF SYMPTOMS - answer yes or no to each - every day NO YES  Have you had a fever in the past 24 hours?  . Fever (Temp > 37.80C / 100F) X   Have you had any of these symptoms in the past 24 hours? . New Cough .  Sore Throat  .  Shortness of Breath .  Difficulty Breathing .  Unexplained Body Aches   X   Have you had any one of these symptoms in the past 24 hours not related to allergies?   . Runny Nose .  Nasal Congestion .  Sneezing   X   If you have had runny nose, nasal congestion, sneezing in the past 24 hours, has it worsened?  X   EXPOSURES - check yes or no  X   Have you traveled outside the state in the past 14 days?  X   Have you been in contact with someone with a confirmed diagnosis of COVID-19 or PUI in the past 14 days without wearing appropriate PPE?  X   Have you been living in the same home as a person with confirmed diagnosis of COVID-19 or a PUI (household contact)?    X   Have you been diagnosed with COVID-19?    X              What to do next: Answered NO to all: Answered YES to anything:   Proceed with unit schedule Follow the [facility] Inpatient Flowsheet.

## 2018-12-19 NOTE — Progress Notes (Signed)
Pt said he would rate his day as a 7. The one positive thing that happen to him today he goes home tomorrow.
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
## 2018-12-19 NOTE — Progress Notes (Signed)
Patient ID: [name], male   DOB: Dec 08, 1960, 58 y.o.   MRN: [id number]  Discharge Note  Patient denies SI/HI and states readiness for discharge.  Written and verbal discharge instructions reviewed with the patient. Patient accepting to information and verbalized understanding with no concerns. All belongings returned to patient from the unit and secured lockers. Patient has completed their Suicide Safety Plan and has been provided Suicide Prevention resources. Patient provided an opportunity to complete and return Patient Satisfaction Survey.   Patient was safely escorted to the lobby for discharge. Patient discharged from [facility] with prescriptions sent to pharmacy, personal belongings, follow-up appointment in place and discharge paperwork.

## 2018-12-20 ENCOUNTER — Encounter (HOSPITAL_COMMUNITY): Payer: Self-pay | Admitting: Psychiatry

## 2018-12-25 LAB — ACID FAST CULTURE WITH REFLEXED SENSITIVITIES (MYCOBACTERIA): Acid Fast Culture: NEGATIVE

## 2019-01-31 ENCOUNTER — Ambulatory Visit (INDEPENDENT_AMBULATORY_CARE_PROVIDER_SITE_OTHER): Payer: Self-pay | Admitting: Cardiothoracic Surgery

## 2019-01-31 ENCOUNTER — Other Ambulatory Visit: Payer: Self-pay

## 2019-01-31 ENCOUNTER — Encounter: Payer: Self-pay | Admitting: Cardiothoracic Surgery

## 2019-01-31 ENCOUNTER — Ambulatory Visit
Admission: RE | Admit: 2019-01-31 | Discharge: 2019-01-31 | Disposition: A | Discharge status: Home / Self Care | Payer: Self-pay | Source: Ambulatory Visit | Attending: Cardiothoracic Surgery | Admitting: Cardiothoracic Surgery

## 2019-01-31 VITALS — BP 155/86 | HR 63 | Temp 97.7°F | Resp 18 | Ht 68.0 in | Wt 150.4 lb

## 2019-01-31 DIAGNOSIS — W3400XA Accidental discharge from unspecified firearms or gun, initial encounter: Secondary | ICD-10-CM

## 2019-01-31 DIAGNOSIS — Z09 Encounter for follow-up examination after completed treatment for conditions other than malignant neoplasm: Secondary | ICD-10-CM
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
## 2019-02-12 ENCOUNTER — Ambulatory Visit: Payer: Self-pay | Admitting: Cardiothoracic Surgery

## 2019-05-04 ENCOUNTER — Other Ambulatory Visit: Payer: Self-pay

## 2019-05-04 ENCOUNTER — Encounter (HOSPITAL_COMMUNITY): Payer: Self-pay | Admitting: Emergency Medicine

## 2019-05-04 ENCOUNTER — Emergency Department (HOSPITAL_COMMUNITY)
Admission: EM | Admit: 2019-05-04 | Discharge: 2019-05-06 | Disposition: A | Discharge status: Other Acute Inpatient Hospital | Payer: Medicaid Other | Attending: Emergency Medicine | Admitting: Emergency Medicine

## 2019-05-04 DIAGNOSIS — Z79899 Other long term (current) drug therapy: Secondary | ICD-10-CM | POA: Diagnosis not present

## 2019-05-04 DIAGNOSIS — F17210 Nicotine dependence, cigarettes, uncomplicated: Secondary | ICD-10-CM | POA: Insufficient documentation

## 2019-05-04 DIAGNOSIS — Z046 Encounter for general psychiatric examination, requested by authority: Secondary | ICD-10-CM | POA: Diagnosis present

## 2019-05-04 DIAGNOSIS — I10 Essential (primary) hypertension: Secondary | ICD-10-CM | POA: Insufficient documentation

## 2019-05-04 DIAGNOSIS — R454 Irritability and anger: Secondary | ICD-10-CM

## 2019-05-04 DIAGNOSIS — Z20828 Contact with and (suspected) exposure to other viral communicable diseases: Secondary | ICD-10-CM | POA: Insufficient documentation

## 2019-05-04 DIAGNOSIS — E039 Hypothyroidism, unspecified: Secondary | ICD-10-CM | POA: Diagnosis not present

## 2019-05-04 DIAGNOSIS — F332 Major depressive disorder, recurrent severe without psychotic features: Secondary | ICD-10-CM | POA: Insufficient documentation

## 2019-05-04 HISTORY — DX: Intentional self-harm by other specified means, initial encounter: X83.8XXA

## 2019-05-04 LAB — RAPID URINE DRUG SCREEN, HOSP PERFORMED
Amphetamines: NOT DETECTED
Barbiturates: NOT DETECTED
Benzodiazepines: NOT DETECTED
Cocaine: NOT DETECTED
Opiates: NOT DETECTED
Tetrahydrocannabinol: POSITIVE — AB

## 2019-05-04 LAB — COMPREHENSIVE METABOLIC PANEL
ALT: 11 U/L (ref 0–44)
AST: 14 U/L — ABNORMAL LOW (ref 15–41)
Albumin: 4.1 g/dL (ref 3.5–5.0)
Alkaline Phosphatase: 96 U/L (ref 38–126)
Anion gap: 9 (ref 5–15)
BUN: 21 mg/dL — ABNORMAL HIGH (ref 6–20)
CO2: 24 mmol/L (ref 22–32)
Calcium: 9.5 mg/dL (ref 8.9–10.3)
Chloride: 103 mmol/L (ref 98–111)
Creatinine, Ser: 0.97 mg/dL (ref 0.61–1.24)
GFR calc Af Amer: >60 mL/min (ref >60)
GFR calc non Af Amer: >60 mL/min (ref >60)
Glucose, Bld: 101 mg/dL — ABNORMAL HIGH (ref 70–99)
Potassium: 3.8 mmol/L (ref 3.5–5.1)
Sodium: 136 mmol/L (ref 135–145)
Total Bilirubin: 0.8 mg/dL (ref 0.3–1.2)
Total Protein: 7.2 g/dL (ref 6.5–8.1)

## 2019-05-04 LAB — CBC
HCT: 41.4 % (ref 39.0–52.0)
Hemoglobin: 14.2 g/dL (ref 13.0–17.0)
MCH: 32.3 pg (ref 26.0–34.0)
MCHC: 34.3 g/dL (ref 30.0–36.0)
MCV: 94.3 fL (ref 80.0–100.0)
Platelets: 202 10*3/uL (ref 150–400)
RBC: 4.39 MIL/uL (ref 4.22–5.81)
RDW: 13 % (ref 11.5–15.5)
WBC: 7.6 10*3/uL (ref 4.0–10.5)
nRBC: 0 % (ref 0.0–0.2)

## 2019-05-04 LAB — SALICYLATE LEVEL: Salicylate Lvl: <7 mg/dL (ref 2.8–30.0)

## 2019-05-04 LAB — ACETAMINOPHEN LEVEL: Acetaminophen (Tylenol), Serum: <10 ug/mL — ABNORMAL LOW (ref 10–30)

## 2019-05-04 LAB — ETHANOL: Alcohol, Ethyl (B): <10 mg/dL (ref <10)

## 2019-05-04 MED ORDER — HYDROXYZINE HCL 25 MG PO TABS
25.0000 mg | ORAL_TABLET | Freq: Four times a day (QID) | ORAL | Status: DC | PRN
  Administered 2019-05-04: 25 mg via ORAL
  Filled 2019-05-04: qty 1

## 2019-05-04 MED ORDER — ACAMPROSATE CALCIUM 333 MG PO TBEC
666.0000 mg | DELAYED_RELEASE_TABLET | Freq: Three times a day (TID) | ORAL | Status: DC
  Administered 2019-05-05 – 2019-05-06 (×4): 666 mg via ORAL
  Filled 2019-05-04 (×9): qty 2

## 2019-05-04 MED ORDER — HYDROCHLOROTHIAZIDE 25 MG PO TABS
25.0000 mg | ORAL_TABLET | Freq: Every day | ORAL | Status: DC
  Administered 2019-05-04 – 2019-05-06 (×3): 25 mg via ORAL
  Filled 2019-05-04 (×3): qty 1

## 2019-05-04 MED ORDER — AMLODIPINE BESYLATE 5 MG PO TABS
5.0000 mg | ORAL_TABLET | Freq: Every day | ORAL | Status: DC
  Administered 2019-05-04 – 2019-05-06 (×3): 5 mg via ORAL
  Filled 2019-05-04 (×3): qty 1

## 2019-05-04 MED ORDER — CARBAMAZEPINE 200 MG PO TABS
200.0000 mg | ORAL_TABLET | Freq: Every day | ORAL | Status: DC
  Administered 2019-05-04 (×2): 200 mg via ORAL
  Filled 2019-05-04 (×2): qty 1

## 2019-05-04 MED ORDER — TRAMADOL HCL 50 MG PO TABS
50.0000 mg | ORAL_TABLET | Freq: Once | ORAL | Status: AC
  Administered 2019-05-04: 50 mg via ORAL
  Filled 2019-05-04: qty 1

## 2019-05-04 MED ORDER — LEVOTHYROXINE SODIUM 50 MCG PO TABS
175.0000 ug | ORAL_TABLET | Freq: Every day | ORAL | Status: DC
  Administered 2019-05-05 – 2019-05-06 (×2): 175 ug via ORAL
  Filled 2019-05-04 (×2): qty 4

## 2019-05-04 MED ORDER — SERTRALINE HCL 50 MG PO TABS
100.0000 mg | ORAL_TABLET | Freq: Every day | ORAL | Status: DC
  Administered 2019-05-04 – 2019-05-06 (×3): 100 mg via ORAL
  Filled 2019-05-04 (×3): qty 2

## 2019-05-04 NOTE — ED Notes (Signed)
Pt reports that the ER is so noisy that he cannot sleep   He reports a painful R shoulder as well as requesting something for sleep and pain

## 2019-05-04 NOTE — ED Notes (Signed)
Awaiting placement
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
## 2019-05-04 NOTE — ED Triage Notes (Signed)
Pt states he is here to commit himself. States he is not suicidal as he tried that a few months ago and it did not work, not homicidal, not hearing voices or seeing things. States he just hates being around people. Goes to [facility] and last talked to them Monday.

## 2019-05-04 NOTE — Progress Notes (Signed)
Patient meets criteria for inpatient treatment. No appropriate or available beds at [facility]. CSW faxed referrals to the following facilities for review:  [facility]   TTS will continue to seek bed placement.  Chalmers Guest. Guerry Bruin, MSW, [place] Work/Disposition Phone: [phone] Fax: [phone]

## 2019-05-04 NOTE — ED Notes (Signed)
Pt reports he thought about suicide until he tried it and it didn't work  Here because he cannot stand to be around people   Followed by [facility] seen last Monday and "they must not have thought anything was wrong"
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
## 2019-05-05 LAB — SARS CORONAVIRUS 2 (TAT 6-24 HRS): SARS Coronavirus 2: NEGATIVE

## 2019-05-05 MED ORDER — STERILE WATER FOR INJECTION IJ SOLN
INTRAMUSCULAR | Status: AC
  Administered 2019-05-05: 20:00:00 1.2 mL
  Filled 2019-05-05: qty 10

## 2019-05-05 MED ORDER — ZIPRASIDONE MESYLATE 20 MG IM SOLR
20.0000 mg | Freq: Once | INTRAMUSCULAR | Status: AC
  Administered 2019-05-05: 20 mg via INTRAMUSCULAR
  Filled 2019-05-05: qty 20
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
## 2019-05-05 NOTE — Progress Notes (Addendum)
Pt meets inpatient criteria per Ricky Ala, NP. Referral information has been sent to the following hospitals for review:   [facility]  [facility]  [facility]  [facility]  [facility]  Disposition will continue to follow for inpatient placement needs.   Audree Camel, LCSW, [place] Disposition [facility]/TTS [phone]
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
## 2019-05-05 NOTE — ED Notes (Signed)
RPD talking with pt, pt upset about being in a hallway bed and not a room, pt is anxious and requests anxiety meds

## 2019-05-06 ENCOUNTER — Encounter (HOSPITAL_COMMUNITY): Payer: Self-pay | Admitting: Psychiatry

## 2019-05-06 ENCOUNTER — Inpatient Hospital Stay (HOSPITAL_COMMUNITY)
Admission: AD | Admit: 2019-05-06 | Discharge: 2019-05-12 | DRG: 885 | Disposition: A | Discharge status: Home / Self Care | Payer: Medicaid Other | Source: Intra-hospital | Attending: Psychiatry | Admitting: Psychiatry

## 2019-05-06 DIAGNOSIS — G4700 Insomnia, unspecified: Secondary | ICD-10-CM | POA: Diagnosis present

## 2019-05-06 DIAGNOSIS — E039 Hypothyroidism, unspecified: Secondary | ICD-10-CM | POA: Diagnosis present

## 2019-05-06 DIAGNOSIS — Z560 Unemployment, unspecified: Secondary | ICD-10-CM | POA: Diagnosis not present

## 2019-05-06 DIAGNOSIS — Z79890 Hormone replacement therapy (postmenopausal): Secondary | ICD-10-CM | POA: Diagnosis not present

## 2019-05-06 DIAGNOSIS — Z888 Allergy status to other drugs, medicaments and biological substances status: Secondary | ICD-10-CM

## 2019-05-06 DIAGNOSIS — R45850 Homicidal ideations: Secondary | ICD-10-CM | POA: Diagnosis present

## 2019-05-06 DIAGNOSIS — Z638 Other specified problems related to primary support group: Secondary | ICD-10-CM | POA: Diagnosis not present

## 2019-05-06 DIAGNOSIS — F1210 Cannabis abuse, uncomplicated: Secondary | ICD-10-CM | POA: Diagnosis present

## 2019-05-06 DIAGNOSIS — Z8249 Family history of ischemic heart disease and other diseases of the circulatory system: Secondary | ICD-10-CM

## 2019-05-06 DIAGNOSIS — Z91120 Patient's intentional underdosing of medication regimen due to financial hardship: Secondary | ICD-10-CM | POA: Diagnosis not present

## 2019-05-06 DIAGNOSIS — G4733 Obstructive sleep apnea (adult) (pediatric): Secondary | ICD-10-CM | POA: Diagnosis present

## 2019-05-06 DIAGNOSIS — F1021 Alcohol dependence, in remission: Secondary | ICD-10-CM | POA: Diagnosis present

## 2019-05-06 DIAGNOSIS — Z79899 Other long term (current) drug therapy: Secondary | ICD-10-CM | POA: Diagnosis not present

## 2019-05-06 DIAGNOSIS — Z653 Problems related to other legal circumstances: Secondary | ICD-10-CM

## 2019-05-06 DIAGNOSIS — F17210 Nicotine dependence, cigarettes, uncomplicated: Secondary | ICD-10-CM | POA: Diagnosis present

## 2019-05-06 DIAGNOSIS — F332 Major depressive disorder, recurrent severe without psychotic features: Secondary | ICD-10-CM | POA: Diagnosis present

## 2019-05-06 DIAGNOSIS — F419 Anxiety disorder, unspecified: Secondary | ICD-10-CM | POA: Diagnosis present

## 2019-05-06 DIAGNOSIS — Z820 Family history of epilepsy and other diseases of the nervous system: Secondary | ICD-10-CM | POA: Diagnosis not present

## 2019-05-06 DIAGNOSIS — T50916A Underdosing of multiple unspecified drugs, medicaments and biological substances, initial encounter: Secondary | ICD-10-CM | POA: Diagnosis present

## 2019-05-06 DIAGNOSIS — Z915 Personal history of self-harm: Secondary | ICD-10-CM

## 2019-05-06 DIAGNOSIS — F39 Unspecified mood [affective] disorder: Secondary | ICD-10-CM | POA: Diagnosis present

## 2019-05-06 DIAGNOSIS — I10 Essential (primary) hypertension: Secondary | ICD-10-CM | POA: Diagnosis present

## 2019-05-06 MED ORDER — ACETAMINOPHEN 325 MG PO TABS
650.0000 mg | ORAL_TABLET | Freq: Once | ORAL | Status: AC
  Administered 2019-05-06: 650 mg via ORAL
  Filled 2019-05-06: qty 2

## 2019-05-06 MED ORDER — ACETAMINOPHEN 325 MG PO TABS
ORAL_TABLET | ORAL | Status: AC
  Filled 2019-05-06: qty 2

## 2019-05-06 MED ORDER — ACETAMINOPHEN 325 MG PO TABS
650.0000 mg | ORAL_TABLET | Freq: Four times a day (QID) | ORAL | Status: DC | PRN
  Administered 2019-05-06 – 2019-05-11 (×7): 650 mg via ORAL
  Filled 2019-05-06 (×6): qty 2

## 2019-05-06 MED ORDER — ALUM & MAG HYDROXIDE-SIMETH 200-200-20 MG/5ML PO SUSP: 30.0000 mL | ORAL | Status: DC | PRN

## 2019-05-06 MED ORDER — MAGNESIUM HYDROXIDE 400 MG/5ML PO SUSP: 30.0000 mL | Freq: Every day | ORAL | Status: DC | PRN

## 2019-05-06 NOTE — Progress Notes (Signed)
Psychoeducational Group Note  Date:  05/06/2019 Time:  2225  Group Topic/Focus:  Wrap-Up Group:   The focus of this group is to help patients review their daily goal of treatment and discuss progress on daily workbooks.  Participation Level: Did Not Attend  Participation Quality:  Not Applicable  Affect:  Not Applicable  Cognitive:  Not Applicable  Insight:  Not Applicable  Engagement in Group: Not Applicable  Additional Comments:  The patient did not attend group since he was not admitted to the hallway until later in the evening.   Archie Balboa S 05/06/2019, 10:25 PM

## 2019-05-06 NOTE — ED Notes (Signed)
Ava at [facility] called to say that Pt has been accepted at [facility] by Dr. Parke Poisson and may arrive after 7:30 pm.  Nurse informed.

## 2019-05-06 NOTE — ED Notes (Signed)
Report given to [facility] RN 

## 2019-05-06 NOTE — Care Management (Signed)
Accepted to [facility] Bed 33-2 Accepting is Dr. Parke Poisson  [phone] is the number to give report  The patient can come at 7:30pm  Writer informed the nurse working with the patient.

## 2019-05-07 ENCOUNTER — Other Ambulatory Visit: Payer: Self-pay

## 2019-05-07 ENCOUNTER — Encounter (HOSPITAL_COMMUNITY): Payer: Self-pay | Admitting: Psychiatry

## 2019-05-07 DIAGNOSIS — F332 Major depressive disorder, recurrent severe without psychotic features: Secondary | ICD-10-CM | POA: Diagnosis not present

## 2019-05-07 MED ORDER — HYDROXYZINE HCL 25 MG PO TABS
25.0000 mg | ORAL_TABLET | Freq: Three times a day (TID) | ORAL | Status: DC | PRN
  Administered 2019-05-08 – 2019-05-11 (×5): 25 mg via ORAL
  Filled 2019-05-07 (×5): qty 1
  Filled 2019-05-07: qty 10

## 2019-05-07 MED ORDER — LEVOTHYROXINE SODIUM 175 MCG PO TABS
175.0000 ug | ORAL_TABLET | Freq: Every day | ORAL | Status: DC
  Administered 2019-05-07 – 2019-05-12 (×6): 175 ug via ORAL
  Filled 2019-05-07 (×8): qty 1

## 2019-05-07 MED ORDER — SERTRALINE HCL 50 MG PO TABS
50.0000 mg | ORAL_TABLET | Freq: Every day | ORAL | Status: DC
  Administered 2019-05-07 – 2019-05-11 (×5): 50 mg via ORAL
  Filled 2019-05-07 (×8): qty 1

## 2019-05-07 MED ORDER — AMLODIPINE BESYLATE 5 MG PO TABS
5.0000 mg | ORAL_TABLET | Freq: Every day | ORAL | Status: DC
  Administered 2019-05-07 – 2019-05-12 (×6): 5 mg via ORAL
  Filled 2019-05-07 (×3): qty 1
  Filled 2019-05-07: qty 7
  Filled 2019-05-07 (×7): qty 1

## 2019-05-07 MED ORDER — SERTRALINE HCL 25 MG PO TABS: 25.0000 mg | ORAL_TABLET | Freq: Every day | ORAL | Status: DC

## 2019-05-07 MED ORDER — HYDROCHLOROTHIAZIDE 25 MG PO TABS
25.0000 mg | ORAL_TABLET | Freq: Every day | ORAL | Status: DC
  Administered 2019-05-07 – 2019-05-12 (×6): 25 mg via ORAL
  Filled 2019-05-07 (×11): qty 1

## 2019-05-07 MED ORDER — CARBAMAZEPINE 200 MG PO TABS
200.0000 mg | ORAL_TABLET | Freq: Two times a day (BID) | ORAL | Status: DC
  Administered 2019-05-07 – 2019-05-12 (×10): 200 mg via ORAL
  Filled 2019-05-07: qty 14
  Filled 2019-05-07 (×9): qty 1
  Filled 2019-05-07: qty 14
  Filled 2019-05-07 (×4): qty 1

## 2019-05-07 MED ORDER — TRAZODONE HCL 50 MG PO TABS
50.0000 mg | ORAL_TABLET | Freq: Every evening | ORAL | Status: DC | PRN
  Administered 2019-05-07 – 2019-05-08 (×4): 50 mg via ORAL
  Filled 2019-05-07 (×4): qty 1
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
## 2019-05-07 NOTE — Progress Notes (Signed)
The patient had nothing to share about his day nor did he share anything that he wanted to share. His goal for tomorrow is to wake up in the morning.

## 2019-05-07 NOTE — Plan of Care (Signed)
Nurse discussed anxiety, depression and coping skills with patient.  

## 2019-05-07 NOTE — Tx Team (Signed)
Initial Treatment Plan 05/07/2019 12:48 AM [name] [id number]    PATIENT STRESSORS: Financial difficulties Marital or family conflict Substance abuse   PATIENT STRENGTHS: Ability for insight Average or above average intelligence Communication skills Motivation for treatment/growth   PATIENT IDENTIFIED PROBLEMS: Depression/suicidal thoughts/Homicidal thoughts  Anger/irritability                   DISCHARGE CRITERIA:  Ability to meet basic life and health needs Adequate post-discharge living arrangements Improved stabilization in mood, thinking, and/or behavior Motivation to continue treatment in a less acute level of care  PRELIMINARY DISCHARGE PLAN: Outpatient therapy Participate in family therapy Return to previous living arrangement  PATIENT/FAMILY INVOLVEMENT: This treatment plan has been presented to and reviewed with the patient, [name].  The patient has been given the opportunity to ask questions and make suggestions.  Ronelle Nigh, RN 05/07/2019, 12:48 AM

## 2019-05-07 NOTE — Plan of Care (Signed)
Newly admitted and adjusting well. Patient stay in the milieu for a little while then went to bed. Patient is insisting that he would like not to have a roommate.
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
## 2019-05-07 NOTE — Tx Team (Signed)
Interdisciplinary Treatment and Diagnostic Plan Update  05/07/2019 Time of Session: 9:15am [name] MRN: [id number]  Principal Diagnosis: <principal problem not specified>  Secondary Diagnoses: Active Problems:   MDD (major depressive disorder), recurrent episode, severe (HCC)   Current Medications:  Current Facility-Administered Medications  Medication Dose Route Frequency Provider Last Rate Last Dose  . acetaminophen (TYLENOL) 325 MG tablet           . acetaminophen (TYLENOL) tablet 650 mg  650 mg Oral Q6H PRN Emmaline Kluver, FNP   650 mg at 05/07/19 0829  . alum & mag hydroxide-simeth (MAALOX/MYLANTA) 200-200-20 MG/5ML suspension 30 mL  30 mL Oral Q4H PRN Emmaline Kluver, FNP      . amLODipine (NORVASC) tablet 5 mg  5 mg Oral Daily Connye Burkitt, NP      . hydrochlorothiazide (HYDRODIURIL) tablet 25 mg  25 mg Oral Daily Connye Burkitt, NP      . hydrOXYzine (ATARAX/VISTARIL) tablet 25 mg  25 mg Oral TID PRN Connye Burkitt, NP      . levothyroxine (SYNTHROID) tablet 175 mcg  175 mcg Oral Q0600 Connye Burkitt, NP      . magnesium hydroxide (MILK OF MAGNESIA) suspension 30 mL  30 mL Oral Daily PRN Emmaline Kluver, FNP      . traZODone (DESYREL) tablet 50 mg  50 mg Oral QHS PRN,MR X 1 Connye Burkitt, NP       Facility-Administered Medications Ordered in Other Encounters  Medication Dose Route Frequency Provider Last Rate Last Dose  . gadopentetate dimeglumine (MAGNEVIST) injection 15 mL  15 mL Intravenous Once PRN Sater, Nanine Means, MD       PTA Medications: Medications Prior to Admission  Medication Sig Dispense Refill Last Dose  . acamprosate (CAMPRAL) 333 MG tablet Take 2 tablets (666 mg total) by mouth 3 (three) times daily with meals. 180 tablet 0   . amLODipine (NORVASC) 5 MG tablet Take 1 tablet (5 mg total) by mouth daily. 30 tablet 0   . carbamazepine (TEGRETOL) 200 MG tablet Take 1 tablet (200 mg total) by mouth at bedtime. 30 tablet 0   . hydrochlorothiazide (HYDRODIURIL)  25 MG tablet Take 25 mg by mouth.     . hydrOXYzine (ATARAX/VISTARIL) 25 MG tablet Take 1 tablet (25 mg total) by mouth every 6 (six) hours as needed for anxiety. 30 tablet 0   . levothyroxine (SYNTHROID) 175 MCG tablet Take 1 tablet (175 mcg total) by mouth daily at 6 (six) AM. 30 tablet 0   . olmesartan (BENICAR) 40 MG tablet Take 40 mg by mouth daily.     . sertraline (ZOLOFT) 100 MG tablet Take 1 tablet (100 mg total) by mouth daily. 30 tablet 0     Patient Stressors: Financial difficulties Marital or family conflict Substance abuse  Patient Strengths: Ability for insight Average or above average Architect for treatment/growth  Treatment Modalities: Medication Management, Group therapy, Case management,  1 to 1 session with clinician, Psychoeducation, Recreational therapy.   Physician Treatment Plan for Primary Diagnosis: <principal problem not specified> Long Term Goal(s):     Short Term Goals:    Medication Management: Evaluate patient's response, side effects, and tolerance of medication regimen.  Therapeutic Interventions: 1 to 1 sessions, Unit Group sessions and Medication administration.  Evaluation of Outcomes: Not Met  Physician Treatment Plan for Secondary Diagnosis: Active Problems:   MDD (major depressive disorder), recurrent episode, severe ([place])  Long  Term Goal(s):     Short Term Goals:       Medication Management: Evaluate patient's response, side effects, and tolerance of medication regimen.  Therapeutic Interventions: 1 to 1 sessions, Unit Group sessions and Medication administration.  Evaluation of Outcomes: Not Met   RN Treatment Plan for Primary Diagnosis: <principal problem not specified> Long Term Goal(s): Knowledge of disease and therapeutic regimen to maintain health will improve  Short Term Goals: Ability to verbalize frustration and anger appropriately will improve, Ability to demonstrate self-control,  Ability to participate in decision making will improve, Ability to disclose and discuss suicidal ideas, Ability to identify and develop effective coping behaviors will improve and Compliance with prescribed medications will improve  Medication Management: RN will administer medications as ordered by provider, will assess and evaluate patient's response and provide education to patient for prescribed medication. RN will report any adverse and/or side effects to prescribing provider.  Therapeutic Interventions: 1 on 1 counseling sessions, Psychoeducation, Medication administration, Evaluate responses to treatment, Monitor vital signs and CBGs as ordered, Perform/monitor CIWA, COWS, AIMS and Fall Risk screenings as ordered, Perform wound care treatments as ordered.  Evaluation of Outcomes: Not Met   LCSW Treatment Plan for Primary Diagnosis: <principal problem not specified> Long Term Goal(s): Safe transition to appropriate next level of care at discharge, Engage patient in therapeutic group addressing interpersonal concerns.  Short Term Goals: Engage patient in aftercare planning with referrals and resources  Therapeutic Interventions: Assess for all discharge needs, 1 to 1 time with Social worker, Explore available resources and support systems, Assess for adequacy in community support network, Educate family and significant other(s) on suicide prevention, Complete Psychosocial Assessment, Interpersonal group therapy.  Evaluation of Outcomes: Not Met   Progress in Treatment: Attending groups: No. New to unit  Participating in groups: No. Taking medication as prescribed: Yes. Toleration medication: Yes. Family/Significant other contact made: No, will contact:  if patient consents to collateral contacts Patient understands diagnosis: Yes. Discussing patient identified problems/goals with staff: Yes. Medical problems stabilized or resolved: Yes. Denies suicidal/homicidal ideation: No. Patient  continues to endorse HI and SI Issues/concerns per patient self-inventory: No. Other:   New problem(s) identified: None   New Short Term/Long Term Goal(s): medication stabilization, elimination of SI thoughts, development of comprehensive mental wellness plan.    Patient Goals: "To get my mind together and I need to get my medications figured out"     Discharge Plan or Barriers: Patient recently admitted. CSW will continue to follow and assess for appropriate referrals and possible discharge planning.    Reason for Continuation of Hospitalization: Aggression Anxiety Depression Homicidal ideation Medication stabilization Suicidal ideation  Estimated Length of Stay: 3-5 days   Attendees: Patient: [name] 05/07/2019 9:51 AM  Physician: Dr. Neita Garnet, MD 05/07/2019 9:51 AM  Nursing: Rise Paganini.Raliegh Ip, RN 05/07/2019 9:51 AM  RN Care Manager: 05/07/2019 9:51 AM  Social Worker: Radonna Ricker, LCSW 05/07/2019 9:51 AM  Recreational Therapist:  05/07/2019 9:51 AM  Other:  05/07/2019 9:51 AM  Other:  05/07/2019 9:51 AM  Other: 05/07/2019 9:51 AM    Scribe for Treatment Team: Marylee Floras, [place] 05/07/2019 9:51 AM

## 2019-05-08 DIAGNOSIS — F419 Anxiety disorder, unspecified: Secondary | ICD-10-CM

## 2019-05-08 DIAGNOSIS — F332 Major depressive disorder, recurrent severe without psychotic features: Secondary | ICD-10-CM | POA: Diagnosis not present

## 2019-05-08 DIAGNOSIS — G4700 Insomnia, unspecified: Secondary | ICD-10-CM | POA: Diagnosis not present

## 2019-05-08 LAB — TSH: TSH: 17.705 u[IU]/mL — ABNORMAL HIGH (ref 0.350–4.500)

## 2019-05-08 NOTE — [facility] Group Notes (Signed)
[place] Group Notes:  (Nursing/MHT/Case Management/Adjunct)  Date:  05/08/2019  Time:  10:00 AM  Type of Therapy:  Nurse Education  Participation Level:  Minimal  Participation Quality:  Appropriate and Attentive  Affect:  Appropriate  Cognitive:  Alert and Appropriate  Insight:  Appropriate  Engagement in Group:  Engaged, Limited and Supportive  Modes of Intervention:  Discussion, Education, Exploration, Socialization and Support  Summary of Progress/Problems: pt's discussed crisis management and how this related to past/current crises they were/are facing. Pt's shared their resources to help others realize coping skills, support systems and resources that could be utilized. Pt's heavily focused on results of Covid and how these had impacted their routine. Pt came to group and was attentive. Pt shared minimally but when they did, the subject was appropriate and congruent. Pt discussed nicotine as a current stressor that could help them through crisis while inpatient.  [name] 05/08/2019, 11:34 AM
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
## 2019-05-08 NOTE — Plan of Care (Signed)
  Problem: Education: °Goal: Ability to make informed decisions regarding treatment will improve °Outcome: Progressing °  °Problem: Coping: °Goal: Coping ability will improve °Outcome: Progressing °  °

## 2019-05-08 NOTE — [facility] Group Notes (Signed)
LCSW Aftercare Discharge Planning Group Note  05/08/2019   Type of Group and Topic: Psychoeducational Group: Discharge Planning  Participation Level: Active  Description of Group  Discharge planning group reviews patient's anticipated discharge plans and assists patients to anticipate and address any barriers to wellness/recovery in the community. Suicide prevention education is reviewed with patients in group.  Therapeutic Goals  1. Patients will state their anticipated discharge plan and mental health aftercare  2. Patients will identify potential barriers to wellness in the community setting  3. Patients will engage in problem solving, solution focused discussion of ways to anticipate and address barriers to wellness/recovery  Summary of Patient Progress  Plan for Discharge/Comments: Patient identified working on the aspects of financial and social wellness into their recovery.  Supports: Wife, sister.  Therapeutic Modalities:  [place], [place]  05/08/2019 3:03 PM

## 2019-05-08 NOTE — Progress Notes (Signed)
Patient ID: SANJIV PETIT, male   DOB: 06/10/1961, 58 y.o.   MRN: [id number]  [place] NOVEL CORONAVIRUS (COVID-19) DAILY CHECK-OFF SYMPTOMS - answer yes or no to each - every day NO YES  Have you had a fever in the past 24 hours?  . Fever (Temp > 37.80C / 100F) X   Have you had any of these symptoms in the past 24 hours? . New Cough .  Sore Throat  .  Shortness of Breath .  Difficulty Breathing .  Unexplained Body Aches   X   Have you had any one of these symptoms in the past 24 hours not related to allergies?   . Runny Nose .  Nasal Congestion .  Sneezing   X   If you have had runny nose, nasal congestion, sneezing in the past 24 hours, has it worsened?  X   EXPOSURES - check yes or no X   Have you traveled outside the state in the past 14 days?  X   Have you been in contact with someone with a confirmed diagnosis of COVID-19 or PUI in the past 14 days without wearing appropriate PPE?  X   Have you been living in the same home as a person with confirmed diagnosis of COVID-19 or a PUI (household contact)?    X   Have you been diagnosed with COVID-19?    X              What to do next: Answered NO to all: Answered YES to anything:   Proceed with unit schedule Follow the [facility] Inpatient Flowsheet.

## 2019-05-08 NOTE — Progress Notes (Signed)
Adult Psychoeducational Group Note  Date:  05/08/2019 Time:  9:17 PM  Group Topic/Focus:  Wrap-Up Group:   The focus of this group is to help patients review their daily goal of treatment and discuss progress on daily workbooks.  Participation Level:  Active  Participation Quality:  Appropriate  Affect:  Appropriate  Cognitive:  Appropriate  Insight: Appropriate  Engagement in Group:  Engaged  Modes of Intervention:  Discussion  Additional Comments:  Patient attended group and participated.   Kariss Longmire W Dayra Rapley [id number], 9:17 PM

## 2019-05-08 NOTE — Progress Notes (Addendum)
[facility] MD Progress Note  05/08/2019 1:43 PM [name]  MRN:  [id number]  Subjective: [name] reports, "I'm actually doing well today. My mood is good all day because I'm not judged in here. I can relate to most of the patients in here because all of [place] are in need of emotional & mental balances   Objective: 58 year old, married, has one adult daughter. Currently living with his sister. Currently unemployed. Presented to ED voluntarily on 9/27. Stressors include wife not allowing him to return home yet, having upcoming court dates for history of several DUIs, strained relationships with the people he is living with (he is living with sister and other members of extended family, which he states tend to be disrespectful to his sister and not pick after themselves). States  "there were just too many people living in a small  house, it was too much". Reports he has been feeling irritable, intolerant" of people's drama and B.S. "Reports also has had some depression, but states less severe than in the past. 05-08-19, [name] is seen, chart reviewed. The chart findings discussed with the treatment team. He presents alert, oriented & aware of situation. He is visible on the unit, attending group sessions. He says he is doing well today & his mood is good. However, he continues to ruminate about his wife not wanting him to return home because he is not contributing much to run the household. He blamed that his wife only wants him for he can give her. He says he is also worried about his pending court cases, but hopeful that the social will try to reach out to them on his behalf. He currently denies any SIHI, AVH, delusional thoughts or paranoia. He does not appear to be responding to any internal stimuli. [name] is in agreement to continue his current plan of care as already in progress.  Principal Problem: MDD (major depressive disorder), recurrent episode, severe ([place])  Diagnosis: Principal Problem:   MDD  (major depressive disorder), recurrent episode, severe ([place])  Total Time spent with patient: 25 minutes  Past Psychiatric History: See H&P  Past Medical History:  Past Medical History:  Diagnosis Date  . Anxiety   . Anxiety and depression   . Depression   . Hemorrhoids   . Hypertension   . Hypothyroid   . Hypothyroidism   . OSA on CPAP    doesn't use often pt states   . Seizures ([place]) 02/2016  . Suicide and self-inflicted injury [facility])     Past Surgical History:  Procedure Laterality Date  . BICEPS TENDON REPAIR Right   . CARPAL TUNNEL RELEASE Bilateral   . DECORTICATION Left 11/12/2018   Procedure: DECORTICATION;  Surgeon: Ivin Poot, MD;  Location: [place];  Service: Thoracic;  Laterality: Left;  . KNEE CARTILAGE SURGERY Left   . KNEE SURGERY Right    x 2  . LAPAROTOMY N/A 11/03/2018   Procedure: EXPLORATORY LAPAROTOMY;  Surgeon: Erroll Luna, MD;  Location: [place];  Service: General;  Laterality: N/A;  . VIDEO ASSISTED THORACOSCOPY (VATS)/EMPYEMA Left 11/12/2018   Procedure: VIDEO ASSISTED THORACOSCOPY (VATS)/EMPYEMA, MINI THORACOTOMY;  Surgeon: Ivin Poot, MD;  Location: [facility] OR;  Service: Thoracic;  Laterality: Left;  needs cell saver   Family History:  Family History  Problem Relation Age of Onset  . Multiple sclerosis Sister   . Stroke Brother   . Hypertension Father   . Kidney disease Mother   . Breast cancer Paternal Grandmother   .  Kidney failure Maternal Grandmother   . Heart attack Brother        x 3  . Heart defect Maternal Uncle    Family Psychiatric  History: See H&P  Social History:  Social History   Substance and Sexual Activity  Alcohol Use Yes   Comment: none since March 2020     Social History   Substance and Sexual Activity  Drug Use Yes  . Frequency: 3.0 times per week  . Types: Marijuana    Social History   Socioeconomic History  . Marital status: Married    Spouse name: [name]  . Number of children: 1  . Years of  education: 14  . Highest education level: 12th grade  Occupational History  . Occupation: Architect    Comment: remodels houses  . 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 Financial resource strain: Not very hard  . Food insecurity    Worry: Never true    Inability: Never true  . Transportation needs    Medical: No    Non-medical: No  Tobacco Use  . Smoking status: Current Every Day Smoker    Packs/day: 1.75    Years: 51.00    Pack years: 89.25    Types: Cigarettes    Start date: 5  . Smokeless tobacco: Never Used  Substance and Sexual Activity  . Alcohol use: Yes    Comment: none since March 2020  . Drug use: Yes    Frequency: 3.0 times per week    Types: Marijuana  . Sexual activity: Not Currently    Partners: Female    Birth control/protection: None  Lifestyle  . Physical activity    Days per week: Patient refused    Minutes per session: Patient refused  . Stress: Patient refused  Relationships  . Social Herbalist on phone: Patient refused    Gets together: Patient refused    Attends religious service: Patient refused    Active member of club or organization: Patient refused    Attends meetings of clubs or organizations: Patient refused    Relationship status: Patient refused  Other Topics Concern  . Not on file  Social History Narrative   ** Merged History Encounter **       Lives with wife Caffeine- coffee 2 cups daily   Additional Social History:   Sleep: Good  Appetite:  Good  Current Medications: Current Facility-Administered Medications  Medication Dose Route Frequency Provider Last Rate Last Dose  . acetaminophen (TYLENOL) tablet 650 mg  650 mg Oral Q6H PRN Emmaline Kluver, FNP   650 mg at 05/07/19 2056  . alum & mag hydroxide-simeth (MAALOX/MYLANTA) 200-200-20 MG/5ML suspension 30 mL  30 mL Oral Q4H PRN Emmaline Kluver, FNP      . amLODipine (NORVASC) tablet 5 mg  5 mg Oral Daily Connye Burkitt, NP   5 mg at 05/08/19 0819  . carbamazepine (TEGRETOL) tablet 200 mg  200 mg Oral BID Cobos, Myer Peer, MD   200 mg at 05/08/19 0819  . hydrochlorothiazide (HYDRODIURIL) tablet 25 mg  25 mg Oral Daily Connye Burkitt, NP   25 mg at 05/08/19 [id number]  . hydrOXYzine (ATARAX/VISTARIL) tablet 25 mg  25 mg Oral TID PRN Connye Burkitt, NP      . levothyroxine (SYNTHROID) tablet 175 mcg  175 mcg Oral Q0600 Connye Burkitt, NP   175 mcg at 05/08/19  [id number]  . magnesium hydroxide (MILK OF MAGNESIA) suspension 30 mL  30 mL Oral Daily PRN Emmaline Kluver, FNP      . sertraline (ZOLOFT) tablet 50 mg  50 mg Oral Daily Cobos, Myer Peer, MD   50 mg at 05/08/19 0819  . traZODone (DESYREL) tablet 50 mg  50 mg Oral QHS PRN,MR X 1 Connye Burkitt, NP   50 mg at 05/07/19 2231   Facility-Administered Medications Ordered in Other Encounters  Medication Dose Route Frequency Provider Last Rate Last Dose  . gadopentetate dimeglumine (MAGNEVIST) injection 15 mL  15 mL Intravenous Once PRN Sater, Nanine Means, MD       Lab Results:  Results for orders placed or performed during the hospital encounter of 05/06/19 (from the past 48 hour(s))  TSH     Status: Abnormal   Collection Time: 05/08/19  6:32 AM  Result Value Ref Range   TSH 17.705 (H) 0.350 - 4.500 uIU/mL    Comment: Performed by a 3rd Generation assay with a functional sensitivity of <=0.01 uIU/mL. Performed at [facility], [place] [address], [place], [place] [address]    Blood Alcohol level:  Lab Results  Component Value Date   ETH <10 05/04/2019   ETH <10 [id number]   Metabolic Disorder Labs: Lab Results  Component Value Date   HGBA1C 4.9 12/10/2018   MPG 93.93 12/10/2018   No results found for: PROLACTIN Lab Results  Component Value Date   CHOL 159 12/10/2018   TRIG 150 (H) 12/10/2018   HDL 30 (L) 12/10/2018   CHOLHDL 5.3 12/10/2018   VLDL 30 12/10/2018   LDLCALC 99 12/10/2018   Physical Findings: AIMS: Facial and Oral  Movements Muscles of Facial Expression: None, normal Lips and Perioral Area: None, normal Jaw: None, normal Tongue: None, normal,Extremity Movements Upper (arms, wrists, hands, fingers): None, normal Lower (legs, knees, ankles, toes): None, normal, Trunk Movements Neck, shoulders, hips: None, normal, Overall Severity Severity of abnormal movements (highest score from questions above): None, normal Incapacitation due to abnormal movements: None, normal Patient's awareness of abnormal movements (rate only patient's report): No Awareness, Dental Status Current problems with teeth and/or dentures?: No Does patient usually wear dentures?: No  CIWA:  CIWA-Ar Total: 1 COWS:  COWS Total Score: 1  Musculoskeletal: Strength & Muscle Tone: within normal limits Gait & Station: normal Patient leans: N/A  Psychiatric Specialty Exam: Physical Exam  Nursing note and vitals reviewed. Constitutional: He appears well-developed.  Cardiovascular: Normal rate.  Respiratory: No respiratory distress. He has no wheezes.  Genitourinary:    Genitourinary Comments: Deferred   Musculoskeletal: Normal range of motion.  Neurological: He is alert.  Skin: Skin is warm.    Review of Systems  Constitutional: Negative for chills and fever.  Respiratory: Negative for cough, shortness of breath and wheezing.   Cardiovascular: Negative for chest pain and palpitations.  Gastrointestinal: Negative for heartburn, nausea and vomiting.  Neurological: Negative for dizziness and headaches.  Psychiatric/Behavioral: Positive for depression ("Improving") and substance abuse (Hx. alcoholism). Negative for hallucinations, memory loss and suicidal ideas. The patient is nervous/anxious ("Improving"). The patient does not have insomnia.     Blood pressure 111/82, pulse 74, temperature 98.2 F (36.8 C), temperature source Oral, resp. rate 16, height 5\' 8"  (1.727 m), weight 72.1 kg, SpO2 99 %.Body mass index is 24.18 kg/m.   General Appearance: Casual  Eye Contact:  Good  Speech:  Clear and Coherent and Normal Rate  Volume:  Normal  Mood:  Anxious and worrying  Affect:  Tearful  Thought Process:  Coherent, Goal Directed and Descriptions of Associations: Intact  Orientation:  Full (Time, Place, and Person)  Thought Content:  Rumination, denies any hallucinations, delusions or paranoia  Suicidal Thoughts:  Denies  Homicidal Thoughts:  Denies  Memory:  Immediate;   Good Recent;   Good Remote;   Good  Judgement:  Fair  Insight:  Fair  Psychomotor Activity:  Normal  Concentration:  Concentration: Good and Attention Span: Good  Recall:  Good  Fund of Knowledge:  Good  Language:  Good  Akathisia:  Negative  Handed:  Right  AIMS (if indicated):     Assets:  Communication Skills Desire for Improvement Resilience  ADL's:  Intact  Cognition:  WNL  Sleep:  Number of Hours: 6.75   Treatment Plan Summary: Daily contact with patient to assess and evaluate symptoms and progress in treatment and Medication management.  -Continue inpatient hospitalization.  -Will continue today 05/08/2019 plan as below except where it is noted.  -Depression   -Continue zoloft 50 mg po q Day.             -Continue Tegretol 200 mg po bid for mood stabilization.  -Anxiety  -Continue atarax 25 mg po q8h prn anxiety  -Insomnia  -Continue Trazodone 50 mg po qhs  -HTN  -Continue amLodipine 5 mg po qDay   -Continue HCTZ 25 mg po qDay.  Hypothyroidism.             - Continue Synthroid 175 mcg po Q am.  -Encourage participation in groups and therapeutic milieu  -Disposition planning will be ongoing  Lindell Spar, NP, PMHNP, FNP-BC 05/08/2019, 1:43 PM   Attest to NP progress note

## 2019-05-08 NOTE — Progress Notes (Signed)
D: Patient denies SI, HI or AVH this evening. Patient presented as flat, minimal and somewhat irritable.  Pt. Attended evening group but stated that he had nothing to share about his day and reported his goal for tomorrow is to wake up in the morning.    A: Patient given emotional support from RN. Patient encouraged to come to staff with concerns and/or questions. Patient's medication routine continued. Patient's orders and plan of care reviewed.   R: Patient remains appropriate and cooperative. Will continue to monitor patient q15 minutes for safety.

## 2019-05-09 DIAGNOSIS — F332 Major depressive disorder, recurrent severe without psychotic features: Secondary | ICD-10-CM | POA: Diagnosis not present

## 2019-05-09 MED ORDER — LEVOTHYROXINE SODIUM 75 MCG PO TABS
ORAL_TABLET | ORAL | Status: AC
  Filled 2019-05-09: qty 1

## 2019-05-09 MED ORDER — LEVOTHYROXINE SODIUM 100 MCG PO TABS
ORAL_TABLET | ORAL | Status: AC
  Administered 2019-05-09: 06:00:00 175 ug via ORAL
  Filled 2019-05-09: qty 1

## 2019-05-09 MED ORDER — TRAZODONE HCL 50 MG PO TABS
50.0000 mg | ORAL_TABLET | Freq: Every evening | ORAL | Status: DC | PRN
  Administered 2019-05-09: 50 mg via ORAL
  Filled 2019-05-09 (×2): qty 1

## 2019-05-09 NOTE — Progress Notes (Signed)
Adult Psychoeducational Group Note  Date:  05/09/2019 Time:  10:22 PM  Group Topic/Focus:  Wrap-Up Group:   The focus of this group is to help patients review their daily goal of treatment and discuss progress on daily workbooks.  Participation Level:  Minimal  Participation Quality:  Appropriate  Affect:  Appropriate  Cognitive:  Appropriate  Insight: Appropriate  Engagement in Group:  Engaged  Modes of Intervention:  Education and Support  Additional Comments:  Patient attended and participated in group tonight. He reports having a long and boring day.He stayed in the dayroom most of the day and watched television.  Salley Scarlet [facility] 05/09/2019, 10:22 PM
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
## 2019-05-09 NOTE — [facility] Group Notes (Signed)
LCSW Group Therapy Note 05/09/2019 2:12 PM  Type of Therapy/Topic: Group Therapy: Feelings about Diagnosis  Participation Level: Active   Description of Group:  This group will allow patients to explore their thoughts and feelings about diagnoses they have received. Patients will be guided to explore their level of understanding and acceptance of these diagnoses. Facilitator will encourage patients to process their thoughts and feelings about the reactions of others to their diagnosis and will guide patients in identifying ways to discuss their diagnosis with significant others in their lives. This group will be process-oriented, with patients participating in exploration of their own experiences, giving and receiving support, and processing challenge from other group members.  Therapeutic Goals: 1. Patient will demonstrate understanding of diagnosis as evidenced by identifying two or more symptoms of the disorder 2. Patient will be able to express two feelings regarding the diagnosis 3. Patient will demonstrate their ability to communicate their needs through discussion and/or role play  Summary of Patient Progress:  [name] was engaged and participated throughout the group session. [name] reports he has "always thought something was wrong with me". [name] reports that he does not need anything, and "if it meant for me to get through it, I will".      Therapeutic Modalities:  Cognitive Behavioral Therapy Brief Therapy Feelings Identification    [place] Clinical Social Worker

## 2019-05-09 NOTE — [facility] Suicide Risk Assessment (Signed)
[name] INPATIENT:  Family/Significant Other Suicide Prevention Education  Suicide Prevention Education:  Education Completed; wife, [name] [phone] has been identified by the patient as the family member/significant other with whom the patient will be residing, and identified as the person(s) who will aid the patient in the event of a mental health crisis (suicidal ideations/suicide attempt).  With written consent from the patient, the family member/significant other has been provided the following suicide prevention education, prior to the and/or following the discharge of the patient.  The suicide prevention education provided includes the following:  Suicide risk factors  Suicide prevention and interventions  National Suicide Hotline telephone number  [facility] assessment telephone number  [facility] Emergency Assistance [place] and/or Residential Mobile Crisis Unit telephone number  Request made of family/significant other to:  Remove weapons (e.g., guns, rifles, knives), all items previously/currently identified as safety concern.    Remove drugs/medications (over-the-counter, prescriptions, illicit drugs), all items previously/currently identified as a safety concern.  The family member/significant other verbalizes understanding of the suicide prevention education information provided.  The family member/significant other agrees to remove the items of safety concern listed above.  Wife [name] states she wants a transitional plan or level of care for this patient. She does not feel comfortable with the patient returning to their shared home (as patient wishes to do so), as she thinks he will "self isolate." She also shared that his court date for sentencing for his 10th DUI has been continued, a new date is not known at this time. She states that if the patient discharges home from [facility], he will "fall apart" with the looming court date and prison  time.  CSW and wife discussed treatment options and barriers. The barriers to residential treatment include: payor source, court dates/legal issues, waitlists to entering treatment (COVID and reduced capacity), the patient's lack of a current substance use issue (he has been sober since March 2020), and his reluctance to enter treatment.   Wife reports the patient can return to his sister's home at discharge, even if he does not want to. The patient is able to stay with his mother as well, but his wife states that she is concerned the patient would self-isolate at his mother's house too.   Wife expressed safety concerns for the patient harming her grandchildren, due to his outburst at [facility] E[facility]. Though she notes he has never harmed their grandchildren or threatened to do so.  [name] 05/09/2019, 2:09 PM

## 2019-05-09 NOTE — Progress Notes (Signed)
Recreation Therapy Notes  Date:  10.2.20 Time: 0930 Location: [address] Dayroom  Group Topic: Stress Management  Goal Area(s) Addresses:  Patient will identify positive stress management techniques. Patient will identify benefits of using stress management post d/c.  Behavioral Response:  Engaged  Intervention: Stress Management  Activity : Progressive Muscle Relaxation.  LRT introduced the stress management technique of pmr.  LRT read a script that lead group in the process of tensing each muscle group individually and then relaxing them.  Education:  Stress Management, Discharge Planning.   Education Outcome: Acknowledges Education  Clinical Observations/Feedback:  Pt attended and participated in group session.    Victorino Sparrow, LRT/CTRS         Victorino Sparrow A 05/09/2019 11:48 AM

## 2019-05-10 DIAGNOSIS — F332 Major depressive disorder, recurrent severe without psychotic features: Secondary | ICD-10-CM | POA: Diagnosis not present

## 2019-05-10 LAB — CARBAMAZEPINE LEVEL, TOTAL: Carbamazepine Lvl: 6.1 ug/mL (ref 4.0–12.0)

## 2019-05-10 MED ORDER — TRAZODONE HCL 50 MG PO TABS
50.0000 mg | ORAL_TABLET | Freq: Every evening | ORAL | Status: DC | PRN
  Administered 2019-05-10 – 2019-05-11 (×2): 50 mg via ORAL
  Filled 2019-05-10: qty 1

## 2019-05-10 NOTE — Progress Notes (Addendum)
[facility] MD Progress Note  05/10/2019 11:39 AM [name]  MRN:  [id number]  Subjective: [name] " I am feeling better today, just need to figure out what I am going to stay after discharge."   Evaluation: [place] observe sitting in day room interacting with peers during daily group sessions.  Currently denying suicidal or homicidal ideations.  Denies auditory visual hallucinations. [name] reported history of self injures behaviors and felt his mood was deteriorating since moving in with his sister.  reported "  I am  here because I get irritated quickly."  Reports more recently worsening stressors since he has moved in with his sister.  States his niece came to stay and he is been irritable ever since due to lack of discipline and support by additional family members. [name] reports his wife has not allowed him to come home as of yet.  Reports he is taking his medications as prescribed as he is followed by her primary care provider who writes medications for him.  Denies that he is followed up with a psychiatrist and/or therapist.  Reports a good appetite.  States he is resting well throughout the night.  Support,encouragement and  reassurance was provided.   Principal Problem: MDD (major depressive disorder), recurrent episode, severe ([place])  Diagnosis: Principal Problem:   MDD (major depressive disorder), recurrent episode, severe ([place])  Total Time spent with patient: 25 minutes  Past Psychiatric History: See H&P  Past Medical History:  Past Medical History:  Diagnosis Date  . Anxiety   . Anxiety and depression   . Depression   . Hemorrhoids   . Hypertension   . Hypothyroid   . Hypothyroidism   . OSA on CPAP    doesn't use often pt states   . Seizures ([place]) 02/2016  . Suicide and self-inflicted injury [facility])     Past Surgical History:  Procedure Laterality Date  . BICEPS TENDON REPAIR Right   . CARPAL TUNNEL RELEASE Bilateral   . DECORTICATION Left 11/12/2018   Procedure:  DECORTICATION;  Surgeon: Ivin Poot, MD;  Location: [place];  Service: Thoracic;  Laterality: Left;  . KNEE CARTILAGE SURGERY Left   . KNEE SURGERY Right    x 2  . LAPAROTOMY N/A 11/03/2018   Procedure: EXPLORATORY LAPAROTOMY;  Surgeon: Erroll Luna, MD;  Location: [place];  Service: General;  Laterality: N/A;  . VIDEO ASSISTED THORACOSCOPY (VATS)/EMPYEMA Left 11/12/2018   Procedure: VIDEO ASSISTED THORACOSCOPY (VATS)/EMPYEMA, MINI THORACOTOMY;  Surgeon: Ivin Poot, MD;  Location: [facility] OR;  Service: Thoracic;  Laterality: Left;  needs cell saver   Family History:  Family History  Problem Relation Age of Onset  . Multiple sclerosis Sister   . Stroke Brother   . Hypertension Father   . Kidney disease Mother   . Breast cancer Paternal Grandmother   . Kidney failure Maternal Grandmother   . Heart attack Brother        x 3  . Heart defect Maternal Uncle    Family Psychiatric  History: See H&P  Social History:  Social History   Substance and Sexual Activity  Alcohol Use Yes   Comment: none since March 2020     Social History   Substance and Sexual Activity  Drug Use Yes  . Frequency: 3.0 times per week  . Types: Marijuana    Social History   Socioeconomic History  . Marital status: Married    Spouse name: [name]  . Number of children: 1  . Years  of education: 69  . Highest education level: 12th grade  Occupational History  . Occupation: Architect    Comment: remodels houses  . 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 Financial resource strain: Not very hard  . Food insecurity    Worry: Never true    Inability: Never true  . Transportation needs    Medical: No    Non-medical: No  Tobacco Use  . Smoking status: Current Every Day Smoker    Packs/day: 1.75    Years: 51.00    Pack years: 89.25    Types: Cigarettes    Start date: 61  . Smokeless tobacco: Never Used  Substance and Sexual Activity  . Alcohol use:  Yes    Comment: none since March 2020  . Drug use: Yes    Frequency: 3.0 times per week    Types: Marijuana  . Sexual activity: Not Currently    Partners: Female    Birth control/protection: None  Lifestyle  . Physical activity    Days per week: Patient refused    Minutes per session: Patient refused  . Stress: Patient refused  Relationships  . Social Herbalist on phone: Patient refused    Gets together: Patient refused    Attends religious service: Patient refused    Active member of club or organization: Patient refused    Attends meetings of clubs or organizations: Patient refused    Relationship status: Patient refused  Other Topics Concern  . Not on file  Social History Narrative   ** Merged History Encounter **       Lives with wife Caffeine- coffee 2 cups daily   Additional Social History:   Sleep: Good  Appetite:  Good  Current Medications: Current Facility-Administered Medications  Medication Dose Route Frequency Provider Last Rate Last Dose  . acetaminophen (TYLENOL) tablet 650 mg  650 mg Oral Q6H PRN Emmaline Kluver, FNP   650 mg at 05/10/19 1058  . alum & mag hydroxide-simeth (MAALOX/MYLANTA) 200-200-20 MG/5ML suspension 30 mL  30 mL Oral Q4H PRN Emmaline Kluver, FNP      . amLODipine (NORVASC) tablet 5 mg  5 mg Oral Daily Connye Burkitt, NP   5 mg at 05/10/19 0900  . carbamazepine (TEGRETOL) tablet 200 mg  200 mg Oral BID , Myer Peer, MD   200 mg at 05/10/19 0900  . hydrochlorothiazide (HYDRODIURIL) tablet 25 mg  25 mg Oral Daily Connye Burkitt, NP   25 mg at 05/10/19 0900  . hydrOXYzine (ATARAX/VISTARIL) tablet 25 mg  25 mg Oral TID PRN Connye Burkitt, NP   25 mg at 05/09/19 2236  . levothyroxine (SYNTHROID) tablet 175 mcg  175 mcg Oral Q0600 Connye Burkitt, NP   175 mcg at 05/10/19 0902  . magnesium hydroxide (MILK OF MAGNESIA) suspension 30 mL  30 mL Oral Daily PRN Emmaline Kluver, FNP      . sertraline (ZOLOFT) tablet 50 mg  50 mg Oral Daily  , Myer Peer, MD   50 mg at 05/10/19 0900  . traZODone (DESYREL) tablet 50 mg  50 mg Oral QHS PRN , Myer Peer, MD   50 mg at 05/09/19 2236   Facility-Administered Medications Ordered in Other Encounters  Medication Dose Route Frequency Provider Last Rate Last Dose  . gadopentetate dimeglumine (MAGNEVIST) injection 15 mL  15 mL Intravenous Once PRN Sater, Nanine Means, MD  Lab Results:  No results found for this or any previous visit (from the past 48 hour(s)). Blood Alcohol level:  Lab Results  Component Value Date   ETH <10 05/04/2019   ETH <10 [id number]   Metabolic Disorder Labs: Lab Results  Component Value Date   HGBA1C 4.9 12/10/2018   MPG 93.93 12/10/2018   No results found for: PROLACTIN Lab Results  Component Value Date   CHOL 159 12/10/2018   TRIG 150 (H) 12/10/2018   HDL 30 (L) 12/10/2018   CHOLHDL 5.3 12/10/2018   VLDL 30 12/10/2018   LDLCALC 99 12/10/2018   Physical Findings: AIMS: Facial and Oral Movements Muscles of Facial Expression: None, normal Lips and Perioral Area: None, normal Jaw: None, normal Tongue: None, normal,Extremity Movements Upper (arms, wrists, hands, fingers): None, normal Lower (legs, knees, ankles, toes): None, normal, Trunk Movements Neck, shoulders, hips: None, normal, Overall Severity Severity of abnormal movements (highest score from questions above): None, normal Incapacitation due to abnormal movements: None, normal Patient's awareness of abnormal movements (rate only patient's report): No Awareness, Dental Status Current problems with teeth and/or dentures?: No Does patient usually wear dentures?: No  CIWA:  CIWA-Ar Total: 1 COWS:  COWS Total Score: 1  Musculoskeletal: Strength & Muscle Tone: within normal limits Gait & Station: normal Patient leans: N/A  Psychiatric Specialty Exam: Physical Exam  Nursing note and vitals reviewed. Constitutional: He appears well-developed.  Cardiovascular: Normal rate.   Genitourinary:    Genitourinary Comments: Deferred   Neurological: He is alert.  Skin: Skin is warm.    Review of Systems  Psychiatric/Behavioral: Positive for depression ("Improving") and substance abuse (Hx. alcoholism). Negative for suicidal ideas. The patient is nervous/anxious ("Improving").   All other systems reviewed and are negative.   Blood pressure 111/66, pulse 75, temperature 98 F (36.7 C), temperature source Oral, resp. rate 16, height 5\' 8"  (1.727 m), weight 72.1 kg, SpO2 99 %.Body mass index is 24.18 kg/m.  General Appearance: Casual  Eye Contact:  Good  Speech:  Normal Rate  Volume:  Normal  Mood:  Anxious and Depressed  Affect:  Appropriate and Congruent  Thought Process:  Coherent, Goal Directed and Descriptions of Associations: Intact  Orientation:  Full (Time, Place, and Person)  Thought Content:  Hallucinations: None  Suicidal Thoughts:  Denies   Homicidal Thoughts:  Denies  Memory:  Immediate;   Fair Recent;   Fair  Judgement:  Other:  Fair/improving  Insight:  Fair  Psychomotor Activity:  Normal   Concentration:  Concentration: Good and Attention Span: Good  Recall:  Good  Fund of Knowledge:  Good  Language:  Good  Akathisia:  Negative  Handed:  Right  AIMS (if indicated):     Assets:  Communication Skills Desire for Improvement Resilience  ADL's:  Intact  Cognition:  WNL  Sleep:  Number of Hours: 6.25     Treatment Plan Summary: Daily contact with patient to assess and evaluate symptoms and progress in treatment and Medication management.  Continue her current treatment plan on 05/10/2019 as listed below except for noted  Major depressive disorder  ContinueZoloft 50 mg QDAY for depression and anxiety Continue Tegretol 200 mg BID for mood disorder, irritability Continue Vistaril  25 mg  q8h prn anxiety Continue Trazodone 50 mg po qhs PRN insomnia Continue Amlodipine 5 mg QDAY and HCTZ 25 mg QDAY for HTN Continue Synthroid 175 mcg QAM  for Hypothyroidism Carbamazepine serum level in AM- in process results pending   Encourage group  and milieu participation Encourage efforts to work on sobriety and relapse prevention Treatment team working on disposition planning options   Derrill [place], NP 05/10/2019, 11:39 AM  Attest to NP progress note

## 2019-05-10 NOTE — Progress Notes (Signed)
[place] NOVEL CORONAVIRUS (COVID-19) DAILY CHECK-OFF SYMPTOMS - answer yes or no to each - every day NO YES  Have you had a fever in the past 24 hours?  . Fever (Temp > 37.80C / 100F) X   Have you had any of these symptoms in the past 24 hours? . New Cough .  Sore Throat  .  Shortness of Breath .  Difficulty Breathing .  Unexplained Body Aches   X   Have you had any one of these symptoms in the past 24 hours not related to allergies?   . Runny Nose .  Nasal Congestion .  Sneezing   X   If you have had runny nose, nasal congestion, sneezing in the past 24 hours, has it worsened?  X   EXPOSURES - check yes or no X   Have you traveled outside the state in the past 14 days?  X   Have you been in contact with someone with a confirmed diagnosis of COVID-19 or PUI in the past 14 days without wearing appropriate PPE?  X   Have you been living in the same home as a person with confirmed diagnosis of COVID-19 or a PUI (household contact)?    X   Have you been diagnosed with COVID-19?    X              What to do next: Answered NO to all: Answered YES to anything:   Proceed with unit schedule Follow the [facility] Inpatient Flowsheet.   

## 2019-05-10 NOTE — Progress Notes (Signed)
[place] Group Notes:  (Nursing/MHT/Case Management/Adjunct)  Date:  05/10/2019  Time:  1400  Type of Therapy:  Nurse Education  Participation Level:  Did Not Attend

## 2019-05-10 NOTE — Progress Notes (Signed)
D. Pt visible in the milieu interacting appropriately with peers- is pleasant during interactions- per pt's self inventory, pt rated his depression, hopelessness and anxiety a 7/4/9,respectively. Pt writes that his goal today is "getting through the day without exploding" and writes that he will "stay away from crowds" to help him to meet that goal.  Pt currently denies SI/HI and AVH  A. Labs and vitals monitored. Pt compliant with medications. Pt supported emotionally and encouraged to express concerns and ask questions.   R. Pt remains safe with 15 minute checks. Will continue POC.

## 2019-05-10 NOTE — Progress Notes (Signed)
Patient has been pleasant and observed up in the dayroom watching tv.He reports his goal is to work on his anger. Support given and safety maintained on unit with 15 min checks.

## 2019-05-10 NOTE — [facility] Group Notes (Signed)
LCSW Group Therapy Note  05/10/2019    10:00-11:00am   Type of Therapy and Topic:  Group Therapy: Shame and Its Impact   Participation Level:  None   Description of Group:   In this group, patients shared and discussed that guilt is the negative feeling we have when we've done something wrong, while shame is the negative feeling we have simply about "being."  In listening to each other share, patients learned that humans are all imperfect and that there is no shame in this.  We discussed how it could positively impact our wellbeing by accepting our faults as part of our being that can be worked on but does not have to shame [place].    Therapeutic Goals: 1. Patients will learn the difference between guilt and shame. 2. Patients will share their current shame feelings and how this has impacted their current lives. 3. Patients will explore possible ways to think differently about those parts of their bodies, feelings, and lives about which they do have shame. 4. Patients will learn that shame is universal, and that keeping our shame a secret actually increases its hold on [place].  Summary of Patient Progress:  The patient shared that he feels NO shame about anything in his life.  He stated "I am who I am" and indicated that if people don't like that, he does not care.  After other group members shared awhile, CSW if anything shared felt relatable to him, and he responded, "No."  He was not at all engaged in group.  Therapeutic Modalities:   Cognitive Behavioral Therapy Motivation Interviewing  Maretta Los  .

## 2019-05-11 DIAGNOSIS — F332 Major depressive disorder, recurrent severe without psychotic features: Secondary | ICD-10-CM | POA: Diagnosis not present

## 2019-05-11 MED ORDER — IBUPROFEN 600 MG PO TABS
600.0000 mg | ORAL_TABLET | Freq: Four times a day (QID) | ORAL | Status: DC | PRN
  Administered 2019-05-11: 600 mg via ORAL
  Filled 2019-05-11: qty 1

## 2019-05-11 MED ORDER — SERTRALINE HCL 25 MG PO TABS
75.0000 mg | ORAL_TABLET | Freq: Every day | ORAL | Status: DC
  Administered 2019-05-12: 75 mg via ORAL
  Filled 2019-05-11: qty 3
  Filled 2019-05-11: qty 21
  Filled 2019-05-11 (×2): qty 3

## 2019-05-11 NOTE — [facility] Group Notes (Signed)
[place] Group Notes: (Clinical Social Work)   05/11/2019      Type of Therapy:  Group Therapy   Participation Level:  Did Not Attend - was invited both individually by MHT and by overhead announcement, chose not to attend.   Selmer Dominion, LCSW 05/11/2019, 3:15 PM

## 2019-05-11 NOTE — Progress Notes (Signed)
[place] NOVEL CORONAVIRUS (COVID-19) DAILY CHECK-OFF SYMPTOMS - answer yes or no to each - every day NO YES  Have you had a fever in the past 24 hours?  . Fever (Temp > 37.80C / 100F) X   Have you had any of these symptoms in the past 24 hours? . New Cough .  Sore Throat  .  Shortness of Breath .  Difficulty Breathing .  Unexplained Body Aches   X   Have you had any one of these symptoms in the past 24 hours not related to allergies?   . Runny Nose .  Nasal Congestion .  Sneezing   X   If you have had runny nose, nasal congestion, sneezing in the past 24 hours, has it worsened?  X   EXPOSURES - check yes or no X   Have you traveled outside the state in the past 14 days?  X   Have you been in contact with someone with a confirmed diagnosis of COVID-19 or PUI in the past 14 days without wearing appropriate PPE?  X   Have you been living in the same home as a person with confirmed diagnosis of COVID-19 or a PUI (household contact)?    X   Have you been diagnosed with COVID-19?    X              What to do next: Answered NO to all: Answered YES to anything:   Proceed with unit schedule Follow the [facility] Inpatient Flowsheet.   

## 2019-05-11 NOTE — Progress Notes (Addendum)
D. Pt is friendly during interactions-visible in the dayroom  interacting well with peers-Per pt's self inventory, pt rated his depression, hopelessness and anxiety a 7/6/9, respectively. Pt writes that his goal today is "try to not explode" and writes "thanks for your patience and support"Pt currently denies SI/HI and AVH   A. Labs and vitals monitored. Pt compliant with medications. Pt supported emotionally and encouraged to express concerns and ask questions.   R. Pt remains safe with 15 minute checks. Will continue POC.

## 2019-05-11 NOTE — Progress Notes (Signed)
[place] NOVEL CORONAVIRUS (COVID-19) DAILY CHECK-OFF SYMPTOMS - answer yes or no to each - every day NO YES  Have you had a fever in the past 24 hours?  . Fever (Temp > 37.80C / 100F) X   Have you had any of these symptoms in the past 24 hours? . New Cough .  Sore Throat  .  Shortness of Breath .  Difficulty Breathing .  Unexplained Body Aches   X   Have you had any one of these symptoms in the past 24 hours not related to allergies?   . Runny Nose .  Nasal Congestion .  Sneezing   X   If you have had runny nose, nasal congestion, sneezing in the past 24 hours, has it worsened?  X   EXPOSURES - check yes or no X   Have you traveled outside the state in the past 14 days?  X   Have you been in contact with someone with a confirmed diagnosis of COVID-19 or PUI in the past 14 days without wearing appropriate PPE?  X   Have you been living in the same home as a person with confirmed diagnosis of COVID-19 or a PUI (household contact)?    X   Have you been diagnosed with COVID-19?    X              What to do next: Answered NO to all: Answered YES to anything:   Proceed with unit schedule Follow the [facility] Inpatient Flowsheet.   

## 2019-05-11 NOTE — Progress Notes (Addendum)
[facility] MD Progress Note  05/11/2019 10:39 AM [name]  MRN:  [id number]  Subjective: [name] stated " I had a rough morning but I am feeling better now"  Evaluation: [name] stated feeling agitated with his peers on the unit.Continues to present with mood irritability,depression and agitation. Patient reported that his peers can be intrusive which has caused worsening of his mood.  States " I was able to calm himself down." Stated he has filed a complaint to the Health and safety inspector of nursing. [name] is denying suicidal ideations.  Reports homicidal ideation towards everybody that irritates me.  Rates his depression 7 out of 10 with 10 being the worst.  Reports attending daily group sessions with active and engaged participation.  Patient reports he has been forcing himself to attend large groups settings.  Patient continues to report worsening  anxiety and depression. Increased Zoloft 50 mg to 75 mg.     [name] reported that he is unsure if he is unable to reside with his wife and is considering to fill for separation.  Reports some restlessness but overall his mood has improved.  Denies medication side effects.  Reports a good appetite.  States he is resting well throughout the night. Carbamazepine serum level = 6.1 Staff to continue to monitor for safety.  Support,encouragement and reassurance was provided.  Principal Problem: MDD (major depressive disorder), recurrent episode, severe ([place])  Diagnosis: Principal Problem:   MDD (major depressive disorder), recurrent episode, severe ([place])  Total Time spent with patient: 25 minutes  Past Psychiatric History: See H&P  Past Medical History:  Past Medical History:  Diagnosis Date  . Anxiety   . Anxiety and depression   . Depression   . Hemorrhoids   . Hypertension   . Hypothyroid   . Hypothyroidism   . OSA on CPAP    doesn't use often pt states   . Seizures ([place]) 02/2016  . Suicide and self-inflicted injury [facility])     Past Surgical  History:  Procedure Laterality Date  . BICEPS TENDON REPAIR Right   . CARPAL TUNNEL RELEASE Bilateral   . DECORTICATION Left 11/12/2018   Procedure: DECORTICATION;  Surgeon: Ivin Poot, MD;  Location: [place];  Service: Thoracic;  Laterality: Left;  . KNEE CARTILAGE SURGERY Left   . KNEE SURGERY Right    x 2  . LAPAROTOMY N/A 11/03/2018   Procedure: EXPLORATORY LAPAROTOMY;  Surgeon: Erroll Luna, MD;  Location: [place];  Service: General;  Laterality: N/A;  . VIDEO ASSISTED THORACOSCOPY (VATS)/EMPYEMA Left 11/12/2018   Procedure: VIDEO ASSISTED THORACOSCOPY (VATS)/EMPYEMA, MINI THORACOTOMY;  Surgeon: Ivin Poot, MD;  Location: [facility] OR;  Service: Thoracic;  Laterality: Left;  needs cell saver   Family History:  Family History  Problem Relation Age of Onset  . Multiple sclerosis Sister   . Stroke Brother   . Hypertension Father   . Kidney disease Mother   . Breast cancer Paternal Grandmother   . Kidney failure Maternal Grandmother   . Heart attack Brother        x 3  . Heart defect Maternal Uncle    Family Psychiatric  History: See H&P  Social History:  Social History   Substance and Sexual Activity  Alcohol Use Yes   Comment: none since March 2020     Social History   Substance and Sexual Activity  Drug Use Yes  . Frequency: 3.0 times per week  . Types: Marijuana    Social History   Socioeconomic  History  . Marital status: Married    Spouse name: [name]  . Number of children: 1  . Years of education: 37  . Highest education level: 12th grade  Occupational History  . Occupation: Architect    Comment: remodels houses  . 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 Financial resource strain: Not very hard  . Food insecurity    Worry: Never true    Inability: Never true  . Transportation needs    Medical: No    Non-medical: No  Tobacco Use  . Smoking status: Current Every Day Smoker    Packs/day: 1.75    Years:  51.00    Pack years: 89.25    Types: Cigarettes    Start date: 21  . Smokeless tobacco: Never Used  Substance and Sexual Activity  . Alcohol use: Yes    Comment: none since March 2020  . Drug use: Yes    Frequency: 3.0 times per week    Types: Marijuana  . Sexual activity: Not Currently    Partners: Female    Birth control/protection: None  Lifestyle  . Physical activity    Days per week: Patient refused    Minutes per session: Patient refused  . Stress: Patient refused  Relationships  . Social Herbalist on phone: Patient refused    Gets together: Patient refused    Attends religious service: Patient refused    Active member of club or organization: Patient refused    Attends meetings of clubs or organizations: Patient refused    Relationship status: Patient refused  Other Topics Concern  . Not on file  Social History Narrative   ** Merged History Encounter **       Lives with wife Caffeine- coffee 2 cups daily   Additional Social History:   Sleep: Good  Appetite:  Good  Current Medications: Current Facility-Administered Medications  Medication Dose Route Frequency Provider Last Rate Last Dose  . acetaminophen (TYLENOL) tablet 650 mg  650 mg Oral Q6H PRN Emmaline Kluver, FNP   650 mg at 05/11/19 0809  . alum & mag hydroxide-simeth (MAALOX/MYLANTA) 200-200-20 MG/5ML suspension 30 mL  30 mL Oral Q4H PRN Emmaline Kluver, FNP      . amLODipine (NORVASC) tablet 5 mg  5 mg Oral Daily Connye Burkitt, NP   5 mg at 05/11/19 0809  . carbamazepine (TEGRETOL) tablet 200 mg  200 mg Oral BID Naome Brigandi, Myer Peer, MD   200 mg at 05/11/19 0809  . hydrochlorothiazide (HYDRODIURIL) tablet 25 mg  25 mg Oral Daily Connye Burkitt, NP   25 mg at 05/11/19 0809  . hydrOXYzine (ATARAX/VISTARIL) tablet 25 mg  25 mg Oral TID PRN Connye Burkitt, NP   25 mg at 05/10/19 2116  . levothyroxine (SYNTHROID) tablet 175 mcg  175 mcg Oral Q0600 Connye Burkitt, NP   175 mcg at 05/11/19 [id number]  .  magnesium hydroxide (MILK OF MAGNESIA) suspension 30 mL  30 mL Oral Daily PRN Emmaline Kluver, FNP      . sertraline (ZOLOFT) tablet 50 mg  50 mg Oral Daily Panda Crossin, Myer Peer, MD   50 mg at 05/11/19 0809  . traZODone (DESYREL) tablet 50 mg  50 mg Oral QHS PRN,MR X 1 Lindon Romp A, NP   50 mg at 05/10/19 2236   Facility-Administered Medications Ordered in Other Encounters  Medication Dose Route Frequency Provider Last  Rate Last Dose  . gadopentetate dimeglumine (MAGNEVIST) injection 15 mL  15 mL Intravenous Once PRN Sater, Nanine Means, MD       Lab Results:  Results for orders placed or performed during the hospital encounter of 05/06/19 (from the past 48 hour(s))  Carbamazepine level, total     Status: None   Collection Time: 05/10/19  6:59 AM  Result Value Ref Range   Carbamazepine Lvl 6.1 4.0 - 12.0 ug/mL    Comment: Performed at [facility], [address], [place], [place] [address]   Blood Alcohol level:  Lab Results  Component Value Date   [facility] <10 05/04/2019   ETH <10 [id number]   Metabolic Disorder Labs: Lab Results  Component Value Date   HGBA1C 4.9 12/10/2018   MPG 93.93 12/10/2018   No results found for: PROLACTIN Lab Results  Component Value Date   CHOL 159 12/10/2018   TRIG 150 (H) 12/10/2018   HDL 30 (L) 12/10/2018   CHOLHDL 5.3 12/10/2018   VLDL 30 12/10/2018   LDLCALC 99 12/10/2018   Physical Findings: AIMS: Facial and Oral Movements Muscles of Facial Expression: None, normal Lips and Perioral Area: None, normal Jaw: None, normal Tongue: None, normal,Extremity Movements Upper (arms, wrists, hands, fingers): None, normal Lower (legs, knees, ankles, toes): None, normal, Trunk Movements Neck, shoulders, hips: None, normal, Overall Severity Severity of abnormal movements (highest score from questions above): None, normal Incapacitation due to abnormal movements: None, normal Patient's awareness of abnormal movements (rate only patient's report): No  Awareness, Dental Status Current problems with teeth and/or dentures?: No Does patient usually wear dentures?: No  CIWA:  CIWA-Ar Total: 1 COWS:  COWS Total Score: 1  Musculoskeletal: Strength & Muscle Tone: within normal limits Gait & Station: normal Patient leans: N/A  Psychiatric Specialty Exam: Physical Exam  Nursing note and vitals reviewed. Constitutional: He appears well-developed.  Cardiovascular: Normal rate.  Genitourinary:    Genitourinary Comments: Deferred   Neurological: He is alert.  Skin: Skin is warm.  Psychiatric: He has a normal mood and affect.    Review of Systems  Psychiatric/Behavioral: Positive for depression ("Improving") and substance abuse (Hx. alcoholism). Negative for suicidal ideas. The patient is nervous/anxious ("Improving").   All other systems reviewed and are negative.   Blood pressure 106/84, pulse 77, temperature 97.7 F (36.5 C), temperature source Oral, resp. rate 16, height 5\' 8"  (1.727 m), weight 72.1 kg, SpO2 99 %.Body mass index is 24.18 kg/m.  General Appearance: Casual  Eye Contact:  Good  Speech:  Normal Rate  Volume:  Normal  Mood:  Angry, Depressed and Irritable  Affect:  Appropriate, Congruent and Labile  Thought Process:  Coherent and Linear  Orientation:  Full (Time, Place, and Person)  Thought Content:  Hallucinations: None  Suicidal Thoughts:  Denies   Homicidal Thoughts:  Denies  Memory:  Immediate;   Fair Recent;   Fair  Judgement:  Other:  Fair/improving  Insight:  Fair  Psychomotor Activity:  Normal   Concentration:  Concentration: Good and Attention Span: Good  Recall:  Good  Fund of Knowledge:  Good  Language:  Good  Akathisia:  Negative  Handed:  Right  AIMS (if indicated):     Assets:  Communication Skills Desire for Improvement Resilience  ADL's:  Intact  Cognition:  WNL  Sleep:  Number of Hours: 6     Treatment Plan Summary: Daily contact with patient to assess and evaluate symptoms and  progress in treatment and  Medication management.  Continue her current treatment plan on 05/11/2019 as listed below except for noted  Major depressive disorder:    Increased Zoloft 50 mg to 75 mg  QDAY for depression and anxiety Continue Tegretol 200 mg BID for mood disorder, irritability Continue Vistaril  25 mg  q8h prn anxiety Continue Trazodone 50 mg po qhs PRN insomnia Continue Amlodipine 5 mg QDAY and HCTZ 25 mg QDAY for HTN Continue Synthroid 175 mcg QAM for Hypothyroidism Carbamazepine serum level = 6.1  Encourage group and milieu participation Encourage efforts to work on sobriety and relapse prevention Treatment team working on disposition planning options   Derrill Center, NP  05/11/2019, 10:39 AM   Attest to NP progress note

## 2019-05-11 NOTE — Progress Notes (Signed)
D.  Pt pleasant on approach, continues to report homicidal ideation "if anyone pushes me" on his group questionaire.  When asked about this patient states he can not completely say that he will come to staff if someone upsets him before "handling it himself".  Pt is a no roommate for this reason.  Pt denies SI/AVH at this time.  A.  Support and encouragement offered, again stressed to patient to come to staff if someone upsets him.  Medication given as ordered  R. Pt remains safe on the unit, will continue to monitor.

## 2019-05-11 NOTE — Progress Notes (Signed)
Pt spoke with this staff member this morning and stated that he believed a male peers had walked into his room this morning before wake up call.  Pt confronted peer in medication line and peer denied this.  Pt then said to this staff member that he would take care of it if he finds out it was peer.  AC came onto unit and spoke with patient and after this she said patient was able to work through his feelings and be calm.

## 2019-05-12 DIAGNOSIS — F332 Major depressive disorder, recurrent severe without psychotic features: Secondary | ICD-10-CM | POA: Diagnosis not present

## 2019-05-12 MED ORDER — SERTRALINE HCL 25 MG PO TABS: 75.0000 mg | ORAL_TABLET | Freq: Every day | ORAL | 0 refills | Status: DC

## 2019-05-12 MED ORDER — LEVOTHYROXINE SODIUM 175 MCG PO TABS: 175.0000 ug | ORAL_TABLET | Freq: Every day | ORAL | 0 refills | Status: DC

## 2019-05-12 MED ORDER — CARBAMAZEPINE 200 MG PO TABS: 200.0000 mg | ORAL_TABLET | Freq: Two times a day (BID) | ORAL | 0 refills | Status: DC

## 2019-05-12 MED ORDER — HYDROCHLOROTHIAZIDE 25 MG PO TABS: 25.0000 mg | ORAL_TABLET | Freq: Every day | ORAL | 0 refills | Status: DC

## 2019-05-12 MED ORDER — AMLODIPINE BESYLATE 5 MG PO TABS: 5.0000 mg | ORAL_TABLET | Freq: Every day | ORAL | 0 refills | Status: DC

## 2019-05-12 MED ORDER — HYDROXYZINE HCL 25 MG PO TABS: 25.0000 mg | ORAL_TABLET | Freq: Three times a day (TID) | ORAL | 0 refills | Status: DC | PRN

## 2019-05-12 NOTE — BHH Suicide Risk Assessment (Signed)
[facility] Discharge Suicide Risk Assessment   Principal Problem: MDD (major depressive disorder), recurrent episode, severe ([place]) Discharge Diagnoses: Principal Problem:   MDD (major depressive disorder), recurrent episode, severe ([place])   Total Time spent with patient: 30 minutes  Musculoskeletal: Strength & Muscle Tone: within normal limits Gait & Station: normal Patient leans: N/A  Psychiatric Specialty Exam: ROS currently denies chest pain or shortness of breath, no vomiting , no fever or chills   Blood pressure 126/89, pulse 74, temperature (!) 97.5 F (36.4 C), temperature source Oral, resp. rate 16, height 5\' 8"  (1.727 m), weight 72.1 kg, SpO2 99 %.Body mass index is 24.18 kg/m.  General Appearance: improving grooming   Eye Contact::  Good  Speech:  Normal Rate409  Volume:  Normal  Mood:  reports mood is " better", states mood today is "OK"  Affect:  appropriate, reactive  Thought Process:  Linear and Descriptions of Associations: Intact  Orientation:  Full (Time, Place, and Person)  Thought Content:  denies hallucinations, no delusions, not internally preoccupied   Suicidal Thoughts:  No denies suicidal or self injurious ideations, denies homicidal or violent ideations  Homicidal Thoughts:  No  Memory:  recent and remote grossly intact   Judgement:  Other:  improving   Insight:  improving   Psychomotor Activity:  Normal  Concentration:  Good  Recall:  Good  Fund of Knowledge:Good  Language: Good  Akathisia:  Negative  Handed:  Right  AIMS (if indicated):     Assets:  Communication Skills Desire for Improvement Resilience  Sleep:  Number of Hours: 6.25  Cognition: WNL  ADL's:  Intact   Mental Status Per Nursing Assessment::   On Admission:  NA  Demographic Factors:  58 year old married male, one adult daughter, unemployed, had been living with sister prior to admission  Loss Factors: Unemployment, reports had been living with sister rather than with wife prior  to admission and that environment at sister's was stressful due to too many people living there.  Historical Factors: History of depression, alcohol use disorder, history of serious suicide attempt by firearm in March 2020  Risk Reduction Factors:   Sense of responsibility to family, Living with another person, especially a relative, Positive social support and Positive coping skills or problem solving skills  Continued Clinical Symptoms:   At this time patient presents alert, attentive, well groomed, mood is improved and reports he is feeling significantly better than on admission, affect is appropriate and reactive, not irritable at this time, no thought disorder, no hallucinations, no delusions , denies suicidal ideations and states " I learnt my lesson, I am never going to try that again", denies homicidal ideations. Currently reports he is happy that his wife has agreed for him to return home, which he states is a [employer] se[employer], and more supportive environment than living with his sister. Denies cravings for alcohol and states he intends to maintain sobriety. Behavior on unit calm and in good control. Pleasant on approach. Denies medication side effects.  Cognitive Features That Contribute To Risk:  No gross cognitive deficits noted upon discharge. Is alert , attentive, and oriented x 3   Suicide Risk:  Mild:  Suicidal ideation of limited frequency, intensity, duration, and specificity.  There are no identifiable plans, no associated intent, mild dysphoria and related symptoms, good self-control (both objective and subjective assessment), few other risk factors, and identifiable protective factors, including available and accessible social support.  Follow-up Information    [facility] Follow  up on 05/14/2019.   Why: Hospital discharge appointment is Wednesday, 10/7 at 10:00a. Please make sure to schedule a therapy appointment with [facility] after this appt.  Please bring your  photo ID, insurance card, and current medications.  Contact information: [place]  [id number] ph: [phone] [phone] [facility] Follow up on 05/13/2019.   Specialty: Behavioral Health Why: Medication management appointment with Dr. Mamie Nick is Tuesday, 10/6 at 9:00a.  Appt will be virtual and an email will be sent to you with the information. Contact information: [address] [place] [phone]          Plan Of Care/Follow-up recommendations:  Activity:  as tolerated Diet:  Heart Healthy Tests:  NA Other:  See below  Patient is expressing readiness for discharge, and is leaving unit in good spirits  Plans to return home , states wife will be picking him up later today Follow up as above  Patient to follow up with PCP for medical management as needed .  Jenne Campus, MD 05/12/2019, 10:59 AM

## 2019-05-12 NOTE — Tx Team (Signed)
Interdisciplinary Treatment and Diagnostic Plan Update  05/12/2019 Time of Session: 9:15am [name] MRN: [id number]  Principal Diagnosis: MDD (major depressive disorder), recurrent episode, severe ([place])  Secondary Diagnoses: Principal Problem:   MDD (major depressive disorder), recurrent episode, severe ([place])   Current Medications:  Current Facility-Administered Medications  Medication Dose Route Frequency Provider Last Rate Last Dose  . acetaminophen (TYLENOL) tablet 650 mg  650 mg Oral Q6H PRN Emmaline Kluver, FNP   650 mg at 05/11/19 1426  . alum & mag hydroxide-simeth (MAALOX/MYLANTA) 200-200-20 MG/5ML suspension 30 mL  30 mL Oral Q4H PRN Emmaline Kluver, FNP      . amLODipine (NORVASC) tablet 5 mg  5 mg Oral Daily Connye Burkitt, NP   5 mg at 05/12/19 0831  . carbamazepine (TEGRETOL) tablet 200 mg  200 mg Oral BID Cobos, Myer Peer, MD   200 mg at 05/12/19 0831  . hydrochlorothiazide (HYDRODIURIL) tablet 25 mg  25 mg Oral Daily Connye Burkitt, NP   25 mg at 05/12/19 [id number]  . hydrOXYzine (ATARAX/VISTARIL) tablet 25 mg  25 mg Oral TID PRN Connye Burkitt, NP   25 mg at 05/11/19 2143  . ibuprofen (ADVIL) tablet 600 mg  600 mg Oral Q6H PRN Lindon Romp A, NP   600 mg at 05/11/19 2223  . levothyroxine (SYNTHROID) tablet 175 mcg  175 mcg Oral Q0600 Connye Burkitt, NP   175 mcg at 05/12/19 [id number]  . magnesium hydroxide (MILK OF MAGNESIA) suspension 30 mL  30 mL Oral Daily PRN Emmaline Kluver, FNP      . sertraline (ZOLOFT) tablet 75 mg  75 mg Oral Daily Derrill Center, NP   75 mg at 05/12/19 [id number]  . traZODone (DESYREL) tablet 50 mg  50 mg Oral QHS PRN,MR X 1 Lindon Romp A, NP   50 mg at 05/11/19 2143   Facility-Administered Medications Ordered in Other Encounters  Medication Dose Route Frequency Provider Last Rate Last Dose  . gadopentetate dimeglumine (MAGNEVIST) injection 15 mL  15 mL Intravenous Once PRN Sater, Nanine Means, MD       PTA Medications: Medications Prior to Admission   Medication Sig Dispense Refill Last Dose  . acamprosate (CAMPRAL) 333 MG tablet Take 2 tablets (666 mg total) by mouth 3 (three) times daily with meals. 180 tablet 0   . amLODipine (NORVASC) 5 MG tablet Take 1 tablet (5 mg total) by mouth daily. 30 tablet 0   . carbamazepine (TEGRETOL) 200 MG tablet Take 1 tablet (200 mg total) by mouth at bedtime. 30 tablet 0   . hydrochlorothiazide (HYDRODIURIL) 25 MG tablet Take 25 mg by mouth.     . hydrOXYzine (ATARAX/VISTARIL) 25 MG tablet Take 1 tablet (25 mg total) by mouth every 6 (six) hours as needed for anxiety. 30 tablet 0   . levothyroxine (SYNTHROID) 175 MCG tablet Take 1 tablet (175 mcg total) by mouth daily at 6 (six) AM. 30 tablet 0   . olmesartan (BENICAR) 40 MG tablet Take 40 mg by mouth daily.     . sertraline (ZOLOFT) 100 MG tablet Take 1 tablet (100 mg total) by mouth daily. 30 tablet 0     Patient Stressors: Financial difficulties Marital or family conflict Substance abuse  Patient Strengths: Ability for insight Average or above average Architect for treatment/growth  Treatment Modalities: Medication Management, Group therapy, Case management,  1 to 1 session with clinician, Psychoeducation, Recreational therapy.   Physician Treatment  Plan for Primary Diagnosis: MDD (major depressive disorder), recurrent episode, severe ([place]) Long Term Goal(s): Improvement in symptoms so as ready for discharge Improvement in symptoms so as ready for discharge   Short Term Goals: Ability to identify changes in lifestyle to reduce recurrence of condition will improve Ability to verbalize feelings will improve Ability to disclose and discuss suicidal ideas Ability to demonstrate self-control will improve Ability to identify and develop effective coping behaviors will improve  Medication Management: Evaluate patient's response, side effects, and tolerance of medication regimen.  Therapeutic Interventions: 1  to 1 sessions, Unit Group sessions and Medication administration.  Evaluation of Outcomes: Adequate for Discharge  Physician Treatment Plan for Secondary Diagnosis: Principal Problem:   MDD (major depressive disorder), recurrent episode, severe ([place])  Long Term Goal(s): Improvement in symptoms so as ready for discharge Improvement in symptoms so as ready for discharge   Short Term Goals: Ability to identify changes in lifestyle to reduce recurrence of condition will improve Ability to verbalize feelings will improve Ability to disclose and discuss suicidal ideas Ability to demonstrate self-control will improve Ability to identify and develop effective coping behaviors will improve     Medication Management: Evaluate patient's response, side effects, and tolerance of medication regimen.  Therapeutic Interventions: 1 to 1 sessions, Unit Group sessions and Medication administration.  Evaluation of Outcomes: Adequate for Discharge   RN Treatment Plan for Primary Diagnosis: MDD (major depressive disorder), recurrent episode, severe ([place]) Long Term Goal(s): Knowledge of disease and therapeutic regimen to maintain health will improve  Short Term Goals: Ability to verbalize frustration and anger appropriately will improve, Ability to demonstrate self-control, Ability to participate in decision making will improve, Ability to disclose and discuss suicidal ideas, Ability to identify and develop effective coping behaviors will improve and Compliance with prescribed medications will improve  Medication Management: RN will administer medications as ordered by provider, will assess and evaluate patient's response and provide education to patient for prescribed medication. RN will report any adverse and/or side effects to prescribing provider.  Therapeutic Interventions: 1 on 1 counseling sessions, Psychoeducation, Medication administration, Evaluate responses to treatment, Monitor vital signs and  CBGs as ordered, Perform/monitor CIWA, COWS, AIMS and Fall Risk screenings as ordered, Perform wound care treatments as ordered.  Evaluation of Outcomes: Adequate for Discharge   LCSW Treatment Plan for Primary Diagnosis: MDD (major depressive disorder), recurrent episode, severe ([place]) Long Term Goal(s): Safe transition to appropriate next level of care at discharge, Engage patient in therapeutic group addressing interpersonal concerns.  Short Term Goals: Engage patient in aftercare planning with referrals and resources, Identify triggers associated with mental health/substance abuse issues and Increase skills for wellness and recovery  Therapeutic Interventions: Assess for all discharge needs, 1 to 1 time with Social worker, Explore available resources and support systems, Assess for adequacy in community support network, Educate family and significant other(s) on suicide prevention, Complete Psychosocial Assessment, Interpersonal group therapy.  Evaluation of Outcomes: Adequate for Discharge   Progress in Treatment: Attending groups: Yes.  Participating in groups: Yes. Taking medication as prescribed: Yes. Toleration medication: Yes. Family/Significant other contact made: Yes, individual(s) contacted:  wife, [name]. Patient understands diagnosis: Yes. Discussing patient identified problems/goals with staff: Yes. Medical problems stabilized or resolved: Yes. Denies suicidal/homicidal ideation: Yes. Issues/concerns per patient self-inventory: No. Other:   New problem(s) identified: None   New Short Term/Long Term Goal(s): medication stabilization, elimination of SI thoughts, development of comprehensive mental wellness plan.    Patient Goals: "To get my  mind together and I need to get my medications figured out"     Discharge Plan or Barriers: Living with family, following up with outpatient providers.   Reason for Continuation of Hospitalization: Anxiety Depression  Estimated  Length of Stay: 3-5 days   Attendees: Patient: [name] 05/12/2019 10:14 AM  Physician: Dr. Neita Garnet, MD 05/12/2019 10:14 AM  Nursing: Rise Paganini.Raliegh Ip, RN 05/12/2019 10:14 AM  RN Care Manager: 05/12/2019 10:14 AM  Social Worker: Radonna Ricker, LCSW [place], [place] 05/12/2019 10:14 AM  Recreational Therapist:  05/12/2019 10:14 AM  Other:  05/12/2019 10:14 AM  Other:  05/12/2019 10:14 AM  Other: 05/12/2019 10:14 AM    Scribe for Treatment Team: Joellen Jersey, [place] 05/12/2019 10:14 AM

## 2019-05-12 NOTE — Progress Notes (Signed)
Adult Psychoeducational Group Note  Date:  05/12/2019 Time:  3:37 AM  Group Topic/Focus:  Wrap-Up Group:   The focus of this group is to help patients review their daily goal of treatment and discuss progress on daily workbooks.  Participation Level:  Active  Participation Quality:  Monopolizing  Affect:  Blunted  Cognitive:  Disorganized  Insight: None  Engagement in Group:  Distracting  Modes of Intervention:  Confrontation  Additional Comments:  Pt said his day was a 6. His goal for today was to make it thru.  Lenice Llamas Long 05/12/2019, 3:37 AM

## 2019-05-12 NOTE — Progress Notes (Signed)
Pt discharged to lobby. Pt was stable and appreciative at that time. All papers, samples and prescriptions were given and valuables returned. Verbal understanding expressed. Denies SI/HI and A/VH. Pt given opportunity to express concerns and ask questions.  

## 2019-05-12 NOTE — [facility] Group Notes (Signed)
LCSW Group Therapy Note 05/12/2019 2:43 PM  Type of Therapy and Topic: Group Therapy: Overcoming Obstacles  Participation Level: Active  Description of Group:  In this group patients will be encouraged to explore what they see as obstacles to their own wellness and recovery. They will be guided to discuss their thoughts, feelings, and behaviors related to these obstacles. The group will process together ways to cope with barriers, with attention given to specific choices patients can make. Each patient will be challenged to identify changes they are motivated to make in order to overcome their obstacles. This group will be process-oriented, with patients participating in exploration of their own experiences as well as giving and receiving support and challenge from other group members.  Therapeutic Goals: 1. Patient will identify personal and current obstacles as they relate to admission. 2. Patient will identify barriers that currently interfere with their wellness or overcoming obstacles.  3. Patient will identify feelings, thought process and behaviors related to these barriers. 4. Patient will identify two changes they are willing to make to overcome these obstacles:   Summary of Patient Progress  Markice was engaged and participated throughout the group session. Linas reports that his main obstacle is him potentially going to jail when he discharges from the hospital. He reports his main concerns are the "unknown" and his social anxiety being triggered once he is in jail.     Therapeutic Modalities:  Cognitive Behavioral Therapy Solution Focused Therapy Motivational Interviewing Relapse Prevention Therapy   Theresa Duty Clinical Social Worker
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
## 2019-05-12 NOTE — Progress Notes (Signed)
  [facility] Adult Case Management Discharge Plan :  Will you be returning to the same living situation after discharge:  Yes,  patient reports he is returning home with his wife At discharge, do you have transportation home?: Yes,  patient's reports his wife is picking him up Do you have the ability to pay for your medications: No.  Release of information consent forms completed and in the chart;  Patient's signature needed at discharge.  Patient to Follow up at: Follow-up Information    [facility] Follow up on 05/14/2019.   Why: Hospital discharge appointment is Wednesday, 10/7 at 10:00a. Please make sure to schedule a therapy appointment with [facility] after this appt.  Please bring your photo ID, insurance card, and current medications.  Contact information: [place]  [id number] ph: [phone] fx:([phone]) [place] [facility] Follow up on 05/13/2019.   Specialty: Behavioral Health Why: Medication management appointment with Dr. Mamie Nick is Tuesday, 10/6 at 9:00a.  Appt will be virtual and an email will be sent to you with the information. Contact information: [address] [place] [phone]          Next level of care provider has access to [place] and Suicide Prevention discussed: Yes,  with the patient's wife     Has patient been referred to the Quitline?: Patient refused referral  Patient has been referred for addiction treatment: Pt. refused referral  Marylee Floras, [place] 05/12/2019, 10:43 AM

## 2019-05-12 NOTE — Progress Notes (Signed)
Recreation Therapy Notes  Date:  10.5.20 Time: 0930 Location: [address] Dayroom  Group Topic: Stress Management  Goal Area(s) Addresses:  Patient will identify positive stress management techniques. Patient will identify benefits of using stress management post d/c.  Behavioral Response: Engaged  Intervention: Stress Management  Activity : Music.  Patients were given the opportunity to request songs that were soothing, peaceful and feel good.  Songs were to be appropriate meaning no cursing, not sexually explicit or any language about drug/alcohol use.   Education:  Stress Management, Discharge Planning.   Education Outcome: Acknowledges Education  Clinical Observations/Feedback: Pt attended and participated in activity.     Victorino Sparrow, LRT/CTRS         Ria Comment, Bertran Zeimet A 05/12/2019 10:35 AM

## 2019-05-13 ENCOUNTER — Other Ambulatory Visit: Payer: Self-pay

## 2019-05-13 ENCOUNTER — Ambulatory Visit (INDEPENDENT_AMBULATORY_CARE_PROVIDER_SITE_OTHER): Payer: No Typology Code available for payment source | Admitting: Psychiatry

## 2019-05-13 DIAGNOSIS — F1021 Alcohol dependence, in remission: Secondary | ICD-10-CM

## 2019-05-13 DIAGNOSIS — F3161 Bipolar disorder, current episode mixed, mild: Secondary | ICD-10-CM

## 2019-05-13 DIAGNOSIS — F1220 Cannabis dependence, uncomplicated: Secondary | ICD-10-CM

## 2019-05-13 DIAGNOSIS — F1221 Cannabis dependence, in remission: Secondary | ICD-10-CM | POA: Insufficient documentation

## 2019-05-13 NOTE — Progress Notes (Signed)
Spiritual care group on grief and loss facilitated by chaplain Jerene Pitch  Group Goal:  Support / Education around grief and loss Members engage in facilitated group support and psycho-social education.  Group Description:  Following introductions and group rules, group members engaged in facilitated group dialog and support around topic of loss, with particular support around experiences of loss in their lives. Group Identified types of loss (relationships / self / things) and identified patterns, circumstances, and changes that precipitate losses. Reflected on thoughts / feelings around loss, normalized grief responses, and recognized variety in grief experience. Patient Progress: [name] was present at beginning of group.  He was attentive to group introductions.   He left group as another group member was intrusive in group time.
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
## 2019-05-14 ENCOUNTER — Telehealth: Payer: Self-pay | Admitting: Cardiology

## 2019-05-14 NOTE — Telephone Encounter (Signed)
LVM for patient to call and schedule new patient appt with Dr. Gardiner Rhyme in 1 month.

## 2019-05-14 NOTE — Telephone Encounter (Signed)
Called home phone, but, was a busy signal.

## 2019-05-16 ENCOUNTER — Telehealth: Payer: Self-pay | Admitting: Cardiology

## 2019-05-16 NOTE — Telephone Encounter (Signed)
LVM for patient to call back and schedule a new patient appointment with Dr. Gardiner Rhyme in 1 month.

## 2019-06-05 ENCOUNTER — Telehealth (HOSPITAL_COMMUNITY): Payer: Self-pay

## 2019-06-05 NOTE — Telephone Encounter (Signed)
I referred his request  to [facility] pharmacist . Patient was seen as inpatient but is not an outpatient of mine . I will speak with pharmacist again tomorrow AM Thanks

## 2019-06-05 NOTE — Telephone Encounter (Signed)
Patient called requesting a refill on his Hydroxyzine 25mg  to be sent to [facility] on the corner of [address] in [place]. Thank you.

## 2019-06-13 ENCOUNTER — Other Ambulatory Visit: Payer: Self-pay

## 2019-06-13 ENCOUNTER — Ambulatory Visit (INDEPENDENT_AMBULATORY_CARE_PROVIDER_SITE_OTHER): Payer: No Typology Code available for payment source | Admitting: Psychiatry

## 2019-06-13 DIAGNOSIS — F3131 Bipolar disorder, current episode depressed, mild: Secondary | ICD-10-CM

## 2019-06-13 DIAGNOSIS — F1021 Alcohol dependence, in remission: Secondary | ICD-10-CM

## 2019-06-13 DIAGNOSIS — F1220 Cannabis dependence, uncomplicated: Secondary | ICD-10-CM

## 2019-06-13 MED ORDER — CARBAMAZEPINE 200 MG PO TABS: 200.0000 mg | ORAL_TABLET | Freq: Two times a day (BID) | ORAL | 2 refills | Status: DC

## 2019-06-13 MED ORDER — HYDROXYZINE HCL 25 MG PO TABS: 25.0000 mg | ORAL_TABLET | Freq: Three times a day (TID) | ORAL | 2 refills | Status: DC | PRN

## 2019-06-13 MED ORDER — SERTRALINE HCL 100 MG PO TABS: 100.0000 mg | ORAL_TABLET | Freq: Every day | ORAL | 0 refills | Status: DC
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
## 2019-06-17 ENCOUNTER — Encounter (HOSPITAL_COMMUNITY): Payer: Self-pay

## 2019-06-24 ENCOUNTER — Telehealth (HOSPITAL_COMMUNITY): Payer: Self-pay | Admitting: *Deleted

## 2019-07-28 ENCOUNTER — Other Ambulatory Visit: Payer: Self-pay

## 2019-07-28 ENCOUNTER — Ambulatory Visit (INDEPENDENT_AMBULATORY_CARE_PROVIDER_SITE_OTHER): Payer: Medicaid Other | Admitting: Psychiatry

## 2019-07-28 DIAGNOSIS — F1220 Cannabis dependence, uncomplicated: Secondary | ICD-10-CM

## 2019-07-28 DIAGNOSIS — F1021 Alcohol dependence, in remission: Secondary | ICD-10-CM | POA: Diagnosis not present

## 2019-07-28 DIAGNOSIS — F3161 Bipolar disorder, current episode mixed, mild: Secondary | ICD-10-CM

## 2019-07-28 MED ORDER — CARBAMAZEPINE 200 MG PO TABS: 200.0000 mg | ORAL_TABLET | Freq: Two times a day (BID) | ORAL | 0 refills | Status: DC

## 2019-07-28 MED ORDER — QUETIAPINE FUMARATE 50 MG PO TABS: 50.0000 mg | ORAL_TABLET | Freq: Every day | ORAL | 0 refills | Status: DC

## 2019-07-28 MED ORDER — HYDROXYZINE HCL 25 MG PO TABS: 25.0000 mg | ORAL_TABLET | Freq: Two times a day (BID) | ORAL | 0 refills | Status: AC | PRN

## 2019-07-28 MED ORDER — SERTRALINE HCL 100 MG PO TABS: 100.0000 mg | ORAL_TABLET | Freq: Every day | ORAL | 0 refills | Status: DC
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
## 2019-08-13 ENCOUNTER — Telehealth (HOSPITAL_COMMUNITY): Payer: Self-pay | Admitting: *Deleted

## 2019-08-13 NOTE — Telephone Encounter (Signed)
FYI Received message from pt wife, [name] [phone], stating that pt is currently incarcerated in [facility] as you are aware. Wife says that pt has been c/o decreased sleep as the medications are give at either 1600 or 1700 therefore pt does not sleep all night. Contact person at the jail is Mr.Shane Bullins [phone]. Wife would like you to call him please. Or I can call with your instructions.

## 2019-08-13 NOTE — Telephone Encounter (Signed)
I have faxed them detailed instructions which medications and at what time he is supposed to take them (spoke with medication nurse there). 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 I would appreciate if you could call [name] and politely ask (I think he is a sheriff there) if [name] could not get his nighttime meds at nighttime but I would not hold my breath that they will do that.

## 2019-08-14 ENCOUNTER — Telehealth (HOSPITAL_COMMUNITY): Payer: Self-pay | Admitting: *Deleted

## 2019-08-14 NOTE — Telephone Encounter (Signed)
Writer spoke with Officer Shane Bullins regarding message from pt wife that he is not sleeping because last med pass is 1600 or 1700. Officer stated that there are only two med passes due to not having nurses 24 hours a day. Officer stated that he would have nurse call this writer back to discuss.

## 2019-08-14 NOTE — Telephone Encounter (Signed)
Nurse Rolan Bucco returned writers call regarding pt wife, [name], c/o pt being given sleep medication too early and pt c/o decreased sleep. Nurse informed this Probation officer that facility does not have enough nurses to cover 3 shifts so there are only two med passes per day and the only alternative is to d/c HS medications. Nurse not authorized to speak with pt, inmate, wife.

## 2019-09-01 ENCOUNTER — Encounter (HOSPITAL_COMMUNITY): Payer: Self-pay | Admitting: *Deleted

## 2019-10-23 NOTE — Telephone Encounter (Signed)
Opened in error

## 2019-12-08 ENCOUNTER — Other Ambulatory Visit: Payer: Self-pay

## 2019-12-08 ENCOUNTER — Telehealth (INDEPENDENT_AMBULATORY_CARE_PROVIDER_SITE_OTHER): Payer: Medicaid Other | Admitting: Psychiatry

## 2019-12-08 DIAGNOSIS — F411 Generalized anxiety disorder: Secondary | ICD-10-CM | POA: Diagnosis not present

## 2019-12-08 DIAGNOSIS — F1021 Alcohol dependence, in remission: Secondary | ICD-10-CM | POA: Diagnosis not present

## 2019-12-08 DIAGNOSIS — F3161 Bipolar disorder, current episode mixed, mild: Secondary | ICD-10-CM | POA: Diagnosis not present

## 2019-12-08 DIAGNOSIS — F1221 Cannabis dependence, in remission: Secondary | ICD-10-CM

## 2019-12-08 MED ORDER — HYDROXYZINE HCL 25 MG PO TABS: 25.0000 mg | ORAL_TABLET | Freq: Two times a day (BID) | ORAL | 2 refills | Status: DC | PRN
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
## 2020-01-08 ENCOUNTER — Ambulatory Visit: Payer: Medicaid Other | Admitting: Family Medicine

## 2020-02-03 ENCOUNTER — Ambulatory Visit: Payer: Medicaid Other | Admitting: Family Medicine

## 2020-02-03 ENCOUNTER — Other Ambulatory Visit: Payer: Self-pay

## 2020-02-03 ENCOUNTER — Encounter: Payer: Self-pay | Admitting: Family Medicine

## 2020-02-03 ENCOUNTER — Ambulatory Visit (INDEPENDENT_AMBULATORY_CARE_PROVIDER_SITE_OTHER): Payer: Medicaid Other

## 2020-02-03 VITALS — BP 125/77 | HR 77 | Temp 98.3°F | Ht 68.0 in | Wt 180.6 lb

## 2020-02-03 DIAGNOSIS — R531 Weakness: Secondary | ICD-10-CM

## 2020-02-03 DIAGNOSIS — E039 Hypothyroidism, unspecified: Secondary | ICD-10-CM | POA: Diagnosis not present

## 2020-02-03 DIAGNOSIS — F332 Major depressive disorder, recurrent severe without psychotic features: Secondary | ICD-10-CM

## 2020-02-03 DIAGNOSIS — M25511 Pain in right shoulder: Secondary | ICD-10-CM

## 2020-02-03 DIAGNOSIS — R200 Anesthesia of skin: Secondary | ICD-10-CM

## 2020-02-03 DIAGNOSIS — I10 Essential (primary) hypertension: Secondary | ICD-10-CM | POA: Diagnosis not present

## 2020-02-03 DIAGNOSIS — M542 Cervicalgia: Secondary | ICD-10-CM | POA: Diagnosis not present

## 2020-02-03 DIAGNOSIS — G8929 Other chronic pain: Secondary | ICD-10-CM | POA: Diagnosis not present

## 2020-02-03 DIAGNOSIS — Z862 Personal history of diseases of the blood and blood-forming organs and certain disorders involving the immune mechanism: Secondary | ICD-10-CM

## 2020-02-03 DIAGNOSIS — F411 Generalized anxiety disorder: Secondary | ICD-10-CM

## 2020-02-03 DIAGNOSIS — G4700 Insomnia, unspecified: Secondary | ICD-10-CM

## 2020-02-03 DIAGNOSIS — F3161 Bipolar disorder, current episode mixed, mild: Secondary | ICD-10-CM

## 2020-02-03 DIAGNOSIS — F1021 Alcohol dependence, in remission: Secondary | ICD-10-CM

## 2020-02-03 DIAGNOSIS — F1221 Cannabis dependence, in remission: Secondary | ICD-10-CM

## 2020-02-03 DIAGNOSIS — F4323 Adjustment disorder with mixed anxiety and depressed mood: Secondary | ICD-10-CM

## 2020-02-03 MED ORDER — METHOCARBAMOL 500 MG PO TABS: 500.0000 mg | ORAL_TABLET | Freq: Three times a day (TID) | ORAL | 1 refills | Status: DC | PRN

## 2020-02-03 NOTE — Progress Notes (Signed)
° °New Patient Office Visit ° °Assessment & Plan:  °1. Essential hypertension °- Well controlled on current regimen.  °- CMP14+EGFR ° °2. Hypothyroidism, unspecified type °- CMP14+EGFR °- TSH ° °3. Chronic neck pain °- DG Cervical Spine Complete °- Ambulatory referral to Physical Therapy ° °4. Musculoskeletal neck pain °- methocarbamol (ROBAXIN) 500 MG tablet; Take 1 tablet (500 mg total) by mouth 3 (three) times daily as needed for muscle spasms.  Dispense: 60 tablet; Refill: 1 °- Ambulatory referral to Physical Therapy ° °5. Generalized weakness °- Ambulatory referral to Physical Therapy ° °6. Chronic right shoulder pain °- Ambulatory referral to Physical Therapy ° °7. Numbness of both lower extremities °- CMP14+EGFR °- Vitamin B12 ° °8. History of anemia °- CBC with Differential/Platelet °- Vitamin B12 ° °9-15. Bipolar disorder, current episode mixed, mild (HCC)/Severe episode of recurrent major depressive disorder, without psychotic features (HCC)/Insomnia/Anxiety state/Adjustment disorder with mixed anxiety and depressed mood/Alcohol use disorder, severe, in sustained remission (HCC)/Cannabis use disorder, moderate, in early remission (HCC) °- Managed by psychiatrist.  °- CBC with Differential/Platelet °- CMP14+EGFR °- Lipid panel ° ° °Follow-up: Return in about 2 months (around 04/04/2020) for shoulder/neck pain.  ° ° , MSN, APRN, FNP-C °[facility] ° °Subjective:  °Patient ID: [name], male    DOB: 08/29/1960  Age: 59 y.o. MRN: [id number] ° °Patient Care Team: °,  F, FNP as PCP - General (Family Medicine) °Kaplan, Kristen W., PA-C (Family Medicine) ° °CC:  °Chief Complaint  °Patient presents with  °• New Patient (Initial Visit)  °  Dr. Kaplan [place]   °• Establish Care  ° ° °HPI °[name] presents to establish care.  ° °Patient has a psychiatrist, Dr. Pucilowski, that manages his adjustment disorder with anxiety and depression, alcohol use  disorder, cannabis use disorder, bipolar disorder, and insomnia.  With a prescribe carbamazepine, hydroxyzine, Seroquel, and sertraline. ° °Hypertension and hypothyroidism was previously managed by his PCP.  He reports he has been on the same dose of levothyroxine for the past year. ° °Patient has concerns today regarding right shoulder pain x1 year, neck pain x8 months to 1 year, and decreased endurance.  Patient reports he has been in and out of hospitals, rehabilitation centers, and jail which have resulted in weakness/decreased strength.  He does also report his legs feel numb. ° ° °Review of Systems  °Constitutional: Negative for chills, fever, malaise/fatigue and weight loss.  °HENT: Negative for congestion, ear discharge, ear pain, nosebleeds, sinus pain, sore throat and tinnitus.   °Eyes: Negative for blurred vision, double vision, pain, discharge and redness.  °Respiratory: Negative for cough, shortness of breath and wheezing.   °Cardiovascular: Negative for chest pain, palpitations and leg swelling.  °Gastrointestinal: Negative for abdominal pain, constipation, diarrhea, heartburn, nausea and vomiting.  °Genitourinary: Negative for dysuria, frequency and urgency.  °Musculoskeletal: Positive for joint pain and neck pain. Negative for myalgias.  °Skin: Negative for rash.  °Neurological: Positive for weakness. Negative for dizziness, seizures and headaches.  °Psychiatric/Behavioral: Positive for depression. Negative for substance abuse and suicidal ideas. The patient is nervous/anxious and has insomnia.   ° ° °Current Outpatient Medications:  °•  amLODipine (NORVASC) 5 MG tablet, Take 1 tablet (5 mg total) by mouth daily., Disp: 30 tablet, Rfl: 0 °•  hydrOXYzine (ATARAX/VISTARIL) 25 MG tablet, Take 1 tablet (25 mg total) by mouth 2 (two) times daily as needed for anxiety., Disp: 60 tablet, Rfl: 2 °•  levothyroxine (SYNTHROID) 175 MCG tablet, Take   1 tablet (175 mcg total) by mouth daily at 6 (six) AM., Disp:  30 tablet, Rfl: 0 °•  carbamazepine (TEGRETOL) 200 MG tablet, Take 1 tablet (200 mg total) by mouth 2 (two) times daily., Disp: 180 tablet, Rfl: 0 °•  QUEtiapine (SEROQUEL) 50 MG tablet, Take 1 tablet (50 mg total) by mouth at bedtime., Disp: 90 tablet, Rfl: 0 °•  sertraline (ZOLOFT) 100 MG tablet, Take 1 tablet (100 mg total) by mouth daily., Disp: 90 tablet, Rfl: 0 °No current facility-administered medications for this visit. ° °Facility-Administered Medications Ordered in Other Visits:  °•  gadopentetate dimeglumine (MAGNEVIST) injection 15 mL, 15 mL, Intravenous, Once PRN, Sater, Richard A, MD ° °Allergies  °Allergen Reactions  °• Benadryl [Diphenhydramine] Other (See Comments)  ° ° °Past Medical History:  °Diagnosis Date  °• Anxiety   °• Anxiety and depression   °• Depression   °• Hemorrhoids   °• Hyperlipidemia 02/06/2020  °• Hypertension   °• Hypothyroidism   °• OSA on CPAP   ° doesn't use often pt states   °• Seizures (HCC) 02/2016  °• Suicide and self-inflicted injury (HCC)   °• Vitamin B12 deficiency 02/06/2020  ° ° °Past Surgical History:  °Procedure Laterality Date  °• BICEPS TENDON REPAIR Right   °• CARPAL TUNNEL RELEASE Bilateral   °• DECORTICATION Left 11/12/2018  ° Procedure: DECORTICATION;  Surgeon: Van Trigt, Peter, MD;  Location: [facility] OR;  Service: Thoracic;  Laterality: Left;  °• KNEE CARTILAGE SURGERY Left   °• KNEE SURGERY Right   ° x 2  °• LAPAROTOMY N/A 11/03/2018  ° Procedure: EXPLORATORY LAPAROTOMY;  Surgeon: Cornett, Thomas, MD;  Location: [facility] OR;  Service: General;  Laterality: N/A;  °• VIDEO ASSISTED THORACOSCOPY (VATS)/EMPYEMA Left 11/12/2018  ° Procedure: VIDEO ASSISTED THORACOSCOPY (VATS)/EMPYEMA, MINI THORACOTOMY;  Surgeon: Van Trigt, Peter, MD;  Location: [facility] OR;  Service: Thoracic;  Laterality: Left;  needs cell saver  ° ° °Family History  °Problem Relation Age of Onset  °• Kidney disease Mother   °• Hypertension Father   °• Multiple sclerosis Sister   °• Stroke Brother   °• Breast cancer  Paternal Grandmother   °• Kidney failure Maternal Grandmother   °• Kidney disease Maternal Grandmother   °• Heart defect Maternal Uncle   ° ° °Social History  ° °Socioeconomic History  °• Marital status: Married  °  Spouse name: Donna  °• Number of children: 1  °• Years of education: 12  °• Highest education level: 12th grade  °Occupational History  °• Occupation: construction  °  Comment: remodels houses  °• Occupation: insealation installer   °  Comment: quit last week   °Tobacco Use  °• Smoking status: Current Every Day Smoker  °  Packs/day: 1.50  °  Years: 51.00  °  Pack years: 76.50  °  Types: Cigarettes  °  Start date: 1969  °• Smokeless tobacco: Never Used  °Vaping Use  °• Vaping Use: Never used  °Substance and Sexual Activity  °• Alcohol use: Not Currently  °  Comment: none since March 2020  °• Drug use: Not Currently  °  Frequency: 3.0 times per week  °  Types: Marijuana  °  Comment: 02/03/20- states no   °• Sexual activity: Not Currently  °  Partners: Female  °  Birth control/protection: None  °Other Topics Concern  °• Not on file  °Social History Narrative  ° ** Merged History Encounter **  °    ° Lives   Lives with wife Caffeine- coffee 2 cups daily   Social Determinants of Health   Financial Resource Strain:    Difficulty of Paying Living Expenses:   Food Insecurity:    Worried About Charity fundraiser in the Last Year:    Arboriculturist in the Last Year:   Transportation Needs:    Film/video editor (Medical):    Lack of Transportation (Non-Medical):   Physical Activity:    Days of Exercise per Week:    Minutes of Exercise per Session:   Stress:    Feeling of Stress :   Social Connections:    Frequency of Communication with Friends and Family:    Frequency of Social Gatherings with Friends and Family:    Attends Religious Services:    Active Member of Clubs or Organizations:    Attends Music therapist:    Marital Status:   Intimate Partner Violence:      Fear of Current or Ex-Partner:    Emotionally Abused:    Physically Abused:    Sexually Abused:     Objective:   Today's Vitals: BP 125/77    Pulse 77    Temp 98.3 F (36.8 C) (Temporal)    Ht 5' 8" (1.727 m)    Wt 180 lb 9.6 oz (81.9 kg)    SpO2 96%    BMI 27.46 kg/m   Physical Exam Vitals reviewed.  Constitutional:      General: He is not in acute distress.    Appearance: Normal appearance. He is not ill-appearing, toxic-appearing or diaphoretic.  HENT:     Head: Normocephalic and atraumatic.  Eyes:     General: No scleral icterus.       Right eye: No discharge.        Left eye: No discharge.     Conjunctiva/sclera: Conjunctivae normal.  Cardiovascular:     Rate and Rhythm: Normal rate and regular rhythm.     Heart sounds: Normal heart sounds. No murmur heard.  No friction rub. No gallop.   Pulmonary:     Effort: Pulmonary effort is normal. No respiratory distress.     Breath sounds: Normal breath sounds. No stridor. No wheezing, rhonchi or rales.  Musculoskeletal:     Cervical back: Muscular tenderness present. Decreased range of motion (~45 degrees left and right is all he can turn his head).  Skin:    General: Skin is warm and dry.  Neurological:     Mental Status: He is alert and oriented to person, place, and time. Mental status is at baseline.  Psychiatric:        Mood and Affect: Mood normal.        Behavior: Behavior normal.        Thought Content: Thought content normal.        Judgment: Judgment normal.

## 2020-02-04 LAB — CBC WITH DIFFERENTIAL/PLATELET
Basophils Absolute: 0.1 10*3/uL (ref 0.0–0.2)
Basos: 1 %
EOS (ABSOLUTE): 0.2 10*3/uL (ref 0.0–0.4)
Eos: 2 %
Hematocrit: 42.9 % (ref 37.5–51.0)
Hemoglobin: 15 g/dL (ref 13.0–17.7)
Immature Grans (Abs): 0 10*3/uL (ref 0.0–0.1)
Immature Granulocytes: 0 %
Lymphocytes Absolute: 2.6 10*3/uL (ref 0.7–3.1)
Lymphs: 28 %
MCH: 32.9 pg (ref 26.6–33.0)
MCHC: 35 g/dL (ref 31.5–35.7)
MCV: 94 fL (ref 79–97)
Monocytes Absolute: 0.8 10*3/uL (ref 0.1–0.9)
Monocytes: 9 %
Neutrophils Absolute: 5.7 10*3/uL (ref 1.4–7.0)
Neutrophils: 60 %
Platelets: 257 10*3/uL (ref 150–450)
RBC: 4.56 x10E6/uL (ref 4.14–5.80)
RDW: 13.3 % (ref 11.6–15.4)
WBC: 9.3 10*3/uL (ref 3.4–10.8)

## 2020-02-04 LAB — CMP14+EGFR
ALT: 6 IU/L (ref 0–44)
AST: 11 IU/L (ref 0–40)
Albumin/Globulin Ratio: 1.6 (ref 1.2–2.2)
Albumin: 4.6 g/dL (ref 3.8–4.9)
Alkaline Phosphatase: 158 IU/L — ABNORMAL HIGH (ref 48–121)
BUN/Creatinine Ratio: 15 (ref 9–20)
BUN: 14 mg/dL (ref 6–24)
Bilirubin Total: 0.2 mg/dL (ref 0.0–1.2)
CO2: 25 mmol/L (ref 20–29)
Calcium: 9.8 mg/dL (ref 8.7–10.2)
Chloride: 100 mmol/L (ref 96–106)
Creatinine, Ser: 0.93 mg/dL (ref 0.76–1.27)
GFR calc Af Amer: 104 mL/min/{1.73_m2} (ref >59)
GFR calc non Af Amer: 90 mL/min/{1.73_m2} (ref >59)
Globulin, Total: 2.9 g/dL (ref 1.5–4.5)
Glucose: 83 mg/dL (ref 65–99)
Potassium: 4.6 mmol/L (ref 3.5–5.2)
Sodium: 143 mmol/L (ref 134–144)
Total Protein: 7.5 g/dL (ref 6.0–8.5)

## 2020-02-04 LAB — LIPID PANEL
Chol/HDL Ratio: 6.6 ratio — ABNORMAL HIGH (ref 0.0–5.0)
Cholesterol, Total: 249 mg/dL — ABNORMAL HIGH (ref 100–199)
HDL: 38 mg/dL — ABNORMAL LOW (ref >39)
LDL Chol Calc (NIH): 164 mg/dL — ABNORMAL HIGH (ref 0–99)
Triglycerides: 250 mg/dL — ABNORMAL HIGH (ref 0–149)
VLDL Cholesterol Cal: 47 mg/dL — ABNORMAL HIGH (ref 5–40)

## 2020-02-04 LAB — TSH: TSH: 3.39 u[IU]/mL (ref 0.450–4.500)

## 2020-02-04 LAB — VITAMIN B12: Vitamin B-12: 198 pg/mL — ABNORMAL LOW (ref 232–1245)

## 2020-02-06 ENCOUNTER — Other Ambulatory Visit: Payer: Self-pay | Admitting: Family Medicine

## 2020-02-06 ENCOUNTER — Encounter: Payer: Self-pay | Admitting: Family Medicine

## 2020-02-06 DIAGNOSIS — E785 Hyperlipidemia, unspecified: Secondary | ICD-10-CM

## 2020-02-06 DIAGNOSIS — E538 Deficiency of other specified B group vitamins: Secondary | ICD-10-CM

## 2020-02-06 HISTORY — DX: Hyperlipidemia, unspecified: E78.5

## 2020-02-06 HISTORY — DX: Deficiency of other specified B group vitamins: E53.8

## 2020-02-06 MED ORDER — ATORVASTATIN CALCIUM 10 MG PO TABS: 10.0000 mg | ORAL_TABLET | Freq: Every day | ORAL | 2 refills | Status: DC

## 2020-02-09 DIAGNOSIS — M542 Cervicalgia: Secondary | ICD-10-CM | POA: Insufficient documentation

## 2020-02-09 DIAGNOSIS — G8929 Other chronic pain: Secondary | ICD-10-CM | POA: Insufficient documentation

## 2020-02-11 ENCOUNTER — Other Ambulatory Visit: Payer: Self-pay

## 2020-02-11 ENCOUNTER — Telehealth (INDEPENDENT_AMBULATORY_CARE_PROVIDER_SITE_OTHER): Payer: Medicaid Other | Admitting: Psychiatry

## 2020-02-11 DIAGNOSIS — F1021 Alcohol dependence, in remission: Secondary | ICD-10-CM

## 2020-02-11 DIAGNOSIS — F1221 Cannabis dependence, in remission: Secondary | ICD-10-CM | POA: Diagnosis not present

## 2020-02-11 DIAGNOSIS — F3170 Bipolar disorder, currently in remission, most recent episode unspecified: Secondary | ICD-10-CM

## 2020-02-11 MED ORDER — HYDROXYZINE HCL 25 MG PO TABS: 25.0000 mg | ORAL_TABLET | Freq: Two times a day (BID) | ORAL | 2 refills | Status: DC | PRN

## 2020-02-11 MED ORDER — SERTRALINE HCL 100 MG PO TABS: 100.0000 mg | ORAL_TABLET | Freq: Every day | ORAL | 0 refills | Status: DC

## 2020-02-11 MED ORDER — QUETIAPINE FUMARATE 50 MG PO TABS: 50.0000 mg | ORAL_TABLET | Freq: Every day | ORAL | 0 refills | Status: DC

## 2020-02-11 MED ORDER — CARBAMAZEPINE 200 MG PO TABS: 200.0000 mg | ORAL_TABLET | Freq: Two times a day (BID) | ORAL | 0 refills | Status: DC
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
## 2020-02-26 ENCOUNTER — Ambulatory Visit (HOSPITAL_COMMUNITY): Payer: Medicaid Other | Attending: Family Medicine | Admitting: Physical Therapy

## 2020-02-26 ENCOUNTER — Encounter (HOSPITAL_COMMUNITY): Payer: Self-pay | Admitting: Physical Therapy

## 2020-02-26 ENCOUNTER — Other Ambulatory Visit: Payer: Self-pay

## 2020-02-26 DIAGNOSIS — R2689 Other abnormalities of gait and mobility: Secondary | ICD-10-CM | POA: Insufficient documentation

## 2020-02-26 DIAGNOSIS — G8929 Other chronic pain: Secondary | ICD-10-CM | POA: Insufficient documentation

## 2020-02-26 DIAGNOSIS — M542 Cervicalgia: Secondary | ICD-10-CM

## 2020-02-26 DIAGNOSIS — M25511 Pain in right shoulder: Secondary | ICD-10-CM | POA: Insufficient documentation

## 2020-02-26 DIAGNOSIS — R296 Repeated falls: Secondary | ICD-10-CM | POA: Insufficient documentation
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
## 2020-03-12 ENCOUNTER — Encounter (HOSPITAL_COMMUNITY): Payer: Self-pay | Admitting: Physical Therapy

## 2020-03-12 ENCOUNTER — Ambulatory Visit (HOSPITAL_COMMUNITY): Payer: Medicaid Other | Attending: Family Medicine | Admitting: Physical Therapy

## 2020-03-12 ENCOUNTER — Other Ambulatory Visit: Payer: Self-pay

## 2020-03-12 DIAGNOSIS — R2689 Other abnormalities of gait and mobility: Secondary | ICD-10-CM | POA: Insufficient documentation

## 2020-03-12 DIAGNOSIS — M25511 Pain in right shoulder: Secondary | ICD-10-CM | POA: Diagnosis present

## 2020-03-12 DIAGNOSIS — M542 Cervicalgia: Secondary | ICD-10-CM

## 2020-03-12 DIAGNOSIS — R296 Repeated falls: Secondary | ICD-10-CM | POA: Diagnosis not present

## 2020-03-12 DIAGNOSIS — G8929 Other chronic pain: Secondary | ICD-10-CM | POA: Diagnosis present
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
## 2020-03-17 ENCOUNTER — Ambulatory Visit (HOSPITAL_COMMUNITY): Payer: Medicaid Other | Admitting: Physical Therapy

## 2020-03-17 ENCOUNTER — Encounter (HOSPITAL_COMMUNITY): Payer: Self-pay | Admitting: Physical Therapy

## 2020-03-17 ENCOUNTER — Other Ambulatory Visit: Payer: Self-pay

## 2020-03-17 DIAGNOSIS — G8929 Other chronic pain: Secondary | ICD-10-CM

## 2020-03-17 DIAGNOSIS — R2689 Other abnormalities of gait and mobility: Secondary | ICD-10-CM

## 2020-03-17 DIAGNOSIS — R296 Repeated falls: Secondary | ICD-10-CM

## 2020-03-17 DIAGNOSIS — M25511 Pain in right shoulder: Secondary | ICD-10-CM

## 2020-03-17 DIAGNOSIS — M542 Cervicalgia: Secondary | ICD-10-CM

## 2020-03-17 NOTE — Patient Instructions (Signed)
Access Code: [email or web address] URL: [email or web address] Date: 03/17/2020 Prepared by: Mitzi Hansen Delbert Vu  Exercises Side Stepping with Resistance at [employer] - 1 x daily - 7 x weekly - 6 sets - 15 reps
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
## 2020-03-19 ENCOUNTER — Telehealth (HOSPITAL_COMMUNITY): Payer: Self-pay

## 2020-03-19 ENCOUNTER — Ambulatory Visit (HOSPITAL_COMMUNITY): Payer: Medicaid Other

## 2020-03-19 NOTE — Telephone Encounter (Signed)
L/m-MEDICAID HAS NOT BEEN APPROVED FOR THIS VISIT 8/13 it has been cx

## 2020-03-22 ENCOUNTER — Ambulatory Visit (HOSPITAL_COMMUNITY): Payer: Medicaid Other | Admitting: Physical Therapy

## 2020-03-22 ENCOUNTER — Other Ambulatory Visit: Payer: Self-pay

## 2020-03-22 DIAGNOSIS — G8929 Other chronic pain: Secondary | ICD-10-CM

## 2020-03-22 DIAGNOSIS — R296 Repeated falls: Secondary | ICD-10-CM

## 2020-03-22 DIAGNOSIS — R2689 Other abnormalities of gait and mobility: Secondary | ICD-10-CM

## 2020-03-22 DIAGNOSIS — M542 Cervicalgia: Secondary | ICD-10-CM
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
## 2020-03-26 ENCOUNTER — Other Ambulatory Visit: Payer: Self-pay

## 2020-03-26 ENCOUNTER — Encounter (HOSPITAL_COMMUNITY): Payer: Self-pay

## 2020-03-26 ENCOUNTER — Ambulatory Visit (HOSPITAL_COMMUNITY): Payer: Medicaid Other

## 2020-03-26 DIAGNOSIS — M542 Cervicalgia: Secondary | ICD-10-CM

## 2020-03-26 DIAGNOSIS — R2689 Other abnormalities of gait and mobility: Secondary | ICD-10-CM

## 2020-03-26 DIAGNOSIS — G8929 Other chronic pain: Secondary | ICD-10-CM

## 2020-03-26 DIAGNOSIS — R296 Repeated falls: Secondary | ICD-10-CM | POA: Diagnosis not present

## 2020-03-26 DIAGNOSIS — M25511 Pain in right shoulder: Secondary | ICD-10-CM
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
## 2020-03-29 ENCOUNTER — Ambulatory Visit (HOSPITAL_COMMUNITY): Payer: Medicaid Other | Admitting: Physical Therapy

## 2020-03-29 ENCOUNTER — Other Ambulatory Visit: Payer: Self-pay

## 2020-03-29 DIAGNOSIS — R296 Repeated falls: Secondary | ICD-10-CM | POA: Diagnosis not present

## 2020-03-29 DIAGNOSIS — M542 Cervicalgia: Secondary | ICD-10-CM

## 2020-03-29 DIAGNOSIS — G8929 Other chronic pain: Secondary | ICD-10-CM
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
## 2020-03-31 ENCOUNTER — Other Ambulatory Visit: Payer: Self-pay

## 2020-03-31 ENCOUNTER — Ambulatory Visit (HOSPITAL_COMMUNITY): Payer: Medicaid Other | Admitting: Physical Therapy

## 2020-03-31 DIAGNOSIS — R2689 Other abnormalities of gait and mobility: Secondary | ICD-10-CM

## 2020-03-31 DIAGNOSIS — R296 Repeated falls: Secondary | ICD-10-CM

## 2020-03-31 DIAGNOSIS — G8929 Other chronic pain: Secondary | ICD-10-CM

## 2020-03-31 DIAGNOSIS — M542 Cervicalgia: Secondary | ICD-10-CM
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
## 2020-04-05 ENCOUNTER — Other Ambulatory Visit: Payer: Self-pay

## 2020-04-05 ENCOUNTER — Ambulatory Visit (HOSPITAL_COMMUNITY): Payer: Medicaid Other | Admitting: Physical Therapy

## 2020-04-05 DIAGNOSIS — R296 Repeated falls: Secondary | ICD-10-CM | POA: Diagnosis not present

## 2020-04-05 DIAGNOSIS — M25511 Pain in right shoulder: Secondary | ICD-10-CM

## 2020-04-05 DIAGNOSIS — R2689 Other abnormalities of gait and mobility: Secondary | ICD-10-CM

## 2020-04-05 DIAGNOSIS — M542 Cervicalgia: Secondary | ICD-10-CM
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
## 2020-04-05 NOTE — Patient Instructions (Signed)
Flexibility: Neck Stretch    Grasp left arm above wrist and pull down across body while gently tilting head same direction. Hold ___10_ seconds. Relax. Repeat 5____ times per set. Do __1__ sets per session. Do __2__ sessions per day.  http://orth.exer.us/346   Copyright  VHI. All rights reserved.

## 2020-04-08 ENCOUNTER — Ambulatory Visit (HOSPITAL_COMMUNITY): Payer: Medicaid Other | Attending: Family Medicine | Admitting: Physical Therapy

## 2020-04-08 ENCOUNTER — Encounter (HOSPITAL_COMMUNITY): Payer: Self-pay | Admitting: Physical Therapy

## 2020-04-08 ENCOUNTER — Other Ambulatory Visit: Payer: Self-pay

## 2020-04-08 DIAGNOSIS — R2689 Other abnormalities of gait and mobility: Secondary | ICD-10-CM

## 2020-04-08 DIAGNOSIS — M25511 Pain in right shoulder: Secondary | ICD-10-CM | POA: Diagnosis present

## 2020-04-08 DIAGNOSIS — G8929 Other chronic pain: Secondary | ICD-10-CM

## 2020-04-08 DIAGNOSIS — M542 Cervicalgia: Secondary | ICD-10-CM | POA: Diagnosis present

## 2020-04-08 DIAGNOSIS — R296 Repeated falls: Secondary | ICD-10-CM
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
## 2020-04-13 ENCOUNTER — Other Ambulatory Visit: Payer: Self-pay

## 2020-04-13 ENCOUNTER — Telehealth (INDEPENDENT_AMBULATORY_CARE_PROVIDER_SITE_OTHER): Payer: Medicaid Other | Admitting: Psychiatry

## 2020-04-13 ENCOUNTER — Other Ambulatory Visit (HOSPITAL_COMMUNITY): Payer: Self-pay | Admitting: *Deleted

## 2020-04-13 ENCOUNTER — Encounter (HOSPITAL_COMMUNITY): Payer: Medicaid Other | Admitting: Physical Therapy

## 2020-04-13 DIAGNOSIS — F1021 Alcohol dependence, in remission: Secondary | ICD-10-CM | POA: Diagnosis not present

## 2020-04-13 DIAGNOSIS — F411 Generalized anxiety disorder: Secondary | ICD-10-CM | POA: Diagnosis not present

## 2020-04-13 DIAGNOSIS — F3170 Bipolar disorder, currently in remission, most recent episode unspecified: Secondary | ICD-10-CM

## 2020-04-13 DIAGNOSIS — F1221 Cannabis dependence, in remission: Secondary | ICD-10-CM

## 2020-04-13 MED ORDER — HYDROXYZINE HCL 25 MG PO TABS: 25.0000 mg | ORAL_TABLET | Freq: Two times a day (BID) | ORAL | 2 refills | Status: DC | PRN

## 2020-04-13 MED ORDER — SERTRALINE HCL 100 MG PO TABS: 100.0000 mg | ORAL_TABLET | Freq: Every day | ORAL | 0 refills | Status: DC

## 2020-04-13 MED ORDER — QUETIAPINE FUMARATE 100 MG PO TABS: 100.0000 mg | ORAL_TABLET | Freq: Every day | ORAL | 0 refills | Status: DC

## 2020-04-13 MED ORDER — CARBAMAZEPINE 200 MG PO TABS: 200.0000 mg | ORAL_TABLET | Freq: Two times a day (BID) | ORAL | 0 refills | Status: DC
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
## 2020-04-15 ENCOUNTER — Encounter (HOSPITAL_COMMUNITY): Payer: Medicaid Other | Admitting: Physical Therapy

## 2020-04-17 LAB — CARBAMAZEPINE LEVEL, TOTAL: Carbamazepine (Tegretol), S: 9.3 ug/mL (ref 4.0–12.0)

## 2020-04-20 ENCOUNTER — Encounter (HOSPITAL_COMMUNITY): Payer: Medicaid Other | Admitting: Physical Therapy

## 2020-04-22 ENCOUNTER — Encounter (HOSPITAL_COMMUNITY): Payer: Medicaid Other | Admitting: Physical Therapy

## 2020-04-26 ENCOUNTER — Other Ambulatory Visit: Payer: Self-pay | Admitting: Family Medicine

## 2020-04-26 DIAGNOSIS — M542 Cervicalgia: Secondary | ICD-10-CM

## 2020-05-09 ENCOUNTER — Other Ambulatory Visit (HOSPITAL_COMMUNITY): Payer: Self-pay | Admitting: Psychiatry

## 2020-06-17 ENCOUNTER — Encounter: Payer: Self-pay | Admitting: Family

## 2020-06-17 ENCOUNTER — Other Ambulatory Visit: Payer: Self-pay

## 2020-06-17 ENCOUNTER — Ambulatory Visit (INDEPENDENT_AMBULATORY_CARE_PROVIDER_SITE_OTHER): Payer: Medicaid Other | Admitting: Family

## 2020-06-17 VITALS — BP 137/81 | HR 68 | Temp 97.7°F | Ht 68.0 in | Wt 181.0 lb

## 2020-06-17 DIAGNOSIS — M159 Polyosteoarthritis, unspecified: Secondary | ICD-10-CM | POA: Diagnosis not present

## 2020-06-17 DIAGNOSIS — G8929 Other chronic pain: Secondary | ICD-10-CM

## 2020-06-17 DIAGNOSIS — F332 Major depressive disorder, recurrent severe without psychotic features: Secondary | ICD-10-CM

## 2020-06-17 DIAGNOSIS — M542 Cervicalgia: Secondary | ICD-10-CM | POA: Diagnosis not present

## 2020-06-17 DIAGNOSIS — M25561 Pain in right knee: Secondary | ICD-10-CM | POA: Diagnosis not present

## 2020-06-17 DIAGNOSIS — Z87828 Personal history of other (healed) physical injury and trauma: Secondary | ICD-10-CM

## 2020-06-17 DIAGNOSIS — M25562 Pain in left knee: Secondary | ICD-10-CM

## 2020-06-17 DIAGNOSIS — M25512 Pain in left shoulder: Secondary | ICD-10-CM

## 2020-06-17 DIAGNOSIS — T1491XA Suicide attempt, initial encounter: Secondary | ICD-10-CM

## 2020-06-17 DIAGNOSIS — M25511 Pain in right shoulder: Secondary | ICD-10-CM | POA: Diagnosis not present

## 2020-06-17 DIAGNOSIS — F17200 Nicotine dependence, unspecified, uncomplicated: Secondary | ICD-10-CM

## 2020-06-17 MED ORDER — PREDNISONE 10 MG (21) PO TBPK: ORAL_TABLET | ORAL | 0 refills | Status: DC

## 2020-06-17 MED ORDER — DICLOFENAC SODIUM 75 MG PO TBEC: 75.0000 mg | DELAYED_RELEASE_TABLET | Freq: Two times a day (BID) | ORAL | 0 refills | Status: DC
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
## 2020-06-17 NOTE — Patient Instructions (Signed)

## 2020-06-18 ENCOUNTER — Telehealth: Payer: Self-pay | Admitting: *Deleted

## 2020-06-18 NOTE — Telephone Encounter (Signed)
PA started for Diclofenac.  Confirmation #:[id number] WPrior Approval #:[id number] Status:APPROVED 06/18/2020 - 12/15/2020

## 2020-06-22 ENCOUNTER — Telehealth: Payer: Self-pay

## 2020-06-22 MED ORDER — AMLODIPINE BESYLATE 5 MG PO TABS: 5.0000 mg | ORAL_TABLET | Freq: Every day | ORAL | 0 refills | Status: DC

## 2020-06-22 NOTE — Telephone Encounter (Signed)
  Prescription Request  06/22/2020  What is the name of the medication or equipment? amLODipine (NORVASC) 5 MG tablet  Have you contacted your pharmacy to request a refill? (if applicable) no NEW RX  Which pharmacy would you like this sent to? [facility]   Patient notified that their request is being sent to the clinical staff for review and that they should receive a response within 2 business days.

## 2020-06-22 NOTE — Telephone Encounter (Signed)
Ok to refill Amlodipine? It is on his current med list but we haven't rx'd it?

## 2020-07-06 ENCOUNTER — Other Ambulatory Visit: Payer: Self-pay

## 2020-07-06 ENCOUNTER — Encounter: Payer: Self-pay | Admitting: Orthopedic Surgery

## 2020-07-06 ENCOUNTER — Ambulatory Visit (INDEPENDENT_AMBULATORY_CARE_PROVIDER_SITE_OTHER): Payer: Medicaid Other | Admitting: Orthopedic Surgery

## 2020-07-06 VITALS — BP 149/90 | HR 77 | Ht 68.0 in | Wt 180.0 lb

## 2020-07-06 DIAGNOSIS — G959 Disease of spinal cord, unspecified: Secondary | ICD-10-CM
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
## 2020-07-13 ENCOUNTER — Telehealth (INDEPENDENT_AMBULATORY_CARE_PROVIDER_SITE_OTHER): Payer: Medicaid Other | Admitting: Psychiatry

## 2020-07-13 ENCOUNTER — Other Ambulatory Visit: Payer: Self-pay

## 2020-07-13 DIAGNOSIS — F411 Generalized anxiety disorder: Secondary | ICD-10-CM

## 2020-07-13 DIAGNOSIS — F1021 Alcohol dependence, in remission: Secondary | ICD-10-CM

## 2020-07-13 DIAGNOSIS — F1221 Cannabis dependence, in remission: Secondary | ICD-10-CM | POA: Diagnosis not present

## 2020-07-13 DIAGNOSIS — F3170 Bipolar disorder, currently in remission, most recent episode unspecified: Secondary | ICD-10-CM | POA: Diagnosis not present

## 2020-07-13 MED ORDER — SERTRALINE HCL 100 MG PO TABS: 100.0000 mg | ORAL_TABLET | Freq: Every day | ORAL | 1 refills | Status: DC

## 2020-07-13 MED ORDER — HYDROXYZINE HCL 25 MG PO TABS: 25.0000 mg | ORAL_TABLET | Freq: Two times a day (BID) | ORAL | 2 refills | Status: DC | PRN

## 2020-07-13 MED ORDER — CARBAMAZEPINE 200 MG PO TABS
ORAL_TABLET | ORAL | 1 refills | Status: DC
Start: 2020-08-09 — End: 2020-08-11

## 2020-07-13 MED ORDER — QUETIAPINE FUMARATE ER 150 MG PO TB24: 150.0000 mg | ORAL_TABLET | Freq: Every day | ORAL | 5 refills | Status: DC
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
## 2020-07-16 ENCOUNTER — Other Ambulatory Visit: Payer: Self-pay

## 2020-07-16 ENCOUNTER — Ambulatory Visit (HOSPITAL_COMMUNITY)
Admission: RE | Admit: 2020-07-16 | Discharge: 2020-07-16 | Disposition: A | Discharge status: Home / Self Care | Payer: Medicaid Other | Source: Ambulatory Visit | Attending: Orthopedic Surgery | Admitting: Orthopedic Surgery

## 2020-07-16 DIAGNOSIS — G959 Disease of spinal cord, unspecified: Secondary | ICD-10-CM | POA: Diagnosis present

## 2020-07-16 IMAGING — MR MR CERVICAL SPINE W/O CM
5 series · 38 of 48 positions shown · non-contrast
Comparison: Plain film [DATE].

CLINICAL DATA: Severe neck pain.  Myelopathy.

EXAM:
MRI CERVICAL SPINE WITHOUT CONTRAST
TECHNIQUE: Multiplanar, multisequence MR imaging of the cervical spine was
performed. No intravenous contrast was administered.

[Series 5: T2 · sagittal · 3.0mm · 0.69mm/px · 7 of 15 slices shown (1 of 2)]
[im 1/15]
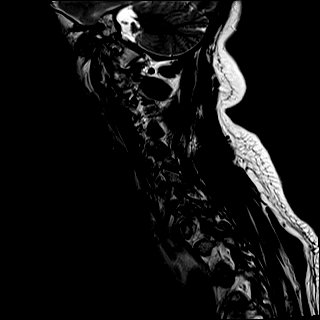
[im 3/15]
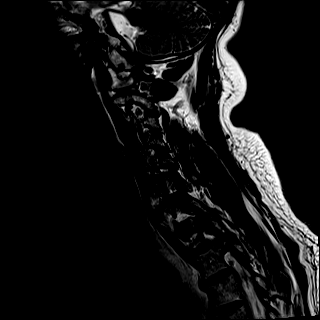
[im 5/15]
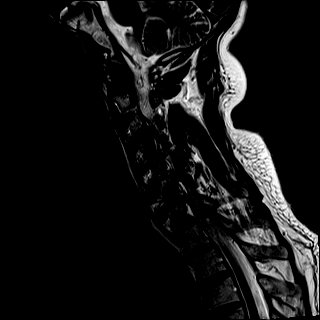
[im 8/15]
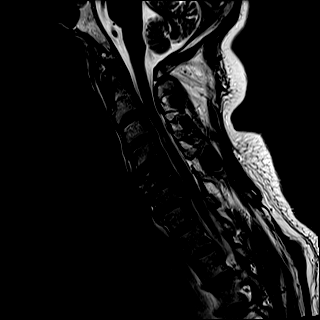
[im 10/15]
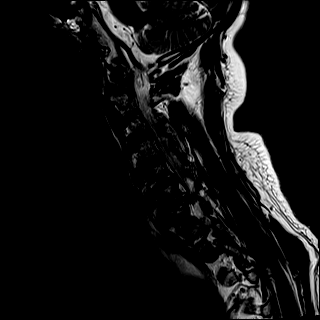
[im 12/15]
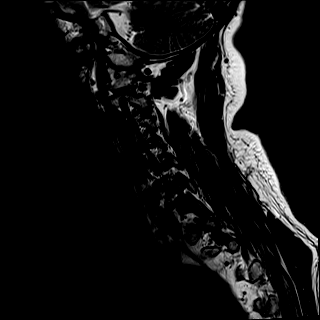
[im 15/15]
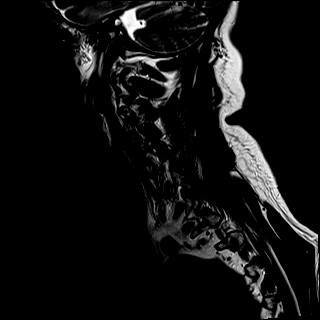

[Series 6: T1 · sagittal · 3.0mm · 0.86mm/px · 7 of 15 slices shown]
[im 1/15]
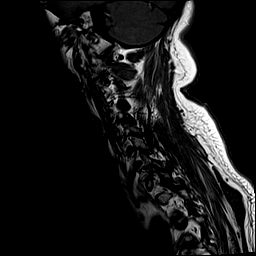
[im 3/15]
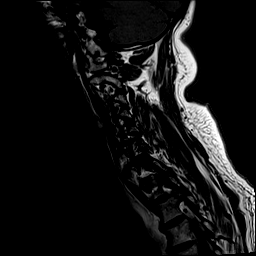
[im 5/15]
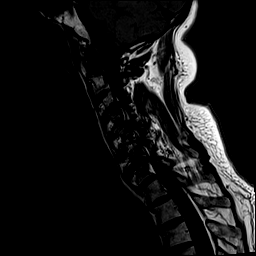
[im 8/15]
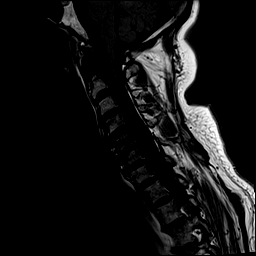
[im 10/15]
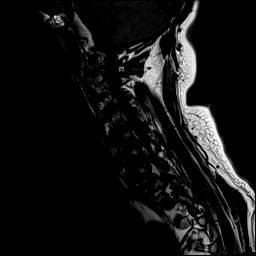
[im 12/15]
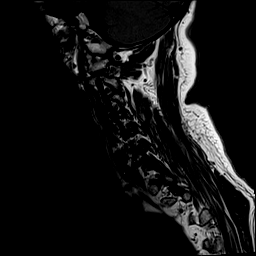
[im 15/15]
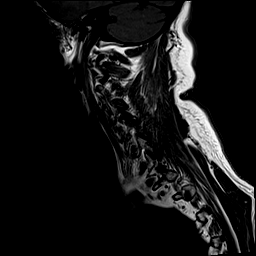

[Series 7: STIR · sagittal · 3.0mm · 0.69mm/px · 6 of 15 slices shown]
[im 1/15]
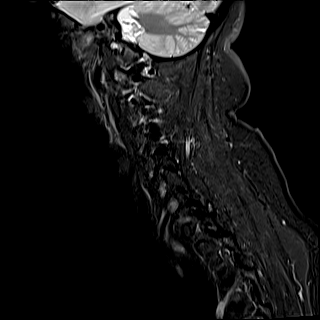
[im 3/15]
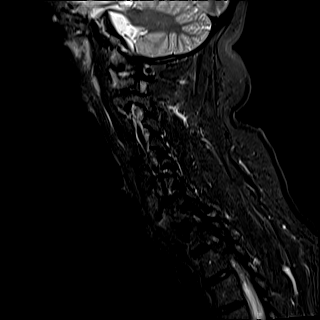
[im 6/15]
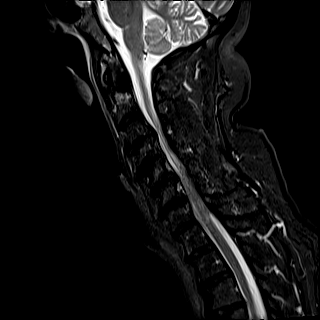
[im 9/15]
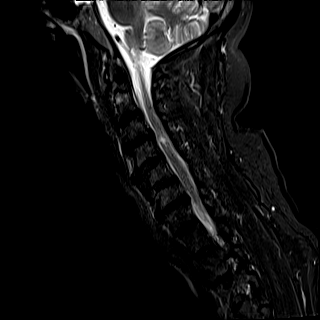
[im 12/15]
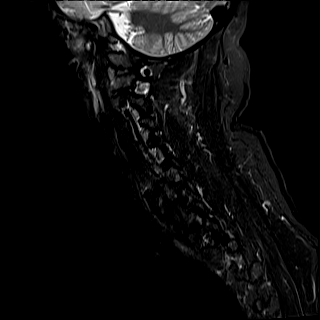
[im 15/15]
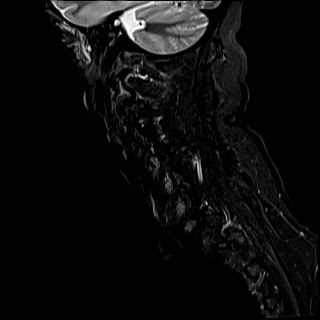

[Series 8: T2 · axial · 3.0mm · 0.70mm/px · z∈[-54,+42]mm · 10 of 34 slices shown (2 of 2)]
[im 1/34]
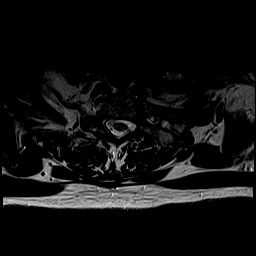
[im 3/34]
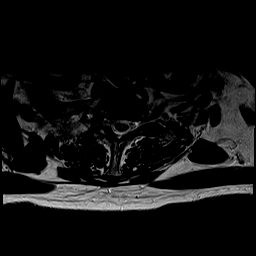
[im 6/34]
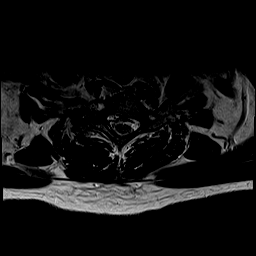
[im 8/34]
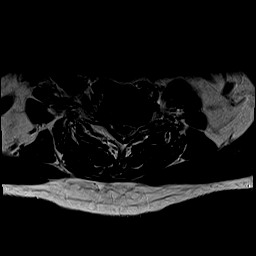
[im 11/34]
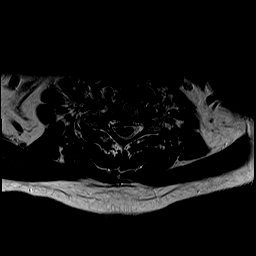
[im 16/34]
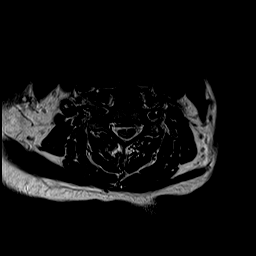
[im 18/34]
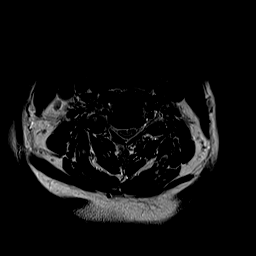
[im 23/34]
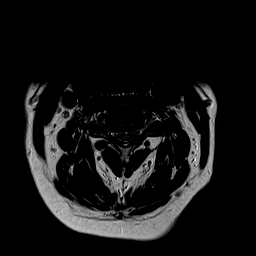
[im 28/34]
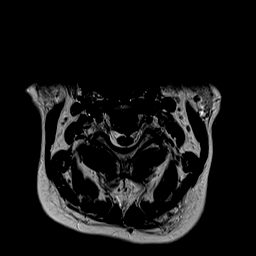
[im 34/34]
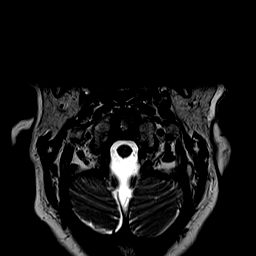

[Series 9: GRE · axial · 3.0mm · 0.35mm/px · z∈[-54,+42]mm · 8 of 34 slices shown]
[im 1/34]
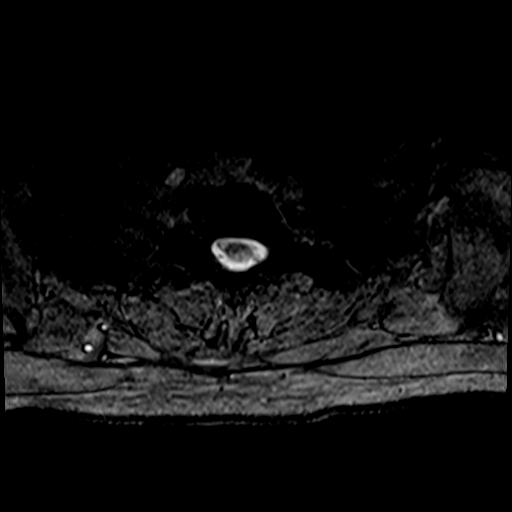
[im 6/34]
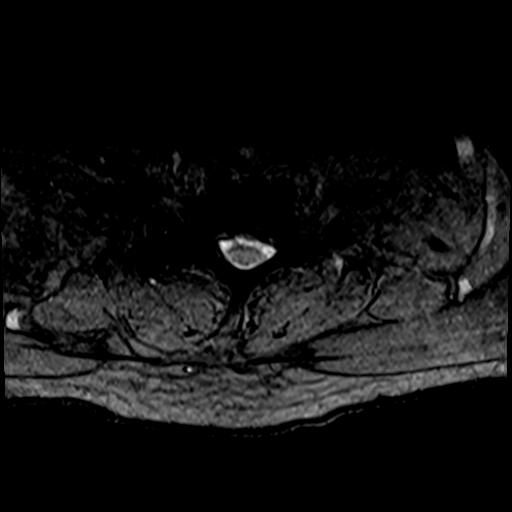
[im 11/34]
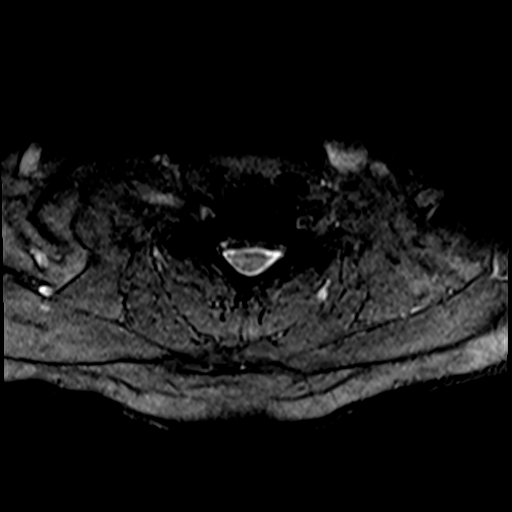
[im 16/34]
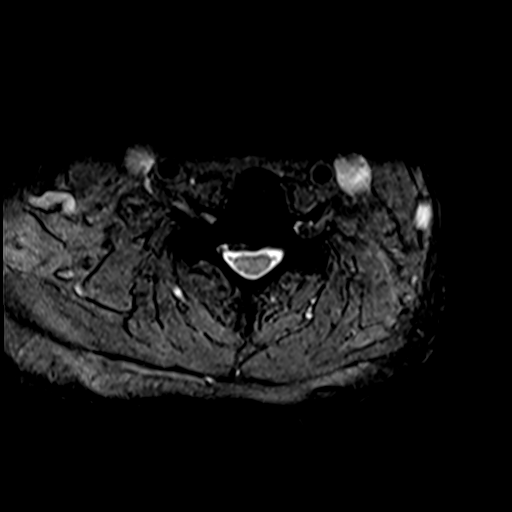
[im 18/34]
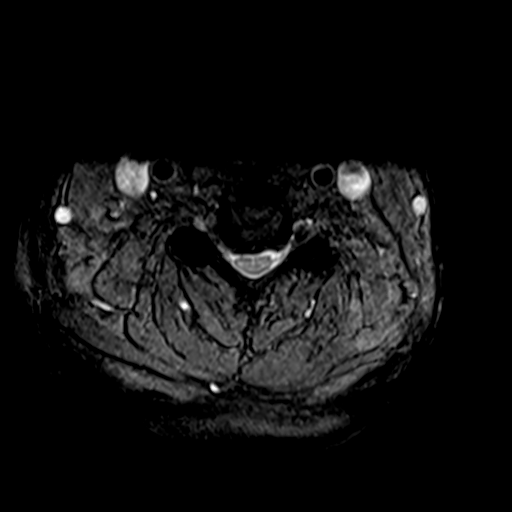
[im 23/34]
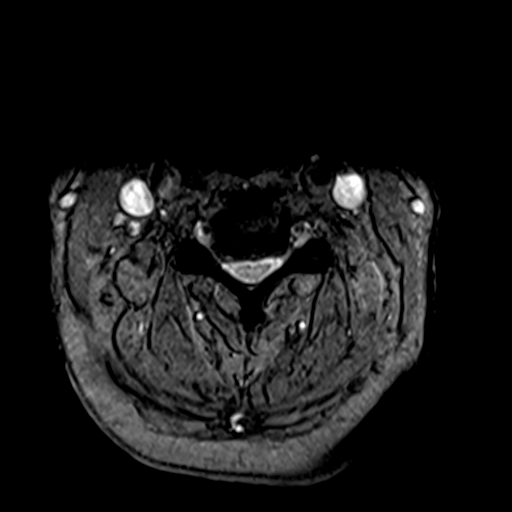
[im 28/34]
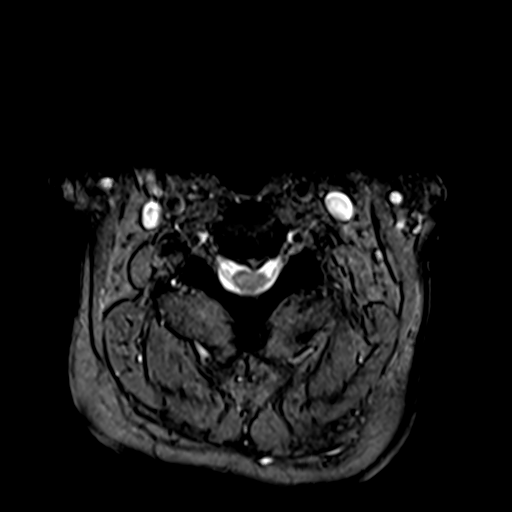
[im 34/34]
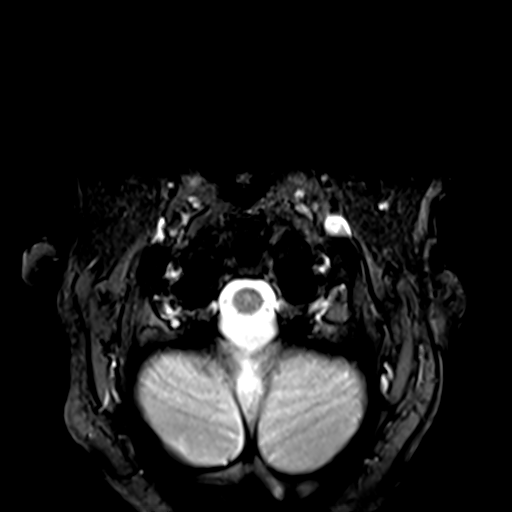

[38 of 48 positions shown; findings below may reference images not displayed]

FINDINGS: Alignment: Straightening of the cervical curvature.

Vertebrae: No fracture, evidence of discitis, or bone lesion.

Cord: T2 hyperintense focus within the spinal cord at the C4 level,
suggestive of myelomalacia.

Posterior Fossa, vertebral arteries, paraspinal tissues: Negative.

Disc levels:

C2-3: Posterior disc protrusion resulting in mild spinal canal
stenosis. Facet degenerative changes without significant neural
foraminal narrowing.

C3-4: Posterior disc osteophyte complex and prominence of the
ligamentum flavum resulting in moderate spinal canal stenosis with
mass effect on the cord with increased T2 signal within the cord at
the level of the body of C4, suggestive of myelomalacia.
Uncovertebral and facet degenerative changes resulting in severe
right and moderate left neural foraminal narrowing.

C4-5: Posterior disc protrusion resulting in mild spinal canal
stenosis. Uncovertebral and facet degenerative changes resulting in
moderate right and mild left neural foraminal narrowing.

C5-6: Posterior disc protrusion resulting in mild spinal canal
stenosis. Uncovertebral and facet degenerative changes resulting in
severe right and mild left neural foraminal narrowing.

C6-7: Posterior disc protrusion and prominence of the ligamentum
flavum resulting in mild spinal canal stenosis. Uncovertebral and
facet degenerative changes resulting in severe bilateral neural
foraminal narrowing.

C7-T1: Facet degenerative changes resulting in mild-to-moderate
right neural foraminal narrowing. No spinal canal stenosis.
IMPRESSION: 1. Multilevel cervical spondylosis as described above, worst at C3-4
where there is moderate spinal canal stenosis with mass effect on
the cord and increased T2 signal suggestive of myelomalacia.
2. Mild spinal canal stenosis at C2-3, C4-5, C5-6 and C6-7.
3. Multilevel neural foraminal narrowing, severe on the right at
C3-4 and C5-6 and bilaterally at C6-7.

## 2020-07-19 ENCOUNTER — Other Ambulatory Visit: Payer: Self-pay | Admitting: Orthopedic Surgery

## 2020-07-19 DIAGNOSIS — G959 Disease of spinal cord, unspecified: Secondary | ICD-10-CM

## 2020-07-26 DIAGNOSIS — M4712 Other spondylosis with myelopathy, cervical region: Secondary | ICD-10-CM | POA: Insufficient documentation

## 2020-07-28 ENCOUNTER — Other Ambulatory Visit: Payer: Self-pay | Admitting: Neurosurgery

## 2020-07-28 ENCOUNTER — Ambulatory Visit: Payer: Medicaid Other | Admitting: Orthopedic Surgery

## 2020-08-03 ENCOUNTER — Telehealth: Payer: Self-pay | Admitting: Family Medicine

## 2020-08-03 DIAGNOSIS — M542 Cervicalgia: Secondary | ICD-10-CM

## 2020-08-03 MED ORDER — ATORVASTATIN CALCIUM 10 MG PO TABS: ORAL_TABLET | ORAL | 0 refills | Status: DC

## 2020-08-03 MED ORDER — LEVOTHYROXINE SODIUM 175 MCG PO TABS: 175.0000 ug | ORAL_TABLET | Freq: Every day | ORAL | 0 refills | Status: DC

## 2020-08-03 MED ORDER — METHOCARBAMOL 500 MG PO TABS: ORAL_TABLET | ORAL | 0 refills | Status: DC

## 2020-08-03 NOTE — Telephone Encounter (Signed)
  Prescription Request  08/03/2020  What is the name of the medication or equipment? Methocarbamol, Levothyroxine, Lipitor   Have you contacted your pharmacy to request a refill? (if applicable) yes   Which pharmacy would you like this sent to? [facility] in [place]   Made appt for 08/11/19   Patient notified that their request is being sent to the clinical staff for review and that they should receive a response within 2 business days.

## 2020-08-10 ENCOUNTER — Ambulatory Visit: Payer: Medicaid Other | Admitting: Nurse Practitioner

## 2020-08-11 ENCOUNTER — Other Ambulatory Visit: Payer: Self-pay | Admitting: Nurse Practitioner

## 2020-08-11 ENCOUNTER — Other Ambulatory Visit: Payer: Self-pay | Admitting: Neurosurgery

## 2020-08-11 ENCOUNTER — Other Ambulatory Visit: Payer: Self-pay

## 2020-08-11 ENCOUNTER — Telehealth: Payer: Self-pay | Admitting: *Deleted

## 2020-08-11 ENCOUNTER — Ambulatory Visit: Payer: Medicaid Other | Admitting: Nurse Practitioner

## 2020-08-11 ENCOUNTER — Encounter: Payer: Self-pay | Admitting: Nurse Practitioner

## 2020-08-11 VITALS — BP 142/96 | HR 87 | Temp 98.1°F | Resp 20 | Ht 68.0 in | Wt 176.0 lb

## 2020-08-11 DIAGNOSIS — G8929 Other chronic pain: Secondary | ICD-10-CM

## 2020-08-11 DIAGNOSIS — M542 Cervicalgia: Secondary | ICD-10-CM

## 2020-08-11 DIAGNOSIS — F411 Generalized anxiety disorder: Secondary | ICD-10-CM

## 2020-08-11 DIAGNOSIS — F319 Bipolar disorder, unspecified: Secondary | ICD-10-CM

## 2020-08-11 DIAGNOSIS — I10 Essential (primary) hypertension: Secondary | ICD-10-CM | POA: Diagnosis not present

## 2020-08-11 MED ORDER — CARBAMAZEPINE 100 MG PO CHEW: 200.0000 mg | CHEWABLE_TABLET | Freq: Two times a day (BID) | ORAL | 1 refills | Status: DC

## 2020-08-11 MED ORDER — HYDROXYZINE HCL 25 MG PO TABS: 25.0000 mg | ORAL_TABLET | Freq: Two times a day (BID) | ORAL | 2 refills | Status: DC | PRN

## 2020-08-11 MED ORDER — QUETIAPINE FUMARATE ER 150 MG PO TB24: 150.0000 mg | ORAL_TABLET | Freq: Every day | ORAL | 5 refills | Status: DC

## 2020-08-11 MED ORDER — CARBAMAZEPINE 200 MG PO TABS: ORAL_TABLET | ORAL | 1 refills | Status: DC

## 2020-08-11 NOTE — Patient Instructions (Signed)

## 2020-08-11 NOTE — Assessment & Plan Note (Signed)
Anxiety well controlled on current medication no changes necessary.  Hydroxyzine 25 mg tablet by mouth twice daily as needed.  Education provided stress management and anxiety control, printed handouts given.  Rx sent to pharmacy.

## 2020-08-11 NOTE — Progress Notes (Signed)
Established Patient Office Visit  Subjective:  Patient ID: [name], male    DOB: 1961/06/07  Age: 60 y.o. MRN: [id number]  CC:  Chief Complaint  Patient presents with  . Medication Refill    HPI [name] presents for Pain  He reports chronic Neck pain. was an injury that may have caused the pain. The pain started few years ago and is staying constant. The pain does not radiate . The pain is described as aching, is moderate in intensity, occurring intermittently. Symptoms are worse in the: morning, mid-day, afternoon  Aggravating factors: none :prescription medication: Robaxin 500 mg tablet by mouth 3 times daily. He has tried NSAIDs with mild relief.   --------------------------------------------------------------------------------------------------- Pt presents for follow up of hypertension. Patient was diagnosed in 09/25/15. The patient is tolerating the medication well without side effects. Compliance with treatment has been good; including taking medication as directed , maintains a healthy diet and regular exercise regimen , and following up as directed.  Current medication amlodipine 5 mg tablet by mouth daily.  Anxiety: Patient complains of anxiety disorder.  He has the following symptoms: difficulty concentrating. Onset of symptoms was approximately a few years ago, unchanged since that time. He denies current suicidal and homicidal ideation. Family history significant for anxiety.Possible organic causes contributing are: none. Risk factors: previous episode of depression Previous treatment includes Zoloft.  He complains of the following side effects from the treatment: none.   Past Medical History:  Diagnosis Date  . Anxiety   . Anxiety and depression   . Depression   . Hemorrhoids   . Hyperlipidemia 02/06/2020  . Hypertension   . Hypothyroidism   . OSA on CPAP    doesn't use often pt states   . Seizures ([place]) 02/2016  . Suicide and self-inflicted injury  ([place])   . Vitamin B12 deficiency 02/06/2020    Past Surgical History:  Procedure Laterality Date  . BICEPS TENDON REPAIR Right   . CARPAL TUNNEL RELEASE Bilateral   . DECORTICATION Left 11/12/2018   Procedure: DECORTICATION;  Surgeon: Ivin Poot, MD;  Location: [place];  Service: Thoracic;  Laterality: Left;  . KNEE CARTILAGE SURGERY Left   . KNEE SURGERY Right    x 2  . LAPAROTOMY N/A 11/03/2018   Procedure: EXPLORATORY LAPAROTOMY;  Surgeon: Erroll Luna, MD;  Location: [place];  Service: General;  Laterality: N/A;  . VIDEO ASSISTED THORACOSCOPY (VATS)/EMPYEMA Left 11/12/2018   Procedure: VIDEO ASSISTED THORACOSCOPY (VATS)/EMPYEMA, MINI THORACOTOMY;  Surgeon: Ivin Poot, MD;  Location: [facility];  Service: Thoracic;  Laterality: Left;  needs cell saver    Family History  Problem Relation Age of Onset  . Kidney disease Mother   . Hypertension Father   . Multiple sclerosis Sister   . Stroke Brother   . Breast cancer Paternal Grandmother   . Kidney failure Maternal Grandmother   . Kidney disease Maternal Grandmother   . Heart defect Maternal Uncle     Social History   Socioeconomic History  . Marital status: Married    Spouse name: [name]  . Number of children: 1  . Years of education: 11  . Highest education level: 12th grade  Occupational History  . Occupation: Architect    Comment: remodels houses  . Occupation: Clinical research associate     Comment: quit last week   Tobacco Use  . Smoking status: Current Every Day Smoker    Packs/day: 1.50    Years: 51.00  Pack years: 76.50    Types: Cigarettes    Start date: 2  . Smokeless tobacco: Never Used  Vaping Use  . Vaping Use: Never used  Substance and Sexual Activity  . Alcohol use: Not Currently    Comment: none since March 2020  . Drug use: Not Currently    Frequency: 3.0 times per week    Types: Marijuana    Comment: 02/03/20- states no   . Sexual activity: Not Currently    Partners: Female    Birth  control/protection: None  Other Topics Concern  . Not on file  Social History Narrative   ** Merged History Encounter **       Lives with wife Caffeine- coffee 2 cups daily   Social Determinants of Health   Financial Resource Strain: Not on file  Food Insecurity: Not on file  Transportation Needs: Not on file  Physical Activity: Not on file  Stress: Not on file  Social Connections: Not on file  Intimate Partner Violence: Not on file    Outpatient Medications Prior to Visit  Medication Sig Dispense Refill  . amLODipine (NORVASC) 5 MG tablet Take 1 tablet (5 mg total) by mouth daily. 30 tablet 0  . atorvastatin (LIPITOR) 10 MG tablet TAKE 1 TABLET(10 MG) BY MOUTH DAILY 30 tablet 0  . levothyroxine (SYNTHROID) 175 MCG tablet Take 1 tablet (175 mcg total) by mouth daily at 6 (six) AM. 30 tablet 0  . sertraline (ZOLOFT) 100 MG tablet Take 1 tablet (100 mg total) by mouth daily. 90 tablet 1  . carbamazepine (TEGRETOL) 200 MG tablet TAKE 1 TABLET(200 MG) BY MOUTH TWICE DAILY 180 tablet 1  . hydrOXYzine (ATARAX/VISTARIL) 25 MG tablet Take 1 tablet (25 mg total) by mouth 2 (two) times daily as needed for anxiety. 60 tablet 2  . QUEtiapine Fumarate (SEROQUEL XR) 150 MG 24 hr tablet Take 1 tablet (150 mg total) by mouth at bedtime. 30 tablet 5  . diclofenac (VOLTAREN) 75 MG EC tablet Take 1 tablet (75 mg total) by mouth 2 (two) times daily. (Patient not taking: Reported on 08/11/2020) 30 tablet 0  . methocarbamol (ROBAXIN) 500 MG tablet TAKE 1 TABLET(500 MG) BY MOUTH THREE TIMES DAILY AS NEEDED FOR MUSCLE SPASMS (Patient not taking: Reported on 08/11/2020) 60 tablet 0   Facility-Administered Medications Prior to Visit  Medication Dose Route Frequency Provider Last Rate Last Admin  . gadopentetate dimeglumine (MAGNEVIST) injection 15 mL  15 mL Intravenous Once PRN Sater, Pearletha Furl, MD        Allergies  Allergen Reactions  . Benadryl [Diphenhydramine] Other (See Comments)    "throws me for a  loop".    ROS Review of Systems  Musculoskeletal: Positive for neck pain.  Psychiatric/Behavioral: Negative for decreased concentration, self-injury, sleep disturbance and suicidal ideas. The patient is nervous/anxious.   All other systems reviewed and are negative.     Objective:    Physical Exam Vitals reviewed.  Constitutional:      Appearance: Normal appearance.  HENT:     Head: Normocephalic.  Eyes:     Conjunctiva/sclera: Conjunctivae normal.  Cardiovascular:     Rate and Rhythm: Normal rate and regular rhythm.     Pulses: Normal pulses.     Heart sounds: Normal heart sounds.  Pulmonary:     Effort: Pulmonary effort is normal.     Breath sounds: Normal breath sounds.  Abdominal:     General: Bowel sounds are normal.  Musculoskeletal:  General: Tenderness present.  Skin:    General: Skin is warm.  Neurological:     Mental Status: He is alert.  Psychiatric:     Comments: Anxiety and depression.     BP (!) 142/96   Pulse 87   Temp 98.1 F (36.7 C) (Temporal)   Resp 20   Ht 5\' 8"  (1.727 m)   Wt 176 lb (79.8 kg)   SpO2 96%   BMI 26.76 kg/m  Wt Readings from Last 3 Encounters:  08/11/20 176 lb (79.8 kg)  07/06/20 180 lb (81.6 kg)  06/17/20 181 lb (82.1 kg)     Health Maintenance Due  Topic Date Due  . COVID-19 Vaccine (2 - Booster for Janssen series) 12/31/2019    There are no preventive care reminders to display for this patient.  Lab Results  Component Value Date   TSH 3.390 02/03/2020   Lab Results  Component Value Date   WBC 9.3 02/03/2020   HGB 15.0 02/03/2020   HCT 42.9 02/03/2020   MCV 94 02/03/2020   PLT 257 02/03/2020   Lab Results  Component Value Date   NA 143 02/03/2020   K 4.6 02/03/2020   CO2 25 02/03/2020   GLUCOSE 83 02/03/2020   BUN 14 02/03/2020   CREATININE 0.93 02/03/2020   BILITOT 0.2 02/03/2020   ALKPHOS 158 (H) 02/03/2020   AST 11 02/03/2020   ALT 6 02/03/2020   PROT 7.5 02/03/2020   ALBUMIN 4.6  02/03/2020   CALCIUM 9.8 02/03/2020   ANIONGAP 9 05/04/2019   Lab Results  Component Value Date   CHOL 249 (H) 02/03/2020   Lab Results  Component Value Date   HDL 38 (L) 02/03/2020   Lab Results  Component Value Date   LDLCALC 164 (H) 02/03/2020   Lab Results  Component Value Date   TRIG 250 (H) 02/03/2020   Lab Results  Component Value Date   CHOLHDL 6.6 (H) 02/03/2020   Lab Results  Component Value Date   HGBA1C 4.9 12/10/2018      Assessment & Plan:   Problem List Items Addressed This Visit      Cardiovascular and Mediastinum   Hypertension    Hypertension well controlled on amlodipine 5 mg tablet by mouth daily.  No changes to current medication.  Continue to provide education to patient with printed handouts given.  Low-sodium healthy diet and exercise regimen as tolerated.  Follow-up in 3 months.      Relevant Orders   CBC with Differential   Comprehensive metabolic panel   Lipid Panel     Other   Anxiety state    Anxiety well controlled on current medication no changes necessary.  Hydroxyzine 25 mg tablet by mouth twice daily as needed.  Education provided stress management and anxiety control, printed handouts given.  Rx sent to pharmacy.      Relevant Medications   hydrOXYzine (ATARAX/VISTARIL) 25 MG tablet   Bipolar disorder (HCC) - Primary   Relevant Medications   QUEtiapine Fumarate (SEROQUEL XR) 150 MG 24 hr tablet   carbamazepine (TEGRETOL) 200 MG tablet   Other Relevant Orders   Carbamazepine, Free, Serum   Chronic neck pain    Chronic neck pain controlled on Robaxin 500 mg tablet by mouth 3 times daily as needed for muscle spasm.  No changes to current dose.  Education provided with printed handouts given.  Rx sent to pharmacy.      Relevant Medications   carbamazepine (TEGRETOL) 200 MG  tablet      Meds ordered this encounter  Medications  . QUEtiapine Fumarate (SEROQUEL XR) 150 MG 24 hr tablet    Sig: Take 1 tablet (150 mg  total) by mouth at bedtime.    Dispense:  30 tablet    Refill:  5  . hydrOXYzine (ATARAX/VISTARIL) 25 MG tablet    Sig: Take 1 tablet (25 mg total) by mouth 2 (two) times daily as needed for anxiety.    Dispense:  60 tablet    Refill:  2  . carbamazepine (TEGRETOL) 200 MG tablet    Sig: TAKE 1 TABLET(200 MG) BY MOUTH TWICE DAILY    Dispense:  180 tablet    Refill:  1    Follow-up: Return in about 6 months (around 02/08/2021).    Ivy Lynn, NP

## 2020-08-11 NOTE — Telephone Encounter (Signed)
PA came in today for pt  CARBAMAZEPINE 200 MG TAB  This is not covered by Medicaid, however CARBAMAZEPINE CHEW TAB and CARBAMAZEPINE ER cap  Are covered - can this be switched?

## 2020-08-11 NOTE — Assessment & Plan Note (Signed)
Hypertension well controlled on amlodipine 5 mg tablet by mouth daily.  No changes to current medication.  Continue to provide education to patient with printed handouts given.  Low-sodium healthy diet and exercise regimen as tolerated.  Follow-up in 3 months.

## 2020-08-11 NOTE — Assessment & Plan Note (Signed)
Chronic neck pain controlled on Robaxin 500 mg tablet by mouth 3 times daily as needed for muscle spasm.  No changes to current dose.  Education provided with printed handouts given.  Rx sent to pharmacy.

## 2020-08-11 NOTE — Telephone Encounter (Signed)
I changed it and ordered the approved medication  to patients pharmacy.

## 2020-08-12 LAB — CBC WITH DIFFERENTIAL/PLATELET
Basophils Absolute: 0.1 10*3/uL (ref 0.0–0.2)
Basos: 1 %
EOS (ABSOLUTE): 0.1 10*3/uL (ref 0.0–0.4)
Eos: 1 %
Hematocrit: 43.4 % (ref 37.5–51.0)
Hemoglobin: 14.5 g/dL (ref 13.0–17.7)
Immature Grans (Abs): 0 10*3/uL (ref 0.0–0.1)
Immature Granulocytes: 0 %
Lymphocytes Absolute: 2.4 10*3/uL (ref 0.7–3.1)
Lymphs: 30 %
MCH: 31.7 pg (ref 26.6–33.0)
MCHC: 33.4 g/dL (ref 31.5–35.7)
MCV: 95 fL (ref 79–97)
Monocytes Absolute: 0.6 10*3/uL (ref 0.1–0.9)
Monocytes: 7 %
Neutrophils Absolute: 4.8 10*3/uL (ref 1.4–7.0)
Neutrophils: 61 %
Platelets: 242 10*3/uL (ref 150–450)
RBC: 4.57 x10E6/uL (ref 4.14–5.80)
RDW: 13.1 % (ref 11.6–15.4)
WBC: 8 10*3/uL (ref 3.4–10.8)

## 2020-08-12 LAB — COMPREHENSIVE METABOLIC PANEL
ALT: 6 IU/L (ref 0–44)
AST: 7 IU/L (ref 0–40)
Albumin/Globulin Ratio: 1.9 (ref 1.2–2.2)
Albumin: 4.4 g/dL (ref 3.8–4.9)
Alkaline Phosphatase: 152 IU/L — ABNORMAL HIGH (ref 44–121)
BUN/Creatinine Ratio: 13 (ref 10–24)
BUN: 14 mg/dL (ref 8–27)
Bilirubin Total: <0.2 mg/dL (ref 0.0–1.2)
CO2: 24 mmol/L (ref 20–29)
Calcium: 9.6 mg/dL (ref 8.6–10.2)
Chloride: 102 mmol/L (ref 96–106)
Creatinine, Ser: 1.1 mg/dL (ref 0.76–1.27)
GFR calc Af Amer: 84 mL/min/{1.73_m2} (ref >59)
GFR calc non Af Amer: 73 mL/min/{1.73_m2} (ref >59)
Globulin, Total: 2.3 g/dL (ref 1.5–4.5)
Glucose: 92 mg/dL (ref 65–99)
Potassium: 4.7 mmol/L (ref 3.5–5.2)
Sodium: 140 mmol/L (ref 134–144)
Total Protein: 6.7 g/dL (ref 6.0–8.5)

## 2020-08-12 LAB — LIPID PANEL
Chol/HDL Ratio: 4.8 ratio (ref 0.0–5.0)
Cholesterol, Total: 160 mg/dL (ref 100–199)
HDL: 33 mg/dL — ABNORMAL LOW (ref >39)
LDL Chol Calc (NIH): 103 mg/dL — ABNORMAL HIGH (ref 0–99)
Triglycerides: 130 mg/dL (ref 0–149)
VLDL Cholesterol Cal: 24 mg/dL (ref 5–40)

## 2020-08-12 LAB — CARBAMAZEPINE, FREE, SERUM: Carbamazepine, Free: 2 ug/mL (ref 0.6–4.2)

## 2020-08-20 ENCOUNTER — Other Ambulatory Visit (HOSPITAL_COMMUNITY): Payer: Medicaid Other

## 2020-08-20 ENCOUNTER — Inpatient Hospital Stay (HOSPITAL_COMMUNITY): Admission: RE | Admit: 2020-08-20 | Payer: Medicaid Other | Source: Ambulatory Visit

## 2020-08-29 ENCOUNTER — Other Ambulatory Visit: Payer: Self-pay | Admitting: Family

## 2020-09-06 ENCOUNTER — Other Ambulatory Visit (HOSPITAL_COMMUNITY): Payer: Medicaid Other

## 2020-09-16 ENCOUNTER — Encounter (HOSPITAL_COMMUNITY)
Admission: RE | Admit: 2020-09-16 | Discharge: 2020-09-16 | Disposition: A | Discharge status: Home / Self Care | Payer: Medicaid Other | Source: Ambulatory Visit | Attending: Neurosurgery | Admitting: Neurosurgery

## 2020-09-16 ENCOUNTER — Other Ambulatory Visit: Payer: Self-pay

## 2020-09-16 ENCOUNTER — Other Ambulatory Visit (HOSPITAL_COMMUNITY)
Admission: RE | Admit: 2020-09-16 | Discharge: 2020-09-16 | Disposition: A | Discharge status: Home / Self Care | Payer: Medicaid Other | Source: Ambulatory Visit | Attending: Neurosurgery | Admitting: Neurosurgery

## 2020-09-16 ENCOUNTER — Encounter (HOSPITAL_COMMUNITY): Payer: Self-pay

## 2020-09-16 DIAGNOSIS — Z20822 Contact with and (suspected) exposure to covid-19: Secondary | ICD-10-CM | POA: Insufficient documentation

## 2020-09-16 DIAGNOSIS — Z01812 Encounter for preprocedural laboratory examination: Secondary | ICD-10-CM | POA: Insufficient documentation

## 2020-09-16 HISTORY — DX: Unspecified osteoarthritis, unspecified site: M19.90

## 2020-09-16 HISTORY — DX: Headache, unspecified: R51.9

## 2020-09-16 HISTORY — DX: Pneumonia, unspecified organism: J18.9

## 2020-09-16 LAB — BASIC METABOLIC PANEL
Anion gap: 9 (ref 5–15)
BUN: 10 mg/dL (ref 6–20)
CO2: 25 mmol/L (ref 22–32)
Calcium: 8.7 mg/dL — ABNORMAL LOW (ref 8.9–10.3)
Chloride: 106 mmol/L (ref 98–111)
Creatinine, Ser: 0.96 mg/dL (ref 0.61–1.24)
GFR, Estimated: >60 mL/min (ref >60)
Glucose, Bld: 93 mg/dL (ref 70–99)
Potassium: 4 mmol/L (ref 3.5–5.1)
Sodium: 140 mmol/L (ref 135–145)

## 2020-09-16 LAB — SARS CORONAVIRUS 2 (TAT 6-24 HRS): SARS Coronavirus 2: NEGATIVE

## 2020-09-16 LAB — CBC
HCT: 43.5 % (ref 39.0–52.0)
Hemoglobin: 14.3 g/dL (ref 13.0–17.0)
MCH: 31.2 pg (ref 26.0–34.0)
MCHC: 32.9 g/dL (ref 30.0–36.0)
MCV: 95 fL (ref 80.0–100.0)
Platelets: 255 10*3/uL (ref 150–400)
RBC: 4.58 MIL/uL (ref 4.22–5.81)
RDW: 13.5 % (ref 11.5–15.5)
WBC: 7.8 10*3/uL (ref 4.0–10.5)
nRBC: 0 % (ref 0.0–0.2)

## 2020-09-16 LAB — TYPE AND SCREEN
ABO/RH(D): A POS
Antibody Screen: NEGATIVE

## 2020-09-16 LAB — SURGICAL PCR SCREEN
MRSA, PCR: NEGATIVE
Staphylococcus aureus: NEGATIVE

## 2020-09-16 NOTE — Progress Notes (Addendum)
PCP - [name], FNP  EKG - today  Sleep Study - 2017 (in Epic) CPAP - needs new mask  COVID TEST- today   Anesthesia review: yes - EKG  Patient denies shortness of breath, fever, cough and chest pain at PAT appointment   All instructions explained to the patient, with a verbal understanding of the material. Patient agrees to go over the instructions while at home for a better understanding. Patient also instructed to self quarantine after being tested for COVID-19. The opportunity to ask questions was provided.

## 2020-09-16 NOTE — Pre-Procedure Instructions (Signed)
[name]   Your procedure is scheduled on Monday, September 20, 2020 at 7:30 AM   Report to [facility] Entrance "A" Admitting Office at 5:30 AM.   Call this number if you have problems the morning of surgery: [phone]   Questions prior to day of surgery, please call [phone] between 8 & 4 PM.   Remember:  Do not eat or drink after midnight Sunday, 09/19/20.  Take these medicines the morning of surgery with A SIP OF WATER: Amlodipine (Norvasc), Atorvastatin (Lipitor), Carbamazepine (Tegretol), Hydroxyzine (Atarax), Levothyroxine (Synthroid), Sertraline (Zoloft), Methocarbamol (Robaxin)   Stop NSAIDS (Diclofenac, Voltaren, Ibuprofen, Aleve, etc) as of today prior to surgery. Do not use Aspirin containing products, Multivitamins, Fish Oil or Herbal medications prior to surgery.  Do NOT smoke 24 hours prior to surgery.    Do not wear jewelry.  Do not wear lotions, powders, cologne or deodorant.  Men may shave face and neck.  Do not bring valuables to the hospital.  [facility] is not responsible for any belongings or valuables.  Contacts, dentures or bridgework may not be worn into surgery.  Leave your suitcase in the car.  After surgery it may be brought to your room.  For patients admitted to the hospital, discharge time will be determined by your treatment team.  Patients discharged the day of surgery will not be allowed to drive home.   [place] - Preparing for Surgery  Before surgery, you can play an important role.  Because skin is not sterile, your skin needs to be as free of germs as possible.  You can reduce the number of germs on you skin by washing with CHG (chlorahexidine gluconate) soap before surgery.  CHG is an antiseptic cleaner which kills germs and bonds with the skin to continue killing germs even after washing.  Oral Hygiene is also important in reducing the risk of infection.  Remember to brush your teeth with your regular toothpaste the  morning of surgery.  Please DO NOT use if you have an allergy to CHG or antibacterial soaps.  If your skin becomes reddened/irritated stop using the CHG and inform your nurse when you arrive at Short Stay.  Do not shave (including legs and underarms) for at least 48 hours prior to the first CHG shower.  You may shave your face.  Please follow these instructions carefully:   1.  Shower with CHG Soap the night before surgery and the morning of Surgery.  2.  If you choose to wash your hair, wash your hair first as usual with your normal shampoo.  3.  After you shampoo, rinse your hair and body thoroughly to remove the shampoo. 4.  Use CHG as you would any other liquid soap.  You can apply chg directly to the skin and wash gently with a      scrungie or washcloth.           5.  Apply the CHG Soap to your body ONLY FROM THE NECK DOWN.   Do not use on open wounds or open sores. Avoid contact with your eyes, ears, mouth and genitals (private parts).  Wash genitals (private parts) with your normal soap - do this prior to using CHG soap.  6.  Wash thoroughly, paying special attention to the area where your surgery will be performed.  7.  Thoroughly rinse your body with warm water from the neck down.  8.  DO NOT shower/wash with your normal soap after using  and rinsing off the CHG Soap.  9.  Pat yourself dry with a clean towel.            10.  Wear clean pajamas.            11.  Place clean sheets on your bed the night of your first shower and do not sleep with pets.  Day of Surgery  Shower as above. Do not apply any lotions/deodorants the morning of surgery.   Please wear clean clothes to the hospital. Remember to brush your teeth with toothpaste.  Please read over the following fact sheets that you were given.

## 2020-09-19 ENCOUNTER — Encounter (HOSPITAL_COMMUNITY): Payer: Self-pay

## 2020-09-19 ENCOUNTER — Other Ambulatory Visit: Payer: Self-pay | Admitting: Family

## 2020-09-19 DIAGNOSIS — M542 Cervicalgia: Secondary | ICD-10-CM

## 2020-09-20 ENCOUNTER — Ambulatory Visit (HOSPITAL_COMMUNITY): Payer: Medicaid Other | Admitting: Certified Registered Nurse Anesthetist

## 2020-09-20 ENCOUNTER — Ambulatory Visit (HOSPITAL_COMMUNITY)
Admission: RE | Admit: 2020-09-20 | Discharge: 2020-09-21 | Disposition: A | Discharge status: Home / Self Care | Payer: Medicaid Other | Attending: Neurosurgery | Admitting: Neurosurgery

## 2020-09-20 ENCOUNTER — Encounter (HOSPITAL_COMMUNITY)
Admission: RE | Disposition: A | Discharge status: Home / Self Care | Payer: Self-pay | Source: Home / Self Care | Attending: Neurosurgery

## 2020-09-20 ENCOUNTER — Ambulatory Visit (HOSPITAL_COMMUNITY): Payer: Medicaid Other

## 2020-09-20 ENCOUNTER — Encounter (HOSPITAL_COMMUNITY): Payer: Self-pay

## 2020-09-20 ENCOUNTER — Ambulatory Visit (HOSPITAL_COMMUNITY): Payer: Medicaid Other | Admitting: Physician Assistant

## 2020-09-20 ENCOUNTER — Other Ambulatory Visit: Payer: Self-pay

## 2020-09-20 DIAGNOSIS — Z79899 Other long term (current) drug therapy: Secondary | ICD-10-CM | POA: Insufficient documentation

## 2020-09-20 DIAGNOSIS — Z803 Family history of malignant neoplasm of breast: Secondary | ICD-10-CM | POA: Diagnosis not present

## 2020-09-20 DIAGNOSIS — F319 Bipolar disorder, unspecified: Secondary | ICD-10-CM | POA: Insufficient documentation

## 2020-09-20 DIAGNOSIS — M4802 Spinal stenosis, cervical region: Secondary | ICD-10-CM | POA: Insufficient documentation

## 2020-09-20 DIAGNOSIS — Z820 Family history of epilepsy and other diseases of the nervous system: Secondary | ICD-10-CM | POA: Diagnosis not present

## 2020-09-20 DIAGNOSIS — Z823 Family history of stroke: Secondary | ICD-10-CM | POA: Insufficient documentation

## 2020-09-20 DIAGNOSIS — Z791 Long term (current) use of non-steroidal anti-inflammatories (NSAID): Secondary | ICD-10-CM | POA: Diagnosis not present

## 2020-09-20 DIAGNOSIS — Z8249 Family history of ischemic heart disease and other diseases of the circulatory system: Secondary | ICD-10-CM | POA: Diagnosis not present

## 2020-09-20 DIAGNOSIS — Z841 Family history of disorders of kidney and ureter: Secondary | ICD-10-CM | POA: Diagnosis not present

## 2020-09-20 DIAGNOSIS — F17210 Nicotine dependence, cigarettes, uncomplicated: Secondary | ICD-10-CM | POA: Insufficient documentation

## 2020-09-20 DIAGNOSIS — Z888 Allergy status to other drugs, medicaments and biological substances status: Secondary | ICD-10-CM | POA: Insufficient documentation

## 2020-09-20 DIAGNOSIS — G992 Myelopathy in diseases classified elsewhere: Secondary | ICD-10-CM | POA: Insufficient documentation

## 2020-09-20 DIAGNOSIS — Z419 Encounter for procedure for purposes other than remedying health state, unspecified: Secondary | ICD-10-CM

## 2020-09-20 HISTORY — PX: ANTERIOR CERVICAL DECOMP/DISCECTOMY FUSION: SHX1161

## 2020-09-20 LAB — ABO/RH: ABO/RH(D): A POS

## 2020-09-20 IMAGING — RF DG CERVICAL SPINE 2 OR 3 VIEWS
1 series · 2 of 2 positions shown · non-contrast
Comparison: MRI cervical spine [DATE].

CLINICAL DATA: ACDF.

EXAM:
CERVICAL SPINE - 2-3 VIEW; DG C-ARM 1-60 MIN

[Series 1: unknown protocol · 0.14mm/px · 2 of 2 slices shown]
[im 1/2]
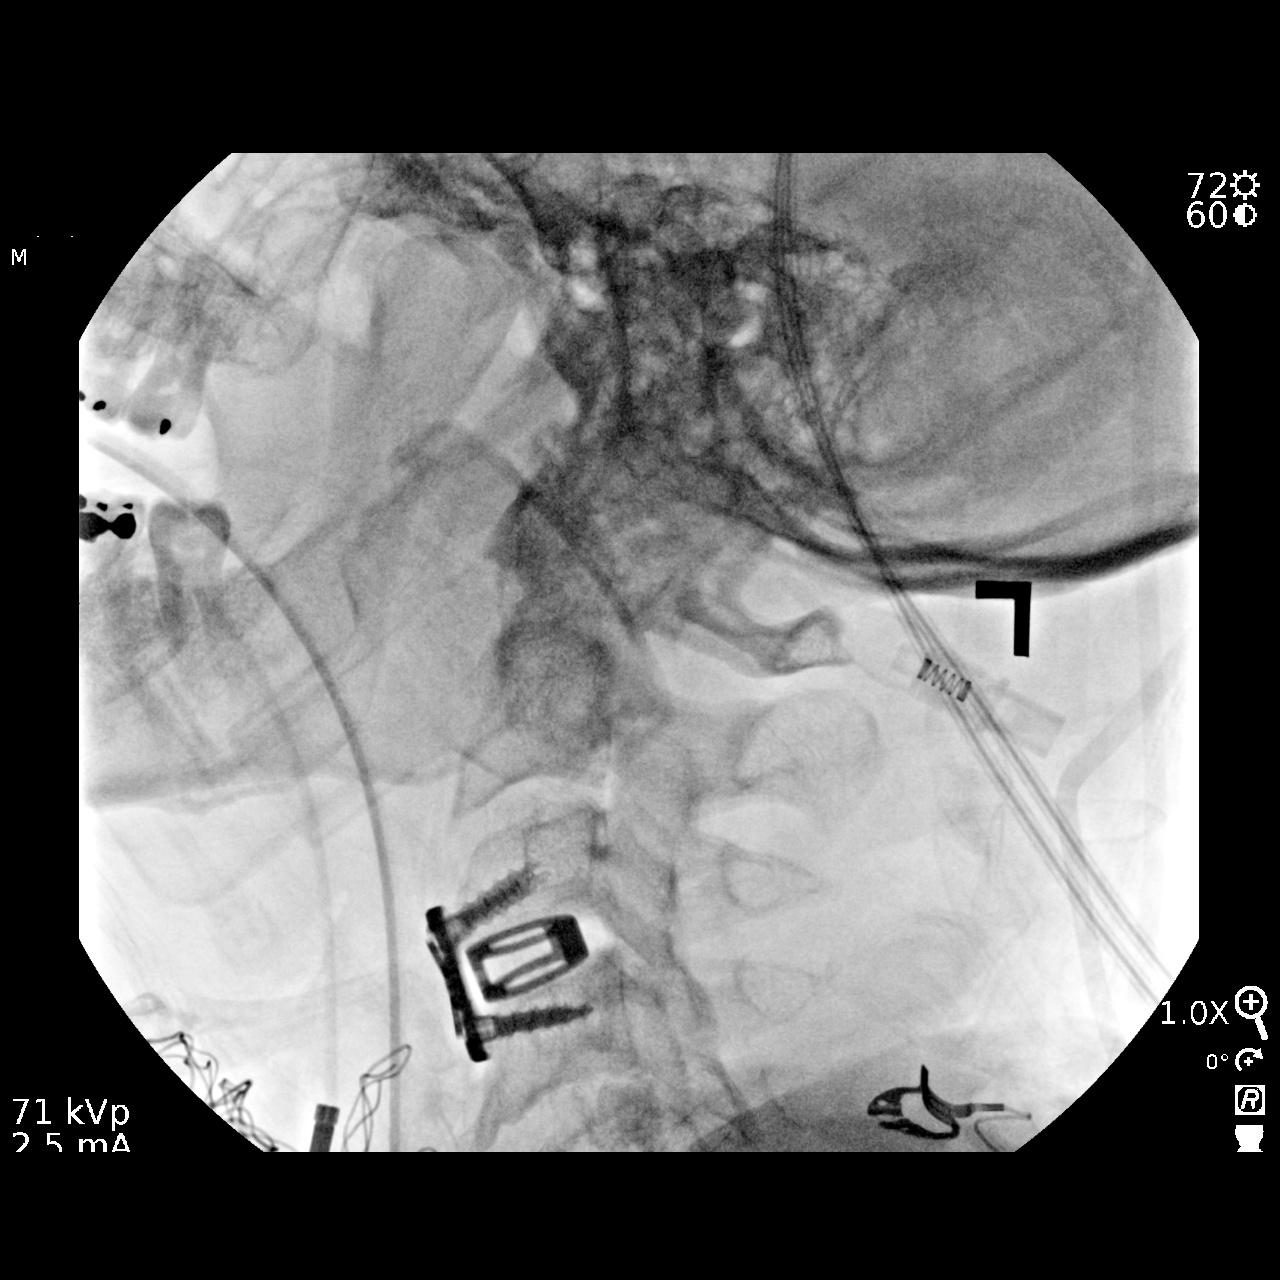
[im 2/2]
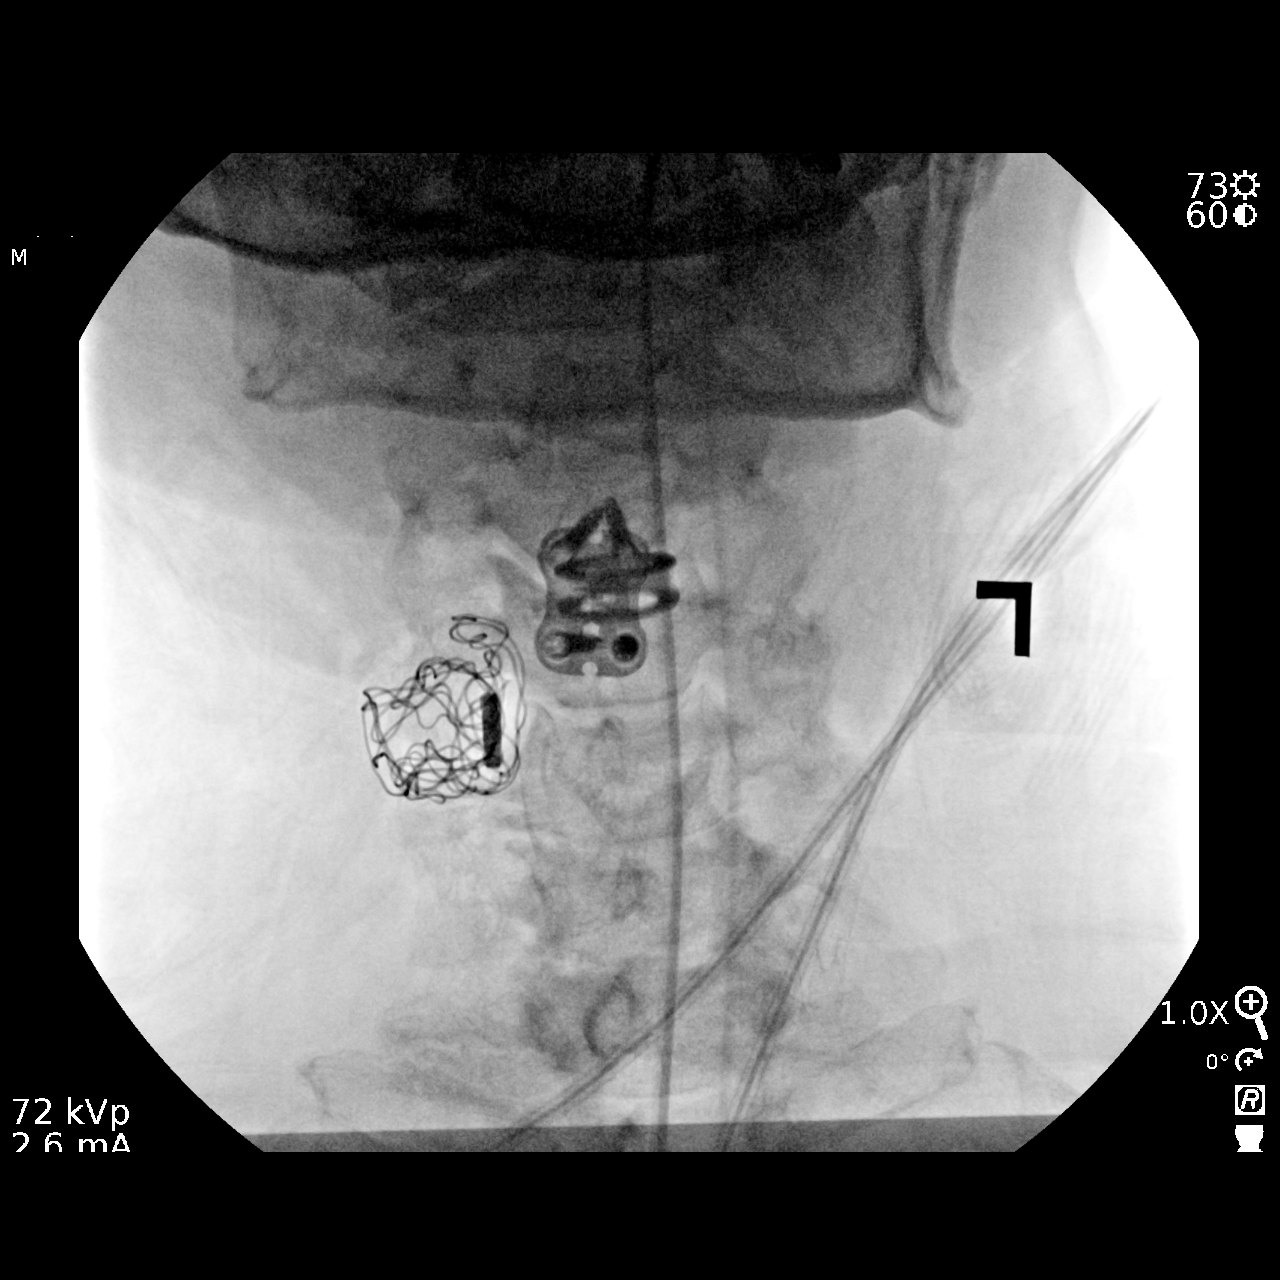

[2 of 2 positions shown; findings below may reference images not displayed]

FINDINGS: Fluoro time: 9 seconds.

Reported radiation: 0.59 mGy.

Two C-arm fluoroscopic images were obtained intraoperatively and
submitted for post operative interpretation. These images
demonstrate surgical changes of C3-C4 ACDF with intervening spacer.
Curvilinear foreign body material projects along the anterior right
neck, possibly gauze that is external to the patient. Please see the
performing provider's procedural report for further detail.
IMPRESSION: Intraoperative fluoroscopic imaging, as detailed above

## 2020-09-20 IMAGING — RF DG C-ARM 1-60 MIN
1 series · 2 of 2 positions shown · non-contrast
Comparison: MRI cervical spine [DATE].

CLINICAL DATA: ACDF.

EXAM:
CERVICAL SPINE - 2-3 VIEW; DG C-ARM 1-60 MIN

[Series 1: unknown protocol · 0.14mm/px · 2 of 2 slices shown]
[im 1/2]
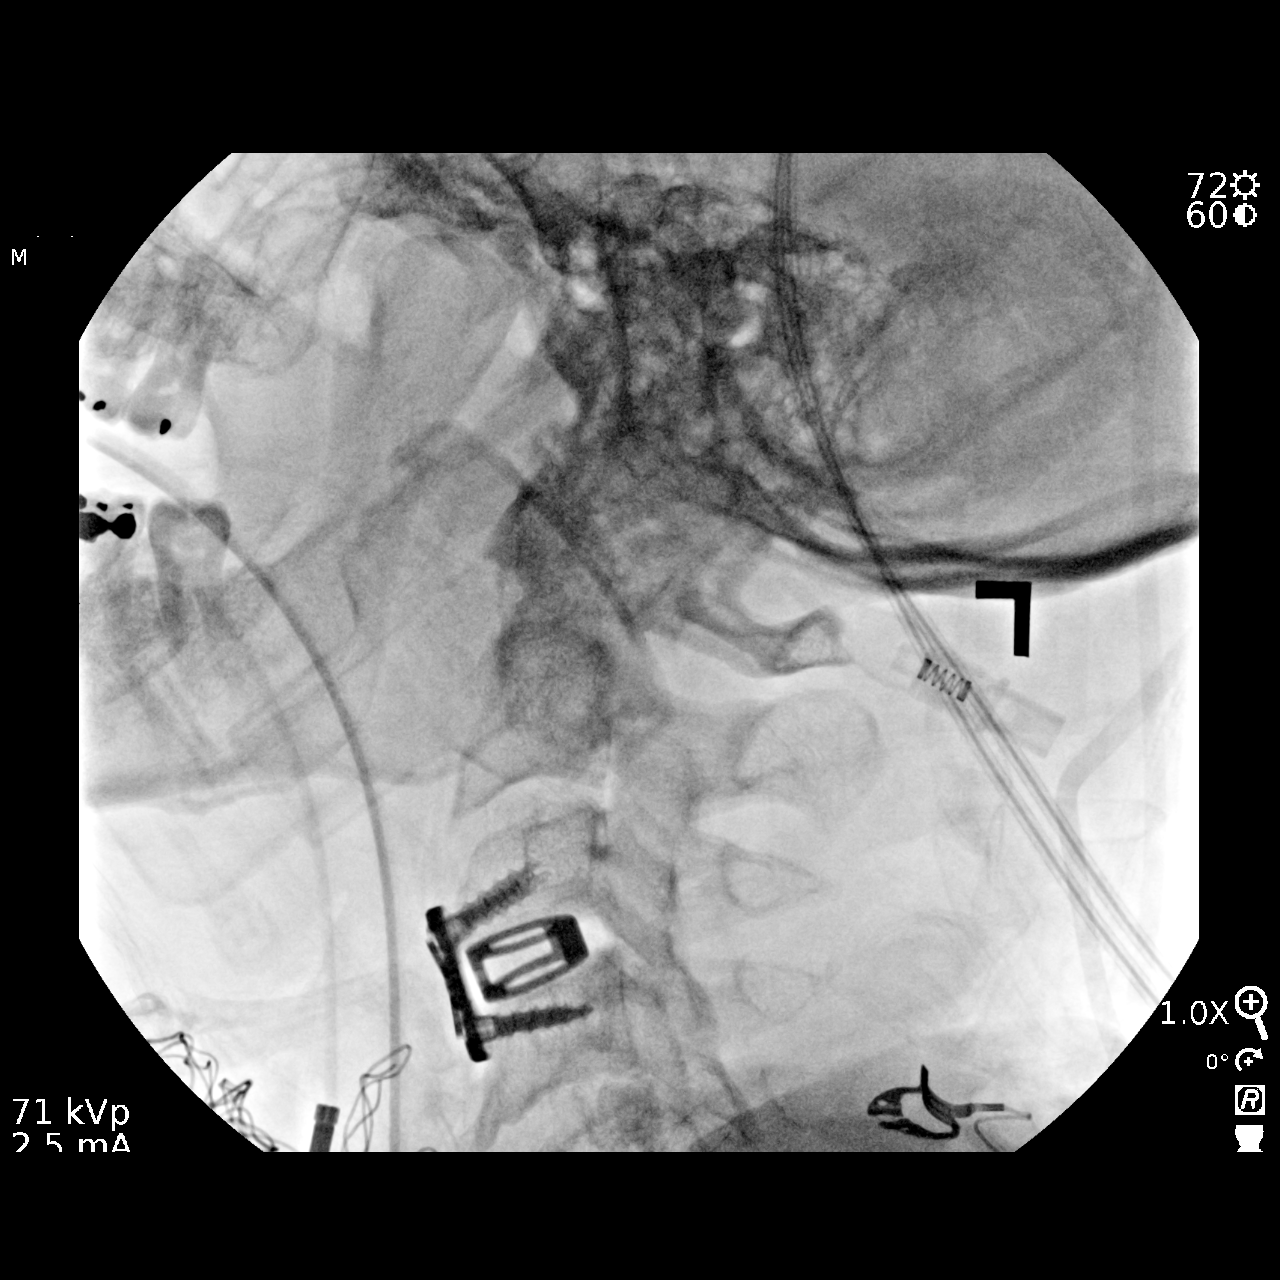
[im 2/2]
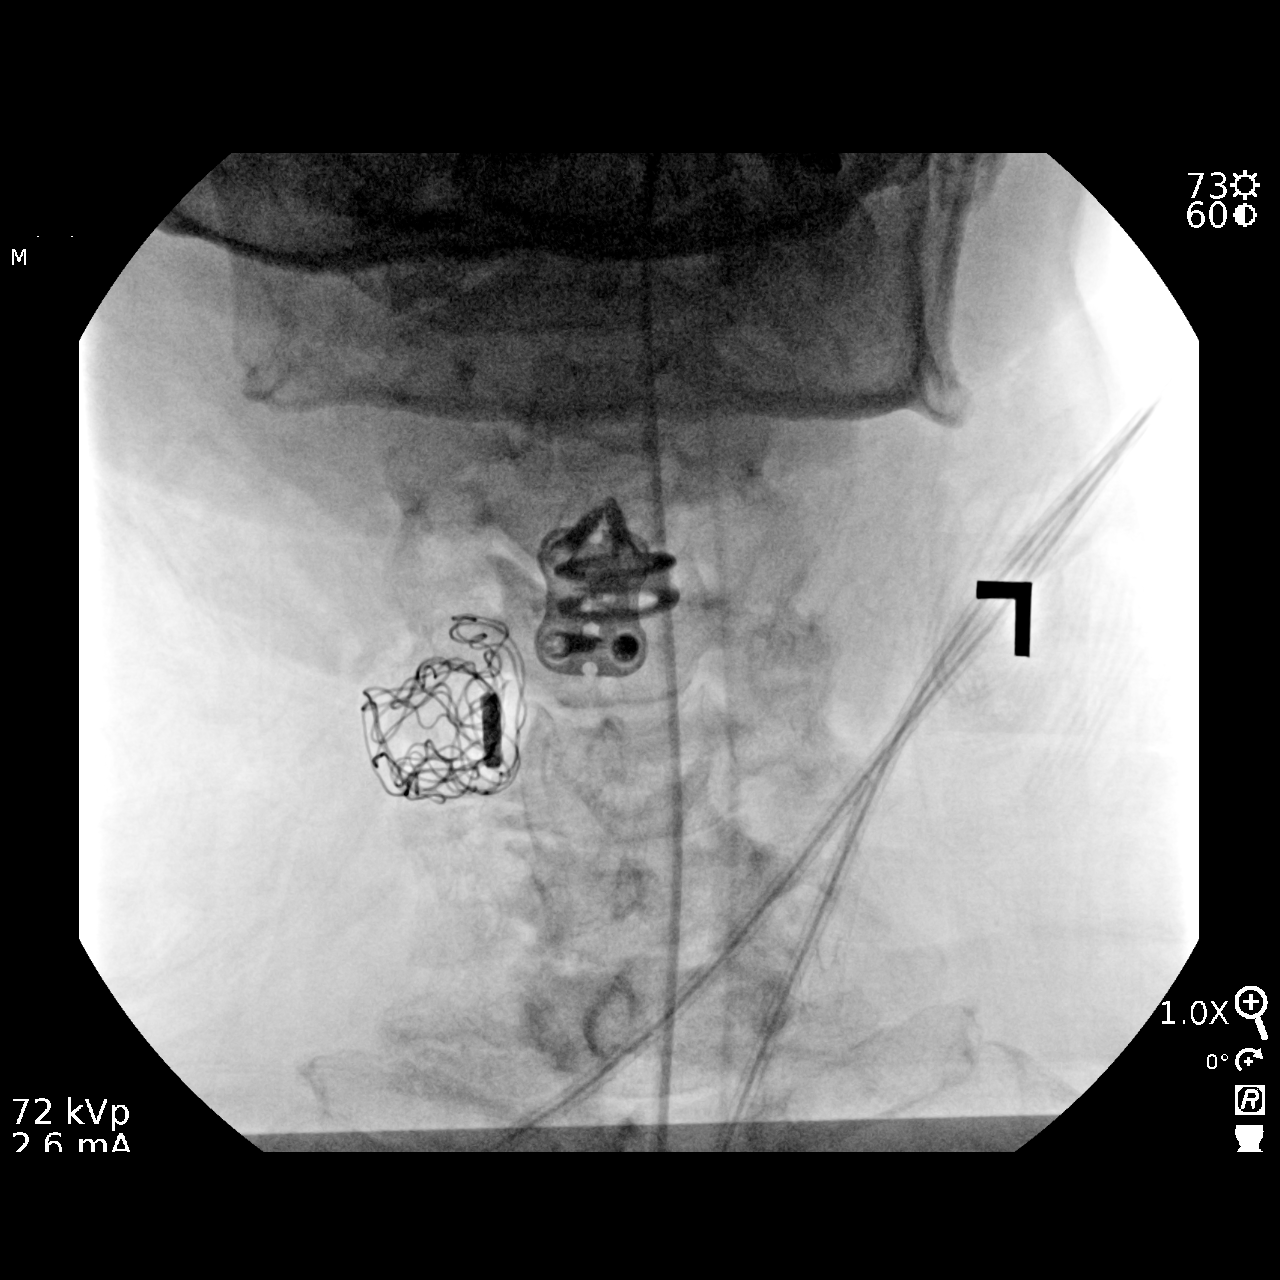

[2 of 2 positions shown; findings below may reference images not displayed]

FINDINGS: Fluoro time: 9 seconds.

Reported radiation: 0.59 mGy.

Two C-arm fluoroscopic images were obtained intraoperatively and
submitted for post operative interpretation. These images
demonstrate surgical changes of C3-C4 ACDF with intervening spacer.
Curvilinear foreign body material projects along the anterior right
neck, possibly gauze that is external to the patient. Please see the
performing provider's procedural report for further detail.
IMPRESSION: Intraoperative fluoroscopic imaging, as detailed above

## 2020-09-20 SURGERY — ANTERIOR CERVICAL DECOMPRESSION/DISCECTOMY FUSION 1 LEVEL
Anesthesia: General | Site: Spine Cervical

## 2020-09-20 MED ORDER — LIDOCAINE-EPINEPHRINE 1 %-1:100000 IJ SOLN
INTRAMUSCULAR | Status: AC
  Filled 2020-09-20: qty 1

## 2020-09-20 MED ORDER — ONDANSETRON HCL 4 MG/2ML IJ SOLN: 4.0000 mg | Freq: Four times a day (QID) | INTRAMUSCULAR | Status: DC | PRN

## 2020-09-20 MED ORDER — OXYCODONE-ACETAMINOPHEN 5-325 MG PO TABS: 1.0000 | ORAL_TABLET | ORAL | 0 refills | Status: DC | PRN

## 2020-09-20 MED ORDER — PHENYLEPHRINE HCL-NACL 10-0.9 MG/250ML-% IV SOLN
INTRAVENOUS | Status: DC | PRN
  Administered 2020-09-20: 25 ug/min via INTRAVENOUS

## 2020-09-20 MED ORDER — FENTANYL CITRATE (PF) 250 MCG/5ML IJ SOLN
INTRAMUSCULAR | Status: AC
  Filled 2020-09-20: qty 5

## 2020-09-20 MED ORDER — PROPOFOL 10 MG/ML IV BOLUS
INTRAVENOUS | Status: DC | PRN
  Administered 2020-09-20: 150 mg via INTRAVENOUS

## 2020-09-20 MED ORDER — FENTANYL CITRATE (PF) 100 MCG/2ML IJ SOLN
25.0000 ug | INTRAMUSCULAR | Status: DC | PRN
Start: 2020-09-20 — End: 2020-09-21

## 2020-09-20 MED ORDER — SUGAMMADEX SODIUM 200 MG/2ML IV SOLN
INTRAVENOUS | Status: DC | PRN
  Administered 2020-09-20: 200 mg via INTRAVENOUS

## 2020-09-20 MED ORDER — LACTATED RINGERS IV SOLN: INTRAVENOUS | Status: DC

## 2020-09-20 MED ORDER — ONDANSETRON HCL 4 MG/2ML IJ SOLN
INTRAMUSCULAR | Status: DC | PRN
  Administered 2020-09-20: 4 mg via INTRAVENOUS

## 2020-09-20 MED ORDER — THROMBIN 5000 UNITS EX SOLR
OROMUCOSAL | Status: DC | PRN
  Administered 2020-09-20: 5 mL via TOPICAL

## 2020-09-20 MED ORDER — LIDOCAINE-EPINEPHRINE 1 %-1:100000 IJ SOLN
INTRAMUSCULAR | Status: DC | PRN
  Administered 2020-09-20: 4 mL

## 2020-09-20 MED ORDER — MIDAZOLAM HCL 5 MG/5ML IJ SOLN
INTRAMUSCULAR | Status: DC | PRN
  Administered 2020-09-20: 2 mg via INTRAVENOUS

## 2020-09-20 MED ORDER — ROCURONIUM BROMIDE 10 MG/ML (PF) SYRINGE
PREFILLED_SYRINGE | INTRAVENOUS | Status: AC
  Filled 2020-09-20: qty 10

## 2020-09-20 MED ORDER — 0.9 % SODIUM CHLORIDE (POUR BTL) OPTIME
TOPICAL | Status: DC | PRN
  Administered 2020-09-20: 1000 mL

## 2020-09-20 MED ORDER — CEFAZOLIN SODIUM-DEXTROSE 2-4 GM/100ML-% IV SOLN
2.0000 g | INTRAVENOUS | Status: AC
  Administered 2020-09-20: 2 g via INTRAVENOUS
  Filled 2020-09-20: qty 100

## 2020-09-20 MED ORDER — ROCURONIUM BROMIDE 10 MG/ML (PF) SYRINGE
PREFILLED_SYRINGE | INTRAVENOUS | Status: DC | PRN
  Administered 2020-09-20: 10 mg via INTRAVENOUS
  Administered 2020-09-20: 20 mg via INTRAVENOUS
  Administered 2020-09-20: 60 mg via INTRAVENOUS

## 2020-09-20 MED ORDER — OXYCODONE HCL 5 MG PO TABS
5.0000 mg | ORAL_TABLET | Freq: Once | ORAL | Status: DC | PRN
Start: 2020-09-20 — End: 2020-09-21

## 2020-09-20 MED ORDER — ORAL CARE MOUTH RINSE: 15.0000 mL | Freq: Once | OROMUCOSAL | Status: AC

## 2020-09-20 MED ORDER — PROPOFOL 10 MG/ML IV BOLUS
INTRAVENOUS | Status: AC
  Filled 2020-09-20: qty 40

## 2020-09-20 MED ORDER — THROMBIN 5000 UNITS EX SOLR
CUTANEOUS | Status: AC
  Filled 2020-09-20: qty 5000

## 2020-09-20 MED ORDER — FENTANYL CITRATE (PF) 100 MCG/2ML IJ SOLN
INTRAMUSCULAR | Status: DC | PRN
  Administered 2020-09-20 (×2): 50 ug via INTRAVENOUS
  Administered 2020-09-20: 100 ug via INTRAVENOUS

## 2020-09-20 MED ORDER — CHLORHEXIDINE GLUCONATE CLOTH 2 % EX PADS: 6.0000 | MEDICATED_PAD | Freq: Once | CUTANEOUS | Status: DC

## 2020-09-20 MED ORDER — ONDANSETRON HCL 4 MG/2ML IJ SOLN
INTRAMUSCULAR | Status: AC
  Filled 2020-09-20: qty 2

## 2020-09-20 MED ORDER — DEXAMETHASONE SODIUM PHOSPHATE 10 MG/ML IJ SOLN
INTRAMUSCULAR | Status: AC
  Filled 2020-09-20: qty 1

## 2020-09-20 MED ORDER — OXYCODONE HCL 5 MG/5ML PO SOLN
5.0000 mg | Freq: Once | ORAL | Status: DC | PRN
Start: 2020-09-20 — End: 2020-09-21

## 2020-09-20 MED ORDER — LIDOCAINE 2% (20 MG/ML) 5 ML SYRINGE
INTRAMUSCULAR | Status: DC | PRN
  Administered 2020-09-20: 60 mg via INTRAVENOUS

## 2020-09-20 MED ORDER — MIDAZOLAM HCL 2 MG/2ML IJ SOLN
INTRAMUSCULAR | Status: AC
  Filled 2020-09-20: qty 2

## 2020-09-20 MED ORDER — DOCUSATE SODIUM 100 MG PO CAPS: 100.0000 mg | ORAL_CAPSULE | Freq: Two times a day (BID) | ORAL | 2 refills | Status: DC

## 2020-09-20 MED ORDER — LIDOCAINE 2% (20 MG/ML) 5 ML SYRINGE
INTRAMUSCULAR | Status: AC
  Filled 2020-09-20: qty 5

## 2020-09-20 MED ORDER — CHLORHEXIDINE GLUCONATE 0.12 % MT SOLN
15.0000 mL | Freq: Once | OROMUCOSAL | Status: AC
  Administered 2020-09-20: 15 mL via OROMUCOSAL
  Filled 2020-09-20: qty 15

## 2020-09-20 MED ORDER — DEXAMETHASONE SODIUM PHOSPHATE 4 MG/ML IJ SOLN
INTRAMUSCULAR | Status: DC | PRN
  Administered 2020-09-20: 10 mg via INTRAVENOUS

## 2020-09-20 SURGICAL SUPPLY — 69 items
APL SKNCLS STERI-STRIP NONHPOA (GAUZE/BANDAGES/DRESSINGS) ×1
BAND INSRT 18 STRL LF DISP RB (MISCELLANEOUS) ×2
BAND RUBBER #18 3X1/16 STRL (MISCELLANEOUS) ×6 IMPLANT
BASKET BONE COLLECTION (BASKET) ×1 IMPLANT
BENZOIN TINCTURE PRP APPL 2/3 (GAUZE/BANDAGES/DRESSINGS) ×3 IMPLANT
BIT DRILL NEURO 2X3.1 SFT TUCH (MISCELLANEOUS) ×1 IMPLANT
BLADE CLIPPER SURG (BLADE) IMPLANT
BLADE SURG 15 STRL LF DISP TIS (BLADE) IMPLANT
BLADE SURG 15 STRL SS (BLADE)
BLADE ULTRA TIP 2M (BLADE) IMPLANT
BUR RND DIAMOND ELITE 4.0 (BURR) ×1 IMPLANT
BUR RND DIAMOND ELITE 4.0MM (BURR) ×1
CANISTER SUCT 3000ML PPV (MISCELLANEOUS) ×3 IMPLANT
CLOSURE WOUND 1/2 X4 (GAUZE/BANDAGES/DRESSINGS) ×1
COLLAR CERV LO CONTOUR FIRM DE (SOFTGOODS) ×3 IMPLANT
COVER WAND RF STERILE (DRAPES) ×3 IMPLANT
DECANTER SPIKE VIAL GLASS SM (MISCELLANEOUS) ×3 IMPLANT
DEVICE ENDSKLTN IMPL 16X14X7X6 (Cage) IMPLANT
DRAPE C-ARM 42X72 X-RAY (DRAPES) ×5 IMPLANT
DRAPE C-ARMOR (DRAPES) ×1 IMPLANT
DRAPE LAPAROTOMY 100X72 PEDS (DRAPES) ×3 IMPLANT
DRAPE MICROSCOPE LEICA (MISCELLANEOUS) ×3 IMPLANT
DRAPE SHEET LG 3/4 BI-LAMINATE (DRAPES) ×3 IMPLANT
DRILL NEURO 2X3.1 SOFT TOUCH (MISCELLANEOUS) ×3
DRSG OPSITE 4X5.5 SM (GAUZE/BANDAGES/DRESSINGS) ×2 IMPLANT
DRSG OPSITE POSTOP 3X4 (GAUZE/BANDAGES/DRESSINGS) ×3 IMPLANT
DRSG TELFA 3X8 NADH (GAUZE/BANDAGES/DRESSINGS) ×3 IMPLANT
DURAPREP 26ML APPLICATOR (WOUND CARE) ×3 IMPLANT
ELECT COATED BLADE 2.86 ST (ELECTRODE) ×3 IMPLANT
ELECT REM PT RETURN 9FT ADLT (ELECTROSURGICAL) ×3
ELECTRODE REM PT RTRN 9FT ADLT (ELECTROSURGICAL) ×1 IMPLANT
ENDOSKELETON IMPLANT 16X14X7X6 (Cage) ×3 IMPLANT
GAUZE 4X4 16PLY RFD (DISPOSABLE) IMPLANT
GLOVE BIOGEL M 6.5 STRL (GLOVE) ×10 IMPLANT
GLOVE BIOGEL PI IND STRL 7.5 (GLOVE) ×1 IMPLANT
GLOVE BIOGEL PI INDICATOR 7.5 (GLOVE) ×2
GLOVE ECLIPSE 7.5 STRL STRAW (GLOVE) ×3 IMPLANT
GOWN STRL REUS W/ TWL LRG LVL3 (GOWN DISPOSABLE) ×2 IMPLANT
GOWN STRL REUS W/ TWL XL LVL3 (GOWN DISPOSABLE) ×1 IMPLANT
GOWN STRL REUS W/TWL 2XL LVL3 (GOWN DISPOSABLE) IMPLANT
GOWN STRL REUS W/TWL LRG LVL3 (GOWN DISPOSABLE) ×6
GOWN STRL REUS W/TWL XL LVL3 (GOWN DISPOSABLE) ×3
HEMOSTAT POWDER KIT SURGIFOAM (HEMOSTASIS) ×3 IMPLANT
KIT BASIN OR (CUSTOM PROCEDURE TRAY) ×3 IMPLANT
KIT TURNOVER KIT B (KITS) ×3 IMPLANT
NDL SPNL 22GX3.5 QUINCKE BK (NEEDLE) ×1 IMPLANT
NEEDLE HYPO 22GX1.5 SAFETY (NEEDLE) ×3 IMPLANT
NEEDLE SPNL 22GX3.5 QUINCKE BK (NEEDLE) ×3 IMPLANT
NS IRRIG 1000ML POUR BTL (IV SOLUTION) ×3 IMPLANT
PACK LAMINECTOMY NEURO (CUSTOM PROCEDURE TRAY) ×3 IMPLANT
PAD ARMBOARD 7.5X6 YLW CONV (MISCELLANEOUS) ×9 IMPLANT
PAD DRESSING TELFA 3X8 NADH (GAUZE/BANDAGES/DRESSINGS) IMPLANT
PATTIES SURGICAL .5 X3 (DISPOSABLE) ×1 IMPLANT
PIN DISTRACTION 14MM (PIN) ×6 IMPLANT
PLATE ZEVO 1LVL 19MM (Plate) ×2 IMPLANT
PUTTY DBF 1CC CORTICAL FIBERS (Putty) ×2 IMPLANT
SCREW 3.5 SELFDRILL 15MM VARI (Screw) ×8 IMPLANT
SPONGE INTESTINAL PEANUT (DISPOSABLE) ×3 IMPLANT
SPONGE SURGIFOAM ABS GEL SZ50 (HEMOSTASIS) IMPLANT
STAPLER VISISTAT 35W (STAPLE) ×3 IMPLANT
STRIP CLOSURE SKIN 1/2X4 (GAUZE/BANDAGES/DRESSINGS) ×2 IMPLANT
SUT MNCRL AB 4-0 PS2 18 (SUTURE) ×3 IMPLANT
SUT SILK 2 0 TIES 10X30 (SUTURE) ×2 IMPLANT
SUT VIC AB 2-0 CT1 18 (SUTURE) ×2 IMPLANT
SUT VIC AB 3-0 SH 8-18 (SUTURE) ×3 IMPLANT
TAPE CLOTH 3X10 TAN LF (GAUZE/BANDAGES/DRESSINGS) ×3 IMPLANT
TOWEL GREEN STERILE (TOWEL DISPOSABLE) ×3 IMPLANT
TOWEL GREEN STERILE FF (TOWEL DISPOSABLE) ×3 IMPLANT
WATER STERILE IRR 1000ML POUR (IV SOLUTION) ×3 IMPLANT
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
## 2020-09-20 NOTE — Op Note (Signed)
PREOP DIAGNOSIS: Severe cervical stenosis with myelopathy  POSTOP DIAGNOSIS: Severe cervical stenosis with myelopathy   PROCEDURE: 1. Arthrodesis C3-4, anterior interbody technique, including Discectomy for decompression of spinal cord and exiting nerve roots with foraminotomies  2. Placement of intervertebral biomechanical device at C3-4 3. Placement of anterior instrumentation consisting of interbody plate and screws N3-Z7 4. Use of morselized bone allograft  5. Use of intraoperative microscope  SURGEON: Dr. Duffy Rhody, MD  ASSISTANT: Sherley Bounds, MD.  Please note, no qualified trainees were available to assist with the procedure.  Assistance was required for retraction of the visceral structures to safely allow for instrumentation.  ANESTHESIA: General Endotracheal  EBL: 25 ml  IMPLANTS: Medtronic Titan C 7 x 16 x 14 medium cage 19 mm Zevo Plate 15 mm screws x 4 DBF allograft  SPECIMENS: None  DRAINS: None  COMPLICATIONS: None immediate  CONDITION: Hemodynamically stable to PACU  HISTORY: [name] is a 60 y.o. y.o. male who developed progressive myelopathy and was found to have multilevel degenerative disease in his cervical spine, including severe stenosis at C3-4 with associated cord signal change and myelomalacia.  Risks, benefits, alternatives, and expected convalescence were discussed with the patient.  Risks discussed included but were not limited to bleeding, pain, infection, pseudoarthrosis, hardware failure, adjacent segment disease, CSF leak, neurologic deficits, weakness, numbness, paralysis, coma, and death. After all questions were answered, informed consent was obtained.  PROCEDURE IN DETAIL: The patient was brought to the operating room and transferred to the operative table. After induction of general anesthesia, the patient was positioned on the operative table in the supine position with all pressure points meticulously padded. The skin of the  neck was then prepped and draped in the usual sterile fashion.  After timeout was conducted, the skin was infiltrated with local anesthetic. Skin incision was then made sharply and Bovie electrocautery was used to dissect the subcutaneous tissue until the platysma was identified. The platysma was then divided and undermined. The sternocleidomastoid muscle was then identified and, utilizing natural fascial planes in the neck, the prevertebral fascia was identified and the carotid sheath was retracted laterally and the trachea and esophagus retracted medially. Again using fluoroscopy, the C3-4 disc space was identified. Bovie electrocautery was used to dissect in the subperiosteal plane and elevate the bilateral longus coli muscles. Self-retaining retractors were then placed. Caspar distraction pins were placed in the adjacent bodies to allow for gentle distraction.  At this point, the microscope was draped and brought into the field, and the remainder of the case was done under the microscope using microdissecting technique.  The disc space was incised sharply and combination of high speed drill, curettes, and rongeurs were use to initially complete a discectomy. The high-speed drill was then used to complete discectomy until the posterior annulus was identified and removed and the posterior longitudinal ligament was identified. Using a nerve hook, the PLL was elevated, and Kerrison rongeurs were used to remove the posterior longitudinal ligament and the ventral thecal sac was identified. Using a combination of curettes and rongeurs, complete decompression of the thecal sac and exiting nerve roots at this level was completed, and verified with easy passage of micro-nerve hook centrally and in the bilateral foramina.  Having completed our decompression, attention was turned to placement of the intervertebral device. Trial spacers were used to select a size 7 mm graft. This graft was then filled with morcellized  allograft, and inserted under live fluoroscopy.  After placement of the intervertebral  device, the caspar pins were removed.  An anterior cervical plate was placed across the interspaces for anterior fixation.  Using a high-speed drill, the cortex of the cervical vertebral bodies was punctured, and screws inserted in the vertebral bodies. Final fluoroscopic images in AP and lateral projections were taken to confirm good hardware placement.  At this point, after all counts were verified to be correct, meticulous hemostasis was secured using a combination of bipolar electrocautery and passive hemostatics. The platysma muscle was then closed using interrupted 3-0 Vicryl sutures, and the skin was closed with a 4-0 monocryl in subcuticular fashion. Sterile dressings were then applied and the drapes removed.  The patient tolerated the procedure well and was extubated in the room and taken to the postanesthesia care unit in stable condition.  All counts were correct at the end of the procedure.

## 2020-09-20 NOTE — Anesthesia Procedure Notes (Signed)
Procedure Name: Intubation Date/Time: 09/20/2020 7:48 AM Performed by: Candis Shine, CRNA Pre-anesthesia Checklist: Patient identified, Emergency Drugs available, Suction available and Patient being monitored Patient Re-evaluated:Patient Re-evaluated prior to induction Oxygen Delivery Method: Circle System Utilized Preoxygenation: Pre-oxygenation with 100% oxygen Induction Type: IV induction Ventilation: Mask ventilation without difficulty Laryngoscope Size: Glidescope and 4 Grade View: Grade I Tube type: Oral Tube size: 7.5 mm Number of attempts: 1 Airway Equipment and Method: Oral airway,  Video-laryngoscopy and Rigid stylet Placement Confirmation: ETT inserted through vocal cords under direct vision,  positive ETCO2 and breath sounds checked- equal and bilateral Secured at: 23 cm Tube secured with: Tape Dental Injury: Teeth and Oropharynx as per pre-operative assessment  Difficulty Due To: Difficult Airway- due to reduced neck mobility Comments: Elective glidescope intubation d/t reduced neck mobility/cervical myelopathy. Head and neck maintained in neutral position throughout.

## 2020-09-20 NOTE — Progress Notes (Signed)
This 60 year old male presents for neck pain. HISTORY OF PRESENT ILLNESS: [phone] [facility] Office Visit Dorcas Carrow. Liliana Brentlinger MD [birth year] 07/26/2020 09:45 AM  1. neckpain This is a 60 year old man who was a long-time smoker who presents for evaluation of neck pain and walking difficulties. He has had neck pain since 2021 and has noted since then he has had significant issues with walking and stumbling. H is neck pain is at the base of his posterior spine. He notices pain when he extends his neck but not when he bends it. He did physical therapy which did not seem to help. He had an MRI which showed multilevel advanced degenerative disease, including myelomalacia and severe stenosis at C3-4 with cord impingement. He smokes 1 and half packs per day for 40 years. He does not have any heart disease and is not on a blood thinner. PAST MED ICAL/SURGICAL HISTORY: (Detailed) Management  Disease/disorder Anxiety Hypertension Depression Seizure disorder Family History: Onset Date Date Comments  (Detailed) Koty, Anctil [id number] January 15, 1961 07/26/2020 09:45 AM Page: 1/6  Social History: (Detailed) Tobacco use reviewed. Preferred language is [place]. Tobacco use status: Occasional cigarette smoker. Smoking status: Heavy tobacco  smoker. SMOKING STATUS Type Smoking Status Cigarette Heavy tobacco smoker TOBACCO CESSATION INFORMATION Usage Per Day 35 Cigarettes Years Used Total Pack Years Smoking cessation education Stopped  Date CounseledBy Order Status Description Code  Tobacco Cessation Information  07/26/2020 Donald Siva CMA Tobacco cessation counseling MEDICATIONS: (added, continued or stopped this visit) completed< BR>Started Medication amlodipine 5 mg tablet atorvastatin 10 mg tablet diclofenac sodium 75 mg tablet,delayed release hydroxyzineHCl25 mg tablet levothyroxine 175 mcg tablet methocarbamol 500 mg tablet quetiapine 100 mg tablet sertraline 100 mg  tablet Tegretol 200 mg tablet Directions Instruction  ALLERGIES: Ingredient DIPHENHYDRAMIN E Reviewed, updated. Reaction Medication Name Comment ta ke 1 tablet by oral route every day take 1 tablet by oral route every day take 1 tablet by oral route 2 times every day take1tabletbyoralroute3 times every day as needed take 1 tablet by oral route every day take 2 tablet by oral route 4 times every day take 1 tablet by oral route 2 times every day take 1 tablet by oral route every day take 1 tablet by oral route every 12 hours  Ovidio, Steele [id number] 07 /01/1961 07/26/2020 09:45 AM Page: 2/6  PHYSICAL EXAM: Vitals Date Temp F BP Pulse 07/26/2020 146/84 72 PHYSICAL EXAM Details Ht In 68 Left Wt Lb BMI 183.4 27.89 BSA Pain Score 7/10   General Level of Distress: Overall Appearance: Head and Face Fundoscopic Exam: Cardiovascular Peripheral Pulses: Carotid Pulses: Respiratory Lungs: Neurological no acute distress Norma l Right unable to visualize unable to visualize Orientation: Recent and Remote Memory: Attention Span and Concentration: Language: Fund of Knowledge: Sensation: Upper Extremity Coordination: Lower Extremity Coordination: Musculoskeletal Gait and Station: Upper Extremity Muscle Tone: Lower Extremity Muscle Tone: Motor Strength Left normal normal normal Left . Any abnormal findings will be n oted below. Right Left Right normal non-labored Left normal normal normal normal normal normal normal Right normal normal normal normal Right normal normal Deltoid: 5/5 Biceps: 5/5 Cashawn, Yanko [id number] Mar 20, 1961 07/26/2020 09:45 AM Page: 3/6 5/5 4-/5  Triceps: Wrist Extensor: Grip: Finger Extensor: Hip Flexor: Knee Extensor: Knee Flexor: Tib Anteri or: EHL: Medial Gastroc: Gaze Normal 5/5 5/5 5/5 5/5 4+/5 4+/5 5/5 5/5 5/5 5/5 5/5 5/5 5/5 5/5 5/5 5/5 5/5 5/5 5/5 5/5 normal Sensation was tested at C2 to T1 and L1 to  S1. Horizontal Gaze Stability: Sensory Cranial Nerves II. Optic Nerve/Visual Fields: III. Oculomotor: IV. Trochlear: V. Trigeminal: normal EOMI EOMI sensory intact EOMI no facial droop hearing intact palate elevation symmet ric no hoarseness shoulder shrug full tongue protrusion midline Left VI. Abducens: VII. Facial: VIII. Acoustic/Vestibular: IX. Glossopharyngeal: X. Vagus: XI. Spinal Accessory: XII. Hypoglossal: Motor and other Tests Spurlings Additional Findings: Right negative negative Positive Hoffmann's bilaterally. 3+ biceps, brachioradialis, triceps reflexes. 2+ patellar and Achilles reflexes. Walks wi th cane with wide-based gait. DIAGNOSTIC RESULTS: X-ray cervical spine is reviewed. There is multilevel degenerative disc disease with advanced disc degeneration worst at C3-4, C5-6, and C6-7. His MRI from July 16, 2020 was reviewed. There is severe cervical stenosis with myelomalacia and cord signal change at C3-4. At C4-5, there is mild to moderate stenosis. At C5-6 and C6-7, there is moderate to severe bilateral foraminal s tenosis with mild central stenosis.  Smayan, Hackbart [id number] May 20, 1961 07/26/2020 09:45 AM Page: 4/6  IMPRESSION: This is a 60 year old man with longstanding smoking history who has severe cervical spondylosis with myelopathy. PLAN: He has cervical myelopathy with significant myelomalacia, so I am recommending surgery to give his spinal cord thebestchanceofrecovery. Givenhislongstandingsmokinghistory,attemptin gtoaddressallhavehisdiseased levelswouldincursignificantriskofpseudoarthrosisandsurgicalfailure. Additionally,hisradiculopathic symptoms are somewhat mild and he he has cord impingement only at C3-4. Therefore, I believe a targeted ACDF at C3-4 I think would give the best balance of benefit as well as minimization of long-term risks. He certainly would be at risk of requiring additional surgery in the future. I had discussed with him  th e importance of smoking cessation for this surgery but also for spine health preservation. Risks, benefits, alternatives, expectedconvalescencewerediscussed. Hewishestoproceedwithsurgery. Allquestionsandconcernswere answered.

## 2020-09-20 NOTE — Transfer of Care (Signed)
Immediate Anesthesia Transfer of Care Note  Patient: [name]  Procedure(s) Performed: ANTERIOR CERVICAL DECOMPRESSION/DISCECTOMY FUSION CERVICAL THREE- CERVICAL FOUR (N/A Spine Cervical)  Patient Location: PACU  Anesthesia Type:General  Level of Consciousness: drowsy  Airway & Oxygen Therapy: Patient Spontanous Breathing and Patient connected to face mask oxygen  Post-op Assessment: Report given to RN and Post -op Vital signs reviewed and stable  Post vital signs: Reviewed and stable  Last Vitals:  Vitals Value Taken Time  BP 139/79 09/20/20 0946  Temp    Pulse 71 09/20/20 0947  Resp 17 09/20/20 0947  SpO2 96 % 09/20/20 0947  Vitals shown include unvalidated device data.  Last Pain:  Vitals:   09/20/20 0601  TempSrc:   PainSc: 3       Patients Stated Pain Goal: 3 (84/41/71 2787)  Complications: No complications documented.

## 2020-09-20 NOTE — Anesthesia Preprocedure Evaluation (Signed)
Anesthesia Evaluation  Patient identified by MRN, date of birth, ID band Patient awake    Reviewed: Allergy & Precautions, H&P , NPO status , Patient's Chart, lab work & pertinent test results  Airway Mallampati: II   Neck ROM: full    Dental   Pulmonary sleep apnea , Current Smoker and Patient abstained from smoking.,    breath sounds clear to auscultation       Cardiovascular hypertension,  Rhythm:regular Rate:Normal     Neuro/Psych  Headaches, Seizures -,  PSYCHIATRIC DISORDERS Anxiety Depression Bipolar Disorder    GI/Hepatic   Endo/Other  Hypothyroidism   Renal/GU      Musculoskeletal  (+) Arthritis ,   Abdominal   Peds  Hematology   Anesthesia Other Findings   Reproductive/Obstetrics                             Anesthesia Physical Anesthesia Plan  ASA: II  Anesthesia Plan: General   Post-op Pain Management:    Induction: Intravenous  PONV Risk Score and Plan: 1 and Ondansetron, Dexamethasone, Midazolam and Treatment may vary due to age or medical condition  Airway Management Planned: Oral ETT  Additional Equipment:   Intra-op Plan:   Post-operative Plan: Extubation in OR  Informed Consent: I have reviewed the patients History and Physical, chart, labs and discussed the procedure including the risks, benefits and alternatives for the proposed anesthesia with the patient or authorized representative who has indicated his/her understanding and acceptance.       Plan Discussed with: CRNA, Anesthesiologist and Surgeon  Anesthesia Plan Comments:         Anesthesia Quick Evaluation

## 2020-09-20 NOTE — Discharge Instructions (Addendum)
Can remove transparent dressing and shower 48 hours after surgery Walk as much as possible No heavy lifting >10 lbs No overhead activity or repetitive bending or twisting of the neck Wear soft collar at all times, may remove for showers

## 2020-09-21 ENCOUNTER — Telehealth: Payer: Self-pay

## 2020-09-21 NOTE — Telephone Encounter (Signed)
Form typed up to be given to provider

## 2020-09-21 NOTE — Anesthesia Postprocedure Evaluation (Signed)
Anesthesia Post Note  Patient: [name]  Procedure(s) Performed: ANTERIOR CERVICAL DECOMPRESSION/DISCECTOMY FUSION CERVICAL THREE- CERVICAL FOUR (N/A Spine Cervical)     Patient location during evaluation: PACU Anesthesia Type: General Level of consciousness: awake and alert Pain management: pain level controlled Vital Signs Assessment: post-procedure vital signs reviewed and stable Respiratory status: spontaneous breathing, nonlabored ventilation, respiratory function stable and patient connected to nasal cannula oxygen Cardiovascular status: blood pressure returned to baseline and stable Postop Assessment: no apparent nausea or vomiting Anesthetic complications: no   No complications documented.  Last Vitals:  Vitals:   09/20/20 1115 09/20/20 1145  BP: 132/74 128/83  Pulse: 71 70  Resp: (!) 26 14  Temp:    SpO2: 91% 96%    Last Pain:  Vitals:   09/20/20 1145  TempSrc:   PainSc: 0-No pain                 Anel Creighton S

## 2020-09-22 ENCOUNTER — Encounter (HOSPITAL_COMMUNITY): Payer: Self-pay | Admitting: Neurosurgery

## 2020-09-24 DIAGNOSIS — Z0289 Encounter for other administrative examinations: Secondary | ICD-10-CM

## 2020-09-24 NOTE — Telephone Encounter (Signed)
Pt picked up form

## 2020-10-13 ENCOUNTER — Telehealth (INDEPENDENT_AMBULATORY_CARE_PROVIDER_SITE_OTHER): Payer: Medicaid Other | Admitting: Psychiatry

## 2020-10-13 ENCOUNTER — Other Ambulatory Visit: Payer: Self-pay

## 2020-10-13 DIAGNOSIS — F3170 Bipolar disorder, currently in remission, most recent episode unspecified: Secondary | ICD-10-CM

## 2020-10-13 DIAGNOSIS — F1221 Cannabis dependence, in remission: Secondary | ICD-10-CM

## 2020-10-13 DIAGNOSIS — F411 Generalized anxiety disorder: Secondary | ICD-10-CM | POA: Diagnosis not present

## 2020-10-13 MED ORDER — QUETIAPINE FUMARATE ER 200 MG PO TB24: 200.0000 mg | ORAL_TABLET | Freq: Every day | ORAL | 5 refills | Status: DC

## 2020-10-13 MED ORDER — HYDROXYZINE HCL 25 MG PO TABS: 25.0000 mg | ORAL_TABLET | Freq: Two times a day (BID) | ORAL | 2 refills | Status: AC | PRN

## 2020-10-13 MED ORDER — CARBAMAZEPINE ER 200 MG PO CP12: 200.0000 mg | ORAL_CAPSULE | Freq: Two times a day (BID) | ORAL | 5 refills | Status: DC
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
## 2020-11-18 ENCOUNTER — Telehealth: Payer: Self-pay

## 2020-11-18 MED ORDER — AMLODIPINE BESYLATE 5 MG PO TABS: 5.0000 mg | ORAL_TABLET | Freq: Every day | ORAL | 0 refills | Status: DC

## 2020-11-18 NOTE — Telephone Encounter (Signed)
Patient needs refill of Amlodipine sent to [facility].  Refill sent in, follow up appointment scheduled on 12/03/20 at 1:35 pm with [name], patient aware.

## 2020-12-03 ENCOUNTER — Ambulatory Visit: Payer: Medicaid Other | Admitting: Family Medicine

## 2020-12-03 ENCOUNTER — Encounter: Payer: Self-pay | Admitting: Family Medicine

## 2020-12-03 ENCOUNTER — Other Ambulatory Visit: Payer: Self-pay

## 2020-12-03 VITALS — BP 131/82 | HR 72 | Temp 97.6°F | Ht 68.0 in | Wt 162.2 lb

## 2020-12-03 DIAGNOSIS — E538 Deficiency of other specified B group vitamins: Secondary | ICD-10-CM

## 2020-12-03 DIAGNOSIS — E039 Hypothyroidism, unspecified: Secondary | ICD-10-CM | POA: Diagnosis not present

## 2020-12-03 DIAGNOSIS — E782 Mixed hyperlipidemia: Secondary | ICD-10-CM | POA: Diagnosis not present

## 2020-12-03 DIAGNOSIS — Z1211 Encounter for screening for malignant neoplasm of colon: Secondary | ICD-10-CM

## 2020-12-03 DIAGNOSIS — I10 Essential (primary) hypertension: Secondary | ICD-10-CM

## 2020-12-03 MED ORDER — AMLODIPINE BESYLATE 5 MG PO TABS: 5.0000 mg | ORAL_TABLET | Freq: Every day | ORAL | 1 refills | Status: DC

## 2020-12-03 NOTE — Progress Notes (Signed)
Assessment & Plan:  1. Primary hypertension Well controlled on current regimen.  - amLODipine (NORVASC) 5 MG tablet; Take 1 tablet (5 mg total) by mouth daily.  Dispense: 90 tablet; Refill: 1 - CBC with Differential/Platelet - CMP14+EGFR - Lipid panel  2. Mixed hyperlipidemia Labs to assess. - CMP14+EGFR - Lipid panel  3. Acquired hypothyroidism Previously controlled. Labs to assess today. - T4, free - TSH  4. Vitamin B12 deficiency Labs to assess. Previously too low. - Vitamin B12  5. Colon cancer screening - Ambulatory referral to Gastroenterology   Return in about 6 months (around 06/04/2021) for annual physical.  [name], MSN, APRN, FNP-C [name]  Subjective:    Patient ID: [name], male    DOB: 12-27-1960, 60 y.o.   MRN: [id number]  Patient Care Team: Loman Brooklyn, FNP as PCP - General (Family Medicine) Amie Critchley [facility])   Chief Complaint:  Chief Complaint  Patient presents with  . Hypertension  . Hyperlipidemia    Check up of chronic medical conditions   . Headache    Since neck surgery x 2 months ago    HPI: [name] is a 60 y.o. male presenting on 12/03/2020 for Hypertension, Hyperlipidemia (Check up of chronic medical conditions ), and Headache (Since neck surgery x 2 months ago)  Psychiatrist, Dr. Montel Culver, manages his adjustment disorder with anxiety and depression, alcohol use disorder, cannabis use disorder, bipolar disorder, and insomnia with carbamazepine, hydroxyzine, Seroquel, and sertraline.  Hypertension: well managed.  Hypothyroidism: well managed.  New complaints: Patient reports he has had headaches since his neck surgery two months ago. He has followed up with his neurosurgeon once and is scheduled to return next week. States they discussed repeating imaging at that time.   Social history:  Relevant past medical, surgical, family and social history  reviewed and updated as indicated. Interim medical history since our last visit reviewed.  Allergies and medications reviewed and updated.  DATA REVIEWED: CHART IN EPIC  ROS: Negative unless specifically indicated above in HPI.    Current Outpatient Medications:  .  amLODipine (NORVASC) 5 MG tablet, Take 1 tablet (5 mg total) by mouth daily., Disp: 30 tablet, Rfl: 0 .  atorvastatin (LIPITOR) 10 MG tablet, TAKE 1 TABLET(10 MG) BY MOUTH DAILY, Disp: 30 tablet, Rfl: 5 .  carbamazepine (EQUETRO) 200 MG CP12 12 hr capsule, Take 1 capsule (200 mg total) by mouth 2 (two) times daily., Disp: 60 capsule, Rfl: 5 .  hydrOXYzine (ATARAX/VISTARIL) 25 MG tablet, Take 1 tablet (25 mg total) by mouth 2 (two) times daily as needed for anxiety., Disp: 60 tablet, Rfl: 2 .  levothyroxine (SYNTHROID) 175 MCG tablet, TAKE 1 TABLET(175 MCG) BY MOUTH DAILY AT 6 AM, Disp: 30 tablet, Rfl: 5 .  QUEtiapine (SEROQUEL XR) 200 MG 24 hr tablet, Take 1 tablet (200 mg total) by mouth at bedtime., Disp: 30 tablet, Rfl: 5 .  sertraline (ZOLOFT) 100 MG tablet, Take 1 tablet (100 mg total) by mouth daily., Disp: 90 tablet, Rfl: 1 No current facility-administered medications for this visit.  Facility-Administered Medications Ordered in Other Visits:  .  gadopentetate dimeglumine (MAGNEVIST) injection 15 mL, 15 mL, Intravenous, Once PRN, Sater, Nanine Means, MD   Allergies  Allergen Reactions  . Benadryl [Diphenhydramine] Other (See Comments)    "throws me for a loop". Seizure   Past Medical History:  Diagnosis Date  . Anxiety   . Anxiety and depression   .  Arthritis   . Depression   . Headache   . Hemorrhoids   . Hyperlipidemia 02/06/2020  . Hypertension   . Hypothyroidism   . OSA on CPAP    doesn't use often pt states   . Pneumonia   . Seizures ([place]) 02/2016   only with Benadryl use  . Suicide and self-inflicted injury ([place])   . Vitamin B12 deficiency 02/06/2020    Past Surgical History:  Procedure Laterality  Date  . ANTERIOR CERVICAL DECOMP/DISCECTOMY FUSION N/A 09/20/2020   Procedure: ANTERIOR CERVICAL DECOMPRESSION/DISCECTOMY FUSION CERVICAL THREE- CERVICAL FOUR;  Surgeon: Vallarie Mare, MD;  Location: [place];  Service: Neurosurgery;  Laterality: N/A;  . BICEPS TENDON REPAIR Right   . CARPAL TUNNEL RELEASE Bilateral   . DECORTICATION Left 11/12/2018   Procedure: DECORTICATION;  Surgeon: Ivin Poot, MD;  Location: [place];  Service: Thoracic;  Laterality: Left;  . KNEE CARTILAGE SURGERY Left   . KNEE SURGERY Right    x 2  . LAPAROTOMY N/A 11/03/2018   Procedure: EXPLORATORY LAPAROTOMY;  Surgeon: Erroll Luna, MD;  Location: [place];  Service: General;  Laterality: N/A;  . VIDEO ASSISTED THORACOSCOPY (VATS)/EMPYEMA Left 11/12/2018   Procedure: VIDEO ASSISTED THORACOSCOPY (VATS)/EMPYEMA, MINI THORACOTOMY;  Surgeon: Ivin Poot, MD;  Location: [place];  Service: Thoracic;  Laterality: Left;  needs cell saver    Social History   Socioeconomic History  . Marital status: Married    Spouse name: [name]  . Number of children: 1  . Years of education: 49  . Highest education level: 12th grade  Occupational History  . Occupation: Architect    Comment: remodels houses  . Occupation: Clinical research associate     Comment: quit last week   Tobacco Use  . Smoking status: Current Every Day Smoker    Packs/day: 1.50    Years: 51.00    Pack years: 76.50    Types: Cigarettes    Start date: 47  . Smokeless tobacco: Never Used  Vaping Use  . Vaping Use: Never used  Substance and Sexual Activity  . Alcohol use: Not Currently    Comment: none since March 2020  . Drug use: Not Currently    Frequency: 3.0 times per week    Types: Marijuana    Comment: 02/03/20- states no   . Sexual activity: Not Currently    Partners: Female    Birth control/protection: None  Other Topics Concern  . Not on file  Social History Narrative   ** Merged History Encounter **       Lives with wife Caffeine-  coffee 2 cups daily   Social Determinants of Health   Financial Resource Strain: Not on file  Food Insecurity: Not on file  Transportation Needs: Not on file  Physical Activity: Not on file  Stress: Not on file  Social Connections: Not on file  Intimate Partner Violence: Not on file        Objective:    BP 131/82   Pulse 72   Temp 97.6 F (36.4 C) (Temporal)   Ht _0  (1.727 m)   Wt 162 lb 3.2 oz (73.6 kg)   SpO2 99%   BMI 24.66 kg/m   Wt Readings from Last 3 Encounters:  12/03/20 162 lb 3.2 oz (73.6 kg)  09/20/20 169 lb (76.7 kg)  09/16/20 169 lb 1.6 oz (76.7 kg)    Physical Exam Vitals reviewed.  Constitutional:      General: He is not in acute  distress.    Appearance: Normal appearance. He is normal weight. He is not ill-appearing, toxic-appearing or diaphoretic.  HENT:     Head: Normocephalic and atraumatic.  Eyes:     General: No scleral icterus.       Right eye: No discharge.        Left eye: No discharge.     Conjunctiva/sclera: Conjunctivae normal.  Cardiovascular:     Rate and Rhythm: Normal rate and regular rhythm.     Heart sounds: Normal heart sounds. No murmur heard. No friction rub. No gallop.   Pulmonary:     Effort: Pulmonary effort is normal. No respiratory distress.     Breath sounds: Normal breath sounds. No stridor. No wheezing, rhonchi or rales.  Musculoskeletal:        General: Normal range of motion.     Cervical back: Normal range of motion.  Skin:    General: Skin is warm and dry.  Neurological:     Mental Status: He is alert and oriented to person, place, and time. Mental status is at baseline.  Psychiatric:        Mood and Affect: Mood normal.        Behavior: Behavior normal.        Thought Content: Thought content normal.        Judgment: Judgment normal.     Lab Results  Component Value Date   TSH 3.390 02/03/2020   Lab Results  Component Value Date   WBC 7.8 09/16/2020   HGB 14.3 09/16/2020   HCT 43.5 09/16/2020    MCV 95.0 09/16/2020   PLT 255 09/16/2020   Lab Results  Component Value Date   NA 140 09/16/2020   K 4.0 09/16/2020   CO2 25 09/16/2020   GLUCOSE 93 09/16/2020   BUN 10 09/16/2020   CREATININE 0.96 09/16/2020   BILITOT <0.2 08/11/2020   ALKPHOS 152 (H) 08/11/2020   AST 7 08/11/2020   ALT 6 08/11/2020   PROT 6.7 08/11/2020   ALBUMIN 4.4 08/11/2020   CALCIUM 8.7 (L) 09/16/2020   ANIONGAP 9 09/16/2020   Lab Results  Component Value Date   CHOL 160 08/11/2020   Lab Results  Component Value Date   HDL 33 (L) 08/11/2020   Lab Results  Component Value Date   LDLCALC 103 (H) 08/11/2020   Lab Results  Component Value Date   TRIG 130 08/11/2020   Lab Results  Component Value Date   CHOLHDL 4.8 08/11/2020   Lab Results  Component Value Date   HGBA1C 4.9 12/10/2018

## 2020-12-04 ENCOUNTER — Encounter: Payer: Self-pay | Admitting: Family Medicine

## 2020-12-04 DIAGNOSIS — L281 Prurigo nodularis: Secondary | ICD-10-CM | POA: Insufficient documentation

## 2020-12-04 LAB — CBC WITH DIFFERENTIAL/PLATELET
Basophils Absolute: 0 10*3/uL (ref 0.0–0.2)
Basos: 1 %
EOS (ABSOLUTE): 0.1 10*3/uL (ref 0.0–0.4)
Eos: 1 %
Hematocrit: 44.1 % (ref 37.5–51.0)
Hemoglobin: 14.7 g/dL (ref 13.0–17.7)
Immature Grans (Abs): 0 10*3/uL (ref 0.0–0.1)
Immature Granulocytes: 0 %
Lymphocytes Absolute: 3.2 10*3/uL — ABNORMAL HIGH (ref 0.7–3.1)
Lymphs: 40 %
MCH: 31.3 pg (ref 26.6–33.0)
MCHC: 33.3 g/dL (ref 31.5–35.7)
MCV: 94 fL (ref 79–97)
Monocytes Absolute: 0.8 10*3/uL (ref 0.1–0.9)
Monocytes: 9 %
Neutrophils Absolute: 4 10*3/uL (ref 1.4–7.0)
Neutrophils: 49 %
Platelets: 237 10*3/uL (ref 150–450)
RBC: 4.69 x10E6/uL (ref 4.14–5.80)
RDW: 13.7 % (ref 11.6–15.4)
WBC: 8.1 10*3/uL (ref 3.4–10.8)

## 2020-12-04 LAB — CMP14+EGFR
ALT: 8 IU/L (ref 0–44)
AST: 10 IU/L (ref 0–40)
Albumin/Globulin Ratio: 2.1 (ref 1.2–2.2)
Albumin: 4.5 g/dL (ref 3.8–4.9)
Alkaline Phosphatase: 167 IU/L — ABNORMAL HIGH (ref 44–121)
BUN/Creatinine Ratio: 12 (ref 10–24)
BUN: 12 mg/dL (ref 8–27)
Bilirubin Total: <0.2 mg/dL (ref 0.0–1.2)
CO2: 26 mmol/L (ref 20–29)
Calcium: 9.6 mg/dL (ref 8.6–10.2)
Chloride: 100 mmol/L (ref 96–106)
Creatinine, Ser: 0.99 mg/dL (ref 0.76–1.27)
Globulin, Total: 2.1 g/dL (ref 1.5–4.5)
Glucose: 90 mg/dL (ref 65–99)
Potassium: 4.8 mmol/L (ref 3.5–5.2)
Sodium: 137 mmol/L (ref 134–144)
Total Protein: 6.6 g/dL (ref 6.0–8.5)
eGFR: 87 mL/min/{1.73_m2} (ref >59)

## 2020-12-04 LAB — LIPID PANEL
Chol/HDL Ratio: 4.2 ratio (ref 0.0–5.0)
Cholesterol, Total: 167 mg/dL (ref 100–199)
HDL: 40 mg/dL (ref >39)
LDL Chol Calc (NIH): 105 mg/dL — ABNORMAL HIGH (ref 0–99)
Triglycerides: 121 mg/dL (ref 0–149)
VLDL Cholesterol Cal: 22 mg/dL (ref 5–40)

## 2020-12-04 LAB — TSH: TSH: 0.254 u[IU]/mL — ABNORMAL LOW (ref 0.450–4.500)

## 2020-12-04 LAB — VITAMIN B12: Vitamin B-12: 188 pg/mL — ABNORMAL LOW (ref 232–1245)

## 2020-12-04 LAB — T4, FREE: Free T4: 1.5 ng/dL (ref 0.82–1.77)

## 2020-12-05 ENCOUNTER — Other Ambulatory Visit: Payer: Self-pay | Admitting: Family Medicine

## 2020-12-05 DIAGNOSIS — E782 Mixed hyperlipidemia: Secondary | ICD-10-CM

## 2020-12-05 DIAGNOSIS — E039 Hypothyroidism, unspecified: Secondary | ICD-10-CM

## 2020-12-05 MED ORDER — ATORVASTATIN CALCIUM 20 MG PO TABS: 20.0000 mg | ORAL_TABLET | Freq: Every evening | ORAL | 2 refills | Status: DC

## 2020-12-13 ENCOUNTER — Other Ambulatory Visit: Payer: Self-pay

## 2020-12-13 DIAGNOSIS — E039 Hypothyroidism, unspecified: Secondary | ICD-10-CM

## 2020-12-26 ENCOUNTER — Other Ambulatory Visit: Payer: Self-pay | Admitting: Family Medicine

## 2020-12-26 DIAGNOSIS — M542 Cervicalgia: Secondary | ICD-10-CM

## 2020-12-31 ENCOUNTER — Other Ambulatory Visit: Payer: Medicaid Other

## 2020-12-31 DIAGNOSIS — E039 Hypothyroidism, unspecified: Secondary | ICD-10-CM

## 2021-01-01 ENCOUNTER — Other Ambulatory Visit: Payer: Self-pay | Admitting: Family Medicine

## 2021-01-01 LAB — TSH: TSH: 0.313 u[IU]/mL — ABNORMAL LOW (ref 0.450–4.500)

## 2021-01-01 LAB — T4, FREE: Free T4: 1.66 ng/dL (ref 0.82–1.77)

## 2021-01-01 MED ORDER — LEVOTHYROXINE SODIUM 150 MCG PO TABS: 150.0000 ug | ORAL_TABLET | Freq: Every day | ORAL | 2 refills | Status: DC

## 2021-02-08 ENCOUNTER — Ambulatory Visit: Payer: Medicaid Other | Admitting: Family Medicine

## 2021-02-10 ENCOUNTER — Encounter: Payer: Self-pay | Admitting: Family Medicine

## 2021-02-14 ENCOUNTER — Other Ambulatory Visit: Payer: Self-pay | Admitting: *Deleted

## 2021-02-14 DIAGNOSIS — F411 Generalized anxiety disorder: Secondary | ICD-10-CM

## 2021-02-18 ENCOUNTER — Ambulatory Visit: Payer: Medicaid Other | Admitting: Family Medicine

## 2021-02-18 ENCOUNTER — Other Ambulatory Visit: Payer: Self-pay

## 2021-02-18 ENCOUNTER — Encounter: Payer: Self-pay | Admitting: Family Medicine

## 2021-02-18 VITALS — BP 122/76 | HR 72 | Temp 98.2°F | Ht 68.0 in | Wt 162.2 lb

## 2021-02-18 DIAGNOSIS — E782 Mixed hyperlipidemia: Secondary | ICD-10-CM

## 2021-02-18 DIAGNOSIS — E039 Hypothyroidism, unspecified: Secondary | ICD-10-CM | POA: Diagnosis not present

## 2021-02-18 DIAGNOSIS — E538 Deficiency of other specified B group vitamins: Secondary | ICD-10-CM | POA: Diagnosis not present

## 2021-02-18 DIAGNOSIS — I10 Essential (primary) hypertension: Secondary | ICD-10-CM | POA: Diagnosis not present

## 2021-02-18 MED ORDER — ATORVASTATIN CALCIUM 20 MG PO TABS: 20.0000 mg | ORAL_TABLET | Freq: Every evening | ORAL | 1 refills | Status: DC

## 2021-02-18 NOTE — Progress Notes (Signed)
Assessment & Plan:  1. Acquired hypothyroidism I called his pharmacy to verify he picked up levothyroxine 150 mcg, which he did on 01/10/2021. - TSH - T4, free  2. Vitamin B12 deficiency Labs to assess. Encouraged to pick up another OTC supplement of B12.  - Vitamin B12  3. Primary hypertension Well controlled on current regimen.  - CMP14+EGFR - Lipid panel  4. Mixed hyperlipidemia Labs to assess. - atorvastatin (LIPITOR) 20 MG tablet; Take 1 tablet (20 mg total) by mouth every evening.  Dispense: 90 tablet; Refill: 1 - CMP14+EGFR - Lipid panel   Return as directed after labs result.  [name], MSN, APRN, FNP-C [facility]  Subjective:    Patient ID: [name], male    DOB: [birth year], 60 y.o.   MRN: [id number]  Patient Care Team: Loman Brooklyn, FNP as PCP - General (Family Medicine) Aletha Halim PA-C [facility])   Chief Complaint:  Chief Complaint  Patient presents with   Hypertension   Hyperlipidemia    6 month follow up of chronic medical conditions     HPI: [name] is a 60 y.o. male presenting on 02/18/2021 for Hypertension and Hyperlipidemia (6 month follow up of chronic medical conditions )  Patient reports his psychiatrist moved back to [place]. He has an appointment to get established at [facility] on Monday.   Hypothyroidism: patient is not sure if he is taking his previous dose of levothyroxine at 175 mcg or the new dose of 150 mcg.   B12 deficiency: patient reports he has been taking a B12 supplement but ran out last week.   New complaints: None   Social history:  Relevant past medical, surgical, family and social history reviewed and updated as indicated. Interim medical history since our last visit reviewed.  Allergies and medications reviewed and updated.  DATA REVIEWED: CHART IN EPIC  ROS: Negative unless specifically indicated above in HPI.    Current Outpatient Medications:     amLODipine (NORVASC) 5 MG tablet, Take 1 tablet (5 mg total) by mouth daily., Disp: 90 tablet, Rfl: 1   carbamazepine (EQUETRO) 200 MG CP12 12 hr capsule, Take 1 capsule (200 mg total) by mouth 2 (two) times daily., Disp: 60 capsule, Rfl: 5   levothyroxine (SYNTHROID) 150 MCG tablet, Take 1 tablet (150 mcg total) by mouth daily before breakfast., Disp: 30 tablet, Rfl: 2   QUEtiapine (SEROQUEL XR) 200 MG 24 hr tablet, Take 1 tablet (200 mg total) by mouth at bedtime., Disp: 30 tablet, Rfl: 5   atorvastatin (LIPITOR) 20 MG tablet, Take 1 tablet (20 mg total) by mouth every evening., Disp: 90 tablet, Rfl: 1   sertraline (ZOLOFT) 100 MG tablet, Take 1 tablet (100 mg total) by mouth daily., Disp: 90 tablet, Rfl: 1 No current facility-administered medications for this visit.  Facility-Administered Medications Ordered in Other Visits:    gadopentetate dimeglumine (MAGNEVIST) injection 15 mL, 15 mL, Intravenous, Once PRN, Sater, Nanine Means, MD   Allergies  Allergen Reactions   Benadryl [Diphenhydramine] Other (See Comments)    "throws me for a loop". Seizure   Past Medical History:  Diagnosis Date   Anxiety and depression    Arthritis    Headache    Hemorrhoids    Hyperlipidemia 02/06/2020   Hypertension    Hypothyroidism    OSA on CPAP    doesn't use often pt states    Pneumonia    Seizures ([place]) 02/2016   only with  Benadryl use   Suicide and self-inflicted injury ([place])    Vitamin B12 deficiency 02/06/2020    Past Surgical History:  Procedure Laterality Date   ANTERIOR CERVICAL DECOMP/DISCECTOMY FUSION N/A 09/20/2020   Procedure: ANTERIOR CERVICAL DECOMPRESSION/DISCECTOMY FUSION CERVICAL THREE- CERVICAL FOUR;  Surgeon: Vallarie Mare, MD;  Location: [facility];  Service: Neurosurgery;  Laterality: N/A;   BICEPS TENDON REPAIR Right    CARPAL TUNNEL RELEASE Bilateral    DECORTICATION Left 11/12/2018   Procedure: DECORTICATION;  Surgeon: Prescott Gum, Collier Salina, MD;  Location: [place];  Service:  Thoracic;  Laterality: Left;   KNEE CARTILAGE SURGERY Left    KNEE SURGERY Right    x 2   LAPAROTOMY N/A 11/03/2018   Procedure: EXPLORATORY LAPAROTOMY;  Surgeon: Erroll Luna, MD;  Location: [facility];  Service: General;  Laterality: N/A;   VIDEO ASSISTED THORACOSCOPY (VATS)/EMPYEMA Left 11/12/2018   Procedure: VIDEO ASSISTED THORACOSCOPY (VATS)/EMPYEMA, MINI THORACOTOMY;  Surgeon: Ivin Poot, MD;  Location: [facility];  Service: Thoracic;  Laterality: Left;  needs cell saver    Social History   Socioeconomic History   Marital status: Married    Spouse name: [name]   Number of children: 1   Years of education: 12   Highest education level: 12th grade  Occupational History   Occupation: Architect    Comment: remodels houses   Occupation: Clinical research associate     Comment: quit last week   Tobacco Use   Smoking status: Every Day    Packs/day: 1.50    Years: 51.00    Pack years: 76.50    Types: Cigarettes    Start date: 1969   Smokeless tobacco: Never  Vaping Use   Vaping Use: Never used  Substance and Sexual Activity   Alcohol use: Not Currently    Comment: none since March 2020   Drug use: Not Currently    Frequency: 3.0 times per week    Types: Marijuana    Comment: 02/03/20- states no    Sexual activity: Not Currently    Partners: Female    Birth control/protection: None  Other Topics Concern   Not on file  Social History Narrative   ** Merged History Encounter **       Lives with wife Caffeine- coffee 2 cups daily   Social Determinants of Health   Financial Resource Strain: Not on file  Food Insecurity: Not on file  Transportation Needs: Not on file  Physical Activity: Not on file  Stress: Not on file  Social Connections: Not on file  Intimate Partner Violence: Not on file        Objective:    BP 122/76   Pulse 72   Temp 98.2 F (36.8 C) (Temporal)   Ht 5' 8"  (1.727 m)   Wt 162 lb 3.2 oz (73.6 kg)   SpO2 96%   BMI 24.66 kg/m   Wt Readings  from Last 3 Encounters:  02/18/21 162 lb 3.2 oz (73.6 kg)  12/03/20 162 lb 3.2 oz (73.6 kg)  09/20/20 169 lb (76.7 kg)    Physical Exam Vitals reviewed.  Constitutional:      General: He is not in acute distress.    Appearance: Normal appearance. He is normal weight. He is not ill-appearing, toxic-appearing or diaphoretic.  HENT:     Head: Normocephalic and atraumatic.  Eyes:     General: No scleral icterus.       Right eye: No discharge.        Left  eye: No discharge.     Conjunctiva/sclera: Conjunctivae normal.  Cardiovascular:     Rate and Rhythm: Normal rate.  Pulmonary:     Effort: Pulmonary effort is normal. No respiratory distress.  Musculoskeletal:        General: Normal range of motion.     Cervical back: Normal range of motion.  Skin:    General: Skin is warm and dry.  Neurological:     Mental Status: He is alert and oriented to person, place, and time. Mental status is at baseline.  Psychiatric:        Mood and Affect: Mood normal.        Behavior: Behavior normal.        Thought Content: Thought content normal.        Judgment: Judgment normal.    Lab Results  Component Value Date   TSH 0.313 (L) 12/31/2020   Lab Results  Component Value Date   WBC 8.1 12/03/2020   HGB 14.7 12/03/2020   HCT 44.1 12/03/2020   MCV 94 12/03/2020   PLT 237 12/03/2020   Lab Results  Component Value Date   NA 137 12/03/2020   K 4.8 12/03/2020   CO2 26 12/03/2020   GLUCOSE 90 12/03/2020   BUN 12 12/03/2020   CREATININE 0.99 12/03/2020   BILITOT <0.2 12/03/2020   ALKPHOS 167 (H) 12/03/2020   AST 10 12/03/2020   ALT 8 12/03/2020   PROT 6.6 12/03/2020   ALBUMIN 4.5 12/03/2020   CALCIUM 9.6 12/03/2020   ANIONGAP 9 09/16/2020   EGFR 87 12/03/2020   Lab Results  Component Value Date   CHOL 167 12/03/2020   Lab Results  Component Value Date   HDL 40 12/03/2020   Lab Results  Component Value Date   LDLCALC 105 (H) 12/03/2020   Lab Results  Component Value  Date   TRIG 121 12/03/2020   Lab Results  Component Value Date   CHOLHDL 4.2 12/03/2020   Lab Results  Component Value Date   HGBA1C 4.9 12/10/2018

## 2021-02-19 LAB — TSH: TSH: 0.923 u[IU]/mL (ref 0.450–4.500)

## 2021-02-19 LAB — CMP14+EGFR
ALT: 7 IU/L (ref 0–44)
AST: 9 IU/L (ref 0–40)
Albumin/Globulin Ratio: 2.3 — ABNORMAL HIGH (ref 1.2–2.2)
Albumin: 4.5 g/dL (ref 3.8–4.8)
Alkaline Phosphatase: 143 IU/L — ABNORMAL HIGH (ref 44–121)
BUN/Creatinine Ratio: 17 (ref 10–24)
BUN: 17 mg/dL (ref 8–27)
Bilirubin Total: <0.2 mg/dL (ref 0.0–1.2)
CO2: 21 mmol/L (ref 20–29)
Calcium: 9.5 mg/dL (ref 8.6–10.2)
Chloride: 101 mmol/L (ref 96–106)
Creatinine, Ser: 0.98 mg/dL (ref 0.76–1.27)
Globulin, Total: 2 g/dL (ref 1.5–4.5)
Glucose: 83 mg/dL (ref 65–99)
Potassium: 4.8 mmol/L (ref 3.5–5.2)
Sodium: 137 mmol/L (ref 134–144)
Total Protein: 6.5 g/dL (ref 6.0–8.5)
eGFR: 88 mL/min/{1.73_m2} (ref >59)

## 2021-02-19 LAB — VITAMIN B12: Vitamin B-12: 262 pg/mL (ref 232–1245)

## 2021-02-19 LAB — LIPID PANEL
Chol/HDL Ratio: 4.2 ratio (ref 0.0–5.0)
Cholesterol, Total: 157 mg/dL (ref 100–199)
HDL: 37 mg/dL — ABNORMAL LOW (ref >39)
LDL Chol Calc (NIH): 102 mg/dL — ABNORMAL HIGH (ref 0–99)
Triglycerides: 99 mg/dL (ref 0–149)
VLDL Cholesterol Cal: 18 mg/dL (ref 5–40)

## 2021-02-19 LAB — T4, FREE: Free T4: 1.43 ng/dL (ref 0.82–1.77)

## 2021-03-20 ENCOUNTER — Other Ambulatory Visit: Payer: Self-pay | Admitting: Family

## 2021-04-18 ENCOUNTER — Telehealth: Payer: Self-pay | Admitting: Family Medicine

## 2021-04-18 MED ORDER — LEVOTHYROXINE SODIUM 150 MCG PO TABS: 150.0000 ug | ORAL_TABLET | Freq: Every day | ORAL | 2 refills | Status: DC

## 2021-04-18 NOTE — Telephone Encounter (Signed)
Patient aware that rx sent to pharmacy. 

## 2021-04-18 NOTE — Telephone Encounter (Signed)
  Prescription Request  04/18/2021  Is this a "Controlled Substance" medicine? no Have you seen your PCP in the last 2 weeks? no If YES, route message to pool  -  If NO, patient needs to be scheduled for appointment.  What is the name of the medication or equipment? Levothyroxine  Have you contacted your pharmacy to request a refill? yes  Which pharmacy would you like this sent to? [facility] in [place]   Patient notified that their request is being sent to the clinical staff for review and that they should receive a response within 2 business days.    Joyce's pt.  Please call pt.

## 2021-04-18 NOTE — Addendum Note (Signed)
Addended by: Thana Ates on: 04/18/2021 12:01 PM   Modules accepted: Orders

## 2021-05-20 DIAGNOSIS — M4712 Other spondylosis with myelopathy, cervical region: Secondary | ICD-10-CM | POA: Diagnosis not present

## 2021-05-20 DIAGNOSIS — I10 Essential (primary) hypertension: Secondary | ICD-10-CM | POA: Diagnosis not present

## 2021-05-23 ENCOUNTER — Other Ambulatory Visit: Payer: Self-pay | Admitting: Neurosurgery

## 2021-05-23 DIAGNOSIS — M4712 Other spondylosis with myelopathy, cervical region: Secondary | ICD-10-CM

## 2021-06-15 ENCOUNTER — Other Ambulatory Visit: Payer: Self-pay

## 2021-06-15 ENCOUNTER — Ambulatory Visit
Admission: RE | Admit: 2021-06-15 | Discharge: 2021-06-15 | Disposition: A | Discharge status: Home / Self Care | Payer: Medicaid Other | Source: Ambulatory Visit | Attending: Neurosurgery | Admitting: Neurosurgery

## 2021-06-15 DIAGNOSIS — M4712 Other spondylosis with myelopathy, cervical region: Secondary | ICD-10-CM

## 2021-06-15 DIAGNOSIS — M4802 Spinal stenosis, cervical region: Secondary | ICD-10-CM | POA: Diagnosis not present

## 2021-06-15 IMAGING — MR MR CERVICAL SPINE W/O CM
4 of 5 series · 27 of 48 positions shown · non-contrast
Comparison: MRI of the cervical spine [DATE].

CLINICAL DATA: Other spondylosis with myelopathy, cervical region.

EXAM:
MRI CERVICAL SPINE WITHOUT CONTRAST
TECHNIQUE: Multiplanar, multisequence MR imaging of the cervical spine was
performed. No intravenous contrast was administered.

[Series 2: T2 · sagittal · 3.0mm · 0.66mm/px · 6 of 15 slices shown (1 of 2)]
[im 1/15]
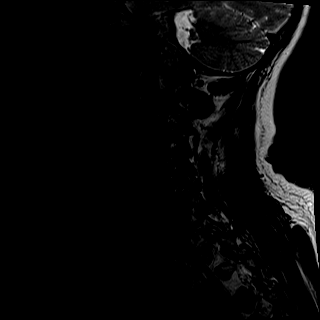
[im 3/15]
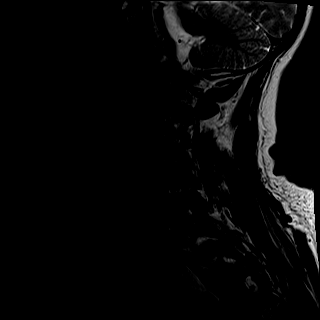
[im 6/15]
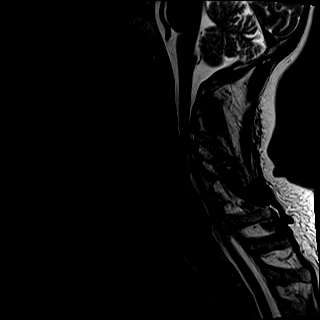
[im 9/15]
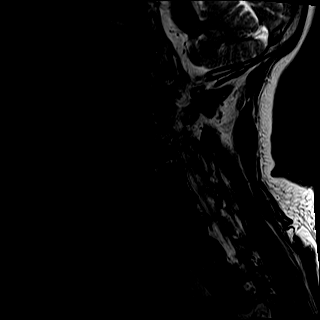
[im 12/15]
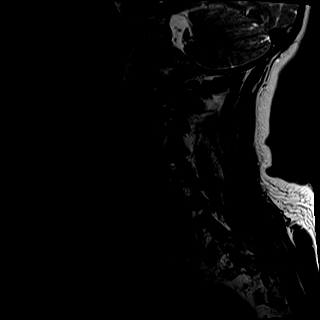
[im 15/15]
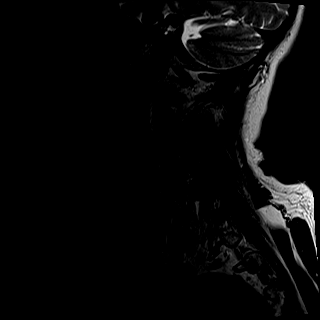

[Series 3: STIR · sagittal · 3.0mm · 0.41mm/px · 6 of 15 slices shown]
[im 1/15]
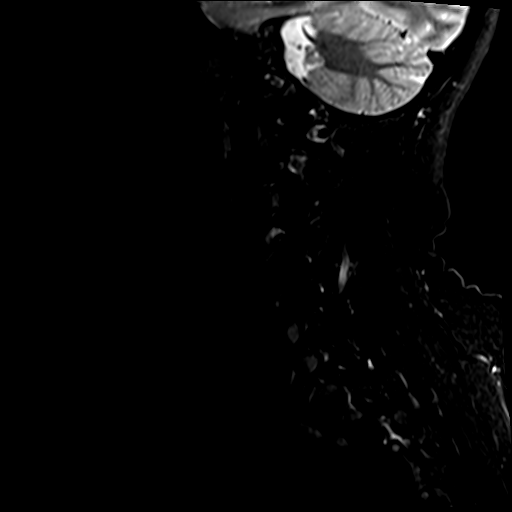
[im 3/15]
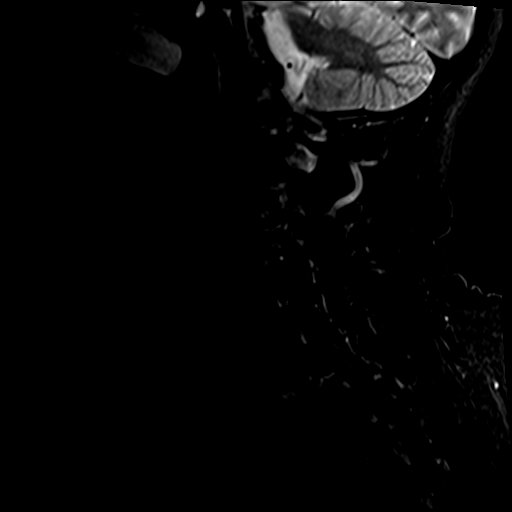
[im 5/15]
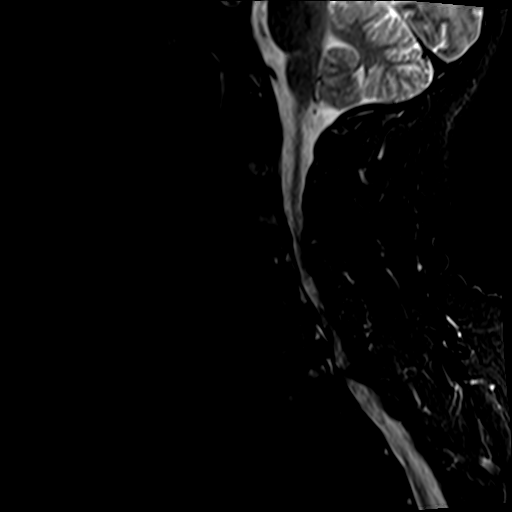
[im 8/15]
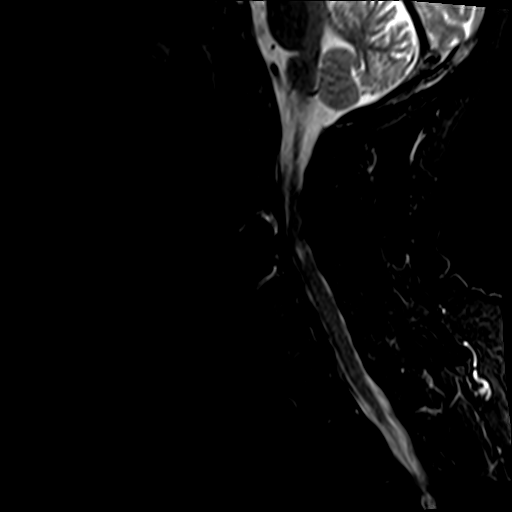
[im 10/15]
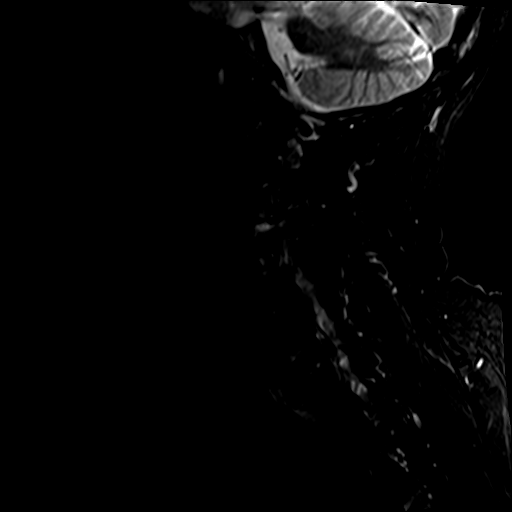
[im 12/15]
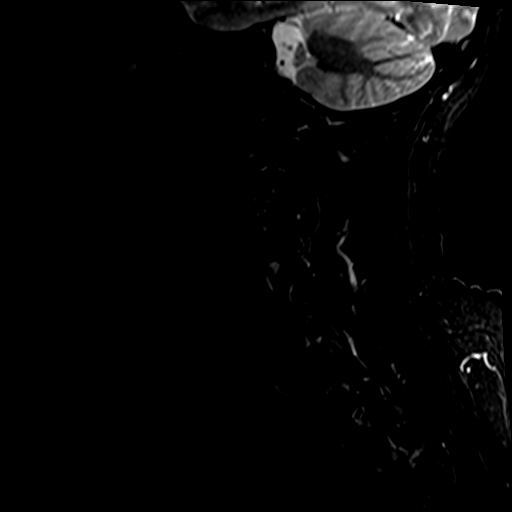

[Series 4: T1 · sagittal · 3.0mm · 0.41mm/px · 7 of 15 slices shown]
[im 1/15]
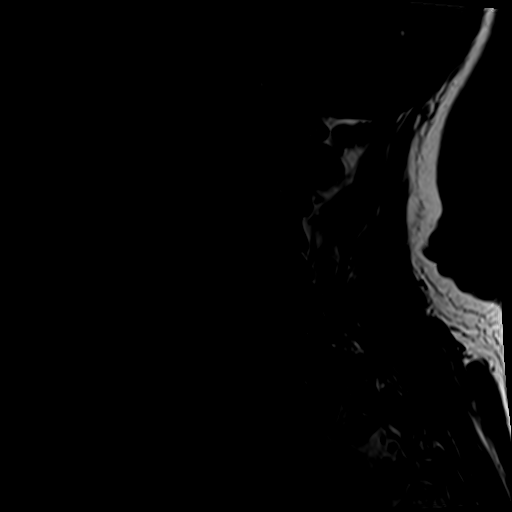
[im 3/15]
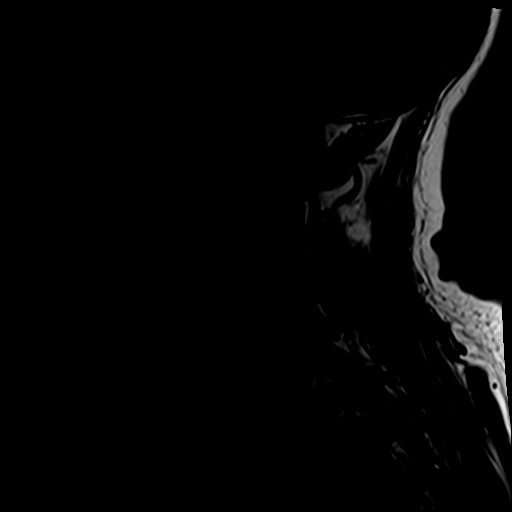
[im 5/15]
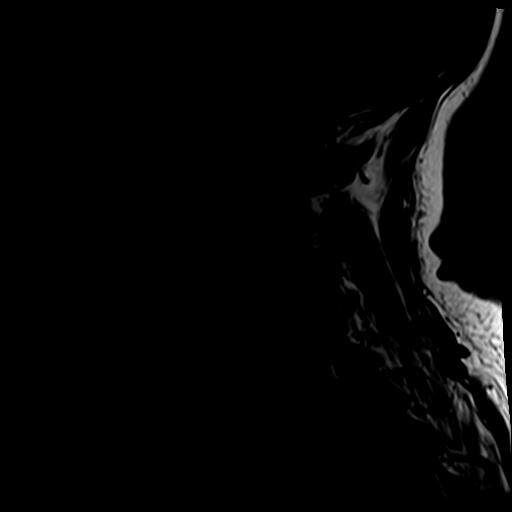
[im 8/15]
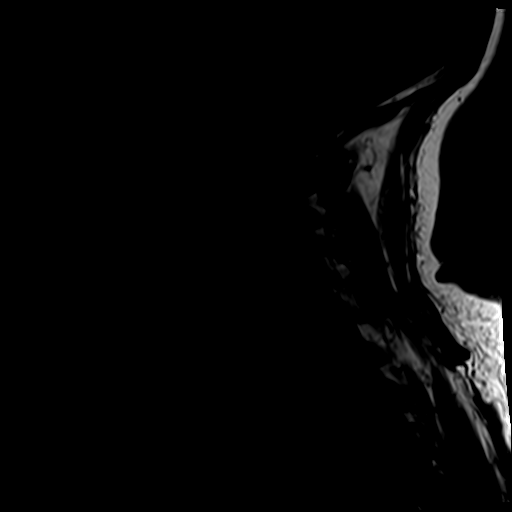
[im 10/15]
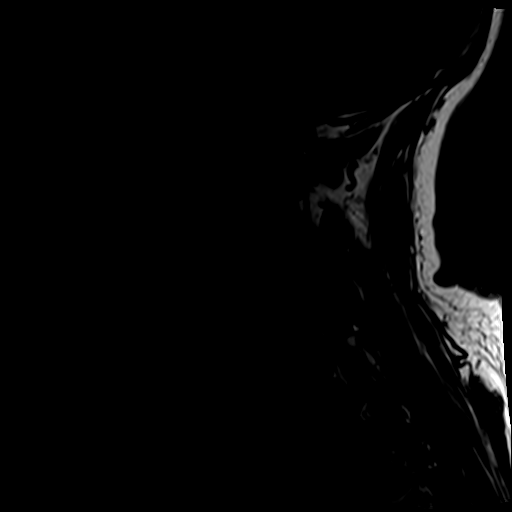
[im 12/15]
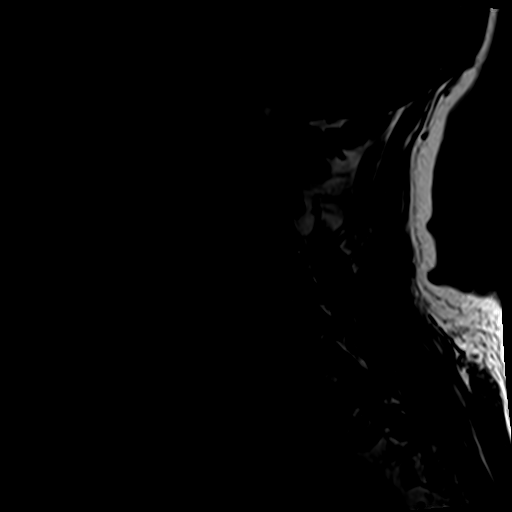
[im 15/15]
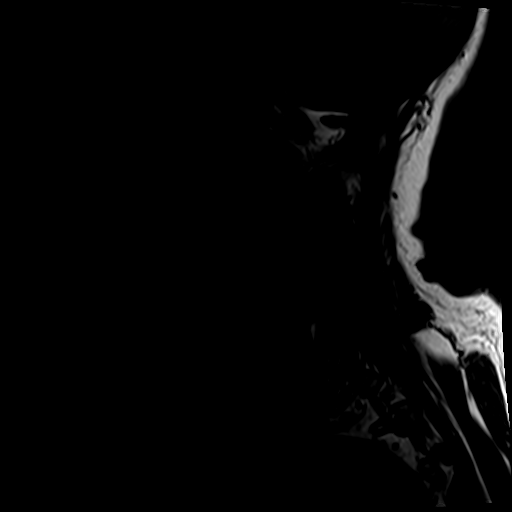

[Series 6: T2 · axial · 3.0mm · 0.70mm/px · z∈[-41,+61]mm · 8 of 30 slices shown (2 of 2)]
[im 1/30]
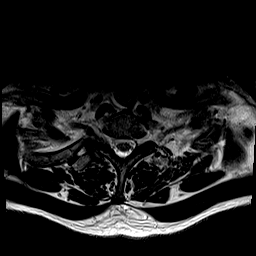
[im 5/30]
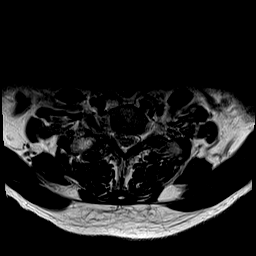
[im 9/30]
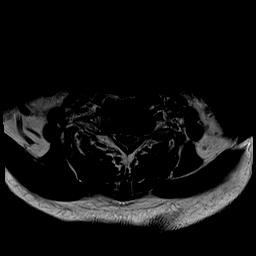
[im 14/30]
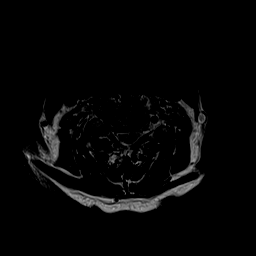
[im 16/30]
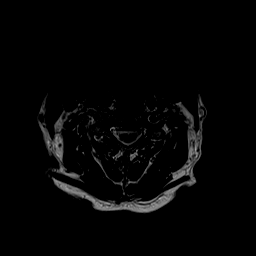
[im 21/30]
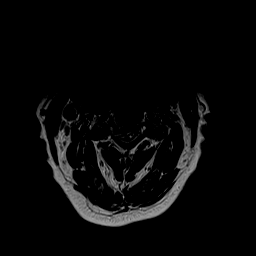
[im 25/30]
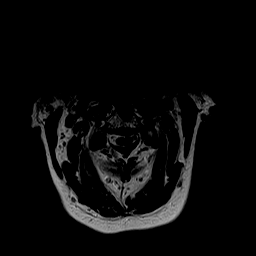
[im 30/30]
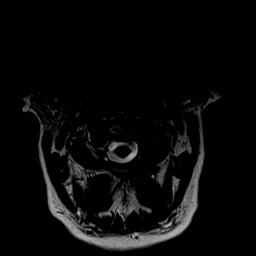

[27 of 48 positions shown; findings below may reference images not displayed]

FINDINGS: Alignment: Physiologic.

Vertebrae: No fracture, evidence of discitis, or bone lesion.
Postsurgical changes from C3-4 ACDF.

Cord: Increased T2 signal within the cord centrally at the C4 level
consistent with myelomalacia.

Posterior Fossa, vertebral arteries, paraspinal tissues: Negative.

Disc levels:

C2-3: Left posterolateral disc protrusion causing indentation of the
thecal sac and resulting in mild spinal canal stenosis. Facet
degenerative changes without significant neural foraminal narrowing.

C3-4: Posterior disc osteophyte complex resulting in moderate spinal
canal stenosis with mass effect on the cord. Uncovertebral and facet
degenerative changes resulting in moderate bilateral neural
foraminal narrowing, improved from prior MRI.

C4-5: Posterior disc osteophyte complex resulting in mild spinal
canal stenosis. Uncovertebral and facet degenerative changes
resulting in moderate right and mild left neural foraminal
narrowing. No significant change from prior.

C5-6: Posterior disc osteophyte complex resulting in
mild-to-moderate spinal canal stenosis. Uncovertebral and facet
degenerative changes resulting in severe right and mild left
foraminal narrowing.

C6-7: Posterior disc osteophyte complex resulting mild spinal canal
stenosis. Uncovertebral and facet degenerative changes resulting in
severe bilateral neural foraminal narrowing.

C7-T1: Facet degenerative changes resulting in mild-to-moderate
right neural foraminal narrowing. No spinal canal stenosis.
IMPRESSION: 1. Interval ACDF at C3-4 with improvement of the degree of neural
foraminal stenosis, now moderate bilaterally. Persistent moderate
spinal canal stenosis at this level.
2. Otherwise, stable degenerative changes with severe bilateral
neural foraminal narrowing at C6-7 and on the right at C5-6.

## 2021-06-22 ENCOUNTER — Other Ambulatory Visit: Payer: Self-pay | Admitting: Family Medicine

## 2021-06-22 DIAGNOSIS — I10 Essential (primary) hypertension: Secondary | ICD-10-CM

## 2021-06-29 ENCOUNTER — Encounter: Payer: Self-pay | Admitting: Family Medicine

## 2021-06-29 ENCOUNTER — Other Ambulatory Visit: Payer: Self-pay

## 2021-06-29 ENCOUNTER — Ambulatory Visit (INDEPENDENT_AMBULATORY_CARE_PROVIDER_SITE_OTHER): Payer: Medicare Other | Admitting: Family Medicine

## 2021-06-29 VITALS — BP 121/74 | HR 60 | Temp 97.8°F | Ht 68.0 in | Wt 152.6 lb

## 2021-06-29 DIAGNOSIS — M542 Cervicalgia: Secondary | ICD-10-CM

## 2021-06-29 DIAGNOSIS — G8929 Other chronic pain: Secondary | ICD-10-CM

## 2021-06-29 DIAGNOSIS — F3170 Bipolar disorder, currently in remission, most recent episode unspecified: Secondary | ICD-10-CM

## 2021-06-29 DIAGNOSIS — G4733 Obstructive sleep apnea (adult) (pediatric): Secondary | ICD-10-CM | POA: Diagnosis not present

## 2021-06-29 NOTE — Progress Notes (Signed)
Assessment & Plan:  1. OSA (obstructive sleep apnea) Patient is going to purchase a new mask on [employer]. I did advise if he needs a prescription, I am happy to provide it.  2. Bipolar affective disorder in remission [facility]) Managed by Dr. Hoyle Barr.  3. Chronic neck pain Managed by Dr. Marcello Moores.   Follow up plan: Return in about 3 months (around 09/29/2021) for annual physical.  [name], MSN, APRN, FNP-C [name]  Subjective:   Patient ID: [name], male    DOB: 08-26-1960, 60 y.o.   MRN: [id number]  HPI: [name] is a 60 y.o. male presenting on 06/29/2021 for cpap supplies   Patient is in need of a new full face CPAP mask. He states his started leaking, so he quit using it and he needs to replace it. His last sleep study was 9-10 years ago per his report. He self bought his CPAP machine at that time from [employer] in [place].   He also wanted to update his medication list with [place] since his psychiatrist, Dr. Hoyle Barr, made changes to his medication regimen. He is not fond of Dr. Hoyle Barr since he changed his medications around and now he is not sleeping as well. He was previously seeing Dr. Montel Culver, who retired earlier this year.  Patient is seeing Dr. Marcello Moores, neurosurgeon, due to neck pain. Patient reports he may need a repeat neck surgery. He has an appointment on 07/13/2021 to discuss his recent MRI he had completed a month ago.    ROS: Negative unless specifically indicated above in HPI.   Relevant past medical history reviewed and updated as indicated.   Allergies and medications reviewed and updated.   Current Outpatient Medications:    amLODipine (NORVASC) 5 MG tablet, TAKE 1 TABLET(5 MG) BY MOUTH DAILY, Disp: 90 tablet, Rfl: 0   atorvastatin (LIPITOR) 20 MG tablet, Take 1 tablet (20 mg total) by mouth every evening., Disp: 90 tablet, Rfl: 1   levothyroxine (SYNTHROID) 150 MCG tablet, Take 1 tablet (150 mcg total) by mouth daily before  breakfast., Disp: 30 tablet, Rfl: 2   QUEtiapine (SEROQUEL XR) 200 MG 24 hr tablet, Take 1 tablet (200 mg total) by mouth at bedtime. (Patient taking differently: Take 100 mg by mouth at bedtime.), Disp: 30 tablet, Rfl: 5 No current facility-administered medications for this visit.  Facility-Administered Medications Ordered in Other Visits:    gadopentetate dimeglumine (MAGNEVIST) injection 15 mL, 15 mL, Intravenous, Once PRN, Sater, Nanine Means, MD  Allergies  Allergen Reactions   Benadryl [Diphenhydramine] Other (See Comments)    "throws me for a loop". Seizure    Objective:   BP 121/74   Pulse 60   Temp 97.8 F (36.6 C) (Temporal)   Ht 5\' 8"  (1.727 m)   Wt 152 lb 9.6 oz (69.2 kg)   SpO2 96%   BMI 23.20 kg/m    Physical Exam Vitals reviewed.  Constitutional:      General: He is not in acute distress.    Appearance: Normal appearance. He is not ill-appearing, toxic-appearing or diaphoretic.  HENT:     Head: Normocephalic and atraumatic.  Eyes:     General: No scleral icterus.       Right eye: No discharge.        Left eye: No discharge.     Conjunctiva/sclera: Conjunctivae normal.  Cardiovascular:     Rate and Rhythm: Normal rate.  Pulmonary:     Effort: Pulmonary effort is normal.  No respiratory distress.  Musculoskeletal:        General: Normal range of motion.     Cervical back: Normal range of motion.  Skin:    General: Skin is warm and dry.  Neurological:     Mental Status: He is alert and oriented to person, place, and time. Mental status is at baseline.  Psychiatric:        Mood and Affect: Mood normal.        Behavior: Behavior normal.        Thought Content: Thought content normal.        Judgment: Judgment normal.

## 2021-07-18 ENCOUNTER — Other Ambulatory Visit: Payer: Self-pay | Admitting: Family Medicine

## 2021-07-22 DIAGNOSIS — M4712 Other spondylosis with myelopathy, cervical region: Secondary | ICD-10-CM | POA: Diagnosis not present

## 2021-07-28 ENCOUNTER — Encounter: Payer: Self-pay | Admitting: Gastroenterology

## 2021-08-23 ENCOUNTER — Other Ambulatory Visit: Payer: Self-pay | Admitting: Family Medicine

## 2021-08-23 DIAGNOSIS — E782 Mixed hyperlipidemia: Secondary | ICD-10-CM

## 2021-09-29 ENCOUNTER — Encounter: Payer: Self-pay | Admitting: Family Medicine

## 2021-09-29 ENCOUNTER — Ambulatory Visit (INDEPENDENT_AMBULATORY_CARE_PROVIDER_SITE_OTHER): Payer: Medicare Other | Admitting: Family Medicine

## 2021-09-29 VITALS — BP 121/74 | HR 66 | Temp 97.6°F | Ht 68.0 in | Wt 144.2 lb

## 2021-09-29 DIAGNOSIS — E039 Hypothyroidism, unspecified: Secondary | ICD-10-CM | POA: Diagnosis not present

## 2021-09-29 DIAGNOSIS — R2689 Other abnormalities of gait and mobility: Secondary | ICD-10-CM

## 2021-09-29 DIAGNOSIS — M549 Dorsalgia, unspecified: Secondary | ICD-10-CM | POA: Diagnosis not present

## 2021-09-29 DIAGNOSIS — E538 Deficiency of other specified B group vitamins: Secondary | ICD-10-CM | POA: Diagnosis not present

## 2021-09-29 DIAGNOSIS — R296 Repeated falls: Secondary | ICD-10-CM

## 2021-09-29 DIAGNOSIS — F411 Generalized anxiety disorder: Secondary | ICD-10-CM

## 2021-09-29 DIAGNOSIS — Z0001 Encounter for general adult medical examination with abnormal findings: Secondary | ICD-10-CM

## 2021-09-29 DIAGNOSIS — F332 Major depressive disorder, recurrent severe without psychotic features: Secondary | ICD-10-CM

## 2021-09-29 DIAGNOSIS — I10 Essential (primary) hypertension: Secondary | ICD-10-CM | POA: Diagnosis not present

## 2021-09-29 DIAGNOSIS — E782 Mixed hyperlipidemia: Secondary | ICD-10-CM | POA: Diagnosis not present

## 2021-09-29 DIAGNOSIS — F3170 Bipolar disorder, currently in remission, most recent episode unspecified: Secondary | ICD-10-CM

## 2021-09-29 DIAGNOSIS — Z0000 Encounter for general adult medical examination without abnormal findings: Secondary | ICD-10-CM | POA: Diagnosis not present

## 2021-09-29 MED ORDER — ATORVASTATIN CALCIUM 20 MG PO TABS: 20.0000 mg | ORAL_TABLET | Freq: Every day | ORAL | 1 refills | Status: DC

## 2021-09-29 MED ORDER — LEVOTHYROXINE SODIUM 150 MCG PO TABS: 150.0000 ug | ORAL_TABLET | Freq: Every day | ORAL | 1 refills | Status: DC

## 2021-09-29 MED ORDER — AMLODIPINE BESYLATE 5 MG PO TABS: 5.0000 mg | ORAL_TABLET | Freq: Every day | ORAL | 1 refills | Status: DC

## 2021-09-29 NOTE — Progress Notes (Signed)
Assessment & Plan:  1. Well adult exam Preventive health education provided. - CBC with Differential/Platelet - CMP14+EGFR - Lipid panel - PSA, total and free - Hepatitis C antibody (reflex, frozen specimen) - HIV antibody (with reflex) - Interpretation:  2. Primary hypertension Well controlled on current regimen.  - CBC with Differential/Platelet - CMP14+EGFR - Lipid panel - amLODipine (NORVASC) 5 MG tablet; Take 1 tablet (5 mg total) by mouth daily.  Dispense: 90 tablet; Refill: 1  3. Mixed hyperlipidemia Well controlled on current regimen.  - CMP14+EGFR - Lipid panel - atorvastatin (LIPITOR) 20 MG tablet; Take 1 tablet (20 mg total) by mouth daily.  Dispense: 90 tablet; Refill: 1  4. Acquired hypothyroidism Labs to assess. - TSH - T4, free  5. Vitamin B12 deficiency Labs to assess. - Vitamin B12  6-8. Bipolar affective disorder in remission (HCC)/Anxiety state/Severe episode of recurrent major depressive disorder, without psychotic features ([place]) - Ambulatory referral to Psychiatry  9. Multiple falls - DG Knee 1-2 Views Left; Future - DG Knee 1-2 Views Right; Future - DG Lumbar Spine 2-3 Views; Future  10. Balance problem Declined referral to physical therapy.  11. Musculoskeletal back pain Encouraged application of muscle rub and use of heating pad.   Follow-up: Return in about 6 months (around 03/29/2022) for follow-up of chronic medication conditions.   [name], MSN, APRN, FNP-C [facility]  Subjective:  Patient ID: [name], male    DOB: [birth year]  Age: 61 y.o. MRN: [id number]  Patient Care Team: Loman Brooklyn, FNP as PCP - General (Family Medicine) Aletha Halim., PA-C [facility])   CC:  Chief Complaint  Patient presents with   Annual Exam   Leg Pain    Bilateral on and off leg pain x 1 year. Also has legs giving out.    HPI [name] presents for his annual physical.   Occupation:  disability, Marital status: married, Substance use: none Diet: regular, Exercise: none Last eye exam: last year Last dental exam: last year Last colonoscopy: 03/17/2016 with recommended repeat in 5 years which is this year. Patient does not wish to return YET. Lung Cancer Screening with low-dose Chest CT: denied AAA Screening: not eligible until 62 years of age Hepatitis C Screening: agreeable in completing today PSA: never Immunizations: Flu Vaccine: declined Tdap Vaccine: up to date  Shingrix Vaccine: declined  COVID-19 Vaccine:  declined booster Pneumonia Vaccine: declined  Anxiety/Bipolar/Depression: patient is requesting a referral to a psychiatrist.   Patient reports he has bad knees. He has been falling at least every two weeks due to losing balance or his knees giving out. He states it feels like his whole lower body just quits functioning and he fells over. He is able to get back up immediately. Denies any low back pain. He had a cervical decomp/discectomy fusion in which a cage was placed a year ago and states his feeling of being off balance started then. He takes gabapentin 300 mg twice daily for his neck. He has never done physical therapy for his balance issues and does not plan on it due to a bad past experience with physical therapy. He denies any dizziness.   Patient also reports pain in the upper right side of his back that started yesterday. He feels like he twisted wrong. A hot shower helped this morning.    Review of Systems  Constitutional:  Negative for chills, fever, malaise/fatigue and weight loss.  HENT:  Negative for  congestion, ear discharge, ear pain, nosebleeds, sinus pain, sore throat and tinnitus.   Eyes:  Negative for blurred vision, double vision, pain, discharge and redness.  Respiratory:  Negative for cough, shortness of breath and wheezing.   Cardiovascular:  Negative for chest pain, palpitations and leg swelling.  Gastrointestinal:  Negative for  abdominal pain, constipation, diarrhea, heartburn, nausea and vomiting.  Genitourinary:  Negative for dysuria, frequency and urgency.  Musculoskeletal:  Positive for back pain, falls and neck pain. Negative for myalgias.  Skin:  Negative for rash.  Neurological:  Negative for dizziness, seizures, weakness and headaches.  Psychiatric/Behavioral:  Positive for depression. Negative for substance abuse and suicidal ideas. The patient is nervous/anxious.     Current Outpatient Medications:    amLODipine (NORVASC) 5 MG tablet, TAKE 1 TABLET(5 MG) BY MOUTH DAILY, Disp: 90 tablet, Rfl: 0   atorvastatin (LIPITOR) 20 MG tablet, TAKE 1 TABLET(20 MG) BY MOUTH EVERY EVENING, Disp: 90 tablet, Rfl: 0   citalopram (CELEXA) 20 MG tablet, Take 20 mg by mouth daily., Disp: , Rfl:    gabapentin (NEURONTIN) 300 MG capsule, Take 300 mg by mouth 3 (three) times daily., Disp: , Rfl:    levothyroxine (SYNTHROID) 150 MCG tablet, TAKE 1 TABLET(150 MCG) BY MOUTH DAILY BEFORE BREAKFAST, Disp: 90 tablet, Rfl: 1   QUEtiapine (SEROQUEL XR) 200 MG 24 hr tablet, Take 1 tablet (200 mg total) by mouth at bedtime. (Patient taking differently: Take 100 mg by mouth at bedtime.), Disp: 30 tablet, Rfl: 5 No current facility-administered medications for this visit.  Facility-Administered Medications Ordered in Other Visits:    gadopentetate dimeglumine (MAGNEVIST) injection 15 mL, 15 mL, Intravenous, Once PRN, Sater, Nanine Means, MD  Allergies  Allergen Reactions   Benadryl [Diphenhydramine] Other (See Comments)    "throws me for a loop". Seizure    Past Medical History:  Diagnosis Date   Anxiety and depression    Arthritis    Headache    Hemorrhoids    Hyperlipidemia 02/06/2020   Hypertension    Hypothyroidism    OSA on CPAP    doesn't use often pt states    Pneumonia    Seizures ([place]) 02/2016   only with Benadryl use   Suicide and self-inflicted injury ([place])    Vitamin B12 deficiency 02/06/2020    Past Surgical  History:  Procedure Laterality Date   ANTERIOR CERVICAL DECOMP/DISCECTOMY FUSION N/A 09/20/2020   Procedure: ANTERIOR CERVICAL DECOMPRESSION/DISCECTOMY FUSION CERVICAL THREE- CERVICAL FOUR;  Surgeon: Vallarie Mare, MD;  Location: [place];  Service: Neurosurgery;  Laterality: N/A;   BICEPS TENDON REPAIR Right    CARPAL TUNNEL RELEASE Bilateral    DECORTICATION Left 11/12/2018   Procedure: DECORTICATION;  Surgeon: Ivin Poot, MD;  Location: [place];  Service: Thoracic;  Laterality: Left;   KNEE CARTILAGE SURGERY Left    KNEE SURGERY Right    x 2   LAPAROTOMY N/A 11/03/2018   Procedure: EXPLORATORY LAPAROTOMY;  Surgeon: Erroll Luna, MD;  Location: [place];  Service: General;  Laterality: N/A;   VIDEO ASSISTED THORACOSCOPY (VATS)/EMPYEMA Left 11/12/2018   Procedure: VIDEO ASSISTED THORACOSCOPY (VATS)/EMPYEMA, MINI THORACOTOMY;  Surgeon: Ivin Poot, MD;  Location: [facility];  Service: Thoracic;  Laterality: Left;  needs cell saver    Family History  Problem Relation Age of Onset   Kidney disease Mother    Hypertension Father    Multiple sclerosis Sister    Stroke Brother    Breast cancer Paternal Grandmother  Kidney failure Maternal Grandmother    Kidney disease Maternal Grandmother    Heart defect Maternal Uncle     Social History   Socioeconomic History   Marital status: Married    Spouse name: [name]   Number of children: 1   Years of education: 12   Highest education level: 12th grade  Occupational History   Occupation: Architect    Comment: remodels houses   Occupation: Clinical research associate     Comment: quit last week   Tobacco Use   Smoking status: Every Day    Packs/day: 1.50    Years: 51.00    Pack years: 76.50    Types: Cigarettes    Start date: 1969   Smokeless tobacco: Never  Vaping Use   Vaping Use: Never used  Substance and Sexual Activity   Alcohol use: Not Currently    Comment: none since March 2020   Drug use: Not Currently    Frequency:  3.0 times per week    Types: Marijuana    Comment: 02/03/20- states no    Sexual activity: Not Currently    Partners: Female    Birth control/protection: None  Other Topics Concern   Not on file  Social History Narrative   ** Merged History Encounter **       Lives with wife Caffeine- coffee 2 cups daily   Social Determinants of Health   Financial Resource Strain: Not on file  Food Insecurity: Not on file  Transportation Needs: Not on file  Physical Activity: Not on file  Stress: Not on file  Social Connections: Not on file  Intimate Partner Violence: Not on file      Objective:    BP 121/74    Pulse 66    Temp 97.6 F (36.4 C) (Temporal)    Ht _0  (1.727 m)    Wt 144 lb 3.2 oz (65.4 kg)    SpO2 98%    BMI 21.93 kg/m   Wt Readings from Last 3 Encounters:  09/29/21 144 lb 3.2 oz (65.4 kg)  06/29/21 152 lb 9.6 oz (69.2 kg)  02/18/21 162 lb 3.2 oz (73.6 kg)    Physical Exam Vitals reviewed.  Constitutional:      General: He is not in acute distress.    Appearance: Normal appearance. He is normal weight. He is not ill-appearing, toxic-appearing or diaphoretic.  HENT:     Head: Normocephalic and atraumatic.     Right Ear: Tympanic membrane, ear canal and external ear normal. There is no impacted cerumen.     Left Ear: Tympanic membrane, ear canal and external ear normal. There is no impacted cerumen.     Nose: Nose normal. No congestion or rhinorrhea.     Mouth/Throat:     Mouth: Mucous membranes are moist.     Pharynx: Oropharynx is clear. No oropharyngeal exudate or posterior oropharyngeal erythema.  Eyes:     General: No scleral icterus.       Right eye: No discharge.        Left eye: No discharge.     Conjunctiva/sclera: Conjunctivae normal.     Pupils: Pupils are equal, round, and reactive to light.  Neck:     Vascular: No carotid bruit.  Cardiovascular:     Rate and Rhythm: Normal rate and regular rhythm.     Heart sounds: Normal heart sounds. No murmur  heard.   No friction rub. No gallop.  Pulmonary:     Effort: Pulmonary effort is  normal. No respiratory distress.     Breath sounds: Normal breath sounds. No stridor. No wheezing, rhonchi or rales.  Abdominal:     General: Abdomen is flat. Bowel sounds are normal. There is no distension.     Palpations: Abdomen is soft. There is no hepatomegaly, splenomegaly or mass.     Tenderness: There is no abdominal tenderness. There is no guarding or rebound.     Hernia: No hernia is present.  Musculoskeletal:        General: Normal range of motion.     Cervical back: Normal range of motion and neck supple. No rigidity. No muscular tenderness.     Thoracic back: Tenderness (right side) present. No bony tenderness.     Lumbar back: Normal.     Right lower leg: No edema.     Left lower leg: No edema.  Lymphadenopathy:     Cervical: No cervical adenopathy.  Skin:    General: Skin is warm and dry.     Capillary Refill: Capillary refill takes less than 2 seconds.  Neurological:     General: No focal deficit present.     Mental Status: He is alert and oriented to person, place, and time. Mental status is at baseline.     Gait: Gait abnormal (ambulating with a cane).  Psychiatric:        Mood and Affect: Mood normal.        Behavior: Behavior normal.        Thought Content: Thought content normal.        Judgment: Judgment normal.    Lab Results  Component Value Date   TSH 0.923 02/18/2021   Lab Results  Component Value Date   WBC 8.1 12/03/2020   HGB 14.7 12/03/2020   HCT 44.1 12/03/2020   MCV 94 12/03/2020   PLT 237 12/03/2020   Lab Results  Component Value Date   NA 137 02/18/2021   K 4.8 02/18/2021   CO2 21 02/18/2021   GLUCOSE 83 02/18/2021   BUN 17 02/18/2021   CREATININE 0.98 02/18/2021   BILITOT <0.2 02/18/2021   ALKPHOS 143 (H) 02/18/2021   AST 9 02/18/2021   ALT 7 02/18/2021   PROT 6.5 02/18/2021   ALBUMIN 4.5 02/18/2021   CALCIUM 9.5 02/18/2021   ANIONGAP 9  09/16/2020   EGFR 88 02/18/2021   Lab Results  Component Value Date   CHOL 157 02/18/2021   Lab Results  Component Value Date   HDL 37 (L) 02/18/2021   Lab Results  Component Value Date   LDLCALC 102 (H) 02/18/2021   Lab Results  Component Value Date   TRIG 99 02/18/2021   Lab Results  Component Value Date   CHOLHDL 4.2 02/18/2021   Lab Results  Component Value Date   HGBA1C 4.9 12/10/2018

## 2021-09-29 NOTE — Patient Instructions (Signed)
Heating pad and muscle rub such as Biofreeze to the area of pain on your upper back.

## 2021-09-30 ENCOUNTER — Other Ambulatory Visit: Payer: Self-pay | Admitting: Family Medicine

## 2021-09-30 ENCOUNTER — Ambulatory Visit (INDEPENDENT_AMBULATORY_CARE_PROVIDER_SITE_OTHER): Payer: Medicare Other

## 2021-09-30 DIAGNOSIS — M1712 Unilateral primary osteoarthritis, left knee: Secondary | ICD-10-CM | POA: Diagnosis not present

## 2021-09-30 DIAGNOSIS — R296 Repeated falls: Secondary | ICD-10-CM

## 2021-09-30 DIAGNOSIS — M25561 Pain in right knee: Secondary | ICD-10-CM | POA: Diagnosis not present

## 2021-09-30 DIAGNOSIS — M48061 Spinal stenosis, lumbar region without neurogenic claudication: Secondary | ICD-10-CM | POA: Diagnosis not present

## 2021-09-30 DIAGNOSIS — M545 Low back pain, unspecified: Secondary | ICD-10-CM | POA: Diagnosis not present

## 2021-09-30 DIAGNOSIS — M47816 Spondylosis without myelopathy or radiculopathy, lumbar region: Secondary | ICD-10-CM | POA: Diagnosis not present

## 2021-09-30 DIAGNOSIS — M1711 Unilateral primary osteoarthritis, right knee: Secondary | ICD-10-CM | POA: Diagnosis not present

## 2021-09-30 DIAGNOSIS — M25562 Pain in left knee: Secondary | ICD-10-CM | POA: Diagnosis not present

## 2021-09-30 DIAGNOSIS — E039 Hypothyroidism, unspecified: Secondary | ICD-10-CM

## 2021-09-30 LAB — CBC WITH DIFFERENTIAL/PLATELET
Basophils Absolute: 0.1 10*3/uL (ref 0.0–0.2)
Basos: 1 %
EOS (ABSOLUTE): 0.1 10*3/uL (ref 0.0–0.4)
Eos: 1 %
Hematocrit: 39.9 % (ref 37.5–51.0)
Hemoglobin: 13.5 g/dL (ref 13.0–17.7)
Immature Grans (Abs): 0 10*3/uL (ref 0.0–0.1)
Immature Granulocytes: 0 %
Lymphocytes Absolute: 3.2 10*3/uL — ABNORMAL HIGH (ref 0.7–3.1)
Lymphs: 30 %
MCH: 31.5 pg (ref 26.6–33.0)
MCHC: 33.8 g/dL (ref 31.5–35.7)
MCV: 93 fL (ref 79–97)
Monocytes Absolute: 0.8 10*3/uL (ref 0.1–0.9)
Monocytes: 8 %
Neutrophils Absolute: 6.5 10*3/uL (ref 1.4–7.0)
Neutrophils: 60 %
Platelets: 220 10*3/uL (ref 150–450)
RBC: 4.29 x10E6/uL (ref 4.14–5.80)
RDW: 13.6 % (ref 11.6–15.4)
WBC: 10.7 10*3/uL (ref 3.4–10.8)

## 2021-09-30 LAB — T4, FREE: Free T4: 2.2 ng/dL — ABNORMAL HIGH (ref 0.82–1.77)

## 2021-09-30 LAB — CMP14+EGFR
ALT: 7 IU/L (ref 0–44)
AST: 13 IU/L (ref 0–40)
Albumin/Globulin Ratio: 1.9 (ref 1.2–2.2)
Albumin: 4.4 g/dL (ref 3.8–4.8)
Alkaline Phosphatase: 125 IU/L — ABNORMAL HIGH (ref 44–121)
BUN/Creatinine Ratio: 13 (ref 10–24)
BUN: 13 mg/dL (ref 8–27)
Bilirubin Total: 0.4 mg/dL (ref 0.0–1.2)
CO2: 22 mmol/L (ref 20–29)
Calcium: 9.7 mg/dL (ref 8.6–10.2)
Chloride: 104 mmol/L (ref 96–106)
Creatinine, Ser: 1 mg/dL (ref 0.76–1.27)
Globulin, Total: 2.3 g/dL (ref 1.5–4.5)
Glucose: 81 mg/dL (ref 70–99)
Potassium: 4.5 mmol/L (ref 3.5–5.2)
Sodium: 140 mmol/L (ref 134–144)
Total Protein: 6.7 g/dL (ref 6.0–8.5)
eGFR: 86 mL/min/{1.73_m2} (ref >59)

## 2021-09-30 LAB — VITAMIN B12: Vitamin B-12: 733 pg/mL (ref 232–1245)

## 2021-09-30 LAB — LIPID PANEL
Chol/HDL Ratio: 2.8 ratio (ref 0.0–5.0)
Cholesterol, Total: 103 mg/dL (ref 100–199)
HDL: 37 mg/dL — ABNORMAL LOW (ref >39)
LDL Chol Calc (NIH): 51 mg/dL (ref 0–99)
Triglycerides: 72 mg/dL (ref 0–149)
VLDL Cholesterol Cal: 15 mg/dL (ref 5–40)

## 2021-09-30 LAB — HCV INTERPRETATION

## 2021-09-30 LAB — PSA, TOTAL AND FREE
PSA, Free Pct: 31.3 %
PSA, Free: 0.25 ng/mL
Prostate Specific Ag, Serum: 0.8 ng/mL (ref 0.0–4.0)

## 2021-09-30 LAB — HCV AB W REFLEX TO QUANT PCR: HCV Ab: NONREACTIVE

## 2021-09-30 LAB — TSH: TSH: 0.078 u[IU]/mL — ABNORMAL LOW (ref 0.450–4.500)

## 2021-09-30 LAB — HIV ANTIBODY (ROUTINE TESTING W REFLEX): HIV Screen 4th Generation wRfx: NONREACTIVE

## 2021-09-30 IMAGING — DX DG KNEE 1-2V*L*
1 series · 1 of 1 positions shown · non-contrast
Comparison: None.

CLINICAL DATA: Multiple falls.  Pain.

EXAM:
LEFT KNEE - 1-2 VIEW; RIGHT KNEE - 1-2 VIEW

[knee lat]
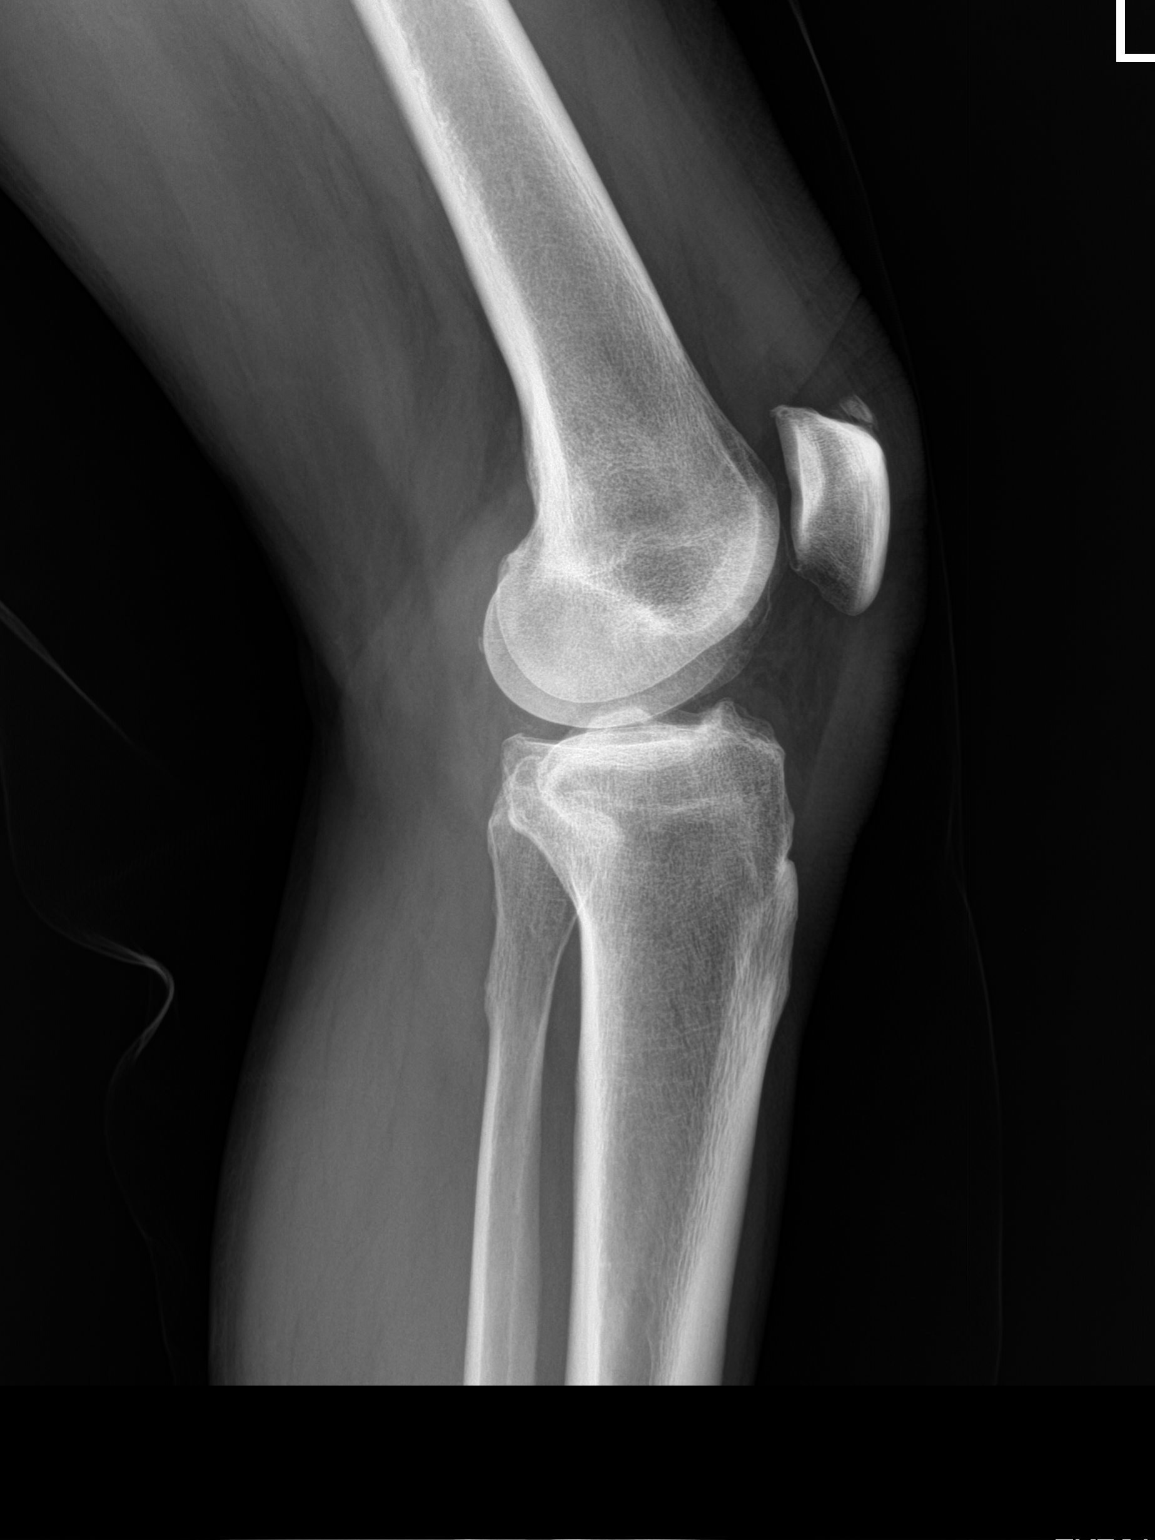

[1 of 1 positions shown; findings below may reference images not displayed]

FINDINGS: Right knee:

Moderate to severe lateral compartment joint space narrowing with
moderate peripheral degenerative osteophytes. Mild medial
compartment joint space narrowing. Medial compartment
chondrocalcinosis. There are 2 metallic pins versus thin nails from
distal anterior approach traversing the weight-bearing lateral
femoral condyle. There is a lucent likely osteochondral defect
overlapping these this hardware on frontal view, measuring up to 9
mm in transverse dimension. However, this osteochondral defect
likely is more posterior than the hardware on lateral view.
Mild-to-moderate joint effusion. Moderate superior patellar
degenerative osteophytosis.

Left knee:

Mild medial compartment joint space narrowing. Mild patellofemoral
joint space narrowing and superior patellar degenerative
osteophytosis. Small joint effusion. Mild chronic enthesopathic
change at the quadriceps insertion on the patella.
IMPRESSION: :
IMPRESSION: Right knee:

Orthopedic hardware overlying the weight-bearing lateral femoral
condyle. Moderate to severe lateral compartment osteoarthritis.
Subchondral lucent degenerative cyst versus osteochondral defect
within the weight-bearing lateral femoral condyle.

Left knee:

Mild patellofemoral osteoarthritis.

## 2021-09-30 IMAGING — DX DG LUMBAR SPINE 2-3V
2 series · 3 of 3 positions shown · non-contrast
Comparison: None.

CLINICAL DATA: Multiple falls.

EXAM:
LUMBAR SPINE - 2-3 VIEW

[Series 1: l-spine ap · 0.14mm/px · 2 of 2 slices shown]
[im 1/2]
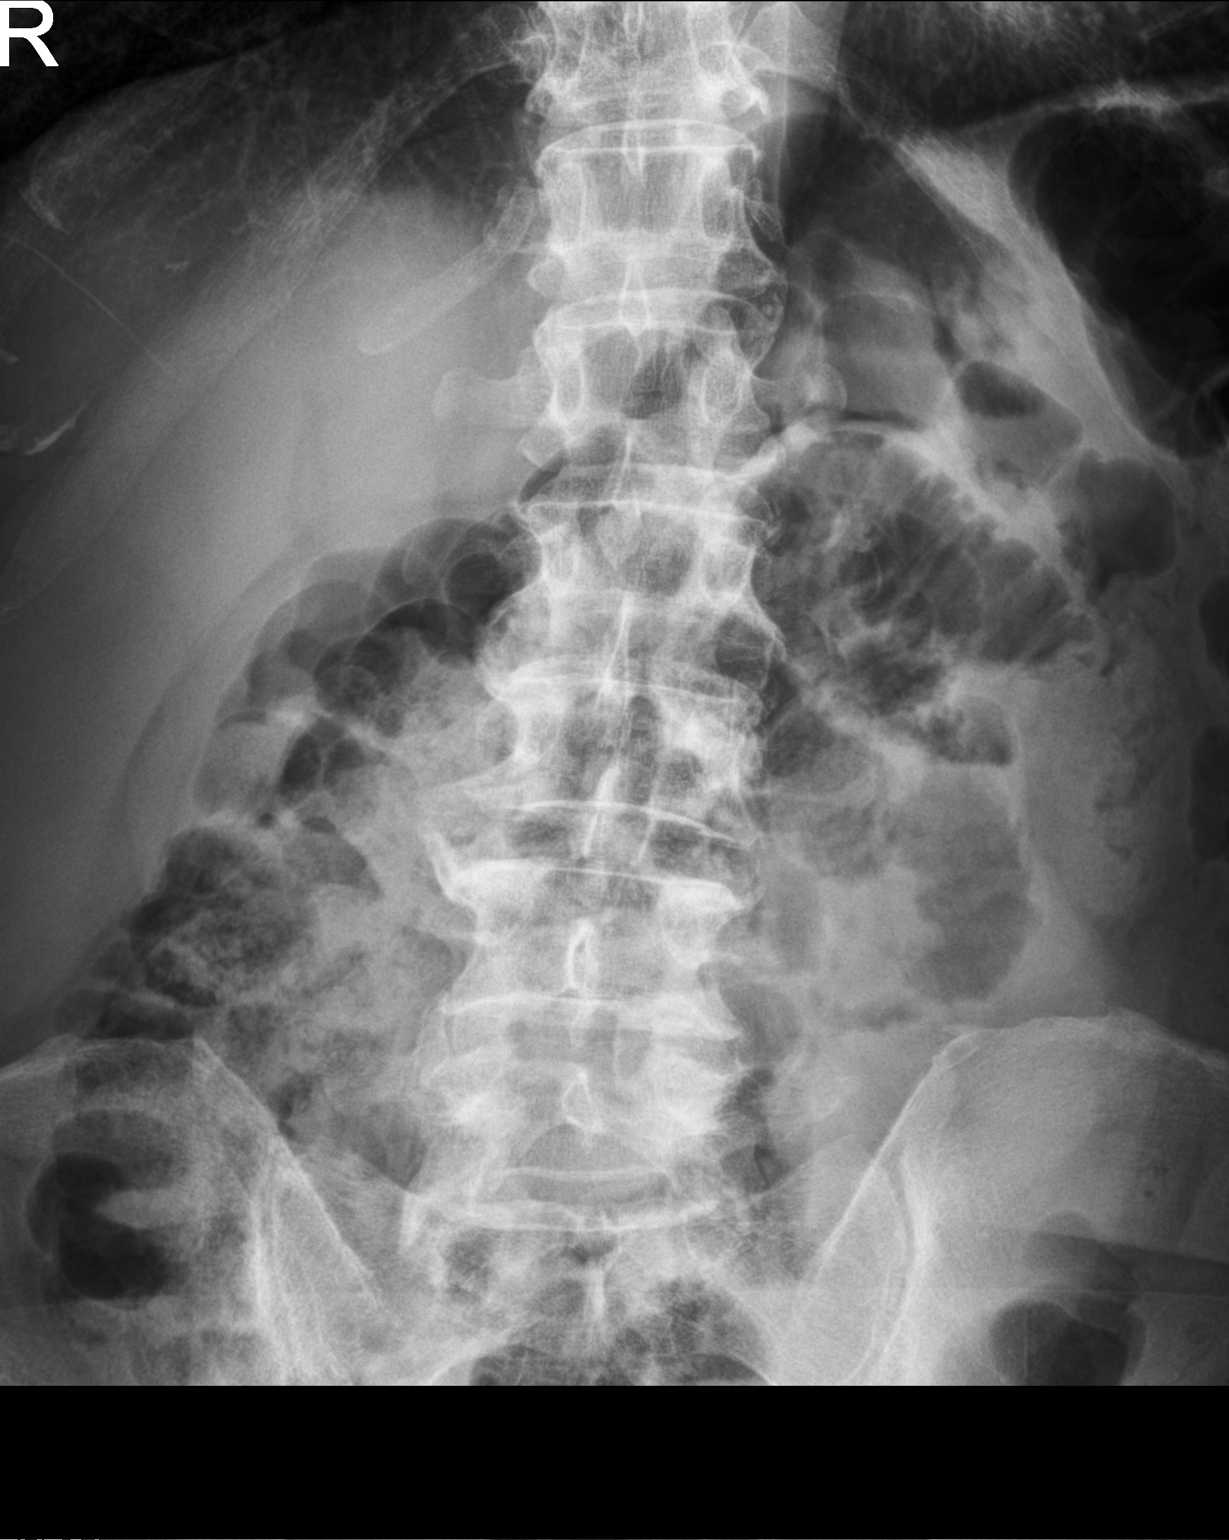
[im 2/2]
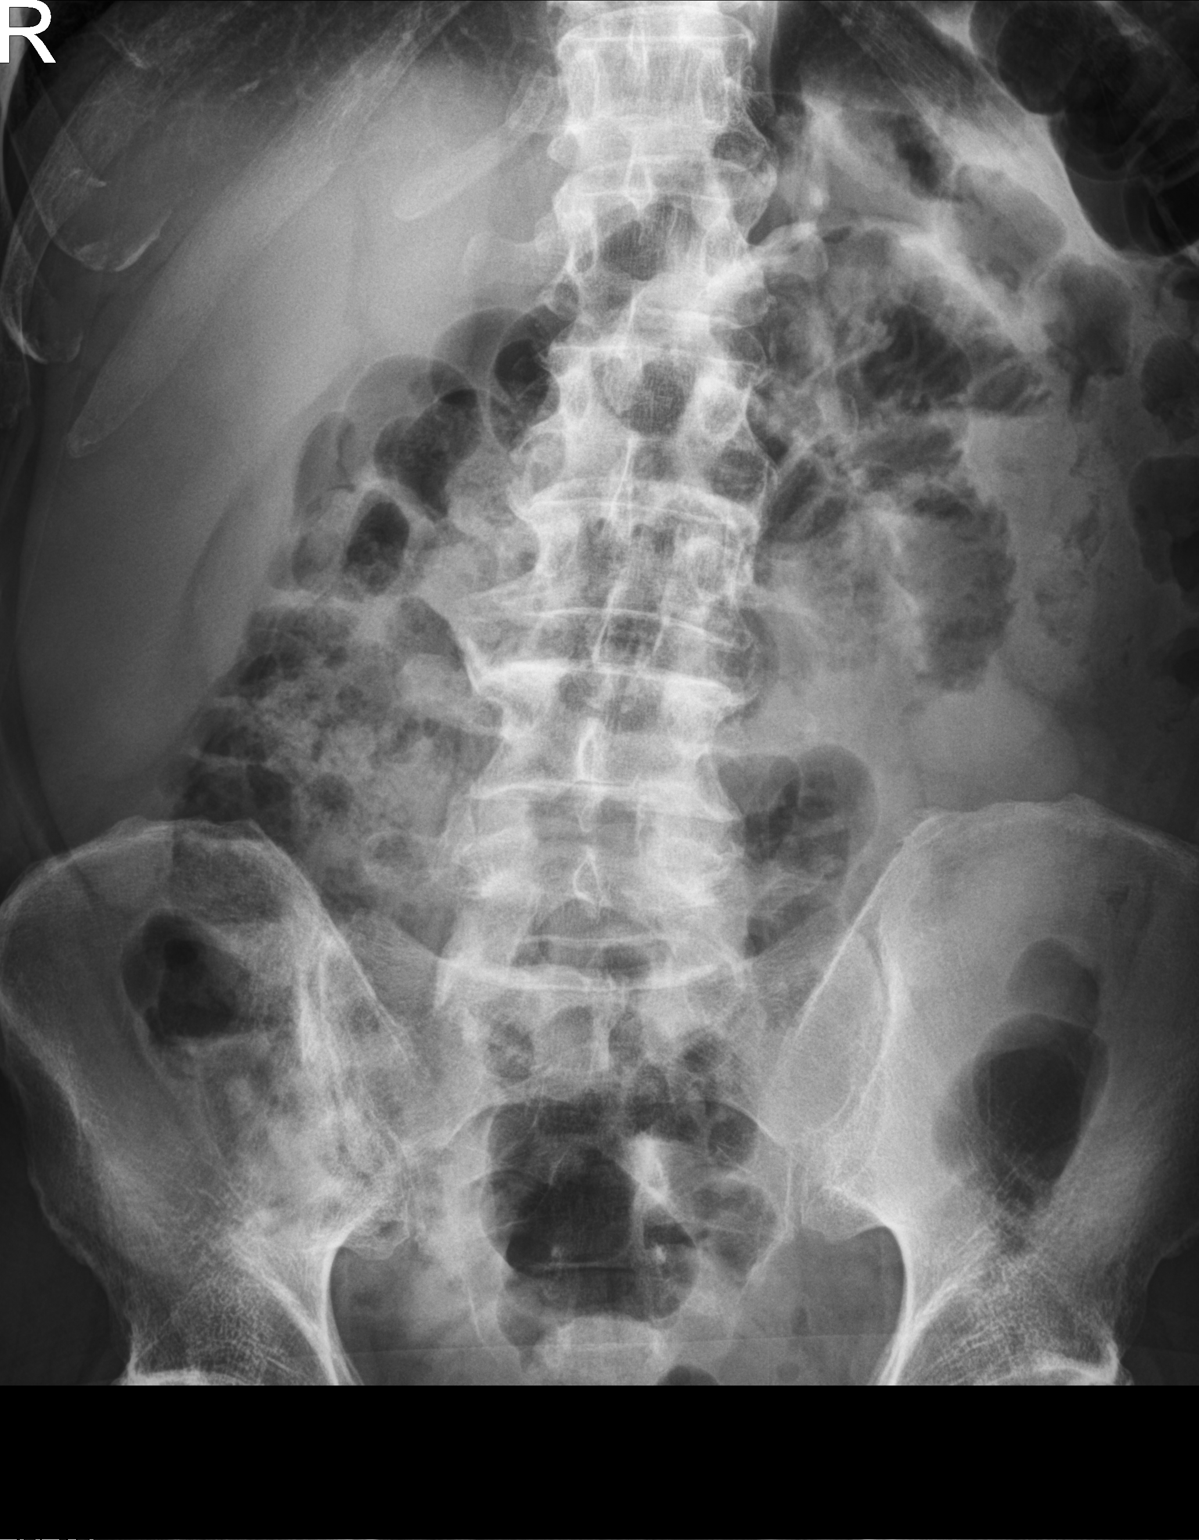

[l-spine lat]
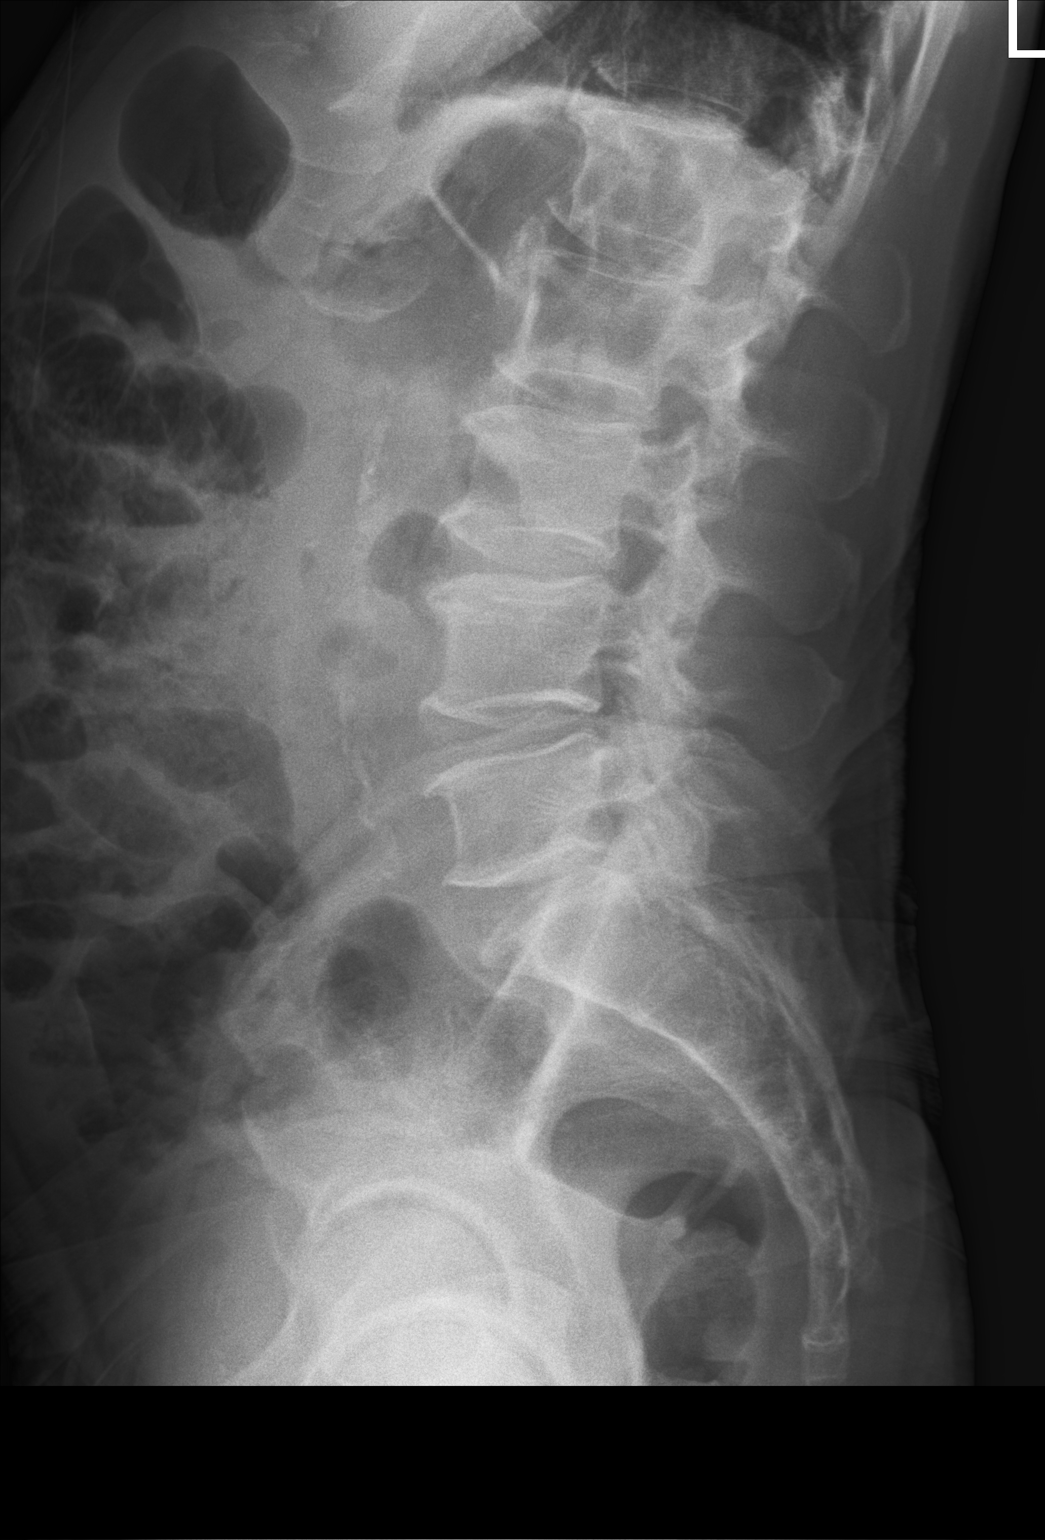

[3 of 3 positions shown; findings below may reference images not displayed]

FINDINGS: Fibroadenoma. There is no acute fracture or subluxation of the
lumbar spine. Multilevel degenerative changes with disc space
narrowing and spurring. Lower lumbar facet arthropathy. The
visualized posterior elements are intact. Atherosclerotic
calcifications of the abdominal aorta. Moderate amount of stool
throughout the colon. The soft tissues are unremarkable.
IMPRESSION: 1. No acute fracture or subluxation of the lumbar spine.
2. Multilevel degenerative changes.

## 2021-09-30 MED ORDER — LEVOTHYROXINE SODIUM 137 MCG PO TABS
150.0000 ug | ORAL_TABLET | Freq: Every day | ORAL | 2 refills | Status: DC
Start: 2021-09-30 — End: 2022-03-31

## 2021-10-02 ENCOUNTER — Encounter: Payer: Self-pay | Admitting: Family Medicine

## 2021-10-03 ENCOUNTER — Other Ambulatory Visit: Payer: Self-pay | Admitting: Family Medicine

## 2021-10-03 DIAGNOSIS — I10 Essential (primary) hypertension: Secondary | ICD-10-CM

## 2021-11-04 ENCOUNTER — Ambulatory Visit (INDEPENDENT_AMBULATORY_CARE_PROVIDER_SITE_OTHER): Payer: Medicare Other | Admitting: Orthopedic Surgery

## 2021-11-04 ENCOUNTER — Encounter: Payer: Self-pay | Admitting: Orthopedic Surgery

## 2021-11-04 VITALS — BP 142/85 | HR 67 | Ht 68.0 in | Wt 150.0 lb

## 2021-11-04 DIAGNOSIS — G8929 Other chronic pain: Secondary | ICD-10-CM

## 2021-11-04 DIAGNOSIS — M25561 Pain in right knee: Secondary | ICD-10-CM

## 2021-11-04 DIAGNOSIS — M25562 Pain in left knee: Secondary | ICD-10-CM | POA: Diagnosis not present

## 2021-11-04 NOTE — Progress Notes (Signed)
Orthopaedic Clinic Return ? ?Assessment: ?[name] is a 61 y.o. male with the following: ?Bilateral knee arthritis, but he is not limited by pain; prominent hardware within the right lateral knee. ? ?Plan: ?[name] has bilateral knee pain.  He states this is tolerable.  Occasionally, depending on the weather, the pain will get worse.  We reviewed radiographs of bilateral knees, and I showed him where the hardware within the right knee is prominent.  This could be contributing to pain that he is experiencing in the lateral compartment of the right knee.  Once again, he stated that the pain is not severe.  He is more concerned about his mobility, and continued falls despite neck surgery a little over 1 year ago.  I have encouraged him to continue to follow-up with his neurosurgeon.  If the pain in his knees is severe enough, we can consider an injection.  If the pain worsens within the lateral compartment of his right knee, this would be most likely result of the prominent hardware.  I would recommend removal of this hardware, which potentially could be an arthroscopic procedure.  He stated his understanding.  He is comfortable with the current treatment plan.  He will follow-up in clinic if needed. ? ? ?Follow-up: ?Return if symptoms worsen or fail to improve. ? ? ?Subjective: ? ?Chief Complaint  ?Patient presents with  ? Knee Pain  ?  Bil knee pain for 40 + yrs. Has had surgery on both knees previously. Has been falling after feeling a sharp pain through the lt knee followed by loss of feeling from the knee down.   ? ? ?History of Present Illness: ?[name] is a 61 y.o. male who returns to clinic for evaluation of bilateral knee pain.  I had previously evaluated him for neck pain, and he has subsequently undergone surgery on his neck.  He states he has had pain in both knees for many years.  He has had surgery on both the right and the left knee.  He is it is unclear what was addressed with  surgery on his right knee.  He continues to have issues with falls, and does not feel as though it is coming from his knees.  He has some pain in the left knee which is bothering him more than his right knee.  Occasional sharp pains in the right knee.  He is not taking medication specifically for his knees.  He states the pain gets worse with cold weather.  Occasionally if it rains, his pain will worsen.  Otherwise, he does not have a lot of pain during the warm summer months. ? ?Review of Systems: ?No fevers or chills ?No chest pain ?No shortness of breath ?No bowel or bladder dysfunction ?No GI distress ?No headaches ? ? ?Objective: ?BP (!) 142/85   Pulse 67   Ht '5\' 8"'$  (1.727 m)   Wt 150 lb (68 kg)   BMI 22.81 kg/m?  ? ?Physical Exam: ? ?Slow, unsteady gait. ? ?Evaluation of the right knee demonstrates medial and lateral based incisions, which are well-healed.  No effusion is appreciated.  No sharp pain with full extension.  He does have tenderness to palpation within the lateral compartment.  Negative Lachman.  No increased laxity to varus or valgus stress. ? ?Evaluation of left knee demonstrates no effusion.  Well-healed surgical incisions.  Mild tenderness to palpation along the medial lateral joint lines.  Increased laxity varus valgus stress.  Negative Lachman. ? ?IMAGING: ?  I personally ordered and reviewed the following images: ? ?X-rays of both knees were previously obtained.  Prominent hardware within the lateral compartment of the right knee.  Associated degenerative changes.  Left knee with mild loss of joint space within the medial compartment. ? ?Raymond Rasmussen, MD ?11/04/2021 ?12:23 PM ? ? ?

## 2021-11-04 NOTE — Patient Instructions (Signed)

## 2021-11-09 ENCOUNTER — Other Ambulatory Visit: Payer: Self-pay | Admitting: Family Medicine

## 2021-11-09 DIAGNOSIS — I10 Essential (primary) hypertension: Secondary | ICD-10-CM

## 2021-11-10 ENCOUNTER — Other Ambulatory Visit: Payer: Self-pay | Admitting: Family Medicine

## 2021-11-10 DIAGNOSIS — E039 Hypothyroidism, unspecified: Secondary | ICD-10-CM

## 2021-11-15 ENCOUNTER — Other Ambulatory Visit: Payer: Self-pay | Admitting: Family Medicine

## 2021-11-15 DIAGNOSIS — E782 Mixed hyperlipidemia: Secondary | ICD-10-CM

## 2021-11-25 DIAGNOSIS — M4712 Other spondylosis with myelopathy, cervical region: Secondary | ICD-10-CM | POA: Diagnosis not present

## 2021-12-20 ENCOUNTER — Other Ambulatory Visit: Payer: Self-pay | Admitting: Family Medicine

## 2021-12-20 DIAGNOSIS — E782 Mixed hyperlipidemia: Secondary | ICD-10-CM

## 2021-12-29 ENCOUNTER — Other Ambulatory Visit: Payer: Self-pay | Admitting: Family Medicine

## 2021-12-29 DIAGNOSIS — E039 Hypothyroidism, unspecified: Secondary | ICD-10-CM

## 2022-02-09 ENCOUNTER — Other Ambulatory Visit: Payer: Self-pay | Admitting: Family Medicine

## 2022-02-09 DIAGNOSIS — E039 Hypothyroidism, unspecified: Secondary | ICD-10-CM

## 2022-02-12 ENCOUNTER — Other Ambulatory Visit: Payer: Self-pay | Admitting: Family Medicine

## 2022-02-12 DIAGNOSIS — I10 Essential (primary) hypertension: Secondary | ICD-10-CM

## 2022-02-20 ENCOUNTER — Telehealth: Payer: Self-pay | Admitting: Family Medicine

## 2022-02-20 NOTE — Telephone Encounter (Signed)
I spoke to pt and advised him to call pharmacy for refill as he should have another 90 day supply at the pharmacy and pt voiced understanding.

## 2022-02-20 NOTE — Telephone Encounter (Signed)
  Prescription Request  02/20/2022  Is this a "Controlled Substance" medicine? No  Have you seen your PCP in the last 2 weeks? no  If YES, route message to pool  -  If NO, patient needs to be scheduled for appointment.  What is the name of the medication or equipment? Amlodipine 5 mg  Have you contacted your pharmacy to request a refill? no   Which pharmacy would you like this sent to? [facility]   Patient notified that their request is being sent to the clinical staff for review and that they should receive a response within 2 business days.

## 2022-03-09 ENCOUNTER — Encounter: Payer: Self-pay | Admitting: Family Medicine

## 2022-03-09 ENCOUNTER — Ambulatory Visit (INDEPENDENT_AMBULATORY_CARE_PROVIDER_SITE_OTHER): Payer: Medicare Other | Admitting: Family Medicine

## 2022-03-09 VITALS — BP 103/64 | HR 58 | Temp 97.6°F | Ht 68.0 in | Wt 145.2 lb

## 2022-03-09 DIAGNOSIS — Z0000 Encounter for general adult medical examination without abnormal findings: Secondary | ICD-10-CM

## 2022-03-09 DIAGNOSIS — R9431 Abnormal electrocardiogram [ECG] [EKG]: Secondary | ICD-10-CM

## 2022-03-09 DIAGNOSIS — Z0001 Encounter for general adult medical examination with abnormal findings: Secondary | ICD-10-CM

## 2022-03-09 DIAGNOSIS — E782 Mixed hyperlipidemia: Secondary | ICD-10-CM

## 2022-03-09 MED ORDER — ATORVASTATIN CALCIUM 20 MG PO TABS: 20.0000 mg | ORAL_TABLET | Freq: Every day | ORAL | 1 refills | Status: DC

## 2022-03-09 NOTE — Progress Notes (Signed)
Subjective:   [name] is a 61 y.o. male who presents for a Welcome to Medicare exam.   Review of Systems: Cardiac Risk Factors include: advanced age (>53mn, >>63women);male gender;smoking/ tobacco exposure     Objective:    Today's Vitals   03/09/22 1448  BP: 103/64  Pulse: (!) 58  Temp: 97.6 F (36.4 C)  TempSrc: Temporal  SpO2: 96%  Weight: 145 lb 3.2 oz (65.9 kg)  Height: '5\' 8"'$  (1.727 m)   Body mass index is 22.08 kg/m.  Medications Outpatient Encounter Medications as of 03/09/2022  Medication Sig   amLODipine (NORVASC) 5 MG tablet TAKE 1 TABLET BY MOUTH DAILY   atorvastatin (LIPITOR) 20 MG tablet TAKE 1 TABLET BY MOUTH DAILY   citalopram (CELEXA) 20 MG tablet Take 20 mg by mouth daily.   cyanocobalamin (VITAMIN B12) 1000 MCG tablet Take 1,000 mcg by mouth daily.   levothyroxine (SYNTHROID) 137 MCG tablet Take 1 tablet (137 mcg total) by mouth daily before breakfast.   gabapentin (NEURONTIN) 300 MG capsule Take 300 mg by mouth 3 (three) times daily. (Patient not taking: Reported on 03/09/2022)   QUEtiapine (SEROQUEL XR) 200 MG 24 hr tablet Take 1 tablet (200 mg total) by mouth at bedtime. (Patient taking differently: Take 100 mg by mouth at bedtime.)   Facility-Administered Encounter Medications as of 03/09/2022  Medication   gadopentetate dimeglumine (MAGNEVIST) injection 15 mL     History: Past Medical History:  Diagnosis Date   Anxiety and depression    Arthritis    Headache    Hemorrhoids    Hyperlipidemia 02/06/2020   Hypertension    Hypothyroidism    OSA on CPAP    doesn't use often pt states    Pneumonia    [place]iz[place] [place]   only with Benadryl use   [place]    Vitamin B12 deficiency 02/06/2020   Past Surgical History:  Procedure Laterality Date   ANTERIOR CERVICAL DECOMP/DISCECTOMY FUSION N/A 09/20/2020   Procedure: ANTERIOR CERVICAL DECOMPRESSION/DISCECTOMY FUSION CERVICAL THREE- CERVICAL FOUR;   Surgeon: TVallarie Mare MD;  [facility]  Service: Neurosurgery;  Laterality: N/A;   BICEPS TENDON REPAIR Right    CARPAL TUNNEL RELEASE Bilateral    DECORTICATION Left 11/12/2018   Procedure: DECORTICATION;  Surgeon: VPrescott Gum PCollier Salina MD;  [facility]  Service: Thoracic;  Laterality: Left;   KNEE CARTILAGE SURGERY Left    KNEE SURGERY Right    x 2   LAPAROTOMY N/A 11/03/2018   Procedure: EXPLORATORY LAPAROTOMY;  Surgeon: CErroll Luna MD;  [facility]  Service: General;  Laterality: N/A;   VIDEO ASSISTED THORACOSCOPY (VATS)/EMPYEMA Left 11/12/2018   Procedure: VIDEO ASSISTED THORACOSCOPY (VATS)/EMPYEMA, MINI THORACOTOMY;  Surgeon: VIvin Poot MD;  [facility]  Service: Thoracic;  Laterality: Left;  needs cell saver    Family History  Problem Relation Age of Onset   Kidney disease Mother    Hypertension Father    Multiple sclerosis Sister    Stroke Brother    Breast cancer Paternal Grandmother    Kidney failure Maternal Grandmother    Kidney disease Maternal Grandmother    Heart defect Maternal Uncle    Social History   Occupational History   Occupation: cArchitect   Comment: remodels houses   Occupation: iClinical research associate    Comment: quit last week   Tobacco Use   Smoking status: Every Day    Packs/day: 1.50  Years: 51.00    Total pack years: 76.50    Types: Cigarettes    Start date: 1969   Smokeless tobacco: Never  Vaping Use   Vaping Use: Never used  Substance and Sexual Activity   Alcohol use: Not Currently    Comment: none since March 2020   Drug use: Not Currently    Frequency: 3.0 times per week    Types: Marijuana    Comment: 02/03/20- states no    Sexual activity: Not Currently    Partners: Female    Birth control/protection: None   Tobacco Counseling Ready to quit: No Counseling given: Yes  Immunizations and Health Maintenance Immunization History  Administered Date(s) Administered   Influenza-Unspecified  06/19/2011   Janssen (J&J) SARS-COV-2 Vaccination 11/05/2019   Tdap 02/10/2009   There are no preventive care reminders to display for this patient.  Activities of Daily Living    03/09/2022    2:51 PM  In your present state of health, do you have any difficulty performing the following activities:  Hearing? 0  Vision? 1  Comment last eye exam 3-4 years ago. pt reports headaches after wearing glasses for an hour  Difficulty concentrating or making decisions? 0  Walking or climbing stairs? 1  Comment only if it is a lot of stairs; he only has a few stairs going off the back deck which he can manage - they already have handrails  Dressing or bathing? 0  Doing errands, shopping? 0  Preparing Food and eating ? N  Using the Toilet? N  In the past six months, have you accidently leaked urine? N  Do you have problems with loss of bowel control? N  Managing your Medications? N  Managing your Finances? N  Housekeeping or managing your Housekeeping? N   Advanced Directives: Does Patient Have a Medical Advance Directive?: Yes Type of Advance Directive: Living will Does patient want to make changes to medical advance directive?: No - Patient declined    Assessment:    This is a routine wellness  examination for this patient .   Vision/Hearing screen No results found. Encouraged to schedule eye exam due to concerns with vision.  EKG: bradycardia, premature ventricular beats  Dietary issues and exercise activities discussed:  Current Exercise Habits: Home exercise routine, Type of exercise: walking, Time (Minutes): 20, Frequency (Times/Week): 7, Weekly Exercise (Minutes/Week): 140, Intensity: Mild   Goals      Obtain Annual Eye Exam     Patient to schedule eye exam.        Depression Screen    03/09/2022    3:04 PM 09/29/2021    3:07 PM 06/29/2021   10:26 AM 12/03/2020    2:42 PM  PHQ 2/9 Scores  PHQ - 2 Score '5 5 3 2  '$ PHQ- 9 Score '13 13 10 7     '$ Fall Risk    03/09/2022     3:03 PM  Fall Risk   Falls in the past year? 1  Number falls in past yr: 1  Injury with Fall? 0  Follow up Falls prevention discussed    Cognitive Function    03/09/2022    2:54 PM  MMSE - Mini Mental State Exam  Orientation to time 5  Orientation to Place 5  Registration 3  Attention/ Calculation 5  Recall 2  Language- name 2 objects 2  Language- repeat 1  Language- follow 3 step command 3  Language- read & follow direction 1  Write a sentence  1  Copy design 1  Total score 29        Patient Care Team: Loman Brooklyn, FNP as PCP - General (Family Medicine) Aletha Halim., PA-C [facility])     Plan:    Referred to cardiology for abnormal EKG.  I have personally reviewed and noted the following in the patient's chart:   Medical and social history Use of alcohol, tobacco or illicit drugs  Current medications and supplements Functional ability and status Nutritional status Physical activity Advanced directives List of other physicians Hospitalizations, surgeries, and ER visits in previous 12 months Vitals Screenings to include cognitive, depression, and falls Referrals and appointments  In addition, I have reviewed and discussed with patient certain preventive protocols, quality metrics, and best practice recommendations. A written personalized care plan for preventive services as well as general preventive health recommendations were provided to patient.    Loman Brooklyn, FNP 03/09/2022

## 2022-03-21 ENCOUNTER — Telehealth: Payer: Self-pay | Admitting: Family Medicine

## 2022-03-21 NOTE — Telephone Encounter (Signed)
That is fine. His appointment is for a chronic follow-up.

## 2022-03-30 ENCOUNTER — Other Ambulatory Visit: Payer: Self-pay | Admitting: Family Medicine

## 2022-03-30 ENCOUNTER — Encounter: Payer: Self-pay | Admitting: Family Medicine

## 2022-03-30 ENCOUNTER — Ambulatory Visit (INDEPENDENT_AMBULATORY_CARE_PROVIDER_SITE_OTHER): Payer: Medicare Other | Admitting: Family Medicine

## 2022-03-30 VITALS — BP 114/72 | HR 71 | Temp 97.2°F | Ht 68.0 in | Wt 138.8 lb

## 2022-03-30 DIAGNOSIS — E538 Deficiency of other specified B group vitamins: Secondary | ICD-10-CM | POA: Diagnosis not present

## 2022-03-30 DIAGNOSIS — E039 Hypothyroidism, unspecified: Secondary | ICD-10-CM | POA: Diagnosis not present

## 2022-03-30 DIAGNOSIS — E782 Mixed hyperlipidemia: Secondary | ICD-10-CM | POA: Diagnosis not present

## 2022-03-30 DIAGNOSIS — I10 Essential (primary) hypertension: Secondary | ICD-10-CM | POA: Diagnosis not present

## 2022-03-30 DIAGNOSIS — G4733 Obstructive sleep apnea (adult) (pediatric): Secondary | ICD-10-CM | POA: Diagnosis not present

## 2022-03-30 DIAGNOSIS — F4323 Adjustment disorder with mixed anxiety and depressed mood: Secondary | ICD-10-CM

## 2022-03-30 DIAGNOSIS — F3170 Bipolar disorder, currently in remission, most recent episode unspecified: Secondary | ICD-10-CM

## 2022-03-30 NOTE — Progress Notes (Signed)
Assessment & Plan:  1. Acquired hypothyroidism Labs to assess after dose adjustment. - T4, free - TSH  2. Essential hypertension Well controlled on current regimen.   3. Mixed hyperlipidemia Well controlled on current regimen.   4. OSA (obstructive sleep apnea) Continue wearing CPAP nightly.  5. Adjustment disorder with mixed anxiety and depressed mood Managed by psychiatry.  6. Bipolar affective disorder in remission [facility]) Managed by psychiatry.  7. Vitamin B12 deficiency Well controlled on current regimen.    Return in about 4 months (around 07/30/2022) for follow-up of chronic medication conditions.  Raymond Limes, MSN, APRN, FNP-C [facility]  Subjective:    Patient ID: [name], male    DOB: 1961-02-22, 61 y.o.   MRN: [id number]  Patient Care Team: Raymond Brooklyn, FNP as PCP - General (Family Medicine) Raymond Halim PA-C [facility])   Chief Complaint:  Chief Complaint  Patient presents with   Medical Management of Chronic Issues    HPI: [name] is a 61 y.o. male presenting on 03/30/2022 for Medical Management of Chronic Issues  Bipolar/Anxiety/Depression: managed by psychiatry.   Hypothyroidism: last levothyroxine dose decreased from 150 mcg to 137 mcg in February 2023.  B12 deficiency: patient reports he has been taking a B12 supplement. Last level normal in February 2023.  Hypertension: does not check BP at home.   Hyperlipidemia: taking atorvastatin daily.   OSA: wearing CPAP nightly.  New complaints: None   Social history:  Relevant past medical, surgical, family and social history reviewed and updated as indicated. Interim medical history since our last visit reviewed.  Allergies and medications reviewed and updated.  DATA REVIEWED: CHART IN EPIC  ROS: Negative unless specifically indicated above in HPI.    Current Outpatient Medications:    amLODipine (NORVASC) 5 MG tablet, TAKE  1 TABLET BY MOUTH DAILY, Disp: 90 tablet, Rfl: 1   atorvastatin (LIPITOR) 20 MG tablet, Take 1 tablet (20 mg total) by mouth daily., Disp: 100 tablet, Rfl: 1   citalopram (CELEXA) 20 MG tablet, Take 20 mg by mouth daily., Disp: , Rfl:    cyanocobalamin (VITAMIN B12) 1000 MCG tablet, Take 1,000 mcg by mouth daily., Disp: , Rfl:    levothyroxine (SYNTHROID) 137 MCG tablet, Take 1 tablet (137 mcg total) by mouth daily before breakfast., Disp: 30 tablet, Rfl: 2   QUEtiapine (SEROQUEL XR) 200 MG 24 hr tablet, Take 1 tablet (200 mg total) by mouth at bedtime. (Patient taking differently: Take 100 mg by mouth at bedtime.), Disp: 30 tablet, Rfl: 5 No current facility-administered medications for this visit.  Facility-Administered Medications Ordered in Other Visits:    gadopentetate dimeglumine (MAGNEVIST) injection 15 mL, 15 mL, Intravenous, Once PRN, Sater, Raymond Means, MD   Allergies  Allergen Reactions   Benadryl [Diphenhydramine] Other (See Comments)    "throws me for a loop". Seizure   Diphenhydramine Hcl Other (See Comments)   Past Medical History:  Diagnosis Date   Anxiety and depression    Arthritis    Headache    Hemorrhoids    Hyperlipidemia 02/06/2020   Hypertension    Hypothyroidism    OSA on CPAP    doesn't use often pt states    Pneumonia    Seizures ([place]) 02/2016   only with Benadryl use   Suicide and self-inflicted injury ([place])    Vitamin B12 deficiency 02/06/2020    Past Surgical History:  Procedure Laterality Date   ANTERIOR CERVICAL DECOMP/DISCECTOMY FUSION N/A  09/20/2020   Procedure: ANTERIOR CERVICAL DECOMPRESSION/DISCECTOMY FUSION CERVICAL THREE- CERVICAL FOUR;  Surgeon: Vallarie Mare, MD;  Location: [place];  Service: Neurosurgery;  Laterality: N/A;   BICEPS TENDON REPAIR Right    CARPAL TUNNEL RELEASE Bilateral    DECORTICATION Left 11/12/2018   Procedure: DECORTICATION;  Surgeon: Prescott Gum, Collier Salina, MD;  Location: [facility];  Service: Thoracic;  Laterality: Left;    KNEE CARTILAGE SURGERY Left    KNEE SURGERY Right    x 2   LAPAROTOMY N/A 11/03/2018   Procedure: EXPLORATORY LAPAROTOMY;  Surgeon: Erroll Luna, MD;  Location: [facility];  Service: General;  Laterality: N/A;   VIDEO ASSISTED THORACOSCOPY (VATS)/EMPYEMA Left 11/12/2018   Procedure: VIDEO ASSISTED THORACOSCOPY (VATS)/EMPYEMA, MINI THORACOTOMY;  Surgeon: Ivin Poot, MD;  Location: [place];  Service: Thoracic;  Laterality: Left;  needs cell saver    Social History   Socioeconomic History   Marital status: Married    Spouse name: [name]   Number of children: 1   Years of education: 12   Highest education level: 12th grade  Occupational History   Occupation: Architect    Comment: remodels houses   Occupation: Clinical research associate     Comment: quit last week   Tobacco Use   Smoking status: Every Day    Packs/day: 1.50    Years: 51.00    Total pack years: 76.50    Types: Cigarettes    Start date: 1969   Smokeless tobacco: Never  Vaping Use   Vaping Use: Never used  Substance and Sexual Activity   Alcohol use: Not Currently    Comment: none since March 2020   Drug use: Not Currently    Frequency: 3.0 times per week    Types: Marijuana    Comment: 02/03/20- states no    Sexual activity: Not Currently    Partners: Female    Birth control/protection: None  Other Topics Concern   Not on file  Social History Narrative   ** Merged History Encounter **       Lives with wife Caffeine- coffee 2 cups daily   Social Determinants of Health   Financial Resource Strain: Low Risk  (11/07/2018)   Overall Financial Resource Strain (CARDIA)    Difficulty of Paying Living Expenses: Not very hard  Food Insecurity: No Food Insecurity (11/07/2018)   Hunger Vital Sign    Worried About Running Out of Food in the Last Year: Never true    Ran Out of Food in the Last Year: Never true  Transportation Needs: No Transportation Needs (11/07/2018)   PRAPARE - Hydrologist  (Medical): No    Lack of Transportation (Non-Medical): No  Physical Activity: Unknown (11/07/2018)   Exercise Vital Sign    Days of Exercise per Week: Patient refused    Minutes of Exercise per Session: Patient refused  Stress: Unknown (11/07/2018)   Sneads    Feeling of Stress : Patient refused  Social Connections: Unknown (11/07/2018)   Social Connection and Isolation Panel [NHANES]    Frequency of Communication with Friends and Family: Patient refused    Frequency of Social Gatherings with Friends and Family: Patient refused    Attends Religious Services: Patient refused    Active Member of Clubs or Organizations: Patient refused    Attends Archivist Meetings: Patient refused    Marital Status: Patient refused  Intimate Partner Violence: Unknown (11/07/2018)   Humiliation,  Afraid, Rape, and Kick questionnaire    Fear of Current or Ex-Partner: Patient refused    Emotionally Abused: Patient refused    Physically Abused: Patient refused    Sexually Abused: Patient refused        Objective:    BP 114/72   Pulse 71   Temp (!) 97.2 F (36.2 C) (Temporal)   Ht $R'5\' 8"'YD$  (1.727 m)   Wt 138 lb 12.8 oz (63 kg)   SpO2 97%   BMI 21.10 kg/m   Wt Readings from Last 3 Encounters:  03/30/22 138 lb 12.8 oz (63 kg)  03/09/22 145 lb 3.2 oz (65.9 kg)  11/04/21 150 lb (68 kg)    Physical Exam Vitals reviewed.  Constitutional:      General: He is not in acute distress.    Appearance: Normal appearance. He is normal weight. He is not ill-appearing, toxic-appearing or diaphoretic.  HENT:     Head: Normocephalic and atraumatic.  Eyes:     General: No scleral icterus.       Right eye: No discharge.        Left eye: No discharge.     Conjunctiva/sclera: Conjunctivae normal.  Cardiovascular:     Rate and Rhythm: Normal rate.  Pulmonary:     Effort: Pulmonary effort is normal. No respiratory distress.   Musculoskeletal:        General: Normal range of motion.     Cervical back: Normal range of motion.  Skin:    General: Skin is warm and dry.  Neurological:     Mental Status: He is alert and oriented to person, place, and time. Mental status is at baseline.  Psychiatric:        Mood and Affect: Mood normal.        Behavior: Behavior normal.        Thought Content: Thought content normal.        Judgment: Judgment normal.     Lab Results  Component Value Date   TSH 0.078 (L) 09/29/2021   Lab Results  Component Value Date   WBC 10.7 09/29/2021   HGB 13.5 09/29/2021   HCT 39.9 09/29/2021   MCV 93 09/29/2021   PLT 220 09/29/2021   Lab Results  Component Value Date   NA 140 09/29/2021   K 4.5 09/29/2021   CO2 22 09/29/2021   GLUCOSE 81 09/29/2021   BUN 13 09/29/2021   CREATININE 1.00 09/29/2021   BILITOT 0.4 09/29/2021   ALKPHOS 125 (H) 09/29/2021   AST 13 09/29/2021   ALT 7 09/29/2021   PROT 6.7 09/29/2021   ALBUMIN 4.4 09/29/2021   CALCIUM 9.7 09/29/2021   ANIONGAP 9 09/16/2020   EGFR 86 09/29/2021   Lab Results  Component Value Date   CHOL 103 09/29/2021   Lab Results  Component Value Date   HDL 37 (L) 09/29/2021   Lab Results  Component Value Date   LDLCALC 51 09/29/2021   Lab Results  Component Value Date   TRIG 72 09/29/2021   Lab Results  Component Value Date   CHOLHDL 2.8 09/29/2021   Lab Results  Component Value Date   HGBA1C 4.9 12/10/2018

## 2022-03-31 ENCOUNTER — Other Ambulatory Visit: Payer: Self-pay | Admitting: Family Medicine

## 2022-03-31 DIAGNOSIS — E039 Hypothyroidism, unspecified: Secondary | ICD-10-CM

## 2022-03-31 LAB — T4, FREE: Free T4: 2.75 ng/dL — ABNORMAL HIGH (ref 0.82–1.77)

## 2022-03-31 LAB — TSH: TSH: 0.015 u[IU]/mL — ABNORMAL LOW (ref 0.450–4.500)

## 2022-03-31 MED ORDER — LEVOTHYROXINE SODIUM 125 MCG PO TABS: 125.0000 ug | ORAL_TABLET | Freq: Every day | ORAL | 2 refills | Status: DC

## 2022-04-12 ENCOUNTER — Ambulatory Visit: Payer: Medicare Other | Attending: Cardiovascular Disease | Admitting: Cardiovascular Disease

## 2022-04-12 ENCOUNTER — Encounter: Payer: Self-pay | Admitting: Cardiovascular Disease

## 2022-04-12 VITALS — BP 104/64 | HR 59 | Ht 68.0 in | Wt 140.8 lb

## 2022-04-12 DIAGNOSIS — E0580 Other thyrotoxicosis without thyrotoxic crisis or storm: Secondary | ICD-10-CM | POA: Diagnosis not present

## 2022-04-12 DIAGNOSIS — F4323 Adjustment disorder with mixed anxiety and depressed mood: Secondary | ICD-10-CM

## 2022-04-12 DIAGNOSIS — F17200 Nicotine dependence, unspecified, uncomplicated: Secondary | ICD-10-CM

## 2022-04-12 DIAGNOSIS — G4733 Obstructive sleep apnea (adult) (pediatric): Secondary | ICD-10-CM

## 2022-04-12 DIAGNOSIS — I2510 Atherosclerotic heart disease of native coronary artery without angina pectoris: Secondary | ICD-10-CM

## 2022-04-12 DIAGNOSIS — I700 Atherosclerosis of aorta: Secondary | ICD-10-CM | POA: Diagnosis not present

## 2022-04-12 DIAGNOSIS — I444 Left anterior fascicular block: Secondary | ICD-10-CM

## 2022-04-12 DIAGNOSIS — E7800 Pure hypercholesterolemia, unspecified: Secondary | ICD-10-CM | POA: Diagnosis not present

## 2022-04-12 DIAGNOSIS — I10 Essential (primary) hypertension: Secondary | ICD-10-CM

## 2022-04-12 MED ORDER — AMLODIPINE BESYLATE 2.5 MG PO TABS: 2.5000 mg | ORAL_TABLET | Freq: Every day | ORAL | 3 refills | Status: DC
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
## 2022-04-12 NOTE — Patient Instructions (Signed)
Medication Instructions:  DECREASE the Amlodipine to 2.5 mg once daily  *If you need a refill on your cardiac medications before your next appointment, please call your pharmacy*   Lab Work: None ordered If you have labs (blood work) drawn today and your tests are completely normal, you will receive your results only by: [place] (if you have MyChart) OR A paper copy in the mail If you have any lab test that is abnormal or we need to change your treatment, we will call you to review the results.   Testing/Procedures: None ordered   Follow-Up: At [facility], you and your health needs are our priority.  As part of our continuing mission to provide you with exceptional heart care, we have created designated Provider Care Teams.  These Care Teams include your primary Cardiologist (physician) and Advanced Practice Providers (APPs -  Physician Assistants and Nurse Practitioners) who all work together to provide you with the care you need, when you need it.  We recommend signing up for the patient portal called "MyChart".  Sign up information is provided on this After Visit Summary.  MyChart is used to connect with patients for Virtual Visits (Telemedicine).  Patients are able to view lab/test results, encounter notes, upcoming appointments, etc.  Non-urgent messages can be sent to your provider as well.   To learn more about what you can do with MyChart, go to [email or web address].    Your next appointment:   12 month(s)  The format for your next appointment:   In Person  Provider:   Sanda Klein, MD      Important Information About Sugar

## 2022-04-15 ENCOUNTER — Encounter: Payer: Self-pay | Admitting: Cardiovascular Disease

## 2022-04-15 DIAGNOSIS — I700 Atherosclerosis of aorta: Secondary | ICD-10-CM | POA: Insufficient documentation

## 2022-04-15 DIAGNOSIS — I444 Left anterior fascicular block: Secondary | ICD-10-CM | POA: Insufficient documentation

## 2022-05-26 DIAGNOSIS — T1501XA Foreign body in cornea, right eye, initial encounter: Secondary | ICD-10-CM | POA: Diagnosis not present

## 2022-07-15 ENCOUNTER — Other Ambulatory Visit: Payer: Self-pay | Admitting: Family Medicine

## 2022-07-15 DIAGNOSIS — E039 Hypothyroidism, unspecified: Secondary | ICD-10-CM

## 2022-07-27 ENCOUNTER — Encounter: Payer: Self-pay | Admitting: Family Medicine

## 2022-07-27 ENCOUNTER — Ambulatory Visit (INDEPENDENT_AMBULATORY_CARE_PROVIDER_SITE_OTHER): Payer: Medicare Other | Admitting: Family Medicine

## 2022-07-27 DIAGNOSIS — F3170 Bipolar disorder, currently in remission, most recent episode unspecified: Secondary | ICD-10-CM

## 2022-07-27 DIAGNOSIS — E782 Mixed hyperlipidemia: Secondary | ICD-10-CM | POA: Diagnosis not present

## 2022-07-27 DIAGNOSIS — I10 Essential (primary) hypertension: Secondary | ICD-10-CM

## 2022-07-27 DIAGNOSIS — E538 Deficiency of other specified B group vitamins: Secondary | ICD-10-CM | POA: Diagnosis not present

## 2022-07-27 DIAGNOSIS — E039 Hypothyroidism, unspecified: Secondary | ICD-10-CM

## 2022-07-27 DIAGNOSIS — F332 Major depressive disorder, recurrent severe without psychotic features: Secondary | ICD-10-CM

## 2022-07-27 MED ORDER — ATORVASTATIN CALCIUM 20 MG PO TABS: 20.0000 mg | ORAL_TABLET | Freq: Every day | ORAL | 1 refills | Status: DC

## 2022-07-27 NOTE — Progress Notes (Signed)
Virtual Visit via telephone Note Due to COVID-19 pandemic this visit was conducted virtually. This visit type was conducted due to national recommendations for restrictions regarding the COVID-19 Pandemic (e.g. social distancing, sheltering in place) in an effort to limit this patient's exposure and mitigate transmission in our community. All issues noted in this document were discussed and addressed.  A physical exam was not performed with this format.   I connected with [name] on 07/27/2022 at 1340 by telephone and verified that I am speaking with the correct person using two identifiers. [name] is currently located at home and patient is currently with them during visit. The provider, Monia Pouch, FNP is located in their office at time of visit.  I discussed the limitations, risks, security and privacy concerns of performing an evaluation and management service by virtual visit and the availability of in person appointments. I also discussed with the patient that there may be a patient responsible charge related to this service. The patient expressed understanding and agreed to proceed.  Subjective:  Patient ID: [name], male    DOB: [birth year], 61 y.o.   MRN: [id number]  Chief Complaint:  Medical Management of Chronic Issues   HPI: [name] is a 61 y.o. male presenting on 07/27/2022 for Medical Management of Chronic Issues   Pt following up for management of chronic medical conditions and to establish with new PCP. He states he has been doing very well. States he did have a "spell" of sickness around Thanksgiving but has recovered and is doing well.   1. Acquired hypothyroidism Last TSH was low and free T4 was high. Levothyroxine dose was decreased from 137 mcg to 125 mcg. He has been taking as prescribed. Denies hyper- or hypothyroid symptoms.   2. Essential hypertension Taking medications as prescribed without associated side effects. No chest  pain, leg swelling, headaches, visual changes, weakness, confusion, palpitations, or syncope.   3. Mixed hyperlipidemia On statin therapy and doing well. No reported myalgias. Does not follow a regular diet or exercise routine.   4. Vitamin B12 deficiency On once daily repletion therapy and tolerating well. Denies fatigue, weakness, skin color changes, or glossitis.   5. Bipolar affective disorder in remission ([place]) 6. Severe episode of recurrent major depressive disorder, without psychotic features ([place]) Followed by psychiatry on a regular basis and has an upcoming appointment next month. Denies SI or HI.     Relevant past medical, surgical, family, and social history reviewed and updated as indicated.  Allergies and medications reviewed and updated.   Past Medical History:  Diagnosis Date   Anxiety and depression    Arthritis    Headache    Hemorrhoids    Hyperlipidemia 02/06/2020   Hypertension    Hypothyroidism    OSA on CPAP    doesn't use often pt states    Pneumonia    Seizures ([place]) 02/2016   only with Benadryl use   Suicide and self-inflicted injury ([place])    Vitamin B12 deficiency 02/06/2020    Past Surgical History:  Procedure Laterality Date   ANTERIOR CERVICAL DECOMP/DISCECTOMY FUSION N/A 09/20/2020   Procedure: ANTERIOR CERVICAL DECOMPRESSION/DISCECTOMY FUSION CERVICAL THREE- CERVICAL FOUR;  Surgeon: Vallarie Mare, MD;  Location: [facility];  Service: Neurosurgery;  Laterality: N/A;   BICEPS TENDON REPAIR Right    CARPAL TUNNEL RELEASE Bilateral    DECORTICATION Left 11/12/2018   Procedure: DECORTICATION;  Surgeon: Ivin Poot, MD;  Location: [facility];  Service: Thoracic;  Laterality: Left;   KNEE CARTILAGE SURGERY Left    KNEE SURGERY Right    x 2   LAPAROTOMY N/A 11/03/2018   Procedure: EXPLORATORY LAPAROTOMY;  Surgeon: Erroll Luna, MD;  Location: [facility];  Service: General;  Laterality: N/A;   VIDEO ASSISTED THORACOSCOPY (VATS)/EMPYEMA Left 11/12/2018    Procedure: VIDEO ASSISTED THORACOSCOPY (VATS)/EMPYEMA, MINI THORACOTOMY;  Surgeon: Ivin Poot, MD;  Location: [facility];  Service: Thoracic;  Laterality: Left;  needs cell saver    Social History   Socioeconomic History   Marital status: Married    Spouse name: [name]   Number of children: 1   Years of education: 12   Highest education level: 12th grade  Occupational History   Occupation: Architect    Comment: remodels houses   Occupation: Clinical research associate     Comment: quit last week   Tobacco Use   Smoking status: Every Day    Packs/day: 1.50    Years: 51.00    Total pack years: 76.50    Types: Cigarettes    Start date: 1969   Smokeless tobacco: Never  Vaping Use   Vaping Use: Never used  Substance and Sexual Activity   Alcohol use: Not Currently    Comment: none since March 2020   Drug use: Not Currently    Frequency: 3.0 times per week    Types: Marijuana    Comment: 02/03/20- states no    Sexual activity: Not Currently    Partners: Female    Birth control/protection: None  Other Topics Concern   Not on file  Social History Narrative   ** Merged History Encounter **       Lives with wife Caffeine- coffee 2 cups daily   Social Determinants of Health   Financial Resource Strain: Low Risk  (11/07/2018)   Overall Financial Resource Strain (CARDIA)    Difficulty of Paying Living Expenses: Not very hard  Food Insecurity: No Food Insecurity (11/07/2018)   Hunger Vital Sign    Worried About Running Out of Food in the Last Year: Never true    Ran Out of Food in the Last Year: Never true  Transportation Needs: No Transportation Needs (11/07/2018)   PRAPARE - Hydrologist (Medical): No    Lack of Transportation (Non-Medical): No  Physical Activity: Unknown (11/07/2018)   Exercise Vital Sign    Days of Exercise per Week: Patient refused    Minutes of Exercise per Session: Patient refused  Stress: Unknown (11/07/2018)   [place]    Feeling of Stress : Patient refused  Social Connections: Unknown (11/07/2018)   Social Connection and Isolation Panel [NHANES]    Frequency of Communication with Friends and Family: Patient refused    Frequency of Social Gatherings with Friends and Family: Patient refused    Attends Religious Services: Patient refused    Active Member of Clubs or Organizations: Patient refused    Attends Archivist Meetings: Patient refused    Marital Status: Patient refused  Intimate Partner Violence: Unknown (11/07/2018)   Humiliation, Afraid, Rape, and Kick questionnaire    Fear of Current or Ex-Partner: Patient refused    Emotionally Abused: Patient refused    Physically Abused: Patient refused    Sexually Abused: Patient refused    Outpatient Encounter Medications as of 07/27/2022  Medication Sig   amLODipine (NORVASC) 2.5 MG tablet Take 1 tablet (2.5 mg total) by  mouth daily.   atorvastatin (LIPITOR) 20 MG tablet Take 1 tablet (20 mg total) by mouth daily.   citalopram (CELEXA) 20 MG tablet Take 20 mg by mouth daily.   cyanocobalamin (VITAMIN B12) 1000 MCG tablet Take 1,000 mcg by mouth daily.   levothyroxine (SYNTHROID) 125 MCG tablet Take 1 tablet (125 mcg total) by mouth daily before breakfast. (NEEDS LABWORK)   QUEtiapine (SEROQUEL XR) 200 MG 24 hr tablet Take 1 tablet (200 mg total) by mouth at bedtime. (Patient taking differently: Take 100 mg by mouth at bedtime.)   [DISCONTINUED] atorvastatin (LIPITOR) 20 MG tablet Take 1 tablet (20 mg total) by mouth daily.   Facility-Administered Encounter Medications as of 07/27/2022  Medication   gadopentetate dimeglumine (MAGNEVIST) injection 15 mL    Allergies  Allergen Reactions   Benadryl [Diphenhydramine] Other (See Comments)    "throws me for a loop". Seizure   Diphenhydramine Hcl Other (See Comments)    Review of Systems  Constitutional:  Negative for activity change,  appetite change, chills, diaphoresis, fatigue, fever and unexpected weight change.  HENT: Negative.  Negative for mouth sores.   Eyes: Negative.  Negative for photophobia and visual disturbance.  Respiratory:  Negative for cough, chest tightness and shortness of breath.   Cardiovascular:  Negative for chest pain, palpitations and leg swelling.  Gastrointestinal:  Negative for abdominal pain, blood in stool, constipation, diarrhea, nausea and vomiting.  Endocrine: Negative.  Negative for cold intolerance, heat intolerance, polydipsia, polyphagia and polyuria.  Genitourinary:  Negative for difficulty urinating, dysuria, frequency and urgency.  Musculoskeletal:  Negative for arthralgias and myalgias.  Skin: Negative.  Negative for color change, pallor, rash and wound.  Allergic/Immunologic: Negative.   Neurological:  Negative for dizziness and headaches.  Hematological: Negative.   Psychiatric/Behavioral:  Negative for agitation, confusion, hallucinations, sleep disturbance and suicidal ideas.   All other systems reviewed and are negative.        Observations/Objective: No vital signs or physical exam, this was a virtual health encounter.  Pt alert and oriented, answers all questions appropriately, and able to speak in full sentences.    Assessment and Plan: [name] was seen today for medical management of chronic issues.  Diagnoses and all orders for this visit:  Acquired hypothyroidism Labs pending. Will adjust regimen if warranted.  -     TSH; Future -     T3, Free; Future -     T4, Free; Future  Essential hypertension Labs pending. DASH diet and exercise encouraged. Monitor BP and report any high or low readings.  -     CBC with Differential/Platelet; Future -     CMP14+EGFR; Future -     Lipid panel; Future  Mixed hyperlipidemia Diet encouraged - increase intake of fresh fruits and vegetables, increase intake of lean proteins. Bake, broil, or grill foods. Avoid fried,  greasy, and fatty foods. Avoid fast foods. Increase intake of fiber-rich whole grains. Exercise encouraged - at least 150 minutes per week and advance as tolerated. Goal BMI < 25. Continue medications as prescribed. Follow up in 3-6 months as discussed.  -     CMP14+EGFR; Future -     Lipid panel; Future -     atorvastatin (LIPITOR) 20 MG tablet; Take 1 tablet (20 mg total) by mouth daily.  Vitamin B12 deficiency Will recheck levels and adjust repletion therapy if warranted.  -     CBC with Differential/Platelet; Future -     Vitamin B12; Future  Bipolar affective disorder  in remission [facility]) Severe episode of recurrent major depressive disorder, without psychotic features ([place]) Keep follow up with psychiatry as scheduled.     Follow Up Instructions: Return in about 3 months (around 10/26/2022), or if symptoms worsen or fail to improve, for chronic follow up.    I discussed the assessment and treatment plan with the patient. The patient was provided an opportunity to ask questions and all were answered. The patient agreed with the plan and demonstrated an understanding of the instructions.   The patient was advised to call back or seek an in-person evaluation if the symptoms worsen or if the condition fails to improve as anticipated.  The above assessment and management plan was discussed with the patient. The patient verbalized understanding of and has agreed to the management plan. Patient is aware to call the clinic if they develop any new symptoms or if symptoms persist or worsen. Patient is aware when to return to the clinic for a follow-up visit. Patient educated on when it is appropriate to go to the emergency department.    I provided 23 minutes of time during this telephone encounter.   Monia Pouch, FNP-C [facility] [address] [place], [place] [address] [phone] 07/27/2022

## 2022-08-04 ENCOUNTER — Other Ambulatory Visit: Payer: Medicare Other

## 2022-08-04 DIAGNOSIS — E782 Mixed hyperlipidemia: Secondary | ICD-10-CM | POA: Diagnosis not present

## 2022-08-04 DIAGNOSIS — E538 Deficiency of other specified B group vitamins: Secondary | ICD-10-CM | POA: Diagnosis not present

## 2022-08-04 DIAGNOSIS — E039 Hypothyroidism, unspecified: Secondary | ICD-10-CM

## 2022-08-04 DIAGNOSIS — I10 Essential (primary) hypertension: Secondary | ICD-10-CM

## 2022-08-05 LAB — CMP14+EGFR
ALT: 11 IU/L (ref 0–44)
AST: 11 IU/L (ref 0–40)
Albumin/Globulin Ratio: 2.2 (ref 1.2–2.2)
Albumin: 4.4 g/dL (ref 3.9–4.9)
Alkaline Phosphatase: 121 IU/L (ref 44–121)
BUN/Creatinine Ratio: 15 (ref 10–24)
BUN: 16 mg/dL (ref 8–27)
Bilirubin Total: 0.4 mg/dL (ref 0.0–1.2)
CO2: 24 mmol/L (ref 20–29)
Calcium: 9.4 mg/dL (ref 8.6–10.2)
Chloride: 97 mmol/L (ref 96–106)
Creatinine, Ser: 1.08 mg/dL (ref 0.76–1.27)
Globulin, Total: 2 g/dL (ref 1.5–4.5)
Glucose: 72 mg/dL (ref 70–99)
Potassium: 4.9 mmol/L (ref 3.5–5.2)
Sodium: 136 mmol/L (ref 134–144)
Total Protein: 6.4 g/dL (ref 6.0–8.5)
eGFR: 78 mL/min/{1.73_m2} (ref >59)

## 2022-08-05 LAB — LIPID PANEL
Chol/HDL Ratio: 2.9 ratio (ref 0.0–5.0)
Cholesterol, Total: 117 mg/dL (ref 100–199)
HDL: 41 mg/dL (ref >39)
LDL Chol Calc (NIH): 64 mg/dL (ref 0–99)
Triglycerides: 53 mg/dL (ref 0–149)
VLDL Cholesterol Cal: 12 mg/dL (ref 5–40)

## 2022-08-05 LAB — CBC WITH DIFFERENTIAL/PLATELET
Basophils Absolute: 0.1 10*3/uL (ref 0.0–0.2)
Basos: 1 %
EOS (ABSOLUTE): 0.1 10*3/uL (ref 0.0–0.4)
Eos: 1 %
Hematocrit: 36.6 % — ABNORMAL LOW (ref 37.5–51.0)
Hemoglobin: 12.2 g/dL — ABNORMAL LOW (ref 13.0–17.7)
Immature Grans (Abs): 0 10*3/uL (ref 0.0–0.1)
Immature Granulocytes: 0 %
Lymphocytes Absolute: 3.1 10*3/uL (ref 0.7–3.1)
Lymphs: 30 %
MCH: 30.8 pg (ref 26.6–33.0)
MCHC: 33.3 g/dL (ref 31.5–35.7)
MCV: 92 fL (ref 79–97)
Monocytes Absolute: 0.7 10*3/uL (ref 0.1–0.9)
Monocytes: 7 %
Neutrophils Absolute: 6.4 10*3/uL (ref 1.4–7.0)
Neutrophils: 61 %
Platelets: 268 10*3/uL (ref 150–450)
RBC: 3.96 x10E6/uL — ABNORMAL LOW (ref 4.14–5.80)
RDW: 14.5 % (ref 11.6–15.4)
WBC: 10.5 10*3/uL (ref 3.4–10.8)

## 2022-08-05 LAB — TSH: TSH: 15.9 u[IU]/mL — ABNORMAL HIGH (ref 0.450–4.500)

## 2022-08-05 LAB — T4, FREE: Free T4: 0.99 ng/dL (ref 0.82–1.77)

## 2022-08-05 LAB — VITAMIN B12: Vitamin B-12: 1402 pg/mL — ABNORMAL HIGH (ref 232–1245)

## 2022-08-05 LAB — T3, FREE: T3, Free: 1.7 pg/mL — ABNORMAL LOW (ref 2.0–4.4)

## 2022-08-07 HISTORY — PX: CATARACT EXTRACTION: SUR2

## 2022-08-07 MED ORDER — LEVOTHYROXINE SODIUM 150 MCG PO TABS: 150.0000 ug | ORAL_TABLET | Freq: Every day | ORAL | 3 refills | Status: DC

## 2022-08-25 ENCOUNTER — Other Ambulatory Visit: Payer: Self-pay | Admitting: Family Medicine

## 2022-08-25 DIAGNOSIS — E782 Mixed hyperlipidemia: Secondary | ICD-10-CM

## 2022-08-28 ENCOUNTER — Telehealth: Payer: Self-pay | Admitting: Family Medicine

## 2022-08-28 DIAGNOSIS — E782 Mixed hyperlipidemia: Secondary | ICD-10-CM

## 2022-08-28 MED ORDER — ATORVASTATIN CALCIUM 20 MG PO TABS: 20.0000 mg | ORAL_TABLET | Freq: Every day | ORAL | 1 refills | Status: DC

## 2022-08-28 NOTE — Telephone Encounter (Signed)
Patient needed a refill on atorvastatin. Rx sent to pharmacy for patient.

## 2022-10-05 ENCOUNTER — Other Ambulatory Visit: Payer: Self-pay | Admitting: Cardiovascular Disease

## 2022-10-05 ENCOUNTER — Encounter: Payer: Self-pay | Admitting: Radiology

## 2022-10-05 DIAGNOSIS — I10 Essential (primary) hypertension: Secondary | ICD-10-CM

## 2023-01-23 ENCOUNTER — Other Ambulatory Visit: Payer: Self-pay | Admitting: Cardiovascular Disease

## 2023-01-23 DIAGNOSIS — I10 Essential (primary) hypertension: Secondary | ICD-10-CM

## 2023-01-26 ENCOUNTER — Other Ambulatory Visit: Payer: Self-pay | Admitting: Family Medicine

## 2023-01-26 DIAGNOSIS — E039 Hypothyroidism, unspecified: Secondary | ICD-10-CM

## 2023-02-27 ENCOUNTER — Other Ambulatory Visit: Payer: Self-pay | Admitting: Family Medicine

## 2023-02-27 DIAGNOSIS — E782 Mixed hyperlipidemia: Secondary | ICD-10-CM

## 2023-02-27 NOTE — Telephone Encounter (Signed)
Made appt for 7/26

## 2023-02-27 NOTE — Telephone Encounter (Signed)
Rakes NTBS for 6 mos FU from Dec NO refill sent to mail order pharmacy

## 2023-03-02 ENCOUNTER — Encounter: Payer: Self-pay | Admitting: Family Medicine

## 2023-03-02 ENCOUNTER — Ambulatory Visit: Payer: Medicare Other | Admitting: Family Medicine

## 2023-03-02 VITALS — BP 117/76 | HR 55 | Temp 97.7°F | Ht 68.0 in | Wt 132.8 lb

## 2023-03-02 DIAGNOSIS — E039 Hypothyroidism, unspecified: Secondary | ICD-10-CM

## 2023-03-02 DIAGNOSIS — I10 Essential (primary) hypertension: Secondary | ICD-10-CM

## 2023-03-02 DIAGNOSIS — I700 Atherosclerosis of aorta: Secondary | ICD-10-CM

## 2023-03-02 DIAGNOSIS — E782 Mixed hyperlipidemia: Secondary | ICD-10-CM | POA: Diagnosis not present

## 2023-03-02 DIAGNOSIS — F319 Bipolar disorder, unspecified: Secondary | ICD-10-CM

## 2023-03-02 LAB — CBC WITH DIFFERENTIAL/PLATELET
Basophils Absolute: 0.1 10*3/uL (ref 0.0–0.2)
Basos: 1 %
EOS (ABSOLUTE): 0.2 10*3/uL (ref 0.0–0.4)
Eos: 2 %
Hematocrit: 37.8 % (ref 37.5–51.0)
Hemoglobin: 13 g/dL (ref 13.0–17.7)
Immature Grans (Abs): 0 10*3/uL (ref 0.0–0.1)
Immature Granulocytes: 0 %
Lymphocytes Absolute: 3 10*3/uL (ref 0.7–3.1)
Lymphs: 33 %
MCH: 31.3 pg (ref 26.6–33.0)
MCHC: 34.4 g/dL (ref 31.5–35.7)
MCV: 91 fL (ref 79–97)
Monocytes Absolute: 0.5 10*3/uL (ref 0.1–0.9)
Monocytes: 6 %
Neutrophils Absolute: 5.3 10*3/uL (ref 1.4–7.0)
Neutrophils: 58 %
Platelets: 240 10*3/uL (ref 150–450)
RBC: 4.16 x10E6/uL (ref 4.14–5.80)
RDW: 12.9 % (ref 11.6–15.4)
WBC: 9.2 10*3/uL (ref 3.4–10.8)

## 2023-03-02 LAB — CMP14+EGFR

## 2023-03-02 LAB — LIPID PANEL

## 2023-03-02 LAB — THYROID PANEL WITH TSH

## 2023-03-02 MED ORDER — ATORVASTATIN CALCIUM 20 MG PO TABS: 20.0000 mg | ORAL_TABLET | Freq: Every day | ORAL | 1 refills | Status: DC

## 2023-03-02 NOTE — Progress Notes (Signed)
Subjective:  Patient ID: [name], male    DOB: [birth year], 62 y.o.   MRN: [id number]  Patient Care Team: Sonny Masters, FNP as PCP - General (Family Medicine) Thurmon Fair, MD as PCP - Cardiology (Cardiology) [facility]and[facility])   Chief Complaint:  Medical Management of Chronic Issues   HPI: [name] is a 62 y.o. male presenting on 03/02/2023 for Medical Management of Chronic Issues   1. Mixed hyperlipidemia Compliant with medications - Yes Current medications - atorvastatin Side effects from medications - No Diet - regular Exercise - not regular but active daily  2. Essential hypertension Complaint with meds - Yes Current Medications - amlodipine  Checking BP at home - no Exercising Regularly - No Watching Salt intake - No Pertinent ROS:  Headache - No Fatigue - No Visual Disturbances - No Chest pain - No Dyspnea - No Palpitations - No LE edema - No They report good compliance with medications and can restate their regimen by memory. No medication side effects.  BP Readings from Last 3 Encounters:  03/02/23 117/76  04/12/22 104/64  03/30/22 114/72     3. Acquired hypothyroidism Compliant with medications - Yes Current medications - levothyroxine 125 mcg daily Adverse side effects - No Weight - stable  Bowel habit changes - No Heat or cold intolerance - No Mood changes - Yes Changes in sleep habits - No Fatigue - No Skin, hair, or nail changes - No Tremor - No Palpitations - No Edema - No Shortness of breath - No  4. Bipolar affective disorder, remission status unspecified (HCC) He is followed by psychiatry on a regular basis. He is compliant with medications. He does report increased stress over the last few weeks as his step-daughter and her two children have moved into his home. He states he is handling this as well as he can.     03/02/2023    9:10 AM 03/30/2022    3:17 PM 03/09/2022    3:04 PM 09/29/2021     3:07 PM 06/29/2021   10:26 AM  Depression screen PHQ 2/9  Decreased Interest 3 2 3 3 3   Down, Depressed, Hopeless 0 1 2 2  0  PHQ - 2 Score 3 3 5 5 3   Altered sleeping 0 1 3 2 2   Tired, decreased energy 2 1 3 3 2   Change in appetite 3 1 2 3 2   Feeling bad or failure about yourself  0 0 0 0 0  Trouble concentrating 0 0 0 0 0  Moving slowly or fidgety/restless 0 0 0 0 1  Suicidal thoughts 0 0 0 0 0  PHQ-9 Score 8 6 13 13 10   Difficult doing work/chores Very difficult Very difficult Somewhat difficult Somewhat difficult Somewhat difficult      03/02/2023    9:10 AM 03/30/2022    3:18 PM 03/09/2022    3:04 PM 09/29/2021    3:07 PM  GAD 7 : Generalized Anxiety Score  Nervous, Anxious, on Edge 2 3 3 2   Control/stop worrying 2 2 0 3  Worry too much - different things 2 2 1 2   Trouble relaxing 2 0 0 1  Restless 0 0 0 0  Easily annoyed or irritable 3 2 2 3   Afraid - awful might happen 2 0 0 0  Total GAD 7 Score 13 9 6 11   Anxiety Difficulty Very difficult Somewhat difficult Somewhat difficult Somewhat difficult  5. Atherosclerosis of aorta (HCC) He is on statin therapy. Advised to take ASA 81 mg daily. Denies any anginal symptoms.      Relevant past medical, surgical, family, and social history reviewed and updated as indicated.  Allergies and medications reviewed and updated. Data reviewed: Chart in Epic.   Past Medical History:  Diagnosis Date   Anxiety and depression    Arthritis    Headache    Hemorrhoids    Hyperlipidemia 02/06/2020   Hypertension    Hypothyroidism    OSA on CPAP    doesn't use often pt states    Pneumonia    Seizures (HCC) 02/2016   only with Benadryl use   Suicide and self-inflicted injury (HCC)    Vitamin B12 deficiency 02/06/2020    Past Surgical History:  Procedure Laterality Date   ANTERIOR CERVICAL DECOMP/DISCECTOMY FUSION N/A 09/20/2020   Procedure: ANTERIOR CERVICAL DECOMPRESSION/DISCECTOMY FUSION CERVICAL THREE- CERVICAL FOUR;   Surgeon: Bedelia Person, MD;  Location: [facility] OR;  Service: Neurosurgery;  Laterality: N/A;   BICEPS TENDON REPAIR Right    CARPAL TUNNEL RELEASE Bilateral    DECORTICATION Left 11/12/2018   Procedure: DECORTICATION;  Surgeon: Donata Clay, Theron Arista, MD;  Location: [facility] OR;  Service: Thoracic;  Laterality: Left;   KNEE CARTILAGE SURGERY Left    KNEE SURGERY Right    x 2   LAPAROTOMY N/A 11/03/2018   Procedure: EXPLORATORY LAPAROTOMY;  Surgeon: Harriette Bouillon, MD;  Location: [facility] OR;  Service: General;  Laterality: N/A;   VIDEO ASSISTED THORACOSCOPY (VATS)/EMPYEMA Left 11/12/2018   Procedure: VIDEO ASSISTED THORACOSCOPY (VATS)/EMPYEMA, MINI THORACOTOMY;  Surgeon: Donata Clay, Theron Arista, MD;  Location: [facility] OR;  Service: Thoracic;  Laterality: Left;  needs cell saver    Social History   Socioeconomic History   Marital status: Married    Spouse name: Lupita Leash   Number of children: 1   Years of education: 12   Highest education level: 12th grade  Occupational History   Occupation: Holiday representative    Comment: remodels houses   Occupation: Psychologist, 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   Tobacco Use   Smoking status: Every Day    Current packs/day: 1.50    Average packs/day: 1.5 packs/day for 55.6 years (83.3 ttl pk-yrs)    Types: Cigarettes    Start date: 1969   Smokeless tobacco: Never  Vaping Use   Vaping status: Never Used  Substance and Sexual Activity   Alcohol use: Not Currently    Comment: none since March 2020   Drug use: Not Currently    Frequency: 3.0 times per week    Types: Marijuana    Comment: 02/03/20- states no    Sexual activity: Not Currently    Partners: Female    Birth control/protection: None  Other Topics Concern   Not on file  Social History Narrative   ** Merged History Encounter **       Lives with wife Caffeine- coffee 2 cups daily   Social Determinants of Health   Financial Resource Strain: Low Risk  (11/07/2018)   Overall Financial Resource Strain (CARDIA)     Difficulty of Paying Living Expenses: Not very hard  Food Insecurity: No Food Insecurity (11/07/2018)   Hunger Vital Sign    Worried About Running Out of Food in the Last Year: Never true    Ran Out of Food in the Last Year: Never true  Transportation Needs: No Transportation Needs (11/07/2018)   PRAPARE - Transportation  Lack of Transportation (Medical): No    Lack of Transportation (Non-Medical): No  Physical Activity: Unknown (11/07/2018)   Exercise Vital Sign    Days of Exercise per Week: Patient declined    Minutes of Exercise per Session: Patient declined  Stress: Unknown (11/07/2018)   [employer] of Occupational Health - Occupational Stress Questionnaire    Feeling of Stress : Patient declined  Social Connections: Unknown (11/07/2018)   Social Connection and Isolation Panel [NHANES]    Frequency of Communication with Friends and Family: Patient declined    Frequency of Social Gatherings with Friends and Family: Patient declined    Attends Religious Services: Patient declined    Database administrator or Organizations: Patient declined    Attends Banker Meetings: Patient declined    Marital Status: Patient declined  Intimate Partner Violence: Unknown (11/07/2018)   Humiliation, Afraid, Rape, and Kick questionnaire    Fear of Current or Ex-Partner: Patient declined    Emotionally Abused: Patient declined    Physically Abused: Patient declined    Sexually Abused: Patient declined    Outpatient Encounter Medications as of 03/02/2023  Medication Sig   amLODipine (NORVASC) 2.5 MG tablet TAKE 1 TABLET BY MOUTH DAILY   citalopram (CELEXA) 20 MG tablet Take 20 mg by mouth daily.   cyanocobalamin (VITAMIN B12) 1000 MCG tablet Take 1,000 mcg by mouth daily.   hydrOXYzine (VISTARIL) 50 MG capsule Take 50 mg by mouth in the morning, at noon, and at bedtime.   levothyroxine (SYNTHROID) 125 MCG tablet Take 125 mcg by mouth daily before breakfast.   [DISCONTINUED] atorvastatin  (LIPITOR) 20 MG tablet Take 1 tablet (20 mg total) by mouth daily.   [DISCONTINUED] levothyroxine (SYNTHROID) 150 MCG tablet TAKE 1 TABLET BY MOUTH DAILY  BEFORE BREAKFAST   [DISCONTINUED] levothyroxine (SYNTHROID) 150 MCG tablet Take 150 mcg by mouth daily before breakfast. 0.125 mcg daily   atorvastatin (LIPITOR) 20 MG tablet Take 1 tablet (20 mg total) by mouth daily.   QUEtiapine (SEROQUEL XR) 200 MG 24 hr tablet Take 1 tablet (200 mg total) by mouth at bedtime. (Patient taking differently: Take 100 mg by mouth at bedtime.)   Facility-Administered Encounter Medications as of 03/02/2023  Medication   gadopentetate dimeglumine (MAGNEVIST) injection 15 mL    Allergies  Allergen Reactions   Benadryl [Diphenhydramine] Other (See Comments)    "throws me for a loop". Seizure   Diphenhydramine Hcl Other (See Comments)    Review of Systems  Constitutional:  Positive for activity change, appetite change and fatigue. Negative for chills, diaphoresis, fever and unexpected weight change.  Eyes:  Negative for photophobia and visual disturbance.  Respiratory:  Negative for cough and shortness of breath.   Cardiovascular:  Negative for chest pain, palpitations and leg swelling.  Endocrine: Negative for polydipsia, polyphagia and polyuria.  Genitourinary:  Negative for decreased urine volume and difficulty urinating.  Neurological:  Negative for dizziness, tremors, seizures, syncope, facial asymmetry, speech difficulty, weakness, light-headedness, numbness and headaches.  Psychiatric/Behavioral:  Positive for agitation, decreased concentration, dysphoric mood and sleep disturbance. Negative for behavioral problems, confusion, hallucinations, self-injury and suicidal ideas. The patient is nervous/anxious. The patient is not hyperactive.   All other systems reviewed and are negative.       Objective:  BP 117/76   Pulse (!) 55   Temp 97.7 F (36.5 C) (Temporal)   Ht 5\' 8"  (1.727 m)   Wt 132 lb  12.8 oz (60.2 kg)   SpO2 100%  BMI 20.19 kg/m    Wt Readings from Last 3 Encounters:  03/02/23 132 lb 12.8 oz (60.2 kg)  04/12/22 140 lb 12.8 oz (63.9 kg)  03/30/22 138 lb 12.8 oz (63 kg)    Physical Exam Vitals and nursing note reviewed.  Constitutional:      General: He is not in acute distress.    Appearance: Normal appearance. He is well-developed, well-groomed and normal weight. He is not ill-appearing, toxic-appearing or diaphoretic.  HENT:     Head: Normocephalic and atraumatic.     Jaw: There is normal jaw occlusion.     Right Ear: Hearing, tympanic membrane, ear canal and external ear normal.     Left Ear: Hearing, tympanic membrane, ear canal and external ear normal.     Nose: Nose normal.     Mouth/Throat:     Lips: Pink.     Mouth: Mucous membranes are moist.     Dentition: Has dentures (upper and lower).     Pharynx: Oropharynx is clear. Uvula midline.  Eyes:     General: Lids are normal.     Extraocular Movements: Extraocular movements intact.     Conjunctiva/sclera: Conjunctivae normal.     Pupils: Pupils are equal, round, and reactive to light.  Neck:     Thyroid: No thyroid mass, thyromegaly or thyroid tenderness.     Vascular: No carotid bruit or JVD.     Trachea: Trachea and phonation normal.  Cardiovascular:     Rate and Rhythm: Regular rhythm. Bradycardia present.     Chest Wall: PMI is not displaced.     Pulses: Normal pulses.     Heart sounds: Normal heart sounds. No murmur heard.    No friction rub. No gallop.  Pulmonary:     Effort: Pulmonary effort is normal. No respiratory distress.     Breath sounds: Normal breath sounds. No wheezing.  Abdominal:     General: Bowel sounds are normal. There is no distension or abdominal bruit.     Palpations: Abdomen is soft. There is no hepatomegaly or splenomegaly.     Tenderness: There is no abdominal tenderness. There is no right CVA tenderness or left CVA tenderness.     Hernia: No hernia is present.   Musculoskeletal:        General: Normal range of motion.     Cervical back: Normal range of motion and neck supple.     Right lower leg: No edema.     Left lower leg: No edema.  Lymphadenopathy:     Cervical: No cervical adenopathy.  Skin:    General: Skin is warm and dry.     Capillary Refill: Capillary refill takes less than 2 seconds.     Coloration: Skin is not cyanotic, jaundiced or pale.     Findings: No rash.  Neurological:     General: No focal deficit present.     Mental Status: He is alert and oriented to person, place, and time.     Sensory: Sensation is intact.     Motor: Motor function is intact.     Coordination: Coordination is intact.     Gait: Gait is intact.     Deep Tendon Reflexes: Reflexes are normal and symmetric.  Psychiatric:        Attention and Perception: Attention and perception normal.        Mood and Affect: Mood and affect normal.        Speech: Speech normal.  Behavior: Behavior normal. Behavior is cooperative.        Thought Content: Thought content normal.        Cognition and Memory: Cognition and memory normal.        Judgment: Judgment normal.     Results for orders placed or performed in visit on 08/04/22  CBC with Differential/Platelet  Result Value Ref Range   WBC 10.5 3.4 - 10.8 x10E3/uL   RBC 3.96 (L) 4.14 - 5.80 x10E6/uL   Hemoglobin 12.2 (L) 13.0 - 17.7 g/dL   Hematocrit 16.1 (L) 09.6 - 51.0 %   MCV 92 79 - 97 fL   MCH 30.8 26.6 - 33.0 pg   MCHC 33.3 31.5 - 35.7 g/dL   RDW 04.5 40.9 - 81.1 %   Platelets 268 150 - 450 x10E3/uL   Neutrophils 61 Not Estab. %   Lymphs 30 Not Estab. %   Monocytes 7 Not Estab. %   Eos 1 Not Estab. %   Basos 1 Not Estab. %   Neutrophils Absolute 6.4 1.4 - 7.0 x10E3/uL   Lymphocytes Absolute 3.1 0.7 - 3.1 x10E3/uL   Monocytes Absolute 0.7 0.1 - 0.9 x10E3/uL   EOS (ABSOLUTE) 0.1 0.0 - 0.4 x10E3/uL   Basophils Absolute 0.1 0.0 - 0.2 x10E3/uL   Immature Granulocytes 0 Not Estab. %    Immature Grans (Abs) 0.0 0.0 - 0.1 x10E3/uL  CMP14+EGFR  Result Value Ref Range   Glucose 72 70 - 99 mg/dL   BUN 16 8 - 27 mg/dL   Creatinine, Ser 9.14 0.76 - 1.27 mg/dL   eGFR 78 >78 GN/FAO/1.30   BUN/Creatinine Ratio 15 10 - 24   Sodium 136 134 - 144 mmol/L   Potassium 4.9 3.5 - 5.2 mmol/L   Chloride 97 96 - 106 mmol/L   CO2 24 20 - 29 mmol/L   Calcium 9.4 8.6 - 10.2 mg/dL   Total Protein 6.4 6.0 - 8.5 g/dL   Albumin 4.4 3.9 - 4.9 g/dL   Globulin, Total 2.0 1.5 - 4.5 g/dL   Albumin/Globulin Ratio 2.2 1.2 - 2.2   Bilirubin Total 0.4 0.0 - 1.2 mg/dL   Alkaline Phosphatase 121 44 - 121 IU/L   AST 11 0 - 40 IU/L   ALT 11 0 - 44 IU/L  Lipid panel  Result Value Ref Range   Cholesterol, Total 117 100 - 199 mg/dL   Triglycerides 53 0 - 149 mg/dL   HDL 41 >86 mg/dL   VLDL Cholesterol Cal 12 5 - 40 mg/dL   LDL Chol Calc (NIH) 64 0 - 99 mg/dL   Chol/HDL Ratio 2.9 0.0 - 5.0 ratio  Vitamin B12  Result Value Ref Range   Vitamin B-12 1,402 (H) 232 - 1,245 pg/mL  TSH  Result Value Ref Range   TSH 15.900 (H) 0.450 - 4.500 uIU/mL  T3, Free  Result Value Ref Range   T3, Free 1.7 (L) 2.0 - 4.4 pg/mL  T4, Free  Result Value Ref Range   Free T4 0.99 0.82 - 1.77 ng/dL       Pertinent labs & imaging results that were available during my care of the patient were reviewed by me and considered in my medical decision making.  Assessment & Plan:  [name] was seen today for medical management of chronic issues.  Diagnoses and all orders for this visit:  Essential hypertension BP well controlled. Changes were not made in regimen today. Goal BP is 130/80. Pt aware to report any persistent  high or low readings. DASH diet and exercise encouraged. Exercise at least 150 minutes per week and increase as tolerated. Goal BMI > 25. Stress management encouraged. Avoid nicotine and tobacco product use. Avoid excessive alcohol and NSAID's. Avoid more than 2000 mg of sodium daily. Medications as prescribed.  Follow up as scheduled.  -     CBC with Differential/Platelet -     CMP14+EGFR -     Lipid panel -     Thyroid Panel With TSH  Mixed hyperlipidemia Diet encouraged - increase intake of fresh fruits and vegetables, increase intake of lean proteins. Bake, broil, or grill foods. Avoid fried, greasy, and fatty foods. Avoid fast foods. Increase intake of fiber-rich whole grains. Exercise encouraged - at least 150 minutes per week and advance as tolerated.  Goal BMI < 25. Continue medications as prescribed. Follow up in 3-6 months as discussed.  -     atorvastatin (LIPITOR) 20 MG tablet; Take 1 tablet (20 mg total) by mouth daily. -     CMP14+EGFR -     Lipid panel  Acquired hypothyroidism Thyroid disease has been fairly controlled. Labs are pending. Adjustments to regimen will be made if warranted. Make sure to take medications on an empty stomach with a full glass of water. Make sure to avoid vitamins or supplements for at least 4 hours before and 4 hours after taking medications. Repeat labs in 3 months if adjustments are made and in 6 months if stable.   -     Thyroid Panel With TSH  Bipolar affective disorder, remission status unspecified (HCC) Followed by psychiatry on a regular basis.  -     Thyroid Panel With TSH  Atherosclerosis of aorta (HCC) On statin therapy, discussed daily low dose ASA. Followed by cardiology in a regular basis.  -     atorvastatin (LIPITOR) 20 MG tablet; Take 1 tablet (20 mg total) by mouth daily. -     Lipid panel     Continue all other maintenance medications.  Follow up plan: Return in about 6 months (around 09/02/2023) for CPE.   Continue healthy lifestyle choices, including diet (rich in fruits, vegetables, and lean proteins, and low in salt and simple carbohydrates) and exercise (at least 30 minutes of moderate physical activity daily).  Educational handout given for health maintenance   The above assessment and management plan was discussed with  the patient. The patient verbalized understanding of and has agreed to the management plan. Patient is aware to call the clinic if they develop any new symptoms or if symptoms persist or worsen. Patient is aware when to return to the clinic for a follow-up visit. Patient educated on when it is appropriate to go to the emergency department.   Kari Baars, FNP-C [facility] [phone]

## 2023-03-05 MED ORDER — LEVOTHYROXINE SODIUM 150 MCG PO TABS
150.0000 ug | ORAL_TABLET | Freq: Every day | ORAL | 3 refills | Status: DC
Start: 2023-03-05 — End: 2024-03-12

## 2023-03-05 NOTE — Addendum Note (Signed)
Addended by: Sonny Masters on: 03/05/2023 08:10 PM   Modules accepted: Orders

## 2023-03-16 ENCOUNTER — Ambulatory Visit: Payer: Medicare Other

## 2023-03-16 VITALS — Ht 68.0 in | Wt 133.0 lb

## 2023-03-16 DIAGNOSIS — Z0000 Encounter for general adult medical examination without abnormal findings: Secondary | ICD-10-CM

## 2023-03-16 DIAGNOSIS — Z122 Encounter for screening for malignant neoplasm of respiratory organs: Secondary | ICD-10-CM

## 2023-03-16 DIAGNOSIS — Z1211 Encounter for screening for malignant neoplasm of colon: Secondary | ICD-10-CM

## 2023-03-16 NOTE — Patient Instructions (Signed)
[name] , Thank you for taking time to come for your Medicare Wellness Visit. I appreciate your ongoing commitment to your health goals. Please review the following plan we discussed and let me know if I can assist you in the future.   Referrals/Orders/Follow-Ups/Clinician Recommendations: Aim for 30 minutes of exercise or brisk walking, 6-8 glasses of water, and 5 servings of fruits and vegetables each day.   This is a list of the screening recommended for you and due dates:  Health Maintenance  Topic Date Due   DTaP/Tdap/Td vaccine (2 - Td or Tdap) 02/11/2019   Screening for Lung Cancer  11/27/2019   Colon Cancer Screening  03/17/2021   Flu Shot  03/08/2023   COVID-19 Vaccine (2 - 2023-24 season) 03/18/2023*   Zoster (Shingles) Vaccine (1 of 2) 06/02/2023*   Medicare Annual Wellness Visit  03/15/2024   Hepatitis C Screening  Completed   HIV Screening  Completed   HPV Vaccine  Aged Out  *Topic was postponed. The date shown is not the original due date.    Advanced directives: (Copy Requested) Please bring a copy of your health care power of attorney and living will to the office to be added to your chart at your convenience.  Next Medicare Annual Wellness Visit scheduled for next year: Yes  Preventive Care 40-64 Years, Male Preventive care refers to lifestyle choices and visits with your health care provider that can promote health and wellness. What does preventive care include? A yearly physical exam. This is also called an annual well check. Dental exams once or twice a year. Routine eye exams. Ask your health care provider how often you should have your eyes checked. Personal lifestyle choices, including: Daily care of your teeth and gums. Regular physical activity. Eating a healthy diet. Avoiding tobacco and drug use. Limiting alcohol use. Practicing safe sex. Taking low-dose aspirin every day starting at age 32. What happens during an annual well check? The services  and screenings done by your health care provider during your annual well check will depend on your age, overall health, lifestyle risk factors, and family history of disease. Counseling  Your health care provider may ask you questions about your: Alcohol use. Tobacco use. Drug use. Emotional well-being. Home and relationship well-being. Sexual activity. Eating habits. Work and work Astronomer. Screening  You may have the following tests or measurements: Height, weight, and BMI. Blood pressure. Lipid and cholesterol levels. These may be checked every 5 years, or more frequently if you are over 17 years old. Skin check. Lung cancer screening. You may have this screening every year starting at age 110 if you have a 30-pack-year history of smoking and currently smoke or have quit within the past 15 years. Fecal occult blood test (FOBT) of the stool. You may have this test every year starting at age 12. Flexible sigmoidoscopy or colonoscopy. You may have a sigmoidoscopy every 5 years or a colonoscopy every 10 years starting at age 6. Prostate cancer screening. Recommendations will vary depending on your family history and other risks. Hepatitis C blood test. Hepatitis B blood test. Sexually transmitted disease (STD) testing. Diabetes screening. This is done by checking your blood sugar (glucose) after you have not eaten for a while (fasting). You may have this done every 1-3 years. Discuss your test results, treatment options, and if necessary, the need for more tests with your health care provider. Vaccines  Your health care provider may recommend certain vaccines, such as: Influenza vaccine. This  is recommended every year. Tetanus, diphtheria, and acellular pertussis (Tdap, Td) vaccine. You may need a Td booster every 10 years. Zoster vaccine. You may need this after age 29. Pneumococcal 13-valent conjugate (PCV13) vaccine. You may need this if you have certain conditions and have not  been vaccinated. Pneumococcal polysaccharide (PPSV23) vaccine. You may need one or two doses if you smoke cigarettes or if you have certain conditions. Talk to your health care provider about which screenings and vaccines you need and how often you need them. This information is not intended to replace advice given to you by your health care provider. Make sure you discuss any questions you have with your health care provider. Document Released: 08/20/2015 Document Revised: 04/12/2016 Document Reviewed: 05/25/2015 Elsevier Interactive Patient Education  2017 [employer].  Fall Prevention in the Home Falls can cause injuries. They can happen to people of all ages. There are many things you can do to make your home safe and to help prevent falls. What can I do on the outside of my home? Regularly fix the edges of walkways and driveways and fix any cracks. Remove anything that might make you trip as you walk through a door, such as a raised step or threshold. Trim any bushes or trees on the path to your home. Use bright outdoor lighting. Clear any walking paths of anything that might make someone trip, such as rocks or tools. Regularly check to see if handrails are loose or broken. Make sure that both sides of any steps have handrails. Any raised decks and porches should have guardrails on the edges. Have any leaves, snow, or ice cleared regularly. Use sand or salt on walking paths during winter. Clean up any spills in your garage right away. This includes oil or grease spills. What can I do in the bathroom? Use night lights. Install grab bars by the toilet and in the tub and shower. Do not use towel bars as grab bars. Use non-skid mats or decals in the tub or shower. If you need to sit down in the shower, use a plastic, non-slip stool. Keep the floor dry. Clean up any water that spills on the floor as soon as it happens. Remove soap buildup in the tub or shower regularly. Attach bath mats  securely with double-sided non-slip rug tape. Do not have throw rugs and other things on the floor that can make you trip. What can I do in the bedroom? Use night lights. Make sure that you have a light by your bed that is easy to reach. Do not use any sheets or blankets that are too big for your bed. They should not hang down onto the floor. Have a firm chair that has side arms. You can use this for support while you get dressed. Do not have throw rugs and other things on the floor that can make you trip. What can I do in the kitchen? Clean up any spills right away. Avoid walking on wet floors. Keep items that you use a lot in easy-to-reach places. If you need to reach something above you, use a strong step stool that has a grab bar. Keep electrical cords out of the way. Do not use floor polish or wax that makes floors slippery. If you must use wax, use non-skid floor wax. Do not have throw rugs and other things on the floor that can make you trip. What can I do with my stairs? Do not leave any items on the stairs. Make  sure that there are handrails on both sides of the stairs and use them. Fix handrails that are broken or loose. Make sure that handrails are as long as the stairways. Check any carpeting to make sure that it is firmly attached to the stairs. Fix any carpet that is loose or worn. Avoid having throw rugs at the top or bottom of the stairs. If you do have throw rugs, attach them to the floor with carpet tape. Make sure that you have a light switch at the top of the stairs and the bottom of the stairs. If you do not have them, ask someone to add them for you. What else can I do to help prevent falls? Wear shoes that: Do not have high heels. Have rubber bottoms. Are comfortable and fit you well. Are closed at the toe. Do not wear sandals. If you use a stepladder: Make sure that it is fully opened. Do not climb a closed stepladder. Make sure that both sides of the stepladder  are locked into place. Ask someone to hold it for you, if possible. Clearly mark and make sure that you can see: Any grab bars or handrails. First and last steps. Where the edge of each step is. Use tools that help you move around (mobility aids) if they are needed. These include: Canes. Walkers. Scooters. Crutches. Turn on the lights when you go into a dark area. Replace any light bulbs as soon as they burn out. Set up your furniture so you have a clear path. Avoid moving your furniture around. If any of your floors are uneven, fix them. If there are any pets around you, be aware of where they are. Review your medicines with your doctor. Some medicines can make you feel dizzy. This can increase your chance of falling. Ask your doctor what other things that you can do to help prevent falls. This information is not intended to replace advice given to you by your health care provider. Make sure you discuss any questions you have with your health care provider. Document Released: 05/20/2009 Document Revised: 12/30/2015 Document Reviewed: 08/28/2014 Elsevier Interactive Patient Education  2017 [employer].

## 2023-03-16 NOTE — Progress Notes (Signed)
Subjective:   [name] is a 62 y.o. male who presents for Medicare Initial  preventive examination.  Visit Complete: Virtual  I connected with  [name] on 03/16/23 by a audio enabled telemedicine application and verified that I am speaking with the correct person using two identifiers.  Patient Location: Home  Provider Location: Home Office  I discussed the limitations of evaluation and management by telemedicine. The patient expressed understanding and agreed to proceed.  Patient Medicare AWV questionnaire was completed by the patient on 03/16/2023; I have confirmed that all information answered by patient is correct and no changes since this date.  Review of Systems    Vital Signs: Unable to obtain new vitals due to this being a telehealth visit.  Cardiac Risk Factors include: advanced age (>75men, >7 women);dyslipidemia;male gender;hypertension;smoking/ tobacco exposure;sedentary lifestyle     Objective:    Today's Vitals   03/16/23 1006  Weight: 133 lb (60.3 kg)  Height: 5\' 8"  (1.727 m)   Body mass index is 20.22 kg/m.     03/16/2023   10:09 AM 03/09/2022    2:50 PM 09/16/2020   11:30 AM 02/26/2020    1:11 PM 05/06/2019   11:10 PM 05/04/2019    3:27 PM 05/04/2019    2:03 PM  Advanced Directives  Does Patient Have a Medical Advance Directive? Yes Yes No No  No No  Type of Estate agent of [place];Living will Living will       Does patient want to make changes to medical advance directive?  No - Patient declined       Copy of Healthcare Power of Attorney in Chart? No - copy requested        Would patient like information on creating a medical advance directive?   No - Patient declined No - Patient declined  No - Patient declined      Information is confidential and restricted. Go to Review Flowsheets to unlock data.    Current Medications (verified) Outpatient Encounter Medications as of 03/16/2023  Medication Sig   amLODipine  (NORVASC) 2.5 MG tablet TAKE 1 TABLET BY MOUTH DAILY   atorvastatin (LIPITOR) 20 MG tablet Take 1 tablet (20 mg total) by mouth daily.   citalopram (CELEXA) 20 MG tablet Take 20 mg by mouth daily.   cyanocobalamin (VITAMIN B12) 1000 MCG tablet Take 1,000 mcg by mouth daily.   hydrOXYzine (VISTARIL) 50 MG capsule Take 50 mg by mouth in the morning, at noon, and at bedtime.   levothyroxine (SYNTHROID) 150 MCG tablet Take 1 tablet (150 mcg total) by mouth daily.   QUEtiapine (SEROQUEL XR) 200 MG 24 hr tablet Take 1 tablet (200 mg total) by mouth at bedtime. (Patient taking differently: Take 100 mg by mouth at bedtime.)   Facility-Administered Encounter Medications as of 03/16/2023  Medication   gadopentetate dimeglumine (MAGNEVIST) injection 15 mL    Allergies (verified) Benadryl [diphenhydramine] and Diphenhydramine hcl   History: Past Medical History:  Diagnosis Date   Anxiety and depression    Arthritis    Headache    Hemorrhoids    Hyperlipidemia 02/06/2020   Hypertension    Hypothyroidism    OSA on CPAP    doesn't use often pt states    Pneumonia    Seizures (HCC) 02/2016   only with Benadryl use   Suicide and self-inflicted injury (HCC)    Vitamin B12 deficiency 02/06/2020   Past Surgical History:  Procedure Laterality Date   ANTERIOR  CERVICAL DECOMP/DISCECTOMY FUSION N/A 09/20/2020   Procedure: ANTERIOR CERVICAL DECOMPRESSION/DISCECTOMY FUSION CERVICAL THREE- CERVICAL FOUR;  Surgeon: Bedelia Person, MD;  Location: [facility] OR;  Service: Neurosurgery;  Laterality: N/A;   BICEPS TENDON REPAIR Right    CARPAL TUNNEL RELEASE Bilateral    DECORTICATION Left 11/12/2018   Procedure: DECORTICATION;  Surgeon: Donata Clay, Theron Arista, MD;  Location: [facility] OR;  Service: Thoracic;  Laterality: Left;   KNEE CARTILAGE SURGERY Left    KNEE SURGERY Right    x 2   LAPAROTOMY N/A 11/03/2018   Procedure: EXPLORATORY LAPAROTOMY;  Surgeon: Harriette Bouillon, MD;  Location: [facility] OR;  Service: General;   Laterality: N/A;   VIDEO ASSISTED THORACOSCOPY (VATS)/EMPYEMA Left 11/12/2018   Procedure: VIDEO ASSISTED THORACOSCOPY (VATS)/EMPYEMA, MINI THORACOTOMY;  Surgeon: Kerin Perna, MD;  Location: [facility] OR;  Service: Thoracic;  Laterality: Left;  needs cell saver   Family History  Problem Relation Age of Onset   Kidney disease Mother    Hypertension Father    Multiple sclerosis Sister    Stroke Brother    Breast cancer Paternal Grandmother    Kidney failure Maternal Grandmother    Kidney disease Maternal Grandmother    Heart defect Maternal Uncle    Social History   Socioeconomic History   Marital status: Married    Spouse name: Lupita Leash   Number of children: 1   Years of education: 12   Highest education level: 12th grade  Occupational History   Occupation: Holiday representative    Comment: remodels houses   Occupation: Psychologist, 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   Tobacco Use   Smoking status: Every Day    Current packs/day: 1.50    Average packs/day: 1.5 packs/day for 55.6 years (83.4 ttl pk-yrs)    Types: Cigarettes    Start date: 1969   Smokeless tobacco: Never  Vaping Use   Vaping status: Never Used  Substance and Sexual Activity   Alcohol use: Not Currently    Comment: none since March 2020   Drug use: Not Currently    Frequency: 3.0 times per week    Types: Marijuana    Comment: 02/03/20- states no    Sexual activity: Not Currently    Partners: Female    Birth control/protection: None  Other Topics Concern   Not on file  Social History Narrative   ** Merged History Encounter **       Lives with wife Caffeine- coffee 2 cups daily   Social Determinants of Health   Financial Resource Strain: Low Risk  (03/16/2023)   Overall Financial Resource Strain (CARDIA)    Difficulty of Paying Living Expenses: Not hard at all  Food Insecurity: No Food Insecurity (03/16/2023)   Hunger Vital Sign    Worried About Running Out of Food in the Last Year: Never true    Ran Out of Food  in the Last Year: Never true  Transportation Needs: No Transportation Needs (03/16/2023)   PRAPARE - Administrator, Civil Service (Medical): No    Lack of Transportation (Non-Medical): No  Physical Activity: Inactive (03/16/2023)   Exercise Vital Sign    Days of Exercise per Week: 0 days    Minutes of Exercise per Session: 0 min  Stress: No Stress Concern Present (03/16/2023)   [employer] of Occupational Health - Occupational Stress Questionnaire    Feeling of Stress : Not at all  Social Connections: Moderately Isolated (03/16/2023)   Social Connection and Isolation Panel [  NHANES]    Frequency of Communication with Friends and Family: More than three times a week    Frequency of Social Gatherings with Friends and Family: More than three times a week    Attends Religious Services: Never    Database administrator or Organizations: No    Attends Engineer, structural: Never    Marital Status: Married    Tobacco Counseling Ready to quit: No Counseling given: Not Answered   Clinical Intake:  Pre-visit preparation completed: Yes  Pain : No/denies pain     Nutritional Risks: None Diabetes: No  How often do you need to have someone help you when you read instructions, pamphlets, or other written materials from your doctor or pharmacy?: 1 - Never  Interpreter Needed?: No  Information entered by :: Renie Ora, LPN   Activities of Daily Living    03/16/2023   10:09 AM  In your present state of health, do you have any difficulty performing the following activities:  Hearing? 0  Vision? 0  Difficulty concentrating or making decisions? 0  Walking or climbing stairs? 0  Dressing or bathing? 0  Doing errands, shopping? 0  Preparing Food and eating ? N  Using the Toilet? N  In the past six months, have you accidently leaked urine? N  Do you have problems with loss of bowel control? N  Managing your Medications? N  Managing your Finances? N  Housekeeping  or managing your Housekeeping? N    Patient Care Team: Sonny Masters, FNP as PCP - General (Family Medicine) Thurmon Fair, MD as PCP - Cardiology (Cardiology) Richmond Campbell., PA-C [facility] Medicine)  Indicate any recent Medical Services you may have received from other than [facility] providers in the past year (date may be approximate).     Assessment:   This is a routine wellness examination for [name].  Hearing/Vision screen Vision Screening - Comments:: Wears rx glasses - up to date with routine eye exams with  Dr.johnson   Dietary issues and exercise activities discussed:     Goals Addressed             This Visit's Progress    Exercise 3x per week (30 min per time)         Depression Screen    03/16/2023   10:08 AM 03/02/2023    9:10 AM 03/30/2022    3:17 PM 03/09/2022    3:04 PM 09/29/2021    3:07 PM 06/29/2021   10:26 AM 12/03/2020    2:42 PM  PHQ 2/9 Scores  PHQ - 2 Score 0 3 3 5 5 3 2   PHQ- 9 Score 0 8 6 13 13 10 7     Fall Risk    03/16/2023   10:07 AM 03/02/2023    9:10 AM 03/30/2022    3:17 PM 03/09/2022    3:03 PM 09/29/2021    3:06 PM  Fall Risk   Falls in the past year? 0 1 1 1 1   Number falls in past yr: 0 1 1 1 1   Injury with Fall? 0 0 0 0 0  Risk for fall due to : No Fall Risks      Follow up Falls prevention discussed Falls prevention discussed Falls prevention discussed Falls prevention discussed Falls prevention discussed    MEDICARE RISK AT HOME:  Medicare Risk at Home - 03/16/23 1007     Any stairs in or around the home? No    If so, are  there any without handrails? No    Home free of loose throw rugs in walkways, pet beds, electrical cords, etc? Yes    Adequate lighting in your home to reduce risk of falls? Yes    Life alert? No    Use of a cane, walker or w/c? Yes    Grab bars in the bathroom? No    Shower chair or bench in shower? No    Elevated toilet seat or a handicapped toilet? No             TIMED UP AND GO:  Was the  test performed?  No    Cognitive Function:    03/09/2022    2:54 PM  MMSE - Mini Mental State Exam  Orientation to time 5  Orientation to Place 5  Registration 3  Attention/ Calculation 5  Recall 2  Language- name 2 objects 2  Language- repeat 1  Language- follow 3 step command 3  Language- read & follow direction 1  Write a sentence 1  Copy design 1  Total score 29        03/16/2023   10:09 AM  6CIT Screen  What Year? 0 points  What month? 0 points  What time? 0 points  Count back from 20 0 points  Months in reverse 0 points  Repeat phrase 0 points  Total Score 0 points    Immunizations Immunization History  Administered Date(s) Administered   Influenza Split 06/19/2011   Influenza-Unspecified 06/19/2011   Janssen (J&J) SARS-COV-2 Vaccination 11/05/2019   Tdap 02/10/2009    TDAP status: Due, Education has been provided regarding the importance of this vaccine. Advised may receive this vaccine at local pharmacy or Health Dept. Aware to provide a copy of the vaccination record if obtained from local pharmacy or Health Dept. Verbalized acceptance and understanding.  Flu Vaccine status: Declined, Education has been provided regarding the importance of this vaccine but patient still declined. Advised may receive this vaccine at local pharmacy or Health Dept. Aware to provide a copy of the vaccination record if obtained from local pharmacy or Health Dept. Verbalized acceptance and understanding.  Pneumococcal vaccine status: Due, Education has been provided regarding the importance of this vaccine. Advised may receive this vaccine at local pharmacy or Health Dept. Aware to provide a copy of the vaccination record if obtained from local pharmacy or Health Dept. Verbalized acceptance and understanding.  Covid-19 vaccine status: Completed vaccines  Qualifies for Shingles Vaccine? Yes   Zostavax completed No   Shingrix Completed?: No.    Education has been provided regarding  the importance of this vaccine. Patient has been advised to call insurance company to determine out of pocket expense if they have not yet received this vaccine. Advised may also receive vaccine at local pharmacy or Health Dept. Verbalized acceptance and understanding.  Screening Tests Health Maintenance  Topic Date Due   DTaP/Tdap/Td (2 - Td or Tdap) 02/11/2019   Lung Cancer Screening  11/27/2019   Colonoscopy  03/17/2021   INFLUENZA VACCINE  03/08/2023   COVID-19 Vaccine (2 - 2023-24 season) 03/18/2023 (Originally 04/07/2022)   Zoster Vaccines- Shingrix (1 of 2) 06/02/2023 (Originally 02/09/2010)   Medicare Annual Wellness (AWV)  03/15/2024   Hepatitis C Screening  Completed   HIV Screening  Completed   HPV VACCINES  Aged Out    Health Maintenance  Health Maintenance Due  Topic Date Due   DTaP/Tdap/Td (2 - Td or Tdap) 02/11/2019   Lung Cancer Screening  11/27/2019   Colonoscopy  03/17/2021   INFLUENZA VACCINE  03/08/2023    Colorectal cancer screening: Referral to GI placed 03/16/2023. Pt aware the office will call re: appt.  Lung Cancer Screening: (Low Dose CT Chest recommended if Age 96-80 years, 20 pack-year currently smoking OR have quit w/in 15years.) does qualify.   Lung Cancer Screening Referral: referral 03/16/2023  Additional Screening:  Hepatitis C Screening: does not qualify;completed  09/29/2021  Vision Screening: Recommended annual ophthalmology exams for early detection of glaucoma and other disorders of the eye. Is the patient up to date with their annual eye exam?  No  Who is the provider or what is the name of the office in which the patient attends annual eye exams? Dr.Johnson  If pt is not established with a provider, would they like to be referred to a provider to establish care? No .   Dental Screening: Recommended annual dental exams for proper oral hygiene    Community Resource Referral / Chronic Care Management: CRR required this visit?  No   CCM  required this visit?  No     Plan:     I have personally reviewed and noted the following in the patient's chart:   Medical and social history Use of alcohol, tobacco or illicit drugs  Current medications and supplements including opioid prescriptions. Patient is not currently taking opioid prescriptions. Functional ability and status Nutritional status Physical activity Advanced directives List of other physicians Hospitalizations, surgeries, and ER visits in previous 12 months Vitals Screenings to include cognitive, depression, and falls Referrals and appointments  In addition, I have reviewed and discussed with patient certain preventive protocols, quality metrics, and best practice recommendations. A written personalized care plan for preventive services as well as general preventive health recommendations were provided to patient.     Lorrene Reid, LPN   08/12/1094   After Visit Summary: (MyChart) Due to this being a telephonic visit, the after visit summary with patients personalized plan was offered to patient via MyChart   Nurse Notes: Due TDAp /Pneumonia Vaccine

## 2023-03-19 ENCOUNTER — Encounter: Payer: Self-pay | Admitting: *Deleted

## 2023-03-20 ENCOUNTER — Other Ambulatory Visit: Payer: Medicare Other

## 2023-03-20 DIAGNOSIS — N289 Disorder of kidney and ureter, unspecified: Secondary | ICD-10-CM

## 2023-03-20 LAB — BMP8+EGFR
BUN/Creatinine Ratio: 11 (ref 10–24)
BUN: 17 mg/dL (ref 8–27)
CO2: 23 mmol/L (ref 20–29)
Calcium: 9.7 mg/dL (ref 8.6–10.2)
Chloride: 100 mmol/L (ref 96–106)
Creatinine, Ser: 1.51 mg/dL — ABNORMAL HIGH (ref 0.76–1.27)
Glucose: 96 mg/dL (ref 70–99)
Potassium: 4.6 mmol/L (ref 3.5–5.2)
Sodium: 136 mmol/L (ref 134–144)
eGFR: 52 mL/min/{1.73_m2} — ABNORMAL LOW (ref >59)

## 2023-03-29 ENCOUNTER — Other Ambulatory Visit: Payer: Self-pay | Admitting: Cardiovascular Disease

## 2023-03-29 DIAGNOSIS — I10 Essential (primary) hypertension: Secondary | ICD-10-CM

## 2023-04-11 ENCOUNTER — Telehealth: Payer: Self-pay | Admitting: Family Medicine

## 2023-04-11 DIAGNOSIS — R7989 Other specified abnormal findings of blood chemistry: Secondary | ICD-10-CM

## 2023-04-11 NOTE — Telephone Encounter (Signed)
Lab placed

## 2023-04-13 ENCOUNTER — Other Ambulatory Visit: Payer: Medicare Other

## 2023-04-13 DIAGNOSIS — R7989 Other specified abnormal findings of blood chemistry: Secondary | ICD-10-CM

## 2023-04-13 LAB — BMP8+EGFR
BUN/Creatinine Ratio: 11 (ref 10–24)
BUN: 13 mg/dL (ref 8–27)
CO2: 23 mmol/L (ref 20–29)
Calcium: 9.5 mg/dL (ref 8.6–10.2)
Chloride: 104 mmol/L (ref 96–106)
Creatinine, Ser: 1.17 mg/dL (ref 0.76–1.27)
Glucose: 98 mg/dL (ref 70–99)
Potassium: 4.4 mmol/L (ref 3.5–5.2)
Sodium: 140 mmol/L (ref 134–144)
eGFR: 70 mL/min/{1.73_m2} (ref >59)

## 2023-07-20 ENCOUNTER — Ambulatory Visit: Payer: Medicare Other | Attending: Cardiovascular Disease | Admitting: Cardiovascular Disease

## 2023-07-20 ENCOUNTER — Encounter: Payer: Self-pay | Admitting: Cardiovascular Disease

## 2023-07-20 VITALS — BP 126/78 | HR 59 | Ht 68.0 in | Wt 142.4 lb

## 2023-07-20 DIAGNOSIS — F17200 Nicotine dependence, unspecified, uncomplicated: Secondary | ICD-10-CM | POA: Diagnosis not present

## 2023-07-20 DIAGNOSIS — I444 Left anterior fascicular block: Secondary | ICD-10-CM | POA: Diagnosis not present

## 2023-07-20 DIAGNOSIS — E785 Hyperlipidemia, unspecified: Secondary | ICD-10-CM

## 2023-07-20 DIAGNOSIS — G4733 Obstructive sleep apnea (adult) (pediatric): Secondary | ICD-10-CM | POA: Diagnosis not present

## 2023-07-20 DIAGNOSIS — I700 Atherosclerosis of aorta: Secondary | ICD-10-CM

## 2023-07-20 DIAGNOSIS — E039 Hypothyroidism, unspecified: Secondary | ICD-10-CM | POA: Diagnosis not present

## 2023-07-20 DIAGNOSIS — I10 Essential (primary) hypertension: Secondary | ICD-10-CM | POA: Diagnosis not present

## 2023-07-20 NOTE — Patient Instructions (Signed)
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
## 2023-08-02 DIAGNOSIS — H43812 Vitreous degeneration, left eye: Secondary | ICD-10-CM | POA: Diagnosis not present

## 2023-08-02 DIAGNOSIS — H2513 Age-related nuclear cataract, bilateral: Secondary | ICD-10-CM | POA: Diagnosis not present

## 2023-08-06 DIAGNOSIS — H2512 Age-related nuclear cataract, left eye: Secondary | ICD-10-CM | POA: Diagnosis not present

## 2023-08-06 DIAGNOSIS — I10 Essential (primary) hypertension: Secondary | ICD-10-CM | POA: Diagnosis not present

## 2023-08-06 DIAGNOSIS — H2513 Age-related nuclear cataract, bilateral: Secondary | ICD-10-CM | POA: Diagnosis not present

## 2023-08-26 ENCOUNTER — Other Ambulatory Visit: Payer: Self-pay | Admitting: Family Medicine

## 2023-08-26 DIAGNOSIS — I700 Atherosclerosis of aorta: Secondary | ICD-10-CM

## 2023-08-26 DIAGNOSIS — E782 Mixed hyperlipidemia: Secondary | ICD-10-CM

## 2023-08-31 ENCOUNTER — Encounter: Payer: Self-pay | Admitting: Family Medicine

## 2023-08-31 ENCOUNTER — Ambulatory Visit (INDEPENDENT_AMBULATORY_CARE_PROVIDER_SITE_OTHER): Payer: Medicare Other | Admitting: Family Medicine

## 2023-08-31 VITALS — BP 132/78 | HR 62 | Temp 97.1°F | Ht 68.0 in | Wt 139.6 lb

## 2023-08-31 DIAGNOSIS — Z125 Encounter for screening for malignant neoplasm of prostate: Secondary | ICD-10-CM | POA: Diagnosis not present

## 2023-08-31 DIAGNOSIS — Z1211 Encounter for screening for malignant neoplasm of colon: Secondary | ICD-10-CM

## 2023-08-31 DIAGNOSIS — E538 Deficiency of other specified B group vitamins: Secondary | ICD-10-CM

## 2023-08-31 DIAGNOSIS — Z23 Encounter for immunization: Secondary | ICD-10-CM

## 2023-08-31 DIAGNOSIS — Z0000 Encounter for general adult medical examination without abnormal findings: Secondary | ICD-10-CM | POA: Diagnosis not present

## 2023-08-31 DIAGNOSIS — I700 Atherosclerosis of aorta: Secondary | ICD-10-CM

## 2023-08-31 DIAGNOSIS — E039 Hypothyroidism, unspecified: Secondary | ICD-10-CM

## 2023-08-31 DIAGNOSIS — Z122 Encounter for screening for malignant neoplasm of respiratory organs: Secondary | ICD-10-CM | POA: Diagnosis not present

## 2023-08-31 DIAGNOSIS — F1021 Alcohol dependence, in remission: Secondary | ICD-10-CM

## 2023-08-31 DIAGNOSIS — Z0001 Encounter for general adult medical examination with abnormal findings: Secondary | ICD-10-CM

## 2023-08-31 DIAGNOSIS — I10 Essential (primary) hypertension: Secondary | ICD-10-CM

## 2023-08-31 DIAGNOSIS — F319 Bipolar disorder, unspecified: Secondary | ICD-10-CM

## 2023-08-31 DIAGNOSIS — F1220 Cannabis dependence, uncomplicated: Secondary | ICD-10-CM

## 2023-08-31 LAB — LIPID PANEL

## 2023-08-31 MED ORDER — AMLODIPINE BESYLATE 2.5 MG PO TABS: 2.5000 mg | ORAL_TABLET | Freq: Every day | ORAL | 1 refills | Status: DC
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
## 2023-08-31 NOTE — Addendum Note (Signed)
Addended by: Sonny Masters on: 08/31/2023 03:35 PM   Modules accepted: Orders

## 2023-09-01 LAB — CBC WITH DIFFERENTIAL/PLATELET
Basophils Absolute: 0.1 10*3/uL (ref 0.0–0.2)
Basos: 1 %
EOS (ABSOLUTE): 0.2 10*3/uL (ref 0.0–0.4)
Eos: 2 %
Hematocrit: 37.5 % (ref 37.5–51.0)
Hemoglobin: 12.6 g/dL — ABNORMAL LOW (ref 13.0–17.7)
Immature Grans (Abs): 0.1 10*3/uL (ref 0.0–0.1)
Immature Granulocytes: 1 %
Lymphocytes Absolute: 3.5 10*3/uL — ABNORMAL HIGH (ref 0.7–3.1)
Lymphs: 40 %
MCH: 30.4 pg (ref 26.6–33.0)
MCHC: 33.6 g/dL (ref 31.5–35.7)
MCV: 90 fL (ref 79–97)
Monocytes Absolute: 0.8 10*3/uL (ref 0.1–0.9)
Monocytes: 10 %
Neutrophils Absolute: 4 10*3/uL (ref 1.4–7.0)
Neutrophils: 46 %
Platelets: 245 10*3/uL (ref 150–450)
RBC: 4.15 x10E6/uL (ref 4.14–5.80)
RDW: 16.5 % — ABNORMAL HIGH (ref 11.6–15.4)
WBC: 8.7 10*3/uL (ref 3.4–10.8)

## 2023-09-01 LAB — PSA, TOTAL AND FREE
PSA, Free Pct: 42.9 %
PSA, Free: 0.3 ng/mL
Prostate Specific Ag, Serum: 0.7 ng/mL (ref 0.0–4.0)

## 2023-09-01 LAB — LIPID PANEL
Cholesterol, Total: 111 mg/dL (ref 100–199)
HDL: 36 mg/dL — ABNORMAL LOW (ref >39)
LDL CALC COMMENT:: 3.1 ratio (ref 0.0–5.0)
LDL Chol Calc (NIH): 63 mg/dL (ref 0–99)
Triglycerides: 54 mg/dL (ref 0–149)
VLDL Cholesterol Cal: 12 mg/dL (ref 5–40)

## 2023-09-01 LAB — CMP14+EGFR
ALT: 9 [IU]/L (ref 0–44)
AST: 12 [IU]/L (ref 0–40)
Albumin: 4.5 g/dL (ref 3.9–4.9)
Alkaline Phosphatase: 127 [IU]/L — ABNORMAL HIGH (ref 44–121)
BUN/Creatinine Ratio: 14 (ref 10–24)
BUN: 20 mg/dL (ref 8–27)
Bilirubin Total: 0.4 mg/dL (ref 0.0–1.2)
CO2: 23 mmol/L (ref 20–29)
Calcium: 9.5 mg/dL (ref 8.6–10.2)
Chloride: 99 mmol/L (ref 96–106)
Creatinine, Ser: 1.48 mg/dL — ABNORMAL HIGH (ref 0.76–1.27)
Globulin, Total: 2.4 g/dL (ref 1.5–4.5)
Glucose: 99 mg/dL (ref 70–99)
Potassium: 4.6 mmol/L (ref 3.5–5.2)
Sodium: 136 mmol/L (ref 134–144)
Total Protein: 6.9 g/dL (ref 6.0–8.5)
eGFR: 53 mL/min/{1.73_m2} — ABNORMAL LOW (ref >59)

## 2023-09-01 LAB — THYROID PANEL WITH TSH
Free Thyroxine Index: 2.4 (ref 1.2–4.9)
T3 Uptake Ratio: 29 % (ref 24–39)
T4, Total: 8.3 ug/dL (ref 4.5–12.0)
TSH: 11.2 u[IU]/mL — ABNORMAL HIGH (ref 0.450–4.500)

## 2023-09-01 LAB — VITAMIN B12: Vitamin B-12: 1267 pg/mL — ABNORMAL HIGH (ref 232–1245)

## 2023-09-03 ENCOUNTER — Encounter: Payer: Self-pay | Admitting: Family Medicine

## 2023-09-11 ENCOUNTER — Other Ambulatory Visit: Payer: Medicare Other

## 2023-09-11 DIAGNOSIS — I10 Essential (primary) hypertension: Secondary | ICD-10-CM

## 2023-09-11 LAB — CMP14+EGFR
ALT: 8 [IU]/L (ref 0–44)
AST: 9 [IU]/L (ref 0–40)
Albumin: 3.8 g/dL — ABNORMAL LOW (ref 3.9–4.9)
Alkaline Phosphatase: 113 [IU]/L (ref 44–121)
BUN/Creatinine Ratio: 12 (ref 10–24)
BUN: 16 mg/dL (ref 8–27)
Bilirubin Total: 0.3 mg/dL (ref 0.0–1.2)
CO2: 23 mmol/L (ref 20–29)
Calcium: 8.9 mg/dL (ref 8.6–10.2)
Chloride: 102 mmol/L (ref 96–106)
Creatinine, Ser: 1.38 mg/dL — ABNORMAL HIGH (ref 0.76–1.27)
Globulin, Total: 2.2 g/dL (ref 1.5–4.5)
Glucose: 99 mg/dL (ref 70–99)
Potassium: 4.7 mmol/L (ref 3.5–5.2)
Sodium: 138 mmol/L (ref 134–144)
Total Protein: 6 g/dL (ref 6.0–8.5)
eGFR: 57 mL/min/{1.73_m2} — ABNORMAL LOW (ref >59)

## 2023-09-19 ENCOUNTER — Encounter (INDEPENDENT_AMBULATORY_CARE_PROVIDER_SITE_OTHER): Payer: Self-pay | Admitting: *Deleted

## 2023-10-05 ENCOUNTER — Other Ambulatory Visit: Payer: Self-pay | Admitting: Family Medicine

## 2023-10-05 DIAGNOSIS — I700 Atherosclerosis of aorta: Secondary | ICD-10-CM

## 2023-10-05 DIAGNOSIS — E782 Mixed hyperlipidemia: Secondary | ICD-10-CM

## 2023-10-18 DIAGNOSIS — H2511 Age-related nuclear cataract, right eye: Secondary | ICD-10-CM | POA: Diagnosis not present

## 2023-10-18 DIAGNOSIS — H25011 Cortical age-related cataract, right eye: Secondary | ICD-10-CM | POA: Diagnosis not present

## 2023-10-18 DIAGNOSIS — H25041 Posterior subcapsular polar age-related cataract, right eye: Secondary | ICD-10-CM | POA: Diagnosis not present

## 2023-10-18 DIAGNOSIS — H2512 Age-related nuclear cataract, left eye: Secondary | ICD-10-CM | POA: Diagnosis not present

## 2023-11-08 DIAGNOSIS — H2511 Age-related nuclear cataract, right eye: Secondary | ICD-10-CM | POA: Diagnosis not present

## 2023-12-13 DIAGNOSIS — H5213 Myopia, bilateral: Secondary | ICD-10-CM | POA: Diagnosis not present

## 2024-01-08 ENCOUNTER — Other Ambulatory Visit: Payer: Self-pay | Admitting: Family Medicine

## 2024-01-08 DIAGNOSIS — I10 Essential (primary) hypertension: Secondary | ICD-10-CM

## 2024-02-29 ENCOUNTER — Ambulatory Visit: Payer: Medicare Other | Admitting: Family Medicine

## 2024-03-12 ENCOUNTER — Ambulatory Visit (INDEPENDENT_AMBULATORY_CARE_PROVIDER_SITE_OTHER): Admitting: Family Medicine

## 2024-03-12 ENCOUNTER — Encounter: Payer: Self-pay | Admitting: Family Medicine

## 2024-03-12 VITALS — BP 132/80 | HR 64 | Temp 96.1°F | Ht 68.0 in | Wt 146.0 lb

## 2024-03-12 DIAGNOSIS — I700 Atherosclerosis of aorta: Secondary | ICD-10-CM | POA: Diagnosis not present

## 2024-03-12 DIAGNOSIS — F4323 Adjustment disorder with mixed anxiety and depressed mood: Secondary | ICD-10-CM

## 2024-03-12 DIAGNOSIS — R7989 Other specified abnormal findings of blood chemistry: Secondary | ICD-10-CM

## 2024-03-12 DIAGNOSIS — I10 Essential (primary) hypertension: Secondary | ICD-10-CM | POA: Diagnosis not present

## 2024-03-12 DIAGNOSIS — E782 Mixed hyperlipidemia: Secondary | ICD-10-CM | POA: Diagnosis not present

## 2024-03-12 DIAGNOSIS — F332 Major depressive disorder, recurrent severe without psychotic features: Secondary | ICD-10-CM

## 2024-03-12 DIAGNOSIS — E039 Hypothyroidism, unspecified: Secondary | ICD-10-CM | POA: Diagnosis not present

## 2024-03-12 DIAGNOSIS — E538 Deficiency of other specified B group vitamins: Secondary | ICD-10-CM

## 2024-03-12 MED ORDER — LEVOTHYROXINE SODIUM 150 MCG PO TABS: 150.0000 ug | ORAL_TABLET | Freq: Every day | ORAL | 1 refills | Status: DC

## 2024-03-12 MED ORDER — ATORVASTATIN CALCIUM 20 MG PO TABS: 20.0000 mg | ORAL_TABLET | Freq: Every day | ORAL | 2 refills | Status: DC

## 2024-03-12 MED ORDER — AMLODIPINE BESYLATE 2.5 MG PO TABS: 2.5000 mg | ORAL_TABLET | Freq: Every day | ORAL | 2 refills | Status: DC

## 2024-03-12 NOTE — Patient Instructions (Signed)
 Goal BP:  For patients younger than 60: Goal BP < 140/90. For patients 60 and older: Goal BP < 150/90. For patients with diabetes: Goal BP < 140/90.  Take your medications faithfully as prescribed. Maintain a healthy weight. Get at least 150 minutes of aerobic exercise per week. Minimize salt intake, less than 2000 mg per day. Minimize alcohol intake.  DASH Eating Plan DASH stands for "Dietary Approaches to Stop Hypertension." The DASH eating plan is a healthy eating plan that has been shown to reduce high blood pressure (hypertension). Additional health benefits may include reducing the risk of type 2 diabetes mellitus, heart disease, and stroke. The DASH eating plan may also help with weight loss.  WHAT DO I NEED TO KNOW ABOUT THE DASH EATING PLAN? For the DASH eating plan, you will follow these general guidelines: Choose foods with a percent daily value for sodium of less than 5% (as listed on the food label). Use salt-free seasonings or herbs instead of table salt or sea salt. Check with your health care provider or pharmacist before using salt substitutes. Eat lower-sodium products, often labeled as "lower sodium" or "no salt added." Eat fresh foods. Eat more vegetables, fruits, and low-fat dairy products. Choose whole grains. Look for the word "whole" as the first word in the ingredient list. Choose fish and skinless chicken or [place] more often than red meat. Limit fish, poultry, and meat to 6 oz (170 g) each day. Limit sweets, desserts, sugars, and sugary drinks. Choose heart-healthy fats. Limit cheese to 1 oz (28 g) per day. Eat more home-cooked food and less restaurant, buffet, and fast food. Limit fried foods. Cook foods using methods other than frying. Limit canned vegetables. If you do use them, rinse them well to decrease the sodium. When eating at a restaurant, ask that your food be prepared with less salt, or no salt if possible.  WHAT FOODS CAN I EAT? Seek help from  a dietitian for individual calorie needs.  Grains Whole grain or whole wheat bread. Brown rice. Whole grain or whole wheat pasta. Quinoa, bulgur, and whole grain cereals. Low-sodium cereals. Corn or whole wheat flour tortillas. Whole grain cornbread. Whole grain crackers. Low-sodium crackers.  Vegetables Fresh or frozen vegetables (raw, steamed, roasted, or grilled). Low-sodium or reduced-sodium tomato and vegetable juices. Low-sodium or reduced-sodium tomato sauce and paste. Low-sodium or reduced-sodium canned vegetables.   Fruits All fresh, canned (in natural juice), or frozen fruits.  Meat and Other Protein Products Ground beef (85% or leaner), grass-fed beef, or beef trimmed of fat. Skinless chicken or [place]. Ground chicken or [place]. Pork trimmed of fat. All fish and seafood. Eggs. Dried beans, peas, or lentils. Unsalted nuts and seeds. Unsalted canned beans.  Dairy Low-fat dairy products, such as skim or 1% milk, 2% or reduced-fat cheeses, low-fat ricotta or cottage cheese, or plain low-fat yogurt. Low-sodium or reduced-sodium cheeses.  Fats and Oils Tub margarines without trans fats. Light or reduced-fat mayonnaise and salad dressings (reduced sodium). Avocado. Safflower, olive, or canola oils. Natural peanut or almond butter.  Other Unsalted popcorn and pretzels. The items listed above may not be a complete list of recommended foods or beverages. Contact your dietitian for more options.  WHAT FOODS ARE NOT RECOMMENDED?  Grains White bread. White pasta. White rice. Refined cornbread. Bagels and croissants. Crackers that contain trans fat.  Vegetables Creamed or fried vegetables. Vegetables in a cheese sauce. Regular canned vegetables. Regular canned tomato sauce and paste. Regular tomato and vegetable juices.  Fruits Dried fruits. Canned fruit in light or heavy syrup. Fruit juice.  Meat and Other Protein Products Fatty cuts of meat. Ribs, chicken wings, bacon, sausage,  bologna, salami, chitterlings, fatback, hot dogs, bratwurst, and packaged luncheon meats. Salted nuts and seeds. Canned beans with salt.  Dairy Whole or 2% milk, cream, half-and-half, and cream cheese. Whole-fat or sweetened yogurt. Full-fat cheeses or blue cheese. Nondairy creamers and whipped toppings. Processed cheese, cheese spreads, or cheese curds.  Condiments Onion and garlic salt, seasoned salt, table salt, and sea salt. Canned and packaged gravies. Worcestershire sauce. Tartar sauce. Barbecue sauce. Teriyaki sauce. Soy sauce, including reduced sodium. Steak sauce. Fish sauce. Oyster sauce. Cocktail sauce. Horseradish. Ketchup and mustard. Meat flavorings and tenderizers. Bouillon cubes. Hot sauce. Tabasco sauce. Marinades. Taco seasonings. Relishes.  Fats and Oils Butter, stick margarine, lard, shortening, ghee, and bacon fat. Coconut, palm kernel, or palm oils. Regular salad dressings.  Other Pickles and olives. Salted popcorn and pretzels.  The items listed above may not be a complete list of foods and beverages to avoid. Contact your dietitian for more information.  WHERE CAN I FIND MORE INFORMATION? National Heart, Lung, and Blood Institute: [employer] Document Released: 07/13/2011 Document Revised: 12/08/2013 Document Reviewed: 05/28/2013 [facility] Patient Information 2015 [place], [place]. This information is not intended to replace advice given to you by your health care provider. Make sure you discuss any questions you have with your health care provider.   I think that you would greatly benefit from seeing a nutritionist.  If you are interested, please call Dr. Gerilyn Pilgrim at [phone] to schedule an appointment.

## 2024-03-12 NOTE — Progress Notes (Signed)
 Subjective:  Patient ID: [name], male    DOB: 07/25/1961, 63 y.o.   MRN: [id number]  Patient Care Team: Severa Rock HERO, FNP as PCP - General (Family Medicine) Croitoru, Jerel, MD as PCP - Cardiology (Cardiology) Debrah Josette MOHR PA-C [facility] Medicine)   Chief Complaint:  Medical Management of Chronic Issues (6 month chronic follow up )   HPI: [name] is a 63 y.o. male presenting on 03/12/2024 for Medical Management of Chronic Issues (6 month chronic follow up )   [name] is a 63 year old male who presents for chronic follow up and management of medical conditions.  He experiences increasing fatigue and apathy, expressing indifference towards daily activities and responsibilities. He attributes some stress to family dynamics, particularly with his wife and grandchildren, which he finds overwhelming at times. He has a follow up with psychiatry next week to discuss possible medication changes.   He is currently on blood pressure medication but occasionally forgets to take it in the mornings. He consistently takes his nighttime medications, including atorvastatin  for cholesterol and thyroid  medication. His TSH levels have been elevated in the past. He experiences occasional fatigue and a sensation of significant fatigue which resolves with rest.  He acknowledges not drinking enough water . He denies using over-the-counter medications like Advil  or Aleve.  He describes a challenging home environment, with his wife expressing a desire to [place] the beach and concerns over his grandchildren's living situation. He feels stressed by these family issues and the perceived lack of control over them.  No muscle aches or pains beyond normal levels, no leg swelling, chest pain, or headaches. Normal urination.       Relevant past medical, surgical, family, and social history reviewed and updated as indicated.  Allergies and medications reviewed and updated. Data  reviewed: Chart in Epic.   Past Medical History:  Diagnosis Date   Anxiety and depression    Arthritis    Headache    Hemorrhoids    Hyperlipidemia 02/06/2020   Hypertension    Hypothyroidism    OSA on CPAP    doesn't use often pt states    Pneumonia    Seizures (HCC) 02/2016   only with Benadryl  use   Suicide and self-inflicted injury (HCC)    Vitamin B12 deficiency 02/06/2020    Past Surgical History:  Procedure Laterality Date   ANTERIOR CERVICAL DECOMP/DISCECTOMY FUSION N/A 09/20/2020   Procedure: ANTERIOR CERVICAL DECOMPRESSION/DISCECTOMY FUSION CERVICAL THREE- CERVICAL FOUR;  Surgeon: Debby Dorn MATSU, MD;  Location: [facility] OR;  Service: Neurosurgery;  Laterality: N/A;   BICEPS TENDON REPAIR Right    CARPAL TUNNEL RELEASE Bilateral    DECORTICATION Left 11/12/2018   Procedure: DECORTICATION;  Surgeon: Fleeta Hanford Coy, MD;  Location: [facility] OR;  Service: Thoracic;  Laterality: Left;   KNEE CARTILAGE SURGERY Left    KNEE SURGERY Right    x 2   LAPAROTOMY N/A 11/03/2018   Procedure: EXPLORATORY LAPAROTOMY;  Surgeon: Vanderbilt Debby, MD;  Location: [facility] OR;  Service: General;  Laterality: N/A;   VIDEO ASSISTED THORACOSCOPY (VATS)/EMPYEMA Left 11/12/2018   Procedure: VIDEO ASSISTED THORACOSCOPY (VATS)/EMPYEMA, MINI THORACOTOMY;  Surgeon: Fleeta Hanford Coy, MD;  Location: [facility] OR;  Service: Thoracic;  Laterality: Left;  needs cell saver    Social History   Socioeconomic History   Marital status: Married    Spouse name: Arland   Number of children: 1   Years of education: 12   Highest  education level: 12th grade  Occupational History   Occupation: Holiday representative    Comment: remodels houses   Occupation: Psychologist, 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   Tobacco Use   Smoking status: Every Day    Current packs/day: 1.50    Average packs/day: 1.5 packs/day for 56.6 years (84.9 ttl pk-yrs)    Types: Cigarettes    Start date: 1969   Smokeless tobacco: Never   Tobacco comments:     07/20/2023 Patient smokes a pack daily  Vaping Use   Vaping status: Never Used  Substance and Sexual Activity   Alcohol use: Not Currently    Comment: none since March 2020   Drug use: Not Currently    Frequency: 3.0 times per week    Types: Marijuana    Comment: 02/03/20- states no    Sexual activity: Not Currently    Partners: Female    Birth control/protection: None  Other Topics Concern   Not on file  Social History Narrative   ** Merged History Encounter **       Lives with wife Caffeine- coffee 2 cups daily   Social Drivers of Health   Financial Resource Strain: Low Risk  (03/16/2023)   Overall Financial Resource Strain (CARDIA)    Difficulty of Paying Living Expenses: Not hard at all  Food Insecurity: No Food Insecurity (03/16/2023)   Hunger Vital Sign    Worried About Running Out of Food in the Last Year: Never true    Ran Out of Food in the Last Year: Never true  Transportation Needs: No Transportation Needs (03/16/2023)   PRAPARE - Administrator, Civil Service (Medical): No    Lack of Transportation (Non-Medical): No  Physical Activity: Inactive (03/16/2023)   Exercise Vital Sign    Days of Exercise per Week: 0 days    Minutes of Exercise per Session: 0 min  Stress: No Stress Concern Present (03/16/2023)   [employer] of Occupational Health - Occupational Stress Questionnaire    Feeling of Stress : Not at all  Social Connections: Moderately Isolated (03/16/2023)   Social Connection and Isolation Panel    Frequency of Communication with Friends and Family: More than three times a week    Frequency of Social Gatherings with Friends and Family: More than three times a week    Attends Religious Services: Never    Database administrator or Organizations: No    Attends Banker Meetings: Never    Marital Status: Married  Catering manager Violence: Not At Risk (03/16/2023)   Humiliation, Afraid, Rape, and Kick questionnaire    Fear of Current or  Ex-Partner: No    Emotionally Abused: No    Physically Abused: No    Sexually Abused: No    Outpatient Encounter Medications as of 03/12/2024  Medication Sig   citalopram (CELEXA) 20 MG tablet Take 20 mg by mouth daily.   cyanocobalamin  (VITAMIN B12) 1000 MCG tablet Take 1,000 mcg by mouth daily.   hydrOXYzine  (VISTARIL ) 50 MG capsule Take 50 mg by mouth in the morning, at noon, and at bedtime.   QUEtiapine  (SEROQUEL ) 100 MG tablet Take 100 mg by mouth at bedtime.   [DISCONTINUED] amLODipine  (NORVASC ) 2.5 MG tablet TAKE 1 TABLET BY MOUTH DAILY   [DISCONTINUED] atorvastatin  (LIPITOR) 20 MG tablet TAKE 1 TABLET BY MOUTH DAILY   [DISCONTINUED] levothyroxine  (SYNTHROID ) 150 MCG tablet Take 1 tablet (150 mcg total) by mouth daily.   amLODipine  (NORVASC ) 2.5  MG tablet Take 1 tablet (2.5 mg total) by mouth daily.   atorvastatin  (LIPITOR) 20 MG tablet Take 1 tablet (20 mg total) by mouth daily.   levothyroxine  (SYNTHROID ) 150 MCG tablet Take 1 tablet (150 mcg total) by mouth daily.   Facility-Administered Encounter Medications as of 03/12/2024  Medication   gadopentetate dimeglumine  (MAGNEVIST ) injection 15 mL    Allergies  Allergen Reactions   Benadryl  [Diphenhydramine ] Other (See Comments)    throws me for a loop. Seizure   Diphenhydramine  Hcl Other (See Comments)    Pertinent ROS per HPI, otherwise unremarkable      Objective:  BP 132/80   Pulse 64   Temp (!) 96.1 F (35.6 C)   Ht 5' 8 (1.727 m)   Wt 146 lb (66.2 kg)   SpO2 98%   BMI 22.20 kg/m    Wt Readings from Last 3 Encounters:  03/12/24 146 lb (66.2 kg)  08/31/23 139 lb 9.6 oz (63.3 kg)  07/20/23 142 lb 6.4 oz (64.6 kg)    Physical Exam Vitals and nursing note reviewed.  Constitutional:      General: He is not in acute distress.    Appearance: Normal appearance. He is normal weight. He is not ill-appearing, toxic-appearing or diaphoretic.  HENT:     Head: Normocephalic and atraumatic.     Mouth/Throat:      Mouth: Mucous membranes are moist.     Dentition: Has dentures.  Eyes:     Conjunctiva/sclera: Conjunctivae normal.     Pupils: Pupils are equal, round, and reactive to light.  Cardiovascular:     Rate and Rhythm: Normal rate and regular rhythm.     Heart sounds: Normal heart sounds.  Pulmonary:     Effort: Pulmonary effort is normal.     Breath sounds: Normal breath sounds.  Abdominal:     Palpations: Abdomen is soft.  Musculoskeletal:     Right lower leg: No edema.     Left lower leg: No edema.  Skin:    General: Skin is warm and dry.     Capillary Refill: Capillary refill takes less than 2 seconds.  Neurological:     General: No focal deficit present.     Mental Status: He is alert and oriented to person, place, and time.  Psychiatric:        Attention and Perception: Attention normal.        Mood and Affect: Mood normal. Affect is angry.        Behavior: Behavior normal.        Thought Content: Thought content normal.        Judgment: Judgment normal.     Comments: Hyperverbal     Results for orders placed or performed in visit on 09/11/23  CMP14+EGFR   Collection Time: 09/11/23  9:36 AM  Result Value Ref Range   Glucose 99 70 - 99 mg/dL   BUN 16 8 - 27 mg/dL   Creatinine, Ser 8.61 (H) 0.76 - 1.27 mg/dL   eGFR 57 (L) >40 fO/fpw/8.26   BUN/Creatinine Ratio 12 10 - 24   Sodium 138 134 - 144 mmol/L   Potassium 4.7 3.5 - 5.2 mmol/L   Chloride 102 96 - 106 mmol/L   CO2 23 20 - 29 mmol/L   Calcium  8.9 8.6 - 10.2 mg/dL   Total Protein 6.0 6.0 - 8.5 g/dL   Albumin  3.8 (L) 3.9 - 4.9 g/dL   Globulin, Total 2.2 1.5 - 4.5 g/dL  Bilirubin Total 0.3 0.0 - 1.2 mg/dL   Alkaline Phosphatase 113 44 - 121 IU/L   AST 9 0 - 40 IU/L   ALT 8 0 - 44 IU/L       Pertinent labs & imaging results that were available during my care of the patient were reviewed by me and considered in my medical decision making.  Assessment & Plan:  Turon was seen today for medical management of  chronic issues.  Diagnoses and all orders for this visit:  Atherosclerosis of aorta (HCC) -     CMP14+EGFR -     CBC with Differential/Platelet -     atorvastatin  (LIPITOR) 20 MG tablet; Take 1 tablet (20 mg total) by mouth daily. -     Lipid panel  Acquired hypothyroidism -     Vitamin B12 -     Thyroid  Panel With TSH -     levothyroxine  (SYNTHROID ) 150 MCG tablet; Take 1 tablet (150 mcg total) by mouth daily. -     Lipid panel  Mixed hyperlipidemia -     CMP14+EGFR -     atorvastatin  (LIPITOR) 20 MG tablet; Take 1 tablet (20 mg total) by mouth daily. -     Lipid panel  Essential hypertension -     CMP14+EGFR -     Thyroid  Panel With TSH -     CBC with Differential/Platelet -     amLODipine  (NORVASC ) 2.5 MG tablet; Take 1 tablet (2.5 mg total) by mouth daily.  Vitamin B12 deficiency -     Vitamin B12 -     CBC with Differential/Platelet  Severe episode of recurrent major depressive disorder, without psychotic features (HCC) -     Vitamin B12 -     CMP14+EGFR -     Thyroid  Panel With TSH -     CBC with Differential/Platelet  Adjustment disorder with mixed anxiety and depressed mood -     Vitamin B12 -     CMP14+EGFR -     Thyroid  Panel With TSH -     CBC with Differential/Platelet  Abnormal blood creatinine level -     CMP14+EGFR -     CBC with Differential/Platelet      Depression Reports apathy, indifference towards daily activities, and frustration with family dynamics and lack of control over personal circumstances. - Continue current medications until review with Dr. Karolynn. - Discuss potential medication adjustments with Dr. Karolynn on August 12.  Essential hypertension Hypertension with occasional missed doses of amlodipine . Reports fatigue and sensation of heart stopping during physical exertion. No leg swelling, chest pain, or headaches reported. - Encourage adherence to daily medication regimen.  Chronic kidney disease, unspecified stage Chronic kidney  disease with previous decline in kidney function. Reports inadequate water  intake, potentially contributing to kidney function decline. No issues with urination reported. - Encourage increased water  intake. - Avoid NSAIDs like Advil , Aleve, and Motrin . - Order repeat labs to assess kidney function.  Hypothyroidism Hypothyroidism with elevated TSH levels on previous tests. Reports occasional missed doses of thyroid  medication, particularly in the morning. - Encourage adherence to daily thyroid  medication. - Consider switching thyroid  medication to nighttime dosing for better compliance.  Hyperlipidemia Hyperlipidemia managed with atorvastatin . No reported muscle aches or pains beyond normal physical activity-related discomfort. - Continue atorvastatin  as prescribed. - Encourage adherence to medication regimen.          Continue all other maintenance medications.  Follow up plan: Return in about 6 months (around 09/12/2024) for  Annual Physical.   Continue healthy lifestyle choices, including diet (rich in fruits, vegetables, and lean proteins, and low in salt and simple carbohydrates) and exercise (at least 30 minutes of moderate physical activity daily).  Educational handout given for DASH diet, HTN  The above assessment and management plan was discussed with the patient. The patient verbalized understanding of and has agreed to the management plan. Patient is aware to call the clinic if they develop any new symptoms or if symptoms persist or worsen. Patient is aware when to return to the clinic for a follow-up visit. Patient educated on when it is appropriate to go to the emergency department.   Rosaline Bruns, FNP-C [facility] [phone]

## 2024-03-13 ENCOUNTER — Ambulatory Visit: Payer: Self-pay | Admitting: Family Medicine

## 2024-03-13 DIAGNOSIS — E039 Hypothyroidism, unspecified: Secondary | ICD-10-CM

## 2024-03-13 DIAGNOSIS — D649 Anemia, unspecified: Secondary | ICD-10-CM

## 2024-03-13 DIAGNOSIS — N1831 Chronic kidney disease, stage 3a: Secondary | ICD-10-CM

## 2024-03-13 LAB — CMP14+EGFR
ALT: 15 IU/L (ref 0–44)
AST: 24 IU/L (ref 0–40)
Albumin: 4.5 g/dL (ref 3.9–4.9)
Alkaline Phosphatase: 100 IU/L (ref 44–121)
BUN/Creatinine Ratio: 10 (ref 10–24)
BUN: 15 mg/dL (ref 8–27)
Bilirubin Total: 0.3 mg/dL (ref 0.0–1.2)
CO2: 21 mmol/L (ref 20–29)
Calcium: 9.1 mg/dL (ref 8.6–10.2)
Chloride: 102 mmol/L (ref 96–106)
Creatinine, Ser: 1.49 mg/dL — ABNORMAL HIGH (ref 0.76–1.27)
Globulin, Total: 2.3 g/dL (ref 1.5–4.5)
Glucose: 91 mg/dL (ref 70–99)
Potassium: 4.2 mmol/L (ref 3.5–5.2)
Sodium: 137 mmol/L (ref 134–144)
Total Protein: 6.8 g/dL (ref 6.0–8.5)
eGFR: 52 mL/min/1.73 — ABNORMAL LOW (ref >59)

## 2024-03-13 LAB — CBC WITH DIFFERENTIAL/PLATELET
Basophils Absolute: 0.1 x10E3/uL (ref 0.0–0.2)
Basos: 1 %
EOS (ABSOLUTE): 0.2 x10E3/uL (ref 0.0–0.4)
Eos: 2 %
Hematocrit: 34.4 % — ABNORMAL LOW (ref 37.5–51.0)
Hemoglobin: 11.9 g/dL — ABNORMAL LOW (ref 13.0–17.7)
Immature Grans (Abs): 0 x10E3/uL (ref 0.0–0.1)
Immature Granulocytes: 0 %
Lymphocytes Absolute: 4.3 x10E3/uL — ABNORMAL HIGH (ref 0.7–3.1)
Lymphs: 45 %
MCH: 31.9 pg (ref 26.6–33.0)
MCHC: 34.6 g/dL (ref 31.5–35.7)
MCV: 92 fL (ref 79–97)
Monocytes Absolute: 0.9 x10E3/uL (ref 0.1–0.9)
Monocytes: 9 %
Neutrophils Absolute: 4.2 x10E3/uL (ref 1.4–7.0)
Neutrophils: 43 %
Platelets: 188 x10E3/uL (ref 150–450)
RBC: 3.73 x10E6/uL — ABNORMAL LOW (ref 4.14–5.80)
RDW: 16.6 % — ABNORMAL HIGH (ref 11.6–15.4)
WBC: 9.8 x10E3/uL (ref 3.4–10.8)

## 2024-03-13 LAB — THYROID PANEL WITH TSH
Free Thyroxine Index: 1.6 (ref 1.2–4.9)
T3 Uptake Ratio: 25 % (ref 24–39)
T4, Total: 6.4 ug/dL (ref 4.5–12.0)
TSH: 96.2 u[IU]/mL — ABNORMAL HIGH (ref 0.450–4.500)

## 2024-03-13 LAB — LIPID PANEL
Chol/HDL Ratio: 3 ratio (ref 0.0–5.0)
Cholesterol, Total: 116 mg/dL (ref 100–199)
HDL: 39 mg/dL — ABNORMAL LOW (ref >39)
LDL Chol Calc (NIH): 58 mg/dL (ref 0–99)
Triglycerides: 101 mg/dL (ref 0–149)
VLDL Cholesterol Cal: 19 mg/dL (ref 5–40)

## 2024-03-13 LAB — VITAMIN B12: Vitamin B-12: 1093 pg/mL (ref 232–1245)

## 2024-03-17 ENCOUNTER — Ambulatory Visit: Payer: Medicare Other

## 2024-03-17 VITALS — BP 132/80 | HR 64 | Ht 68.0 in | Wt 146.0 lb

## 2024-03-17 DIAGNOSIS — Z0000 Encounter for general adult medical examination without abnormal findings: Secondary | ICD-10-CM | POA: Diagnosis not present

## 2024-03-17 NOTE — Patient Instructions (Addendum)
 Mr. [name] , Thank you for taking time out of your busy schedule to complete your Annual Wellness Visit with me. I enjoyed our conversation and look forward to speaking with you again next year. I, as well as your care team,  appreciate your ongoing commitment to your health goals. Please review the following plan we discussed and let me know if I can assist you in the future. Your Game plan/ To Do List    Referrals: If you haven't heard from the office you've been referred to, please reach out to them at the phone provided.   Follow up Visits: We will see or speak with you next year for your Next Medicare AWV with our clinical staff on 03/18/25 on 1:50p.m. Have you seen your provider in the last 6 months (3 months if uncontrolled diabetes)? Yes  Clinician Recommendations:  Aim for 30 minutes of exercise or brisk walking, 6-8 glasses of water , and 5 servings of fruits and vegetables each day.       This is a list of the screenings recommended for you:  Health Maintenance  Topic Date Due   Medicare Annual Wellness Visit  03/15/2024   COVID-19 Vaccine (2 - 2024-25 season) 03/28/2024*   Zoster (Shingles) Vaccine (1 of 2) 06/12/2024*   Pneumococcal Vaccine for high risk medical condition (1 of 2 - PCV) 08/30/2024*   Flu Shot  11/04/2024*   Screening for Lung Cancer  03/12/2025*   Pneumococcal Vaccine for age over 63 (1 of 2 - PCV) 03/12/2025*   Colon Cancer Screening  03/12/2025*   DTaP/Tdap/Td vaccine (3 - Td or Tdap) 08/30/2033   Hepatitis C Screening  Completed   HIV Screening  Completed   Hepatitis B Vaccine  Aged Out   HPV Vaccine  Aged Out   Meningitis B Vaccine  Aged Out  *Topic was postponed. The date shown is not the original due date.    Advanced directives: (Copy Requested) Please bring a copy of your health care power of attorney and living will to the office to be added to your chart at your convenience. You can mail to [facility] [address]  [place], [place] [address] or email to [email or web address] Advance Care Planning is important because it:  [x]  Makes sure you receive the medical care that is consistent with your values, goals, and preferences  [x]  It provides guidance to your family and loved ones and reduces their decisional burden about whether or not they are making the right decisions based on your wishes.  Follow the link provided in your after visit summary or read over the paperwork we have mailed to you to help you started getting your Advance Directives in place. If you need assistance in completing these, please reach out to us  so that we can help you!  See attachments for Preventive Care and Fall Prevention Tips.

## 2024-03-17 NOTE — Telephone Encounter (Signed)
 Patient aware and verbalized understanding.

## 2024-03-17 NOTE — Progress Notes (Signed)
 Subjective:   [name] is a 63 y.o. who presents for a Medicare Wellness preventive visit.  As a reminder, Annual Wellness Visits don't include a physical exam, and some assessments may be limited, especially if this visit is performed virtually. We may recommend an in-person follow-up visit with your provider if needed.  Visit Complete: Virtual I connected with  [name] on 03/17/24 by a audio enabled telemedicine application and verified that I am speaking with the correct person using two identifiers.  Patient Location: Home  Provider Location: Home Office  I discussed the limitations of evaluation and management by telemedicine. The patient expressed understanding and agreed to proceed.  Vital Signs: Because this visit was a virtual/telehealth visit, some criteria may be missing or patient reported. Any vitals not documented were not able to be obtained and vitals that have been documented are patient reported.  VideoDeclined- This patient declined Librarian, academic. Therefore the visit was completed with audio only.  Persons Participating in Visit: Patient.  AWV Questionnaire: No: Patient Medicare AWV questionnaire was not completed prior to this visit.  Cardiac Risk Factors include: advanced age (>87men, >32 women);dyslipidemia;hypertension;smoking/ tobacco exposure;male gender     Objective:    Today's Vitals   03/17/24 1325  BP: 132/80  Pulse: 64  Weight: 146 lb (66.2 kg)  Height: 5' 8 (1.727 m)   Body mass index is 22.2 kg/m.     03/17/2024    1:30 PM 03/16/2023   10:09 AM 03/09/2022    2:50 PM 09/16/2020   11:30 AM 02/26/2020    1:11 PM 05/06/2019   11:10 PM 05/04/2019    3:27 PM  Advanced Directives  Does Patient Have a Medical Advance Directive? Yes Yes Yes No No  No  Type of Advance Directive Living will Healthcare Power of [place];Living will Living will      Does patient want to make changes to medical advance  directive?   No - Patient declined      Copy of Healthcare Power of Attorney in Chart?  No - copy requested       Would patient like information on creating a medical advance directive?    No - Patient declined No - Patient declined  No - Patient declined     Information is confidential and restricted. Go to Review Flowsheets to unlock data.    Current Medications (verified) Outpatient Encounter Medications as of 03/17/2024  Medication Sig   amLODipine  (NORVASC ) 2.5 MG tablet Take 1 tablet (2.5 mg total) by mouth daily.   atorvastatin  (LIPITOR) 20 MG tablet Take 1 tablet (20 mg total) by mouth daily.   citalopram (CELEXA) 20 MG tablet Take 20 mg by mouth daily.   cyanocobalamin  (VITAMIN B12) 1000 MCG tablet Take 1,000 mcg by mouth daily.   hydrOXYzine  (VISTARIL ) 50 MG capsule Take 50 mg by mouth in the morning, at noon, and at bedtime.   levothyroxine  (SYNTHROID ) 150 MCG tablet Take 1 tablet (150 mcg total) by mouth daily.   QUEtiapine  (SEROQUEL ) 100 MG tablet Take 100 mg by mouth at bedtime.   Facility-Administered Encounter Medications as of 03/17/2024  Medication   gadopentetate dimeglumine  (MAGNEVIST ) injection 15 mL    Allergies (verified) Benadryl  [diphenhydramine ] and Diphenhydramine  hcl   History: Past Medical History:  Diagnosis Date   Allergy    Anxiety and depression    Arthritis    Headache    Hemorrhoids    Hyperlipidemia 02/06/2020   Hypertension  Hypothyroidism    OSA on CPAP    doesn't use often pt states    Pneumonia    Seizures (HCC) 02/2016   only with Benadryl  use   Suicide and self-inflicted injury (HCC)    Vitamin B12 deficiency 02/06/2020   Past Surgical History:  Procedure Laterality Date   ANTERIOR CERVICAL DECOMP/DISCECTOMY FUSION N/A 09/20/2020   Procedure: ANTERIOR CERVICAL DECOMPRESSION/DISCECTOMY FUSION CERVICAL THREE- CERVICAL FOUR;  Surgeon: Debby Dorn MATSU, MD;  Location: [facility] OR;  Service: Neurosurgery;  Laterality: N/A;   BICEPS  TENDON REPAIR Right    CARPAL TUNNEL RELEASE Bilateral    DECORTICATION Left 11/12/2018   Procedure: DECORTICATION;  Surgeon: Fleeta Ochoa, Maude, MD;  Location: [facility] OR;  Service: Thoracic;  Laterality: Left;   KNEE CARTILAGE SURGERY Left    KNEE SURGERY Right    x 2   LAPAROTOMY N/A 11/03/2018   Procedure: EXPLORATORY LAPAROTOMY;  Surgeon: Vanderbilt Debby, MD;  Location: [facility] OR;  Service: General;  Laterality: N/A;   VIDEO ASSISTED THORACOSCOPY (VATS)/EMPYEMA Left 11/12/2018   Procedure: VIDEO ASSISTED THORACOSCOPY (VATS)/EMPYEMA, MINI THORACOTOMY;  Surgeon: Fleeta Ochoa Maude, MD;  Location: [facility] OR;  Service: Thoracic;  Laterality: Left;  needs cell saver   Family History  Problem Relation Age of Onset   Kidney disease Mother    Hypertension Father    Multiple sclerosis Sister    Stroke Brother    Breast cancer Paternal Grandmother    Kidney failure Maternal Grandmother    Kidney disease Maternal Grandmother    Heart defect Maternal Uncle    Social History   Socioeconomic History   Marital status: Married    Spouse name: Arland   Number of children: 1   Years of education: 12   Highest education level: 12th grade  Occupational History   Occupation: Holiday representative    Comment: remodels houses   Occupation: Psychologist, 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   Tobacco Use   Smoking status: Every Day    Current packs/day: 1.50    Average packs/day: 1.5 packs/day for 56.6 years (84.9 ttl pk-yrs)    Types: Cigarettes    Start date: 1969   Smokeless tobacco: Never   Tobacco comments:    07/20/2023 Patient smokes a pack daily  Vaping Use   Vaping status: Never Used  Substance and Sexual Activity   Alcohol use: Not Currently    Comment: none since March 2020   Drug use: Not Currently    Frequency: 3.0 times per week    Types: Marijuana    Comment: 02/03/20- states no    Sexual activity: Not Currently    Partners: Female    Birth control/protection: None  Other Topics Concern   Not on  file  Social History Narrative   ** Merged History Encounter **       Lives with wife Caffeine- coffee 2 cups daily   Social Drivers of Health   Financial Resource Strain: Low Risk  (03/17/2024)   Overall Financial Resource Strain (CARDIA)    Difficulty of Paying Living Expenses: Not hard at all  Food Insecurity: No Food Insecurity (03/17/2024)   Hunger Vital Sign    Worried About Running Out of Food in the Last Year: Never true    Ran Out of Food in the Last Year: Never true  Transportation Needs: No Transportation Needs (03/17/2024)   PRAPARE - Administrator, Civil Service (Medical): No    Lack of Transportation (Non-Medical): No  Physical Activity: Inactive (03/17/2024)   Exercise Vital Sign    Days of Exercise per Week: 0 days    Minutes of Exercise per Session: 0 min  Stress: No Stress Concern Present (03/17/2024)   [employer] of Occupational Health - Occupational Stress Questionnaire    Feeling of Stress: Not at all  Social Connections: Socially Isolated (03/17/2024)   Social Connection and Isolation Panel    Frequency of Communication with Friends and Family: Once a week    Frequency of Social Gatherings with Friends and Family: Once a week    Attends Religious Services: Never    Database administrator or Organizations: No    Attends Engineer, structural: Never    Marital Status: Married    Tobacco Counseling Ready to quit: No Counseling given: Yes Tobacco comments: 07/20/2023 Patient smokes a pack daily    Clinical Intake:  Pre-visit preparation completed: Yes  Pain : No/denies pain     BMI - recorded: 22.2 Nutritional Status: BMI of 19-24  Normal Nutritional Risks: None Diabetes: No  Lab Results  Component Value Date   HGBA1C 4.9 12/10/2018     How often do you need to have someone help you when you read instructions, pamphlets, or other written materials from your doctor or pharmacy?: 1 - Never  Interpreter Needed?:  No  Information entered by :: alia t/cma   Activities of Daily Living     03/17/2024    1:29 PM  In your present state of health, do you have any difficulty performing the following activities:  Hearing? 0  Vision? 0  Difficulty concentrating or making decisions? 0  Walking or climbing stairs? 0  Dressing or bathing? 0  Doing errands, shopping? 1  Comment pt's wife  Quarry manager and eating ? N  Using the Toilet? N  In the past six months, have you accidently leaked urine? N  Do you have problems with loss of bowel control? N  Managing your Medications? N  Managing your Finances? Y  Comment pt's wife  Housekeeping or managing your Housekeeping? Y  Comment pt's wife    Patient Care Team: Rakes, Rock HERO, FNP as PCP - General (Family Medicine) Francyne Headland, MD as PCP - Cardiology (Cardiology) Debrah Josette ORN., PA-C (Family Medicine)  I have updated your Care Teams any recent Medical Services you may have received from other providers in the past year.     Assessment:   This is a routine wellness examination for [name].  Hearing/Vision screen Hearing Screening - Comments:: Pt denies hearing dif Vision Screening - Comments:: Pt wear glasses/pt goes [facility] In [place]/pt just had cataracts surgery    Goals Addressed             This Visit's Progress    Exercise 3x per week (30 min per time)   On track    Obtain Annual Eye Exam   On track    Patient to schedule eye exam.       Depression Screen     03/17/2024    1:34 PM 03/12/2024   11:08 AM 08/31/2023   10:10 AM 03/16/2023   10:08 AM 03/02/2023    9:10 AM 03/30/2022    3:17 PM 03/09/2022    3:04 PM  PHQ 2/9 Scores  PHQ - 2 Score 3 4 3  0 3 3 5   PHQ- 9 Score 8 11 10  0 8 6 13     Fall Risk     03/12/2024  11:08 AM 08/31/2023   10:10 AM 03/16/2023   10:07 AM 03/02/2023    9:10 AM 03/30/2022    3:17 PM  Fall Risk   Falls in the past year? 1 1 0 1 1  Number falls in past yr: 1 1 0 1 1  Injury with Fall?  0 0 0 0 0  Risk for fall due to : History of fall(s) History of fall(s) No Fall Risks    Follow up Falls evaluation completed Falls evaluation completed Falls prevention discussed Falls prevention discussed Falls prevention discussed      Data saved with a previous flowsheet row definition    MEDICARE RISK AT HOME:  Medicare Risk at Home Any stairs in or around the home?: Yes If so, are there any without handrails?: Yes Home free of loose throw rugs in walkways, pet beds, electrical cords, etc?: Yes Adequate lighting in your home to reduce risk of falls?: Yes Life alert?: No Use of a cane, walker or w/c?: No Grab bars in the bathroom?: No Shower chair or bench in shower?: No Elevated toilet seat or a handicapped toilet?: No  TIMED UP AND GO:  Was the test performed?    Cognitive Function: 6CIT completed    03/09/2022    2:54 PM  MMSE - Mini Mental State Exam  Orientation to time 5  Orientation to Place 5  Registration 3  Attention/ Calculation 5  Recall 2  Language- name 2 objects 2  Language- repeat 1  Language- follow 3 step command 3  Language- read & follow direction 1  Write a sentence 1  Copy design 1  Total score 29        03/17/2024    1:37 PM 03/16/2023   10:09 AM  6CIT Screen  What Year? 0 points 0 points  What month? 0 points 0 points  What time? 0 points 0 points  Count back from 20 0 points 0 points  Months in reverse 0 points 0 points  Repeat phrase 0 points 0 points  Total Score 0 points 0 points    Immunizations Immunization History  Administered Date(s) Administered   Influenza Split 06/19/2011   Influenza-Unspecified 06/19/2011   Janssen (J&J) SARS-COV-2 Vaccination 11/05/2019   Tdap 02/10/2009, 08/31/2023    Screening Tests Health Maintenance  Topic Date Due   COVID-19 Vaccine (2 - 2024-25 season) 03/28/2024 (Originally 04/08/2023)   Zoster Vaccines- Shingrix (1 of 2) 06/12/2024 (Originally 02/09/2010)   Pneumococcal Vaccine: 19-49 Years  (1 of 2 - PCV) 08/30/2024 (Originally 02/10/1979)   INFLUENZA VACCINE  11/04/2024 (Originally 03/07/2024)   Lung Cancer Screening  03/12/2025 (Originally 11/27/2019)   Pneumococcal Vaccine: 50+ Years (1 of 2 - PCV) 03/12/2025 (Originally 02/10/1979)   Colonoscopy  03/12/2025 (Originally 03/17/2021)   Medicare Annual Wellness (AWV)  03/17/2025   DTaP/Tdap/Td (3 - Td or Tdap) 08/30/2033   Hepatitis C Screening  Completed   HIV Screening  Completed   Hepatitis B Vaccines  Aged Out   HPV VACCINES  Aged Out   Meningococcal B Vaccine  Aged Out    Health Maintenance  There are no preventive care reminders to display for this patient.  Health Maintenance Items Addressed: See Nurse Notes at the end of this note  Additional Screening:  Vision Screening: Recommended annual ophthalmology exams for early detection of glaucoma and other disorders of the eye. Would you like a referral to an eye doctor? No    Dental Screening: Recommended annual dental exams for proper  oral hygiene  Community Resource Referral / Chronic Care Management: CRR required this visit?  No   CCM required this visit?  No   Plan:    I have personally reviewed and noted the following in the patient's chart:   Medical and social history Use of alcohol, tobacco or illicit drugs  Current medications and supplements including opioid prescriptions. Patient is not currently taking opioid prescriptions. Functional ability and status Nutritional status Physical activity Advanced directives List of other physicians Hospitalizations, surgeries, and ER visits in previous 12 months Vitals Screenings to include cognitive, depression, and falls Referrals and appointments  In addition, I have reviewed and discussed with patient certain preventive protocols, quality metrics, and best practice recommendations. A written personalized care plan for preventive services as well as general preventive health recommendations were provided  to patient.   [name], CMA   03/17/2024   After Visit Summary: (MyChart) Due to this being a telephonic visit, the after visit summary with patients personalized plan was offered to patient via MyChart   Notes: Nothing significant to report at this time.

## 2024-03-18 ENCOUNTER — Other Ambulatory Visit: Payer: Self-pay | Admitting: Family Medicine

## 2024-03-18 DIAGNOSIS — I10 Essential (primary) hypertension: Secondary | ICD-10-CM

## 2024-04-05 ENCOUNTER — Other Ambulatory Visit: Payer: Self-pay | Admitting: Family Medicine

## 2024-04-05 DIAGNOSIS — E039 Hypothyroidism, unspecified: Secondary | ICD-10-CM

## 2024-04-30 ENCOUNTER — Other Ambulatory Visit: Payer: Self-pay | Admitting: Family Medicine

## 2024-04-30 DIAGNOSIS — E782 Mixed hyperlipidemia: Secondary | ICD-10-CM

## 2024-04-30 DIAGNOSIS — I700 Atherosclerosis of aorta: Secondary | ICD-10-CM

## 2024-05-16 ENCOUNTER — Other Ambulatory Visit (HOSPITAL_COMMUNITY): Payer: Self-pay | Admitting: Nephrology

## 2024-05-16 DIAGNOSIS — D638 Anemia in other chronic diseases classified elsewhere: Secondary | ICD-10-CM

## 2024-05-16 DIAGNOSIS — I129 Hypertensive chronic kidney disease with stage 1 through stage 4 chronic kidney disease, or unspecified chronic kidney disease: Secondary | ICD-10-CM | POA: Diagnosis not present

## 2024-05-16 DIAGNOSIS — N1831 Chronic kidney disease, stage 3a: Secondary | ICD-10-CM

## 2024-05-16 DIAGNOSIS — E785 Hyperlipidemia, unspecified: Secondary | ICD-10-CM | POA: Diagnosis not present

## 2024-05-16 DIAGNOSIS — G4733 Obstructive sleep apnea (adult) (pediatric): Secondary | ICD-10-CM | POA: Diagnosis not present

## 2024-06-16 ENCOUNTER — Ambulatory Visit (INDEPENDENT_AMBULATORY_CARE_PROVIDER_SITE_OTHER): Admitting: "Endocrinology

## 2024-06-16 ENCOUNTER — Encounter: Payer: Self-pay | Admitting: "Endocrinology

## 2024-06-16 VITALS — BP 120/68 | HR 60 | Ht 68.0 in | Wt 135.2 lb

## 2024-06-16 DIAGNOSIS — E039 Hypothyroidism, unspecified: Secondary | ICD-10-CM | POA: Diagnosis not present

## 2024-06-16 MED ORDER — LEVOTHYROXINE SODIUM 100 MCG PO TABS: 100.0000 ug | ORAL_TABLET | Freq: Every day | ORAL | 0 refills | Status: DC

## 2024-06-16 MED ORDER — LEVOTHYROXINE SODIUM 100 MCG PO TABS: 150.0000 ug | ORAL_TABLET | Freq: Every day | ORAL | 1 refills | Status: DC

## 2024-06-16 NOTE — Progress Notes (Signed)
 Endocrinology Consult Note                                            06/16/2024, 2:39 PM   Subjective:    Patient ID: [name], male    DOB: 12-18-1960, PCP Severa Rock HERO, FNP   Past Medical History:  Diagnosis Date   Allergy    Anxiety and depression    Arthritis    Headache    Hemorrhoids    Hyperlipidemia 02/06/2020   Hypertension    Hypothyroidism    OSA on CPAP    doesn't use often pt states    Pneumonia    Seizures (HCC) 02/2016   only with Benadryl  use   Suicide and self-inflicted injury (HCC)    Vitamin B12 deficiency 02/06/2020   Past Surgical History:  Procedure Laterality Date   ANTERIOR CERVICAL DECOMP/DISCECTOMY FUSION N/A 09/20/2020   Procedure: ANTERIOR CERVICAL DECOMPRESSION/DISCECTOMY FUSION CERVICAL THREE- CERVICAL FOUR;  Surgeon: Debby Dorn MATSU, MD;  Location: [facility] OR;  Service: Neurosurgery;  Laterality: N/A;   BICEPS TENDON REPAIR Right    CARPAL TUNNEL RELEASE Bilateral    CATARACT EXTRACTION Bilateral 2024   DECORTICATION Left 11/12/2018   Procedure: DECORTICATION;  Surgeon: Fleeta Ochoa, Maude, MD;  Location: [facility] OR;  Service: Thoracic;  Laterality: Left;   KNEE CARTILAGE SURGERY Left    KNEE SURGERY Right    x 2   LAPAROTOMY N/A 11/03/2018   Procedure: EXPLORATORY LAPAROTOMY;  Surgeon: Vanderbilt Debby, MD;  Location: [facility] OR;  Service: General;  Laterality: N/A;   VIDEO ASSISTED THORACOSCOPY (VATS)/EMPYEMA Left 11/12/2018   Procedure: VIDEO ASSISTED THORACOSCOPY (VATS)/EMPYEMA, MINI THORACOTOMY;  Surgeon: Fleeta Ochoa Maude, MD;  Location: [facility] OR;  Service: Thoracic;  Laterality: Left;  needs cell saver   Social History   Socioeconomic History   Marital status: Married    Spouse name: Arland   Number of children: 1   Years of education: 12   Highest education level: 12th grade  Occupational History   Occupation: holiday representative    Comment: remodels houses   Occupation: psychologist, 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    Tobacco Use   Smoking status: Every Day    Current packs/day: 1.50    Average packs/day: 1.5 packs/day for 56.9 years (85.3 ttl pk-yrs)    Types: Cigarettes    Start date: 1969   Smokeless tobacco: Never   Tobacco comments:    07/20/2023 Patient smokes a pack daily  Vaping Use   Vaping status: Never Used  Substance and Sexual Activity   Alcohol use: Not Currently    Comment: none since March 2020   Drug use: Yes    Frequency: 3.0 times per week    Types: Marijuana    Comment: 02/03/20- states no    Sexual activity: Not Currently    Partners: Female    Birth control/protection: None  Other Topics Concern   Not on file  Social History Narrative   ** Merged History Encounter **       Lives with wife Caffeine- coffee 2 cups daily   Social Drivers of Health   Financial Resource Strain: Low Risk  (03/17/2024)   Overall Financial Resource Strain (CARDIA)    Difficulty of Paying Living Expenses: Not hard at all  Food Insecurity: No Food Insecurity (03/17/2024)  Hunger Vital Sign    Worried About Running Out of Food in the Last Year: Never true    Ran Out of Food in the Last Year: Never true  Transportation Needs: No Transportation Needs (03/17/2024)   PRAPARE - Administrator, Civil Service (Medical): No    Lack of Transportation (Non-Medical): No  Physical Activity: Inactive (03/17/2024)   Exercise Vital Sign    Days of Exercise per Week: 0 days    Minutes of Exercise per Session: 0 min  Stress: No Stress Concern Present (03/17/2024)   [employer] of Occupational Health - Occupational Stress Questionnaire    Feeling of Stress: Not at all  Social Connections: Socially Isolated (03/17/2024)   Social Connection and Isolation Panel    Frequency of Communication with Friends and Family: Once a week    Frequency of Social Gatherings with Friends and Family: Once a week    Attends Religious Services: Never    Database Administrator or Organizations: No    Attends  Engineer, Structural: Never    Marital Status: Married   Family History  Problem Relation Age of Onset   Kidney disease Mother    Hypertension Father    Multiple sclerosis Sister    Stroke Brother    Breast cancer Paternal Grandmother    Kidney failure Maternal Grandmother    Kidney disease Maternal Grandmother    Heart defect Maternal Uncle    Outpatient Encounter Medications as of 06/16/2024  Medication Sig   amLODipine  (NORVASC ) 2.5 MG tablet TAKE 1 TABLET BY MOUTH DAILY (Patient taking differently: Take 5 mg by mouth 2 (two) times daily.)   hydrOXYzine  (VISTARIL ) 50 MG capsule Take 50 mg by mouth in the morning, at noon, and at bedtime. (Patient taking differently: Take 50 mg by mouth 4 (four) times daily.)   atorvastatin  (LIPITOR) 20 MG tablet TAKE 1 TABLET BY MOUTH DAILY   citalopram (CELEXA) 20 MG tablet Take 20 mg by mouth daily.   cyanocobalamin  (VITAMIN B12) 1000 MCG tablet Take 1,000 mcg by mouth daily.   levothyroxine  (SYNTHROID ) 100 MCG tablet Take 1 tablet (100 mcg total) by mouth daily before breakfast.   QUEtiapine  (SEROQUEL ) 100 MG tablet Take 100 mg by mouth at bedtime.   [DISCONTINUED] levothyroxine  (SYNTHROID ) 100 MCG tablet Take 1.5 tablets (150 mcg total) by mouth daily before breakfast.   [DISCONTINUED] levothyroxine  (SYNTHROID ) 150 MCG tablet TAKE 1 TABLET BY MOUTH DAILY   Facility-Administered Encounter Medications as of 06/16/2024  Medication   gadopentetate dimeglumine  (MAGNEVIST ) injection 15 mL   ALLERGIES: Allergies  Allergen Reactions   Benadryl  [Diphenhydramine ] Other (See Comments)    throws me for a loop. Seizure   Diphenhydramine  Hcl Other (See Comments)    VACCINATION STATUS: Immunization History  Administered Date(s) Administered   Influenza Split 06/19/2011   Influenza-Unspecified 06/19/2011   Janssen (J&J) SARS-COV-2 Vaccination 11/05/2019   Tdap 02/10/2009, 08/31/2023    HPI [name] is 63 y.o. male who  presents today with a medical history as above. he is being seen in consultation for hypothyroidism requested by Severa Rock HERO, FNP. Patient is not an optimal historian.  He reports that he was diagnosed with hypothyroidism for more than 10 years ago treated with various dose of levothyroxine , currently on 150 mcg p.o. daily.  He admittedly is not consistent taking his medication.  He has symptom complex including anxiety, palpitations, cold intolerance, constipation depending on how consistent he is taking his thyroid  hormone.  He denies any thyroid  surgery nor ablation.  He denies any family history of thyroid  dysfunction.   Review of Systems  Constitutional: +mildly fluctuating body weight,  + fatigue, no subjective hyperthermia, no subjective hypothermia Eyes: no blurry vision, no xerophthalmia ENT: no sore throat, no nodules palpated in throat, no dysphagia/odynophagia, no hoarseness Cardiovascular: no Chest Pain, no Shortness of Breath, no palpitations, no leg swelling Respiratory: no cough, no shortness of breath Gastrointestinal: no Nausea/Vomiting/Diarhhea Musculoskeletal: no muscle/joint aches Skin: no rashes Neurological: no tremors, no numbness, no tingling, no dizziness Psychiatric: no depression, no anxiety  Objective:       06/16/2024    2:14 PM 03/17/2024    1:25 PM 03/12/2024   11:07 AM  Vitals with BMI  Height 5' 8 5' 8 5' 8  Weight 135 lbs 3 oz 146 lbs 146 lbs  BMI 20.56 22.2 22.2  Systolic 120 132 867  Diastolic 68 80 80  Pulse 60 64 64    BP 120/68   Pulse 60   Ht 5' 8 (1.727 m)   Wt 135 lb 3.2 oz (61.3 kg)   BMI 20.56 kg/m   Wt Readings from Last 3 Encounters:  06/16/24 135 lb 3.2 oz (61.3 kg)  03/17/24 146 lb (66.2 kg)  03/12/24 146 lb (66.2 kg)    Physical Exam  Constitutional:  Body mass index is 20.56 kg/m.,  not in acute distress, normal state of mind Eyes: PERRLA, EOMI, no exophthalmos ENT: moist mucous membranes, no gross thyromegaly, no  gross cervical lymphadenopathy Cardiovascular: normal precordial activity, Regular Rate and Rhythm, no Murmur/Rubs/Gallops Respiratory:  adequate breathing efforts, no gross chest deformity, Clear to auscultation bilaterally Gastrointestinal: abdomen soft, Non -tender, No distension, Bowel Sounds present, no gross organomegaly Musculoskeletal: no gross deformities, strength intact in all four extremities, no peripheral edema Skin: moist, warm, no rashes Neurological: no tremor with outstretched hands, Deep tendon reflexes normal in bilateral lower extremities.  CMP ( most recent) CMP     Component Value Date/Time   NA 137 03/12/2024 1125   K 4.2 03/12/2024 1125   CL 102 03/12/2024 1125   CO2 21 03/12/2024 1125   GLUCOSE 91 03/12/2024 1125   GLUCOSE 93 09/16/2020 1204   BUN 15 03/12/2024 1125   CREATININE 1.49 (H) 03/12/2024 1125   CALCIUM  9.1 03/12/2024 1125   PROT 6.8 03/12/2024 1125   ALBUMIN  4.5 03/12/2024 1125   AST 24 03/12/2024 1125   ALT 15 03/12/2024 1125   ALKPHOS 100 03/12/2024 1125   BILITOT 0.3 03/12/2024 1125   EGFR 52 (L) 03/12/2024 1125   GFRNONAA >60 09/16/2020 1204     Diabetic Labs (most recent): Lab Results  Component Value Date   HGBA1C 4.9 12/10/2018     Lipid Panel ( most recent) Lipid Panel     Component Value Date/Time   CHOL 116 03/12/2024 1125   TRIG 101 03/12/2024 1125   HDL 39 (L) 03/12/2024 1125   CHOLHDL 3.0 03/12/2024 1125   CHOLHDL 5.3 12/10/2018 0729   VLDL 30 12/10/2018 0729   LDLCALC 58 03/12/2024 1125   LABVLDL 19 03/12/2024 1125      Lab Results  Component Value Date   TSH 96.200 (H) 03/12/2024   TSH 11.200 (H) 08/31/2023   TSH 26.700 (H) 03/02/2023   TSH 15.900 (H) 08/04/2022   TSH 0.015 (L) 03/30/2022   FREET4 0.99 08/04/2022   FREET4 2.75 (H) 03/30/2022   FREET4 2.20 (H) 09/29/2021   FREET4 1.43 02/18/2021  FREET4 1.66 12/31/2020           Assessment & Plan:   1. Hypothyroidism, unspecified type  (Primary)     - [name]  is being seen at a kind request of Rakes, Rock HERO, FNP. - I have reviewed his available thyroid  records and clinically evaluated the patient. - Based on these reviews, he has longstanding hypothyroidism,  however,  there is not sufficient information to proceed with definitive treatment plan. -Considering he is a light weight, 150 mcg of levothyroxine  seems to be too much. I counseled him on consistency and prescribed low-dose of levothyroxine  100 mcg p.o. daily before breakfast.   - We discussed about the correct intake of his thyroid  hormone, on empty stomach at fasting, with water , separated by at least 30 minutes from breakfast and other medications,  and separated by more than 4 hours from calcium , iron , multivitamins, acid reflux medications (PPIs). -Patient is made aware of the fact that thyroid  hormone replacement is needed for life, dose to be adjusted by periodic monitoring of thyroid  function tests. -His next labs will include antithyroid antibodies to help diagnose the etiology behind his hypothyroidism. - TSH - T4, free - Thyroid  peroxidase antibody - Thyroglobulin antibody  - he is advised to maintain close follow up with Rakes, Rock HERO, FNP for primary care needs.   -Thank you for involving me in the care of this pleasant patient.  Time spent with the patient: 45  minutes spent in  counseling him about hypothyroidism and the rest in obtaining information about his symptoms, reviewing his previous labs/studies (including abstractions from other facilities),  evaluations, and treatments,  and developing a plan to confirm diagnosis and long term treatment based on the latest standards of care/guidelines; and documenting his care.  [name] participated in the discussions, expressed understanding, and voiced agreement with the above plans.  All questions were answered to his satisfaction. he is encouraged to contact clinic should he have  any questions or concerns prior to his return visit.  Follow up plan: Return in about 7 weeks (around 08/04/2024) for F/U with Pre-visit Labs.   Ranny Earl, MD [facility] [address] [place], [place] [address] Phone: [phone]  Fax: [phone]     06/16/2024, 2:39 PM  This note was partially dictated with voice recognition software. Similar sounding words can be transcribed inadequately or may not  be corrected upon review.

## 2024-06-17 ENCOUNTER — Other Ambulatory Visit: Payer: Self-pay | Admitting: "Endocrinology

## 2024-06-17 DIAGNOSIS — E039 Hypothyroidism, unspecified: Secondary | ICD-10-CM

## 2024-07-30 LAB — THYROGLOBULIN ANTIBODY: Thyroglobulin Antibody: 110.4 [IU]/mL — ABNORMAL HIGH (ref 0.0–0.9)

## 2024-07-30 LAB — TSH: TSH: 199 u[IU]/mL — ABNORMAL HIGH (ref 0.450–4.500)

## 2024-07-30 LAB — THYROID PEROXIDASE ANTIBODY: Thyroperoxidase Ab SerPl-aCnc: 75 [IU]/mL — ABNORMAL HIGH (ref 0–34)

## 2024-07-30 LAB — T4, FREE: Free T4: 0.59 ng/dL — ABNORMAL LOW (ref 0.82–1.77)

## 2024-08-05 ENCOUNTER — Encounter: Payer: Self-pay | Admitting: "Endocrinology

## 2024-08-05 ENCOUNTER — Ambulatory Visit (INDEPENDENT_AMBULATORY_CARE_PROVIDER_SITE_OTHER): Admitting: "Endocrinology

## 2024-08-05 VITALS — BP 135/85 | HR 64 | Resp 18 | Ht 68.0 in | Wt 137.8 lb

## 2024-08-05 DIAGNOSIS — E039 Hypothyroidism, unspecified: Secondary | ICD-10-CM

## 2024-08-05 MED ORDER — LEVOTHYROXINE SODIUM 125 MCG PO TABS: 125.0000 ug | ORAL_TABLET | Freq: Every day | ORAL | 1 refills | Status: AC

## 2024-08-05 NOTE — Progress Notes (Signed)
 "                                                      08/05/2024, 2:39 PM  Endocrinology follow-up note   Subjective:    Patient ID: [name], male    DOB: Mar 27, 1961, PCP Severa Rock HERO, FNP   Past Medical History:  Diagnosis Date   Allergy    Anxiety and depression    Arthritis    Headache    Hemorrhoids    Hyperlipidemia 02/06/2020   Hypertension    Hypothyroidism    OSA on CPAP    doesn't use often pt states    Pneumonia    Seizures (HCC) 02/2016   only with Benadryl  use   Suicide and self-inflicted injury (HCC)    Vitamin B12 deficiency 02/06/2020   Past Surgical History:  Procedure Laterality Date   ANTERIOR CERVICAL DECOMP/DISCECTOMY FUSION N/A 09/20/2020   Procedure: ANTERIOR CERVICAL DECOMPRESSION/DISCECTOMY FUSION CERVICAL THREE- CERVICAL FOUR;  Surgeon: Debby Dorn MATSU, MD;  Location: [facility] OR;  Service: Neurosurgery;  Laterality: N/A;   BICEPS TENDON REPAIR Right    CARPAL TUNNEL RELEASE Bilateral    CATARACT EXTRACTION Bilateral 2024   DECORTICATION Left 11/12/2018   Procedure: DECORTICATION;  Surgeon: Fleeta Ochoa, Maude, MD;  Location: [facility] OR;  Service: Thoracic;  Laterality: Left;   KNEE CARTILAGE SURGERY Left    KNEE SURGERY Right    x 2   LAPAROTOMY N/A 11/03/2018   Procedure: EXPLORATORY LAPAROTOMY;  Surgeon: Vanderbilt Debby, MD;  Location: [facility] OR;  Service: General;  Laterality: N/A;   VIDEO ASSISTED THORACOSCOPY (VATS)/EMPYEMA Left 11/12/2018   Procedure: VIDEO ASSISTED THORACOSCOPY (VATS)/EMPYEMA, MINI THORACOTOMY;  Surgeon: Fleeta Ochoa Maude, MD;  Location: [facility] OR;  Service: Thoracic;  Laterality: Left;  needs cell saver   Social History   Socioeconomic History   Marital status: Married    Spouse name: Arland   Number of children: 1   Years of education: 12   Highest education level: 12th grade  Occupational History   Occupation: holiday representative    Comment: remodels houses   Occupation: psychologist, 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    Tobacco Use   Smoking status: Every Day    Current packs/day: 1.50    Average packs/day: 1.5 packs/day for 57.0 years (85.5 ttl pk-yrs)    Types: Cigarettes    Start date: 1969   Smokeless tobacco: Never   Tobacco comments:    07/20/2023 Patient smokes a pack daily  Vaping Use   Vaping status: Never Used  Substance and Sexual Activity   Alcohol use: Not Currently    Comment: none since March 2020   Drug use: Yes    Frequency: 3.0 times per week    Types: Marijuana    Comment: 02/03/20- states no    Sexual activity: Not Currently    Partners: Female    Birth control/protection: None  Other Topics Concern   Not on file  Social History Narrative   ** Merged History Encounter **       Lives with wife Caffeine- coffee 2 cups daily   Social Drivers of Health   Tobacco Use: High Risk (08/05/2024)   Patient History    Smoking Tobacco Use: Every Day    Smokeless Tobacco Use: Never    Passive  Exposure: Not on file  Financial Resource Strain: Low Risk (03/17/2024)   Overall Financial Resource Strain (CARDIA)    Difficulty of Paying Living Expenses: Not hard at all  Food Insecurity: No Food Insecurity (03/17/2024)   Epic    Worried About Programme Researcher, Broadcasting/film/video in the Last Year: Never true    Ran Out of Food in the Last Year: Never true  Transportation Needs: No Transportation Needs (03/17/2024)   Epic    Lack of Transportation (Medical): No    Lack of Transportation (Non-Medical): No  Physical Activity: Inactive (03/17/2024)   Exercise Vital Sign    Days of Exercise per Week: 0 days    Minutes of Exercise per Session: 0 min  Stress: No Stress Concern Present (03/17/2024)   [employer] of Occupational Health - Occupational Stress Questionnaire    Feeling of Stress: Not at all  Social Connections: Socially Isolated (03/17/2024)   Social Connection and Isolation Panel    Frequency of Communication with Friends and Family: Once a week    Frequency of Social Gatherings with  Friends and Family: Once a week    Attends Religious Services: Never    Database Administrator or Organizations: No    Attends Banker Meetings: Never    Marital Status: Married  Depression (PHQ2-9): Medium Risk (03/17/2024)   Depression (PHQ2-9)    PHQ-2 Score: 8  Alcohol Screen: Low Risk (03/17/2024)   Alcohol Screen    Last Alcohol Screening Score (AUDIT): 0  Housing: Unknown (03/17/2024)   Epic    Unable to Pay for Housing in the Last Year: No    Number of Times Moved in the Last Year: Not on file    Homeless in the Last Year: No  Utilities: Not At Risk (03/17/2024)   Epic    Threatened with loss of utilities: No  Health Literacy: Adequate Health Literacy (03/17/2024)   B1300 Health Literacy    Frequency of need for help with medical instructions: Never   Family History  Problem Relation Age of Onset   Kidney disease Mother    Hypertension Father    Multiple sclerosis Sister    Stroke Brother    Breast cancer Paternal Grandmother    Kidney failure Maternal Grandmother    Kidney disease Maternal Grandmother    Heart defect Maternal Uncle    Outpatient Encounter Medications as of 08/05/2024  Medication Sig   amLODipine  (NORVASC ) 2.5 MG tablet TAKE 1 TABLET BY MOUTH DAILY (Patient taking differently: Take 5 mg by mouth 2 (two) times daily.)   atorvastatin  (LIPITOR) 20 MG tablet TAKE 1 TABLET BY MOUTH DAILY   citalopram (CELEXA) 20 MG tablet Take 20 mg by mouth daily.   cyanocobalamin  (VITAMIN B12) 1000 MCG tablet Take 1,000 mcg by mouth daily.   hydrOXYzine  (VISTARIL ) 50 MG capsule Take 50 mg by mouth in the morning, at noon, and at bedtime. (Patient taking differently: Take 50 mg by mouth 4 (four) times daily.)   QUEtiapine  (SEROQUEL ) 100 MG tablet Take 100 mg by mouth at bedtime.   [DISCONTINUED] levothyroxine  (SYNTHROID ) 100 MCG tablet Take 1 tablet (100 mcg total) by mouth daily before breakfast.   levothyroxine  (SYNTHROID ) 125 MCG tablet Take 1 tablet (125 mcg  total) by mouth daily before breakfast.   Facility-Administered Encounter Medications as of 08/05/2024  Medication   gadopentetate dimeglumine  (MAGNEVIST ) injection 15 mL   ALLERGIES: Allergies  Allergen Reactions   Benadryl  [Diphenhydramine ] Other (See Comments)    throws  me for a loop. Seizure   Diphenhydramine  Hcl Other (See Comments)    VACCINATION STATUS: Immunization History  Administered Date(s) Administered   Influenza Split 06/19/2011   Influenza-Unspecified 06/19/2011   Janssen (J&J) SARS-COV-2 Vaccination 11/05/2019   Tdap 02/10/2009, 08/31/2023    HPI [name] is 62 y.o. male who presents today with a medical history as above. he is being seen in follow-up after he was seen in consultation for hypothyroidism requested by Severa Rock HERO, FNP. Patient is not an optimal historian.  He reports that he was diagnosed with hypothyroidism for more than 10 years ago treated with various dose of levothyroxine , currently on 100 mcg p.o. daily.  He admittedly is not consistent taking his medication.  He misses 50% of his doses.  His previsit labs are consistent with inadequate replacement. He denies any thyroid  surgery nor ablation.  He denies any family history of thyroid  dysfunction.   Review of Systems  Constitutional: +mildly fluctuating body weight,  + fatigue, no subjective hyperthermia, no subjective hypothermia Eyes: no blurry vision, no xerophthalmia   Objective:       08/05/2024    2:03 PM 06/16/2024    2:14 PM 03/17/2024    1:25 PM  Vitals with BMI  Height 5' 8 5' 8 5' 8  Weight 137 lbs 13 oz 135 lbs 3 oz 146 lbs  BMI 20.96 20.56 22.2  Systolic 135 120 867  Diastolic 85 68 80  Pulse 64 60 64    BP 135/85   Pulse 64   Resp 18   Ht 5' 8 (1.727 m)   Wt 137 lb 12.8 oz (62.5 kg)   SpO2 100%   BMI 20.95 kg/m   Wt Readings from Last 3 Encounters:  08/05/24 137 lb 12.8 oz (62.5 kg)  06/16/24 135 lb 3.2 oz (61.3 kg)  03/17/24 146 lb (66.2  kg)    Physical Exam  Constitutional:  Body mass index is 20.95 kg/m.,  not in acute distress, normal state of mind Eyes: PERRLA, EOMI, no exophthalmos ENT: moist mucous membranes, no gross thyromegaly, no gross cervical lymphadenopathy   CMP ( most recent) CMP     Component Value Date/Time   NA 137 03/12/2024 1125   K 4.2 03/12/2024 1125   CL 102 03/12/2024 1125   CO2 21 03/12/2024 1125   GLUCOSE 91 03/12/2024 1125   GLUCOSE 93 09/16/2020 1204   BUN 15 03/12/2024 1125   CREATININE 1.49 (H) 03/12/2024 1125   CALCIUM  9.1 03/12/2024 1125   PROT 6.8 03/12/2024 1125   ALBUMIN  4.5 03/12/2024 1125   AST 24 03/12/2024 1125   ALT 15 03/12/2024 1125   ALKPHOS 100 03/12/2024 1125   BILITOT 0.3 03/12/2024 1125   EGFR 52 (L) 03/12/2024 1125   GFRNONAA >60 09/16/2020 1204     Diabetic Labs (most recent): Lab Results  Component Value Date   HGBA1C 4.9 12/10/2018     Lipid Panel ( most recent) Lipid Panel     Component Value Date/Time   CHOL 116 03/12/2024 1125   TRIG 101 03/12/2024 1125   HDL 39 (L) 03/12/2024 1125   CHOLHDL 3.0 03/12/2024 1125   CHOLHDL 5.3 12/10/2018 0729   VLDL 30 12/10/2018 0729   LDLCALC 58 03/12/2024 1125   LABVLDL 19 03/12/2024 1125      Lab Results  Component Value Date   TSH 199.000 (H) 07/29/2024   TSH 96.200 (H) 03/12/2024   TSH 11.200 (H) 08/31/2023   TSH 26.700 (  H) 03/02/2023   TSH 15.900 (H) 08/04/2022   FREET4 0.59 (L) 07/29/2024   FREET4 0.99 08/04/2022   FREET4 2.75 (H) 03/30/2022   FREET4 2.20 (H) 09/29/2021   FREET4 1.43 02/18/2021           Assessment & Plan:   1. Hypothyroidism 2.  Hashimoto's thyroiditis   - [name]  is being seen at a kind request of Rakes, Rock HERO, FNP. - I have reviewed his available thyroid  records and clinically evaluated the patient. - Based on these reviews, he has longstanding hypothyroidism now confirmed to be due to Hashimoto's thyroiditis,  however, treatment inconsistency is  the reason behind the fluctuating thyroid  function tests.   -Considering he is a light weight, I discussed and increase his levothyroxine  slightly to 125 mcg p.o. daily before breakfast.    - We discussed about the correct intake of his thyroid  hormone, on empty stomach at fasting, with water , separated by at least 30 minutes from breakfast and other medications,  and separated by more than 4 hours from calcium , iron , multivitamins, acid reflux medications (PPIs). -Patient is made aware of the fact that thyroid  hormone replacement is needed for life, dose to be adjusted by periodic monitoring of thyroid  function tests.    - he is advised to maintain close follow up with Rakes, Rock HERO, FNP for primary care needs.   I spent  20  minutes in the care of the patient today including review of labs from Thyroid  Function, CMP, and other relevant labs ; imaging/biopsy records (current and previous including abstractions from other facilities); face-to-face time discussing  his lab results and symptoms, medications doses, his options of short and long term treatment based on the latest standards of care / guidelines;   and documenting the encounter.  [name]  participated in the discussions, expressed understanding, and voiced agreement with the above plans.  All questions were answered to his satisfaction. he is encouraged to contact clinic should he have any questions or concerns prior to his return visit.   Follow up plan: Return in about 4 months (around 12/04/2024) for F/U with Pre-visit Labs.   Ranny Earl, MD [facility] [address] [place], [place] [address] Phone: [phone]  Fax: [phone]     08/05/2024, 2:39 PM  This note was partially dictated with voice recognition software. Similar sounding words can be transcribed inadequately or may not  be corrected upon review.  "

## 2024-09-04 ENCOUNTER — Ambulatory Visit (HOSPITAL_COMMUNITY)
Admission: RE | Admit: 2024-09-04 | Discharge: 2024-09-04 | Disposition: A | Discharge status: Home / Self Care | Source: Ambulatory Visit | Attending: Nephrology | Admitting: Nephrology

## 2024-09-04 DIAGNOSIS — N1831 Chronic kidney disease, stage 3a: Secondary | ICD-10-CM | POA: Insufficient documentation

## 2024-09-04 DIAGNOSIS — D638 Anemia in other chronic diseases classified elsewhere: Secondary | ICD-10-CM | POA: Insufficient documentation

## 2024-09-04 DIAGNOSIS — I129 Hypertensive chronic kidney disease with stage 1 through stage 4 chronic kidney disease, or unspecified chronic kidney disease: Secondary | ICD-10-CM | POA: Insufficient documentation

## 2024-09-12 ENCOUNTER — Encounter: Payer: Self-pay | Admitting: Family Medicine

## 2024-09-12 DIAGNOSIS — E538 Deficiency of other specified B group vitamins: Secondary | ICD-10-CM

## 2024-09-12 DIAGNOSIS — I700 Atherosclerosis of aorta: Secondary | ICD-10-CM

## 2024-09-12 DIAGNOSIS — I10 Essential (primary) hypertension: Secondary | ICD-10-CM

## 2024-09-12 DIAGNOSIS — E782 Mixed hyperlipidemia: Secondary | ICD-10-CM

## 2024-12-04 ENCOUNTER — Ambulatory Visit: Admitting: "Endocrinology

## 2025-03-18 ENCOUNTER — Ambulatory Visit: Payer: Self-pay
